# Patient Record
Sex: Male | Born: 1940 | Race: White | Hispanic: No | Smoking: Former smoker
Health system: Southern US, Community
[De-identification: ages and names within clinical notes are randomized; demographics above are authoritative.]

## PROBLEM LIST (undated history)

## (undated) DIAGNOSIS — D509 Iron deficiency anemia, unspecified: Secondary | ICD-10-CM

## (undated) DIAGNOSIS — Z923 Personal history of irradiation: Secondary | ICD-10-CM

## (undated) DIAGNOSIS — E785 Hyperlipidemia, unspecified: Secondary | ICD-10-CM

## (undated) DIAGNOSIS — I2510 Atherosclerotic heart disease of native coronary artery without angina pectoris: Secondary | ICD-10-CM

## (undated) DIAGNOSIS — G4700 Insomnia, unspecified: Secondary | ICD-10-CM

## (undated) DIAGNOSIS — N189 Chronic kidney disease, unspecified: Secondary | ICD-10-CM

## (undated) DIAGNOSIS — M109 Gout, unspecified: Secondary | ICD-10-CM

## (undated) DIAGNOSIS — E079 Disorder of thyroid, unspecified: Secondary | ICD-10-CM

## (undated) DIAGNOSIS — M549 Dorsalgia, unspecified: Secondary | ICD-10-CM

## (undated) DIAGNOSIS — Z972 Presence of dental prosthetic device (complete) (partial): Secondary | ICD-10-CM

## (undated) DIAGNOSIS — IMO0001 Reserved for inherently not codable concepts without codable children: Secondary | ICD-10-CM

## (undated) DIAGNOSIS — K219 Gastro-esophageal reflux disease without esophagitis: Secondary | ICD-10-CM

## (undated) DIAGNOSIS — I10 Essential (primary) hypertension: Secondary | ICD-10-CM

## (undated) DIAGNOSIS — E039 Hypothyroidism, unspecified: Secondary | ICD-10-CM

## (undated) DIAGNOSIS — M069 Rheumatoid arthritis, unspecified: Secondary | ICD-10-CM

## (undated) DIAGNOSIS — H30893 Other chorioretinal inflammations, bilateral: Secondary | ICD-10-CM

## (undated) DIAGNOSIS — H3093 Unspecified chorioretinal inflammation, bilateral: Secondary | ICD-10-CM

## (undated) DIAGNOSIS — G8929 Other chronic pain: Secondary | ICD-10-CM

## (undated) DIAGNOSIS — C801 Malignant (primary) neoplasm, unspecified: Secondary | ICD-10-CM

## (undated) DIAGNOSIS — F419 Anxiety disorder, unspecified: Secondary | ICD-10-CM

## (undated) DIAGNOSIS — H9190 Unspecified hearing loss, unspecified ear: Secondary | ICD-10-CM

## (undated) DIAGNOSIS — K759 Inflammatory liver disease, unspecified: Secondary | ICD-10-CM

## (undated) DIAGNOSIS — D649 Anemia, unspecified: Secondary | ICD-10-CM

## (undated) DIAGNOSIS — I209 Angina pectoris, unspecified: Secondary | ICD-10-CM

## (undated) DIAGNOSIS — G4730 Sleep apnea, unspecified: Secondary | ICD-10-CM

## (undated) DIAGNOSIS — K08109 Complete loss of teeth, unspecified cause, unspecified class: Secondary | ICD-10-CM

## (undated) HISTORY — PX: SKIN GRAFT: SHX250

## (undated) HISTORY — DX: Insomnia, unspecified: G47.00

## (undated) HISTORY — PX: MULTIPLE TOOTH EXTRACTIONS: SHX2053

## (undated) HISTORY — PX: TONSILLECTOMY: SUR1361

## (undated) HISTORY — DX: Hypothyroidism, unspecified: E03.9

## (undated) HISTORY — PX: FRACTURE SURGERY: SHX138

## (undated) HISTORY — PX: SKIN CANCER EXCISION: SHX779

## (undated) HISTORY — DX: Hyperlipidemia, unspecified: E78.5

---
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
## 2000-02-23 ENCOUNTER — Encounter: Payer: Self-pay | Admitting: Internal Medicine

## 2000-02-23 ENCOUNTER — Ambulatory Visit (HOSPITAL_COMMUNITY): Admission: RE | Admit: 2000-02-23 | Discharge: 2000-02-23 | Payer: Self-pay | Admitting: Internal Medicine

## 2000-03-16 ENCOUNTER — Ambulatory Visit (HOSPITAL_COMMUNITY): Admission: RE | Admit: 2000-03-16 | Discharge: 2000-03-16 | Payer: Self-pay | Admitting: *Deleted

## 2000-03-16 ENCOUNTER — Encounter (INDEPENDENT_AMBULATORY_CARE_PROVIDER_SITE_OTHER): Payer: Self-pay | Admitting: Specialist

## 2000-03-17 ENCOUNTER — Encounter: Payer: Self-pay | Admitting: *Deleted

## 2000-03-17 ENCOUNTER — Ambulatory Visit (HOSPITAL_COMMUNITY): Admission: RE | Admit: 2000-03-17 | Discharge: 2000-03-17 | Payer: Self-pay | Admitting: *Deleted

## 2000-05-02 ENCOUNTER — Encounter (INDEPENDENT_AMBULATORY_CARE_PROVIDER_SITE_OTHER): Payer: Self-pay

## 2000-05-02 ENCOUNTER — Ambulatory Visit (HOSPITAL_COMMUNITY): Admission: RE | Admit: 2000-05-02 | Discharge: 2000-05-02 | Payer: Self-pay | Admitting: *Deleted

## 2000-06-09 ENCOUNTER — Ambulatory Visit (HOSPITAL_COMMUNITY): Admission: RE | Admit: 2000-06-09 | Discharge: 2000-06-09 | Payer: Self-pay | Admitting: *Deleted

## 2000-06-09 ENCOUNTER — Encounter: Payer: Self-pay | Admitting: *Deleted

## 2000-08-15 ENCOUNTER — Encounter: Payer: Self-pay | Admitting: Specialist

## 2000-08-15 ENCOUNTER — Ambulatory Visit (HOSPITAL_COMMUNITY): Admission: RE | Admit: 2000-08-15 | Discharge: 2000-08-15 | Payer: Self-pay | Admitting: Specialist

## 2000-11-07 HISTORY — PX: CORONARY ANGIOPLASTY WITH STENT PLACEMENT: SHX49

## 2001-06-20 ENCOUNTER — Ambulatory Visit (HOSPITAL_COMMUNITY): Admission: RE | Admit: 2001-06-20 | Discharge: 2001-06-21 | Payer: Self-pay | Admitting: Cardiology

## 2001-06-30 ENCOUNTER — Emergency Department (HOSPITAL_COMMUNITY): Admission: EM | Admit: 2001-06-30 | Discharge: 2001-06-30 | Payer: Self-pay | Admitting: Emergency Medicine

## 2003-01-02 ENCOUNTER — Ambulatory Visit (HOSPITAL_COMMUNITY): Admission: RE | Admit: 2003-01-02 | Discharge: 2003-01-02 | Payer: Self-pay | Admitting: *Deleted

## 2003-01-02 ENCOUNTER — Encounter (INDEPENDENT_AMBULATORY_CARE_PROVIDER_SITE_OTHER): Payer: Self-pay | Admitting: Specialist

## 2003-08-14 ENCOUNTER — Encounter: Payer: Self-pay | Admitting: *Deleted

## 2003-08-14 ENCOUNTER — Ambulatory Visit (HOSPITAL_COMMUNITY): Admission: RE | Admit: 2003-08-14 | Discharge: 2003-08-14 | Payer: Self-pay | Admitting: *Deleted

## 2003-11-19 ENCOUNTER — Ambulatory Visit (HOSPITAL_COMMUNITY): Admission: RE | Admit: 2003-11-19 | Discharge: 2003-11-20 | Payer: Self-pay | Admitting: Otolaryngology

## 2005-03-29 ENCOUNTER — Encounter: Admission: RE | Admit: 2005-03-29 | Discharge: 2005-03-29 | Payer: Self-pay | Admitting: Internal Medicine

## 2005-04-11 ENCOUNTER — Encounter: Admission: RE | Admit: 2005-04-11 | Discharge: 2005-04-27 | Payer: Self-pay | Admitting: Internal Medicine

## 2005-10-19 ENCOUNTER — Emergency Department (HOSPITAL_COMMUNITY): Admission: EM | Admit: 2005-10-19 | Discharge: 2005-10-19 | Payer: Self-pay | Admitting: Emergency Medicine

## 2005-11-07 HISTORY — PX: CATARACT EXTRACTION W/ INTRAOCULAR LENS  IMPLANT, BILATERAL: SHX1307

## 2006-11-22 ENCOUNTER — Ambulatory Visit (HOSPITAL_COMMUNITY): Admission: RE | Admit: 2006-11-22 | Discharge: 2006-11-22 | Payer: Self-pay | Admitting: *Deleted

## 2010-11-27 ENCOUNTER — Inpatient Hospital Stay (HOSPITAL_COMMUNITY)
Admission: EM | Admit: 2010-11-27 | Discharge: 2010-12-01 | Payer: Self-pay | Source: Home / Self Care | Attending: Internal Medicine | Admitting: Internal Medicine

## 2010-11-29 NOTE — H&P (Addendum)
NAME:  Gary [name], Gary [name] NO.:  [id number]  MEDICAL RECORD NO.:  [id number]          [name] TYPE:  EMS  LOCATION:  ED                           FACILITY:  [facility]  PHYSICIAN:  Ruthy Dick, MD    DATE OF BIRTH:  [birth year]  DATE OF ADMISSION:  11/27/2010 DATE OF DISCHARGE:                             HISTORY & PHYSICAL   PRIMARY CARE PHYSICIAN:  Soyla Murphy. Pharr, MD  REASON FOR ADMISSION:  Fever and bilateral shoulder and neck pain for Gary past 1 week.  HISTORY OF PRESENTING ILLNESS:  Gary [name] is a 70 year old gentleman with a past medical history significant for coronary artery disease with history of stenting of Gary LAD, pseudogout, anxiety disorder, GERD, dyslipidemia, hypertension, hypothyroidism, and bronchitis who presented to Gary hospital with bilateral pain to both shoulders, right greater than left, and also some neck pain.  He also complains of some back pain during this.  He says this pain is similar to Gary pain that he had when he had a flare-up of his pseudogout about a couple of years ago.  He says Gary pain was relatively okay in Gary last week, but as of Gary last day or so Gary intensity has gone to about 10/10 and he is unable to lift Gary right arm at Gary shoulder joint, though he can bend his neck to his chest, this is with a lot of pain.  He also says that he has developed fever within this time.  He admits to having had some cough, but no sputum production during this time.  He denies shortness of breath. Denies chest pain.  Denies dysuria, frequency, and urgency.  Denies palpitations.  Also denies diplopia, photophobia.  Gary [name] was seen in Gary emergency room because of fever, which has gone up to as high as 101, [facility] hospitalist was invited to help with Gary management.  On seeing Gary [name], he says that he is okay except for his bilateral shoulder pain and some neck pain.  He is also oriented to time, place, and person, and able to  answer most of Gary questions that are asked of him.  He denies having traveled anywhere recently.  PAST MEDICAL HISTORY: 1. History of pseudogout. 2. Anxiety disorder. 3. Gastroesophageal reflux disease. 4. Dyslipidemia. 5. Hypertension. 6. Hypothyroidism. 7. Coronary artery disease with history of stenting.  MEDICATIONS:  Gary [name] is on, 1. Zocor 40 mg daily. 2. Zoloft 100 mg b.i.d. 3. Zovirax 400 mg b.i.d. 4. Synthroid 200 mcg daily. 5. Bisoprolol 5 mg once daily. 6. Lovaza 1 g once daily. 7. Prilosec 20 mg daily. 8. Oxycodone 15 mg as needed. 9. Valium 5 mg as needed. 10.Ambien 10 mg once a day. 11.Folic acid 1 mg twice a day. 12.Meclizine 25 mg t.i.d. 13.Kenalog. 14.Aspirin 81 mg daily. 15.Glucosamine 1500 mg twice daily. 16.Calcium 500 mg b.i.d. 17.MetroGel 1% twice daily.  ALLERGIES: 1. METHOTREXATE. 2. NEOSPORIN. 3. ULTRAM.  SOCIAL HISTORY:  Lives at home.  Denies alcohol and illicit drug use. Said he quit smoking about 20 years ago.  FAMILY HISTORY:  Significant for a brother who had coronary  artery disease at Gary age of 76 and his father also had coronary artery disease with stenting in his 84s.  Otherwise, there is cancer in Gary family; according to him, he was unable to specify what type.  REVIEW OF SYSTEMS:  All systems reviewed, but negative except noted in Gary history of presenting illness.  PHYSICAL EXAMINATION:  GENERAL:  Gary [name] seen and examined in Gary emergency room.  He is alert and oriented x3, in no acute cardiopulmonary distress, no painful distress either. VITAL SIGNS:  Temperature is 100.7 at first and then 101, pulse is 76, respirations 16, blood pressure 166/88, and saturating 94% on room air. HEENT:  Normocephalic, atraumatic.  Pupils equal, round, reactive to light.  Extraocular muscles intact.  Nares patent. NECK:  Supple.  No JVD.  No lymphadenopathy.  No thyromegaly. CHEST:  Bilateral congestion, probably more rhonchi than  crackles. ABDOMEN:  Soft, nontender.  No hepatosplenomegaly. EXTREMITIES:  No clubbing.  No cyanosis.  No edema. CARDIOVASCULAR SYSTEM:  First and second heart sounds only. CENTRAL NERVOUS SYSTEM:  Nonfocal.  Cranial nerves intact II through XII.  Gary [name] has normal reflexes and power is 5/5 globally.  Both shoulder joints are tender to palpation, but there is no evidence of fluctuance or effusion.  Gary right shoulder is more tender than Gary left shoulder.  Range of motion in Gary right shoulder is limited because of pain.  Same for Gary left side as well, but again this is more in Gary right and left.  There is also some tenderness in Gary neck joints, but again Gary [name] is able to move Gary neck up and down.  Gary neck is not stiff.  There is also reduced motion due to pain.  LABORATORY DATA AND INVESTIGATIONS:  Chest x-ray is read as having no acute abnormalities.  X-ray of Gary shoulder on Gary left side shows degenerative changes, no acute changes on Gary left shoulder.  X-ray of Gary right shoulder not done.  WBC is 14.5, hemoglobin 12, hematocrit 34, platelet count 310 with 89% neutrophils.  ESR is 116.  Prothrombin time is 18, INR is 1.54.  Sodium 137, potassium 4.1, glucose is 128, BUN 23, creatinine 1.49.  Albumin is 2.6.  Tylenol level is less than 10. Urinalysis shows no clear evidence of urinary tract infection.  ASSESSMENT: 1. Fever of unknown origin at this point. 2. Multiple joint pains (arthralgias), questionable for pseudogout     exacerbation. 3. History of pseudogout. 4. Coronary artery disease with history of stenting. 5. Anxiety disorder. 6. Gastroesophageal reflux disease. 7. Leukocytosis. 8. Dyslipidemia. 9. Hypertension. 10.Hypothyroidism. 11.Acute bronchitis.  PLAN OF CARE:  Gary [name] will be admitted and treated as a possible pseudogout at this point.  We will start Gary [name] with nonsteroidal anti-inflammatory medications.  We will also give him a  dose of steroids and this will be repeated by Gary rounding attending if Gary [name] has improvement on this and NSAIDS not helping, we would then also have this [name] on DVT prophylaxis.  I would empirically also start this [name] on Avelox for presumed bronchitis.  For some reason, I do not have a serum uric acid level on this [name] and we will order this and we would restart his outpatient medications.  Of note is Gary fact that this [name] does not seem to have any joint effusion, so there is no room for ordering any ortho consult for arthrocentesis.  Plan of care discussed with this [name] at length.  He is in agreement and plans to comply.  Total time used for admitting Gary [name] is 1 hour.     Ruthy Dick, MD     GU/MEDQ  D:  11/27/2010  T:  11/27/2010  Job:  [id number]  cc:   Soyla Murphy. Renne Crigler, M.D. Fax: [phone]  Electronically Signed by Ruthy Dick  on 11/29/2010 12:36:39 AM

## 2010-11-29 NOTE — Discharge Summary (Addendum)
NAMEMURL, GOLLADAY NO.:  [id number]  MEDICAL RECORD NO.:  [id number]          PATIENT TYPE:  INP  LOCATION:  1344                         FACILITY:  [facility]  PHYSICIAN:  Talmage Nap, MD  DATE OF BIRTH:  [birth year]  DATE OF ADMISSION:  11/27/2010 DATE OF DISCHARGE:                        DISCHARGE SUMMARY - REFERRING   PRIMARY CARE PHYSICIAN:  Dr. Merri Brunette.  DISCHARGE DIAGNOSES: 1. Bilateral shoulder pain, questionable reactive arthritis. 2. Leukocytosis atiology is unknown. 3. History of pseudogout. 4. Anxiety disorder. 5. Coronary artery disease, status post stent. 6. Gastroesophageal reflux disease. 7. Dyslipidemia. 8. Hypertension. 9. Hypothyroidism. 10.Prerenal azotemia. 11.Acute bronchitis. 12.Urinary tract infection.  The patient is a 70 year old Caucasian male with history of coronary artery disease, status post stent and gout, was admitted to the hospital on November 27, 2010 by Dr. Ruthy Dick with complaints of bilateral shoulder pain with fever.  He however, denied any history of shortness of breath.  He denied any chest pain.  He denied any PND or orthopnea and subsequently, presented to the emergency room to be evaluated.  PREADMISSION MEDICATIONS: 1. Zocor 40 mg p.o. daily. 2. Zoloft 100 mg p.o. b.i.d. 3. Zovirax 400 mg p.o. b.i.d. 4. Synthroid 200 mcg p.o. daily. 5. Bisoprolol 5 mg p.o. once a day. 6. Lovaza 1 g p.o. daily. 7. Prilosec 20 mg p.o. daily. 8. Oxycodone 15 mg p.o. p.r.n. 9. DuoNeb 5 mg p.o. p.r.n. 10.Ambien 10 mg p.o. daily. 11.Folic acid 1 mg p.o. b.i.d. 12.Meclizine 25 mg p.o. t.i.d. 13.Kenalog. 14.Aspirin 81 mg p.o. daily. 15.Glucosamine 1500 mg twice a day. 16.Calcium 500 mg p.o. b.i.d. 17.MetroGel 1% twice a day.  ALLERGIES: 1. METHOTREXATE. 2. NEOSPORIN. 3. ULTRAM.  SOCIAL HISTORY:  The patient lives at home with his spouse.  No history of alcohol or tobacco use.  FAMILY HISTORY:   Positive for coronary artery disease.  REVIEW OF SYSTEMS:  Essentially as documented in initial history and physical.  PHYSICAL EXAMINATION:  GENERAL:  At that time, the patient was seen by the admitting physician and he was said to be awake, alert, and oriented, not in any acute distress. VITAL SIGNS:  Temperature is 100.7 and thereafter, repeat was 101, pulse 76, respiratory rate is 16, blood pressure is 162/88, and saturating 94% on room air. HEENT:  Pupils are reactive to light.  Extraocular muscles are intact. NECK:  No jugular venous distention.  No carotid bruit.  No lymphadenopathy. CHEST:  Chest was said to have showed bilateral congestion with rhonchi and rales. HEART:  Heart sounds are 1 and 2. ABDOMEN:  Soft and nontender.  Liver, spleen, and kidney not palpable. Bowel sounds are positive. EXTREMITIES:  No pedal edema. NEUROLOGIC:  Did not show any lateralizing signs. NEUROPSYCHIATRIC:  Unremarkable. MUSCULOSKELETAL SYSTEM:  Showed tenderness in the right shoulder with decreased range of movement.  LABORATORY DATA:  Initial complete blood count differential showed WBC of 15.5.  Initial complete blood count differential showed WBC of 14.5, hemoglobin of 12.3, hematocrit of 34.5, MCV of 91.8 with a platelet count of 310.  Coagulation profile showed PT 18.7, INR 1.54, and APTT of 47.  Comprehensive metabolic panel showed sodium of 137, potassium of 4.1, chloride of 100 with a bicarbonate of 28.  Glucose is 128, BUN is 23, and creatinine is 1.49.  ESR 116.  Acetaminophen level less than 10. Urinalysis unremarkable.  Urine microscopy showed wbc's 2 to 6.  Uric acid level is 4.6.  TSH is 1.455.  A repeat complete blood count with no differential done on November 28, 2010, showed WBC of 10.5, hemoglobin of 11.2, hematocrit 32.3, and MCV 91.2, and the platelet count of 273. Basic metabolic panel showed sodium of 140, potassium of 4.3, chloride of 104, bicarbonate of 28, and  glucose is 153.  BUN 27, creatinine is 1.60, and uric acid level 5.0.  Lipid panel unremarkable.  RPR nonreactive.  C-reactive protein 25.4.  Rheumatoid factor 17.  A repeat complete blood count with differential done on November 29, 2010, showed WBC of 17.6, hemoglobin of 10.5, hematocrit of 30.4, MCV of 90.1, and platelet count of 328.  Magnesium level is 2.2.  Comprehensive metabolic panel showed sodium of 138, potassium of 3.9, chloride of 105 with a bicarbonate of 24, glucose is 123, BUN is 55, and creatinine is 2.31. Blood culture negative x2.  ANA negative.  Imaging studies done include x-ray of the left shoulder, which showed degenerative changes.  No acute posterior sliding seen.  Chest x-ray, normal and x-ray of the right shoulder showed faint calcification in the acromioclavicular joint, this clavicular involvement by gout.  HOSPITAL COURSE:  The patient was admitted to regular floor.  He was started on half-normal saline to go at rate of 60 cc an hour, Motrin 800 mg p.o. q.8 h. and pain control was done with morphine 2 mg IV q.4 h. p.r.n.  Also added to the patient's regimen was Solu-Medrol 125 mg IV x1 dose and Avelox 400 mg IV q.24 h.  Other medication, given to the patient include Zovirax 400 mg p.o. b.i.d., aspirin 81 mg p.o. b.i.d., bisoprolol 7.5 mg p.o. q.h.s., calcium carbonate 1 p.o.  He was given folic acid 2 mg p.o. daily, Synthroid 200 mcg p.o. daily and metronidazole gel apply topical b.i.d.  DVT prophylaxis will be heparin 40 mg subcutaneously q.12 h. and Protonix 40 mg p.o. b.i.d. for GI prophylaxis.  The patient was seen by me for the very first time in this admission on November 28, 2010 and in this account, the patient claimed he felt better and there was improved range of movement in the right shoulders and during the examination, the patient was essentially unremarkable.  So at this point, the patient was continued on the initial medication that was  initiated on admission.  He was reevaluated by me today, which is November 29, 2010.  He also feels better. Reevaluation of his chemistry showed markedly increased BUN and creatinine.  So the plan at this point is to discontinue ibuprofen and we will subsequently monitor the patient's BUN and creatinine.  The patient will be followed and evaluated on daily basis.     Talmage Nap, MD     CN/MEDQ  D:  11/29/2010  T:  11/29/2010  Job:  [id number]  Electronically Signed by Talmage Nap  on 11/29/2010 07:39:21 PM

## 2010-11-30 LAB — COMPREHENSIVE METABOLIC PANEL
ALT: 14 U/L (ref 0–53)
ALT: 76 U/L — ABNORMAL HIGH (ref 0–53)
AST: 139 U/L — ABNORMAL HIGH (ref 0–37)
Alkaline Phosphatase: 70 U/L (ref 39–117)
BUN: 23 mg/dL (ref 6–23)
CO2: 28 mEq/L (ref 19–32)
CO2: 24 mEq/L (ref 19–32)
Calcium: 9.2 mg/dL (ref 8.4–10.5)
Chloride: 100 mEq/L (ref 96–112)
GFR calc Af Amer: 34 mL/min — ABNORMAL LOW (ref >60)
GFR calc non Af Amer: 28 mL/min — ABNORMAL LOW (ref >60)
Glucose, Bld: 128 mg/dL — ABNORMAL HIGH (ref 70–99)
Potassium: 4.1 mEq/L (ref 3.5–5.1)
Potassium: 3.9 mEq/L (ref 3.5–5.1)
Sodium: 137 mEq/L (ref 135–145)
Sodium: 138 mEq/L (ref 135–145)
Total Bilirubin: 0.8 mg/dL (ref 0.3–1.2)
Total Protein: 7.8 g/dL (ref 6.0–8.3)

## 2010-11-30 LAB — DIFFERENTIAL
Basophils Absolute: 0 10*3/uL (ref 0.0–0.1)
Basophils Absolute: 0 10*3/uL (ref 0.0–0.1)
Basophils Relative: 0 % (ref 0–1)
Eosinophils Relative: 0 % (ref 0–5)
Lymphocytes Relative: 5 % — ABNORMAL LOW (ref 12–46)
Lymphs Abs: 0.7 10*3/uL (ref 0.7–4.0)
Monocytes Absolute: 0.9 10*3/uL (ref 0.1–1.0)
Monocytes Absolute: 0.9 10*3/uL (ref 0.1–1.0)
Monocytes Relative: 7 % (ref 3–12)
Neutro Abs: 12.9 10*3/uL — ABNORMAL HIGH (ref 1.7–7.7)
Neutro Abs: 15.3 10*3/uL — ABNORMAL HIGH (ref 1.7–7.7)
Neutrophils Relative %: 87 % — ABNORMAL HIGH (ref 43–77)

## 2010-11-30 LAB — URIC ACID
Uric Acid, Serum: 4.6 mg/dL (ref 4.0–7.8)
Uric Acid, Serum: 5 mg/dL (ref 4.0–7.8)

## 2010-11-30 LAB — BASIC METABOLIC PANEL
BUN: 27 mg/dL — ABNORMAL HIGH (ref 6–23)
Calcium: 9.7 mg/dL (ref 8.4–10.5)
Creatinine, Ser: 1.6 mg/dL — ABNORMAL HIGH (ref 0.4–1.5)
GFR calc Af Amer: 52 mL/min — ABNORMAL LOW (ref >60)
GFR calc non Af Amer: 43 mL/min — ABNORMAL LOW (ref >60)

## 2010-11-30 LAB — CBC
HCT: 34.5 % — ABNORMAL LOW (ref 39.0–52.0)
Hemoglobin: 12.3 g/dL — ABNORMAL LOW (ref 13.0–17.0)
Hemoglobin: 10.5 g/dL — ABNORMAL LOW (ref 13.0–17.0)
MCH: 32.7 pg (ref 26.0–34.0)
MCH: 31.6 pg (ref 26.0–34.0)
MCHC: 35.7 g/dL (ref 30.0–36.0)
MCHC: 34.5 g/dL (ref 30.0–36.0)
MCV: 91.8 fL (ref 78.0–100.0)
MCV: 91.2 fL (ref 78.0–100.0)
Platelets: 273 10*3/uL (ref 150–400)
RBC: 3.37 MIL/uL — ABNORMAL LOW (ref 4.22–5.81)
RDW: 12.3 % (ref 11.5–15.5)
RDW: 12.3 % (ref 11.5–15.5)
WBC: 10.5 10*3/uL (ref 4.0–10.5)
WBC: 17.6 10*3/uL — ABNORMAL HIGH (ref 4.0–10.5)

## 2010-11-30 LAB — SEDIMENTATION RATE: Sed Rate: 116 mm/hr — ABNORMAL HIGH (ref 0–16)

## 2010-11-30 LAB — URINALYSIS, ROUTINE W REFLEX MICROSCOPIC
Ketones, ur: NEGATIVE mg/dL
Nitrite: NEGATIVE
Specific Gravity, Urine: 1.028 (ref 1.005–1.030)
Urobilinogen, UA: 1 mg/dL (ref 0.0–1.0)

## 2010-11-30 LAB — ACETAMINOPHEN LEVEL: Acetaminophen (Tylenol), Serum: <10 ug/mL — ABNORMAL LOW (ref 10–30)

## 2010-11-30 LAB — URINE MICROSCOPIC-ADD ON

## 2010-11-30 LAB — LIPID PANEL: Cholesterol: 130 mg/dL (ref 0–200)

## 2010-11-30 LAB — RPR: RPR Ser Ql: NONREACTIVE

## 2010-11-30 LAB — RHEUMATOID FACTOR: Rhuematoid fact SerPl-aCnc: 17 IU/mL — ABNORMAL HIGH (ref <=14)

## 2010-11-30 LAB — C-REACTIVE PROTEIN: CRP: 25.4 mg/dL — ABNORMAL HIGH (ref <0.6)

## 2010-12-01 LAB — COMPREHENSIVE METABOLIC PANEL
ALT: 50 U/L (ref 0–53)
AST: 50 U/L — ABNORMAL HIGH (ref 0–37)
Albumin: 2.3 g/dL — ABNORMAL LOW (ref 3.5–5.2)
BUN: 47 mg/dL — ABNORMAL HIGH (ref 6–23)
CO2: 25 mEq/L (ref 19–32)
Calcium: 9 mg/dL (ref 8.4–10.5)
Chloride: 110 mEq/L (ref 96–112)
Creatinine, Ser: 2.43 mg/dL — ABNORMAL HIGH (ref 0.4–1.5)
GFR calc Af Amer: 42 mL/min — ABNORMAL LOW (ref >60)
GFR calc non Af Amer: 27 mL/min — ABNORMAL LOW (ref >60)
Sodium: 143 mEq/L (ref 135–145)
Total Bilirubin: 0.5 mg/dL (ref 0.3–1.2)
Total Protein: 5.8 g/dL — ABNORMAL LOW (ref 6.0–8.3)

## 2010-12-01 LAB — CBC
HCT: 30.3 % — ABNORMAL LOW (ref 39.0–52.0)
MCH: 31.9 pg (ref 26.0–34.0)
MCHC: 35.1 g/dL (ref 30.0–36.0)
MCHC: 34.3 g/dL (ref 30.0–36.0)
MCV: 90.9 fL (ref 78.0–100.0)
MCV: 90.7 fL (ref 78.0–100.0)
Platelets: 454 10*3/uL — ABNORMAL HIGH (ref 150–400)
RBC: 3.83 MIL/uL — ABNORMAL LOW (ref 4.22–5.81)
RDW: 12.9 % (ref 11.5–15.5)

## 2010-12-01 LAB — DIFFERENTIAL
Eosinophils Relative: 1 % (ref 0–5)
Eosinophils Relative: 3 % (ref 0–5)
Lymphocytes Relative: 34 % (ref 12–46)
Lymphocytes Relative: 33 % (ref 12–46)
Lymphs Abs: 3.8 10*3/uL (ref 0.7–4.0)
Lymphs Abs: 1.9 10*3/uL (ref 0.7–4.0)
Monocytes Absolute: 0.7 10*3/uL (ref 0.1–1.0)
Monocytes Absolute: 0.5 10*3/uL (ref 0.1–1.0)
Monocytes Relative: 9 % (ref 3–12)

## 2010-12-01 LAB — T3: T3, Total: 14.7 ng/dl — ABNORMAL LOW (ref 80.0–204.0)

## 2010-12-02 NOTE — Discharge Summary (Addendum)
  [name], Gary [name] NO.:  [id number]  MEDICAL RECORD NO.:  [id number]          PATIENT TYPE:  INP  LOCATION:  1344                         [facility]ACILITY:  [facility]  PHYSICIAN:  Rock Nephew, MD       DATE OF BIRTH:  09-Jun-1941  DATE OF ADMISSION:  11/27/2010 DATE OF DISCHARGE:  12/01/2010                        DISCHARGE SUMMARY - REFERRING   ADDENDUM: This is an addendum to the discharge summary dictated by Dr. Beverly Gust on November 29, 2010.  The patient's discharge medications are as follows: Avelox 400 mg p.o. daily; bisoprolol 5 mg p.o. daily; prednisone taper 30 mg for 2 days, 20 mg for 2 days, 10 mg for 2 days and then stop; Ambien 10 mg p.o. q.h.s.; aspirin 81 mg p.o. daily; calcium carbonate/vitamin D 1 tablet by mouth twice daily; folic acid 2 mg by mouth daily, glucosamine 1500 mg by mouth twice a daily; Lovaza 1 capsule by mouth twice daily; meclizine 25 mg 1 tablet by mouth twice daily as needed; metronidazole topical given one application topically twice daily; oxycodone 15 mg 1 tablet by mouth three times a day as needed for pain, Prilosec 20 mg p.o. twice daily; Synthroid 200 mcg 1 tablet by mouth every morning; triamcinolone 1 application topically as needed; Valium 5 mg 2 tablets by mouth daily at bedtime as needed for sleep; Zocor 40 mg p.o. daily at bedtime; Zoloft 100 mg 2 tablets by mouth daily at bedtime.  DISPOSITION:  The patient discharged home with home health PT, OT and RN, rolling walker.  The patient should follow up with Dr. Merri Brunette within 1 week.  The patient should also have appointment to see a rheumatologist.  The patient's vitals at the time of discharge were temperature 97.8,  pulse 65, respiratory rate 18, blood pressure 150/74, 95% on room air.  Please note that this is not an official document until electronically signed.     Rock Nephew, MD     NH/MEDQ  D:  12/01/2010  T:  12/01/2010  Job:  [id number]  cc:    Soyla Murphy. Renne Crigler, M.D. Fax: [phone]  Electronically Signed by Rock Nephew MD on 12/02/2010 10:24:43 AM

## 2010-12-04 LAB — CULTURE, BLOOD (ROUTINE X 2)
Culture  Setup Time: 201201220008
Culture: NO GROWTH

## 2011-02-03 ENCOUNTER — Other Ambulatory Visit: Payer: Self-pay | Admitting: Dermatology

## 2011-03-25 NOTE — Op Note (Signed)
NAMEMarland Kitchen  CHIMA, ASTORINO NO.:  [id number]   MEDICAL RECORD NO.:  [id number]          PATIENT TYPE:  AMB   LOCATION:  ENDO                         FACILITY:  [facility]   PHYSICIAN:  Georgiana Spinner, M.D.    DATE OF BIRTH:  [birth year]   DATE OF PROCEDURE:  11/22/2006  DATE OF DISCHARGE:                               OPERATIVE REPORT   PROCEDURE:  Upper endoscopy.   INDICATIONS:  GERD.   ANESTHESIA:  Fentanyl 75 mcg, Versed 7 mg.   PROCEDURE:  With the patient mildly sedated in the left lateral  decubitus position, the Pentax videoscopic endoscope was inserted in the  mouth and passed under direct vision through the pharynx.  I could not  advance further and elected therefore to terminate the procedure.  The  patient's vital signs and pulse oximeter remained stable.  The patient  tolerated the procedure well without apparent complications.   FINDINGS:  As above.   PLAN:  Proceed to colonoscopy and will defer further studies at this  time.           ______________________________  Georgiana Spinner, M.D.     GMO/MEDQ  D:  11/22/2006  T:  11/22/2006  Job:  [id number]

## 2011-03-25 NOTE — Procedures (Signed)
[facility]  Patient:    Gary [name], Gary [name]                         MRN: [id number] Proc. Date: 03/16/00 Adm. Date:  [id number] Disc. Date: [id number] Attending:  Sabino Gasser                           Procedure Report  PROCEDURE:  Upper endoscopy with biopsy.  ENDOSCOPIST:  Sabino Gasser, M.D.  INDICATIONS:  Abdominal pain.  ANESTHESIA:  Demerol 80 mg, Versed 8 mg given intravenously in divided dose.  DESCRIPTION OF PROCEDURE:  With the patient mildly sedated in the left lateral decubitus position, the Olympus videoscopic endoscope was inserted into the mouth and passed under direct vision through the esophagus which appeared normal, into the stomach.  The fundus, body, and antrum were visualized and appeared normal,  other than antral erythema.  It was fairly extensive, mild to moderate. Photographed and biopsied.  We entered into the duodenal bulb, second portion of the duodenum, both of which appeared normal and were photographed.  From this point the endoscope was slowly withdrawn, taking circumferential views of the entire duodenal mucosa.  The endoscope was then pulled back into the stomach and placed in retroflexion to view the stomach from below, which appeared normal.  There was slight relaxation of the GE junction around the endoscope, which might lend itself to reflux.  The endoscope was then straightened and pulled back from distal to proximal stomach, taking circumferential views of the entire gastric, and subsequently the esophageal mucosa, which otherwise appeared normal.  The patients vital signs, pulse oximeter remained stable.  The patient tolerated the procedure well without apparent complications.  FINDINGS:  Changes of gastritis.  Await biopsy report.  The patient will call me for the results, and follow up with me as an outpatient. A CAT scan is scheduled for tomorrow. DD:  03/16/00 TD:  03/18/00 Job: [id number]

## 2011-03-25 NOTE — Cardiovascular Report (Signed)
[place]. [facility]  Patient:    Gary [name], Gary [name]                         MRN: [id number] Proc. Date: 06/20/01 Adm. Date:  [id number] Attending:  Silvestre Mesi CC:         Soyla Murphy. Renne Crigler, M.D.  [facility]   Cardiac Catheterization  REFERRING PHYSICIAN:  Dr. Soyla Murphy. Pharr.  PROCEDURES: 1. Left heart catheterization. 2. Coronary cineangiography. 3. Left internal mammary artery cineangiography. 4. Left ventricular cineangiography. 5. Angioplasty with primary stent placement in the proximal left anterior    descending. 6. Perclose of the right femoral artery.  INDICATION FOR PROCEDURES:  This 70 year old male presented with recent onset of exertional angina which began four months prior to this admission but recently accelerated, leading to a treadmill exercise tolerance test which was early double-positive for myocardial ischemia; he was then scheduled for a cardiac catheterization and possible angioplasty.  DESCRIPTION OF PROCEDURE:  After signing an informed consent, the patient was premedicated with Benadryl 50 mg intravenously and brought to the cardiac catheterization lab.  His right groin was prepped and draped in a sterile fashion and anesthetized locally with 1% lidocaine.  A 6-French introducer sheath was inserted percutaneously into the right femoral artery.  The 6-French #4 Judkins coronary catheters were used to make injections into the native coronary arteries.  The right coronary catheter was used to make an injection into the left subclavian to visualize the left internal mammary artery.  A 6-French pigtail catheter was used to measure pressures in the left ventricle and aorta and to make a midstream injection into the left ventricle. After noting a critical, greater than 95% focal stenosis in the proximal LAD, we discussed this finding with the patient and elected to proceed with an angioplasty procedure; we also noted a  moderate stenosis in his distal right coronary artery, which was also shown to the patient, and our recommendation was to continue to treat it medically without intervention at this time.  We selected a 6-French JL4 guide catheter, which was advanced to the root of the aorta.  After engaging the tip of this catheter in the ostium of the left coronary artery, a short Hi-Torque Floppy guidewire was advanced through the guide catheter and into the LAD.  After crossing the proximal lesion and sizing the vessel and length of the lesion, the guidewire was then advanced into the distal segment of the LAD.  We then chose a 3.0 x 15.0-mm Penta Multilink stent deployment system which was advanced over the guidewire and positioned within the proximal LAD lesion.  The stent was then employed with two inflations, the first at 16 atmospheres for 23 second and the second at 18 atmospheres for 24 seconds.  After the second inflation of the balloon, the deployment balloon was removed and injections again into the left coronary artery showed an excellent angiographic result, with 0% residual lesion and no evidence for dissection or clot.  There was normal antegrade flow.  The patient tolerated the procedure well and no complications were noted.  At the end of the procedure, the catheter and sheath were removed from the right femoral artery and hemostasis was easily obtained with a Perclose closure system.  He was then admitted to the [facility] for further monitoring and continuation of Integrilin drip with plans for discharge in the a.m.  MEDICATIONS GIVEN:  Heparin 4300 units IV, Integrilin drip  per pharmacy protocol.  HEMODYNAMIC DATA:  Left ventricular pressure 122/0-9; aortic pressure 120/69 with a mean of 89.  Left ventricular ejection fraction 70%.  CINE FINDINGS: Coronary cineangiography: 1. Left coronary artery:  The ostium and left main appear normal. 2. Left anterior descending:  The LAD has a focal  concentric 95% lesion in its    proximal segment; this is preceded with a short segment of approximately    10 mm in length of a 50% stenosis.  Very proximal 5-mm segment of the LAD    appeared normal.  The segment just distal to the 95% stenosis appears    normal.  There was no large diagonal or septal branch arising from the area    of the stenosis.  There was slow antegrade flow.  The middle and distal    segment of the LAD appears normal without significant plaque and with    runoff distally antegrade.  The first large diagonal branch arises distal    to the critical lesion and appears normal. 3. Circumflex coronary artery appears normal. 4. Right coronary artery:  The right coronary artery is a medium-size vessel    but the ostium appears normal.  The proximal and middle segments appear    normal.  There is a focal eccentric 50% stenosis in the distal segment just    prior to the turn of the vessel into the posterior descending distribution.    There is normal antegrade flow.  The right coronary artery terminates with    this posterior descending distribution. 5. Left internal mammary artery appears normal.  Left ventricular cineangiogram:  The left ventricular chamber size and contractility appear normal.  The left ventricular wall thickness appears normal.  The overall left ventricular contractility is normal without segmental abnormality.  The ejection fraction was estimated at 70%.  The mitral and aortic valves appear normal.  Angioplasty cines:  Cines taken during the angioplasty procedure show proper positioning of the guidewire and stent deployment system.  Followup cines showed an excellent angiographic result with proper positioning of the stent and 0% residual lesion.  There was no evidence for dissection or clot.  There was normal antegrade flow.  FINAL DIAGNOSES: 1. Single-vessel coronary artery disease with critical, greater than 95% focal    stenosis in the proximal  left anterior descending.  2. Moderate stenosis, distal right coronary artery. 3. Normal left ventricular function, ejection fraction 70%. 4. Normal mitral and aortic valves. 5. Normal left internal mammary artery. 6. Successful primary stent placement in the proximal left anterior descending    lesion. 7. Successful Perclose of the right femoral artery.  DISPOSITION:  The patient was admitted to the [facility] for continuation of his Integrilin drip and will anticipate discharge in the a.m. DD:  06/20/01 TD:  06/20/01 Job: [id number]

## 2011-03-25 NOTE — Procedures (Signed)
[facility]  Patient:    Gary [name], Gary [name]                         MRN: [id number] Adm. Date:  [id number] Attending:  Sabino Gasser                           Procedure Report  PROCEDURE:  Colonoscopy with polypectomy and biopsy.  ENDOSCOPIST:  Sabino Gasser, M.D.  INDICATIONS:  Colon polyp and abdominal pain.  ANESTHESIA:  Demerol 125 mg and Versed 12 mg were given intravenously in divided dose.  DESCRIPTION OF PROCEDURE:  With patient mildly sedated in the left lateral decubitus position, the Olympus videoscopic colonoscope was inserted in the rectum, after a normal rectal exam, and passed under direct vision to the cecum, cecum identified by ileocecal valve and appendiceal orifice, both of which were photographed.  We entered into the terminal ileum via the ileocecal valve; this too appeared normal and was photographed.  Upon exploring the cecum then further, after withdrawing the colonoscope from the terminal ileum, the area of the appendiceal orifice was explored and appeared to be villous, almost adenomatous, and this was biopsied to rule out adenomatous polyp in a sheet in this area.  Once accomplished, the endoscope was then withdrawn, taking circumferential views of the entire colonic mucosa, stopping first in the ascending colon where the polyp was seen, photographed and using snare-cautery technique, setting of 20/20 blended current, it was removed. The colonoscope was then withdrawn all the way to the rectum, after stopping in the sigmoid area where a small polyp was seen, photographed and removed using hot-biopsy-forceps technique, again at a setting of 20/20 blended current.  In the rectum, the endoscope was placed in retroflexion to view the anal canal from above; a small internal hemorrhoid was seen and photographed. The endoscope was straightened and withdrawn through the anal canal, which otherwise appeared normal.  Patients vital signs and  pulse oximetry remained stable.  Patient tolerated the procedure well without apparent complications.  FINDINGS:  Small polyp in the sigmoid colon -- that is polyp #2, a sheet of colon polypoid tissue in bottle #1, both biopsied; #3 was a polyp of the ascending colon; #4, a polyp of the sigmoid colon area.  Other finding was an internal hemorrhoid; otherwise, unremarkable colonoscopic examination to the terminal ileum.  PLAN:  We will have patient follow up with me as an outpatient. DD:  05/02/00 TD:  05/03/00 Job: [id number]

## 2011-03-25 NOTE — Op Note (Signed)
NAMEMarland [name]  MAGDIEL, BARTLES NO.:  [id number]   MEDICAL RECORD NO.:  [id number]          PATIENT TYPE:  AMB   LOCATION:  ENDO                         FACILITY:  [facility]   PHYSICIAN:  Georgiana Spinner, M.D.    DATE OF BIRTH:  [birth year]   DATE OF PROCEDURE:  DATE OF DISCHARGE:                               OPERATIVE REPORT   PROCEDURE:  Colonoscopy.   INDICATIONS:  Colon polyps.   ANESTHESIA:  None further given.   PROCEDURE:  With the patient mildly sedated in the left lateral  decubitus position, a rectal examination was performed, which was  unremarkable to my exam.  Subsequently, the Pentax videoscopic  colonoscope was inserted through the rectum and passed under direct  vision to the cecum, identified by ileocecal valve and appendiceal  orifice, both of which were photographed.  From this point, the  colonoscope was slowly withdrawn, taking circumferential views of  colonic mucosa, stopping in the rectum, which appeared normal and showed  hemorrhoids on retroflexed view.  The endoscope was straightened and  withdrawn.  The patient's vital signs and pulse oximeter remained  stable.  The patient tolerated the procedure well with no apparent  complications.   FINDINGS:  Internal hemorrhoids, otherwise an unremarkable examination.   PLAN:  Repeat examination in 5 years due to patient's previous history  of polyp disease.           ______________________________  Georgiana Spinner, M.D.     GMO/MEDQ  D:  11/22/2006  T:  11/22/2006  Job:  [id number]

## 2011-03-25 NOTE — Op Note (Signed)
NAMELEIB, ELAHI NO.:  [id number]   MEDICAL RECORD NO.:  [id number]                   PATIENT TYPE:  OIB   LOCATION:  2550                                 FACILITY:  [facility]   PHYSICIAN:  Suzanna Obey, M.D.                    DATE OF BIRTH:  May 14, 1941   DATE OF PROCEDURE:  11/19/2003  DATE OF DISCHARGE:                                 OPERATIVE REPORT   PREOPERATIVE DIAGNOSIS:  Zenker's diverticulum.   POSTOPERATIVE DIAGNOSIS:  Zenker's diverticulum.   OPERATION PERFORMED:  Endoscopic diverticulectomy.   SURGEON:  Suzanna Obey, M.D.   ANESTHESIA:  General endotracheal.   ESTIMATED BLOOD LOSS:  Less than 5 mL.   INDICATIONS FOR PROCEDURE:  The patient is a 70 year old who has had  significant problems with dysphagia and food collecting with reaspiration.  He has barium swallow which showed a large Zenker's diverticulum.  He has a  very tight cricopharyngeus muscle.  He was informed of the risks and  benefits of the procedure including bleeding, infection, perforation of the  esophagus, mediastinitis, continued dysphagia and difficulty, recurrence,  and risks of the anesthetic.  All questions area answered and consent was  obtained.   DESCRIPTION OF PROCEDURE:  The patient was taken to the operating room and  placed in supine position.  Under adequate general endotracheal tube  anesthesia, the Zenker's diverticulum scope was placed and the larynx was  elevated.  Immediately the esophageal inlet could be identified as well as  the large pocket of the Zenker's.  There was some debris that was suctioned  out.  This scope was then suspended and with the fiberoptic telescope, the  picture was taken of the esophagus with a red rubber catheter in place and  the Zenker's.  The stapler which was a TSB35 was placed, straight stapler,  with half into the esophageal opening and half into the Zenker's.  The  stapler was placed all the way down to the base of  the Zenker's pouch.  The  staples were then released and the cricopharyngeus muscle divided.  This had  excellent closure and looked very good.  The red rubber catheter was  replaced back in the esophagus and there was good passage of the red rubber  catheter into the esophageal opening.  The patient had good hemostasis.  The  scope was then removed.  Patient was awakened and brought to recovery in  stable condition, counts correct.                                               Suzanna Obey, M.D.    Cordelia Pen  D:  11/19/2003  T:  11/19/2003  Job:  [id number]   cc:   Soyla Murphy. Renne Crigler, M.D.  [address] [place]  [place] [address]  Fax: [phone]

## 2011-11-14 ENCOUNTER — Other Ambulatory Visit: Payer: Self-pay | Admitting: Dermatology

## 2012-10-01 ENCOUNTER — Other Ambulatory Visit: Payer: Self-pay | Admitting: Otolaryngology

## 2012-10-01 DIAGNOSIS — C801 Malignant (primary) neoplasm, unspecified: Secondary | ICD-10-CM

## 2012-10-01 HISTORY — DX: Malignant (primary) neoplasm, unspecified: C80.1

## 2012-10-12 ENCOUNTER — Other Ambulatory Visit: Payer: Self-pay | Admitting: Otolaryngology

## 2012-10-12 ENCOUNTER — Ambulatory Visit
Admission: RE | Admit: 2012-10-12 | Discharge: 2012-10-12 | Disposition: A | Discharge status: Home / Self Care | Payer: Medicare Other | Source: Ambulatory Visit | Attending: Otolaryngology | Admitting: Otolaryngology

## 2012-10-12 DIAGNOSIS — C801 Malignant (primary) neoplasm, unspecified: Secondary | ICD-10-CM

## 2012-10-12 DIAGNOSIS — K1370 Unspecified lesions of oral mucosa: Secondary | ICD-10-CM

## 2012-10-12 MED ORDER — IOHEXOL 300 MG/ML  SOLN
60.0000 mL | Freq: Once | INTRAMUSCULAR | Status: AC | PRN
  Administered 2012-10-12: 60 mL via INTRAVENOUS

## 2012-11-09 ENCOUNTER — Other Ambulatory Visit: Payer: Self-pay | Admitting: Otolaryngology

## 2012-11-22 ENCOUNTER — Encounter (HOSPITAL_COMMUNITY): Payer: Self-pay | Admitting: Pharmacy Technician

## 2012-11-23 ENCOUNTER — Encounter (HOSPITAL_COMMUNITY)
Admission: RE | Admit: 2012-11-23 | Discharge: 2012-11-23 | Disposition: A | Discharge status: Home / Self Care | Payer: Medicare Other | Source: Ambulatory Visit | Attending: Otolaryngology | Admitting: Otolaryngology

## 2012-11-23 ENCOUNTER — Encounter (HOSPITAL_COMMUNITY): Payer: Self-pay

## 2012-11-23 HISTORY — DX: Other chronic pain: G89.29

## 2012-11-23 HISTORY — DX: Sleep apnea, unspecified: G47.30

## 2012-11-23 HISTORY — DX: Anemia, unspecified: D64.9

## 2012-11-23 HISTORY — DX: Inflammatory liver disease, unspecified: K75.9

## 2012-11-23 HISTORY — DX: Gout, unspecified: M10.9

## 2012-11-23 HISTORY — DX: Dorsalgia, unspecified: M54.9

## 2012-11-23 HISTORY — DX: Chronic kidney disease, unspecified: N18.9

## 2012-11-23 HISTORY — DX: Essential (primary) hypertension: I10

## 2012-11-23 HISTORY — DX: Malignant (primary) neoplasm, unspecified: C80.1

## 2012-11-23 HISTORY — DX: Atherosclerotic heart disease of native coronary artery without angina pectoris: I25.10

## 2012-11-23 HISTORY — DX: Anxiety disorder, unspecified: F41.9

## 2012-11-23 HISTORY — DX: Gastro-esophageal reflux disease without esophagitis: K21.9

## 2012-11-23 HISTORY — DX: Disorder of thyroid, unspecified: E07.9

## 2012-11-23 LAB — CBC
Hemoglobin: 12.2 g/dL — ABNORMAL LOW (ref 13.0–17.0)
MCHC: 33.2 g/dL (ref 30.0–36.0)
RBC: 3.85 MIL/uL — ABNORMAL LOW (ref 4.22–5.81)
WBC: 8 10*3/uL (ref 4.0–10.5)

## 2012-11-23 LAB — SURGICAL PCR SCREEN
MRSA, PCR: NEGATIVE
Staphylococcus aureus: NEGATIVE

## 2012-11-23 LAB — BASIC METABOLIC PANEL
BUN: 19 mg/dL (ref 6–23)
Chloride: 107 mEq/L (ref 96–112)
GFR calc Af Amer: 50 mL/min — ABNORMAL LOW (ref >90)
GFR calc non Af Amer: 43 mL/min — ABNORMAL LOW (ref >90)
Potassium: 4.1 mEq/L (ref 3.5–5.1)
Sodium: 144 mEq/L (ref 135–145)

## 2012-11-23 NOTE — Progress Notes (Addendum)
Requested EKG and CXR by fax from Dr. Hulan Fess (phone number  [phone]) office.

## 2012-11-23 NOTE — Pre-Procedure Instructions (Addendum)
Gary [name]  11/23/2012   Your procedure is scheduled on:  Thursday November 30, 2011  Report to [facility] at 7:00AM.  Call this number if you have problems the morning of surgery: [phone]     Remember:   Do not eat food or drink liquids after midnight.   Take these medicines the morning of surgery with A SIP OF WATER: acyclovir, xanax, bisoprolol, synthroid, oxycodone, protonix, prednisone   Do not wear jewelry, make-up or nail polish.  Do not wear lotions, powders, or perfumes.   Do not shave 48 hours prior to surgery. Men may shave face and neck.  Do not bring valuables to the hospital.  Contacts, dentures or bridgework may not be worn into surgery.  Leave suitcase in the car. After surgery it may be brought to your room.  For patients admitted to the hospital, checkout time is 11:00 AM the day of  discharge.   Patients discharged the day of surgery will not be allowed to drive home.  Name and phone number of your driver: family / friend  Special Instructions: Shower using CHG 2 nights before surgery and the night before surgery.  If you shower the day of surgery use CHG.  Use special wash - you have one bottle of CHG for all showers.  You should use approximately 1/3 of the bottle for each shower.   Please read over the following fact sheets that you were given: Pain Booklet, Coughing and Deep Breathing, MRSA Information and Surgical Site Infection Prevention

## 2012-11-27 NOTE — Progress Notes (Signed)
Ekg,cxr,notes from Dr. Carolee Rota office on chart.

## 2012-11-29 ENCOUNTER — Encounter (HOSPITAL_COMMUNITY): Payer: Self-pay | Admitting: *Deleted

## 2012-11-29 ENCOUNTER — Ambulatory Visit (HOSPITAL_COMMUNITY)
Admission: RE | Admit: 2012-11-29 | Discharge: 2012-11-29 | Disposition: A | Discharge status: Home / Self Care | Payer: Medicare Other | Source: Ambulatory Visit | Attending: Otolaryngology | Admitting: Otolaryngology

## 2012-11-29 ENCOUNTER — Encounter (HOSPITAL_COMMUNITY)
Admission: RE | Disposition: A | Discharge status: Home / Self Care | Payer: Self-pay | Source: Ambulatory Visit | Attending: Otolaryngology

## 2012-11-29 ENCOUNTER — Encounter (HOSPITAL_COMMUNITY): Payer: Self-pay | Admitting: Anesthesiology

## 2012-11-29 DIAGNOSIS — C0689 Malignant neoplasm of overlapping sites of other parts of mouth: Secondary | ICD-10-CM | POA: Insufficient documentation

## 2012-11-29 DIAGNOSIS — Z538 Procedure and treatment not carried out for other reasons: Secondary | ICD-10-CM | POA: Insufficient documentation

## 2012-11-29 DIAGNOSIS — N189 Chronic kidney disease, unspecified: Secondary | ICD-10-CM | POA: Insufficient documentation

## 2012-11-29 DIAGNOSIS — G4730 Sleep apnea, unspecified: Secondary | ICD-10-CM | POA: Insufficient documentation

## 2012-11-29 DIAGNOSIS — Z79899 Other long term (current) drug therapy: Secondary | ICD-10-CM | POA: Insufficient documentation

## 2012-11-29 DIAGNOSIS — K219 Gastro-esophageal reflux disease without esophagitis: Secondary | ICD-10-CM | POA: Insufficient documentation

## 2012-11-29 DIAGNOSIS — I129 Hypertensive chronic kidney disease with stage 1 through stage 4 chronic kidney disease, or unspecified chronic kidney disease: Secondary | ICD-10-CM | POA: Insufficient documentation

## 2012-11-29 DIAGNOSIS — M069 Rheumatoid arthritis, unspecified: Secondary | ICD-10-CM | POA: Insufficient documentation

## 2012-11-29 DIAGNOSIS — I2510 Atherosclerotic heart disease of native coronary artery without angina pectoris: Secondary | ICD-10-CM | POA: Insufficient documentation

## 2012-11-29 DIAGNOSIS — Z01812 Encounter for preprocedural laboratory examination: Secondary | ICD-10-CM | POA: Insufficient documentation

## 2012-11-29 DIAGNOSIS — M109 Gout, unspecified: Secondary | ICD-10-CM | POA: Insufficient documentation

## 2012-11-29 DIAGNOSIS — F411 Generalized anxiety disorder: Secondary | ICD-10-CM | POA: Insufficient documentation

## 2012-11-29 SURGERY — CANCELLED PROCEDURE
Laterality: Right

## 2012-11-29 MED ORDER — HYDROMORPHONE HCL PF 1 MG/ML IJ SOLN: 0.2500 mg | INTRAMUSCULAR | Status: DC | PRN

## 2012-11-29 MED ORDER — FENTANYL CITRATE 0.05 MG/ML IJ SOLN: 50.0000 ug | Freq: Once | INTRAMUSCULAR | Status: DC

## 2012-11-29 MED ORDER — MIDAZOLAM HCL 2 MG/2ML IJ SOLN: 1.0000 mg | INTRAMUSCULAR | Status: DC | PRN

## 2012-11-29 MED ORDER — PROMETHAZINE HCL 25 MG/ML IJ SOLN: 6.2500 mg | INTRAMUSCULAR | Status: DC | PRN

## 2012-11-29 MED ORDER — LACTATED RINGERS IV SOLN
INTRAVENOUS | Status: DC
  Administered 2012-11-29: 09:00:00 via INTRAVENOUS

## 2012-11-29 MED ORDER — LIDOCAINE-EPINEPHRINE 1 %-1:100000 IJ SOLN
INTRAMUSCULAR | Status: AC
  Filled 2012-11-29: qty 1

## 2012-11-29 SURGICAL SUPPLY — 48 items
AIRSTRIP 4 3/4X3 1/4 7185 (GAUZE/BANDAGES/DRESSINGS) IMPLANT
ATTRACTOMAT 16X20 MAGNETIC DRP (DRAPES) IMPLANT
BANDAGE CONFORM 2  STR LF (GAUZE/BANDAGES/DRESSINGS) IMPLANT
BANDAGE GAUZE ELAST BULKY 4 IN (GAUZE/BANDAGES/DRESSINGS) IMPLANT
CANISTER SUCTION 2500CC (MISCELLANEOUS) IMPLANT
CATH ROBINSON RED A/P 16FR (CATHETERS) IMPLANT
CLEANER TIP ELECTROSURG 2X2 (MISCELLANEOUS) ×2 IMPLANT
CLOTH BEACON ORANGE TIMEOUT ST (SAFETY) ×2 IMPLANT
CONT SPEC 4OZ CLIKSEAL STRL BL (MISCELLANEOUS) ×2 IMPLANT
COVER SURGICAL LIGHT HANDLE (MISCELLANEOUS) ×2 IMPLANT
CRADLE DONUT ADULT HEAD (MISCELLANEOUS) IMPLANT
DERMABOND ADVANCED (GAUZE/BANDAGES/DRESSINGS) ×1
DERMABOND ADVANCED .7 DNX12 (GAUZE/BANDAGES/DRESSINGS) ×1 IMPLANT
DRAIN PENROSE 1/4X12 LTX STRL (WOUND CARE) IMPLANT
DRAIN SNY 10 ROU (WOUND CARE) IMPLANT
DRAIN SNY 7 FPER (WOUND CARE) IMPLANT
DRSG EMULSION OIL 3X3 NADH (GAUZE/BANDAGES/DRESSINGS) IMPLANT
ELECT COATED BLADE 2.86 ST (ELECTRODE) ×2 IMPLANT
ELECT NEEDLE TIP 2.8 STRL (NEEDLE) IMPLANT
ELECT REM PT RETURN 9FT ADLT (ELECTROSURGICAL) ×2
ELECTRODE REM PT RTRN 9FT ADLT (ELECTROSURGICAL) ×1 IMPLANT
GAUZE SPONGE 4X4 16PLY XRAY LF (GAUZE/BANDAGES/DRESSINGS) IMPLANT
GLOVE ECLIPSE 7.5 STRL STRAW (GLOVE) ×2 IMPLANT
GOWN STRL NON-REIN LRG LVL3 (GOWN DISPOSABLE) ×4 IMPLANT
GOWN STRL REIN XL XLG (GOWN DISPOSABLE) ×2 IMPLANT
KIT BASIN OR (CUSTOM PROCEDURE TRAY) ×2 IMPLANT
KIT ROOM TURNOVER OR (KITS) ×2 IMPLANT
NEEDLE 25GX 5/8IN NON SAFETY (NEEDLE) IMPLANT
NS IRRIG 1000ML POUR BTL (IV SOLUTION) ×2 IMPLANT
PAD ARMBOARD 7.5X6 YLW CONV (MISCELLANEOUS) ×4 IMPLANT
PENCIL FOOT CONTROL (ELECTRODE) ×2 IMPLANT
POUCH STERILIZING 3 X22 (STERILIZATION PRODUCTS) IMPLANT
SPONGE GAUZE 4X4 12PLY (GAUZE/BANDAGES/DRESSINGS) IMPLANT
SUT CHROMIC 4 0 P 3 18 (SUTURE) ×2 IMPLANT
SUT ETHILON 4 0 PS 2 18 (SUTURE) ×2 IMPLANT
SUT ETHILON 5 0 P 3 18 (SUTURE) ×1
SUT NYLON ETHILON 5-0 P-3 1X18 (SUTURE) ×1 IMPLANT
SUT SILK 4 0 (SUTURE) ×1
SUT SILK 4-0 18XBRD TIE 12 (SUTURE) ×1 IMPLANT
SWAB COLLECTION DEVICE MRSA (MISCELLANEOUS) IMPLANT
SYR BULB IRRIGATION 50ML (SYRINGE) IMPLANT
SYR TB 1ML LUER SLIP (SYRINGE) IMPLANT
TOWEL OR 17X24 6PK STRL BLUE (TOWEL DISPOSABLE) ×2 IMPLANT
TOWEL OR 17X26 10 PK STRL BLUE (TOWEL DISPOSABLE) ×2 IMPLANT
TRAY ENT MC OR (CUSTOM PROCEDURE TRAY) ×2 IMPLANT
TUBE ANAEROBIC SPECIMEN COL (MISCELLANEOUS) IMPLANT
WATER STERILE IRR 1000ML POUR (IV SOLUTION) ×2 IMPLANT
YANKAUER SUCT BULB TIP NO VENT (SUCTIONS) IMPLANT

## 2012-11-29 NOTE — Anesthesia Preprocedure Evaluation (Deleted)
Anesthesia Evaluation  Patient identified by MRN, date of birth, ID band Patient awake    Airway Mallampati: I TM Distance: >3 FB Neck ROM: Full    Dental   Pulmonary sleep apnea ,  breath sounds clear to auscultation        Cardiovascular hypertension, + CAD Rhythm:Regular Rate:Normal     Neuro/Psych Anxiety    GI/Hepatic GERD-  ,(+) Hepatitis -  Endo/Other    Renal/GU Renal InsufficiencyRenal disease     Musculoskeletal   Abdominal   Peds  Hematology   Anesthesia Other Findings   Reproductive/Obstetrics                           Anesthesia Physical Anesthesia Plan  ASA: III  Anesthesia Plan: General   Post-op Pain Management:    Induction: Intravenous  Airway Management Planned: Oral ETT  Additional Equipment:   Intra-op Plan:   Post-operative Plan: Extubation in OR  Informed Consent: I have reviewed the patients History and Physical, chart, labs and discussed the procedure including the risks, benefits and alternatives for the proposed anesthesia with the patient or authorized representative who has indicated his/her understanding and acceptance.     Plan Discussed with: CRNA and Surgeon  Anesthesia Plan Comments: (Surgery canceled per surgeon as mass had greatly increased in size)       Anesthesia Quick Evaluation

## 2012-11-29 NOTE — H&P (Signed)
Gary [name] is an 72 y.o. male.   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 It has been there for an indeterminate time frame but was diagnosed about one month ago. Patient had elected due to family circumstances to wait to have this resected. Options of treatment were discussed including radiation therapy. He was adamant about no radiation therapy. He elected for surgery and did not want any consideration for flap reconstruction Past Medical History  Diagnosis Date  . Coronary artery disease     stent placement 2000, does not see cardiologist at this time  . Hypertension     sees Dr. Hart Carwin  . Thyroid disease   . Anxiety   . Sleep apnea     uses cpap machine  . Chronic kidney disease     renal insufficiency, [place] [facility]  . GERD (gastroesophageal reflux disease)   . Arthritis     rhematoid arthritis  . Gout     pseudo gout  . Cancer     pre-cancerous lesions removed 11/22/12 and hx of  . Anemia     "chronic anemic"  . Hepatitis     hepatitis "C"  . Chronic back pain     hx of    Past Surgical History  Procedure Date  . Tonsillectomy   . Coronary angioplasty     stent placement 2000  . Dental extractions     upper and lower    History reviewed. No pertinent family history. Social History:  reports that he has quit smoking. He does not have any smokeless tobacco history on file. He reports that he drinks alcohol. He reports that he does not use illicit drugs.  Allergies:  Allergies  Allergen Reactions  . Cellcept (Mycophenolate Mofetil) Other (See Comments)    Reaction unknown  . Colcrys (Colchicine) Other (See Comments)    myalgias  . Erythromycin Other (See Comments)    Reaction unknown  . Methotrexate Derivatives Hives, Itching and Rash    Only in tablet form  . Neosporin (Neomycin-Bacitracin Zn-Polymyx) Rash  . Ultram (Tramadol) Rash     Medications Prior to Admission  Medication Sig Dispense Refill  . acyclovir (ZOVIRAX) 400 MG tablet Take 400 mg by mouth 2 (two) times daily.      [name] ALPRAZolam (XANAX) 0.5 MG tablet Take 0.5 mg by mouth at bedtime as needed. For anxiety      . aspirin EC 81 MG tablet Take 81 mg by mouth daily.      . bisoprolol (ZEBETA) 5 MG tablet Take 5 mg by mouth daily.      . Calcium Carb-Cholecalciferol (CALCIUM-VITAMIN D) 600-400 MG-UNIT TABS Take 1 tablet by mouth daily.      [name] desonide (DESOWEN) 0.05 % cream Apply 1 application topically 2 (two) times daily.      [name] Dexamethasone (OZURDEX) 0.7 MG IMPL Inject 1 each into the eye every 6 (six) months.      . diazepam (VALIUM) 5 MG tablet Take 5 mg by mouth every 12 (twelve) hours as needed. For anxiety      . Glucosamine-Chondroit-Vit C-Mn (GLUCOSAMINE 1500 COMPLEX PO) Take 1 capsule by mouth daily.      [name] levothyroxine (SYNTHROID, LEVOTHROID) 200 MCG tablet Take 200 mcg by mouth daily.      . metroNIDAZOLE (METROGEL) 1 % gel Apply 1 application topically daily.      [name]  Multiple Vitamin (MULTIVITAMIN WITH MINERALS) TABS Take 1 tablet by mouth daily.      . mupirocin ointment (BACTROBAN) 2 % Apply 1 application topically 2 (two) times daily as needed. For irritation      . omega-3 acid ethyl esters (LOVAZA) 1 G capsule Take 2 g by mouth 2 (two) times daily.      [name] oxyCODONE (ROXICODONE) 15 MG immediate release tablet Take 15 mg by mouth every 4 (four) hours as needed. For pain      . pantoprazole (PROTONIX) 40 MG tablet Take 40 mg by mouth daily.      . polyethylene glycol (MIRALAX / GLYCOLAX) packet Take 17 g by mouth daily.      . Potassium 99 MG TABS Take 1 tablet by mouth daily.      . predniSONE (STERAPRED UNI-PAK) 5 MG TABS Take 5 mg by mouth daily.      . simvastatin (ZOCOR) 40 MG tablet Take 40 mg by mouth every evening.      . Zinc 30 MG CAPS Take 1 capsule by mouth daily.      [name] zolpidem (AMBIEN) 10 MG tablet Take 10 mg by mouth at bedtime  as needed. For sleep        No results found for this or any previous visit (from the past 48 hour(s)). No results found.  Review of Systems  Constitutional: Negative.   HENT: Negative.   Eyes: Negative.   Respiratory: Negative.   Cardiovascular: Negative.   Skin: Negative.     Blood pressure 145/88, pulse 48, temperature 98.1 F (36.7 C), temperature source Oral, resp. rate 18, SpO2 98.00%. Physical Exam  Constitutional: He appears well-developed and well-nourished.  HENT:  Right Ear: External ear normal.  Left Ear: External ear normal.  Nose: Nose normal.       He has a mass in the right medial aspect of the retromolar trigone on the anterior faucial arch. It is very papillary-appearing and plaque like. It is mobile.  Eyes: Pupils are equal, round, and reactive to light.  Neck: Normal range of motion. Neck supple.  Cardiovascular: Normal rate.   Respiratory: Effort normal.     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 He is ready to proceed.  Suzanna Obey 11/29/2012, 8:37 AM

## 2012-12-04 ENCOUNTER — Encounter: Payer: Self-pay | Admitting: Radiation Oncology

## 2012-12-04 DIAGNOSIS — K219 Gastro-esophageal reflux disease without esophagitis: Secondary | ICD-10-CM | POA: Insufficient documentation

## 2012-12-04 DIAGNOSIS — M109 Gout, unspecified: Secondary | ICD-10-CM | POA: Insufficient documentation

## 2012-12-04 DIAGNOSIS — G4730 Sleep apnea, unspecified: Secondary | ICD-10-CM | POA: Insufficient documentation

## 2012-12-04 DIAGNOSIS — N189 Chronic kidney disease, unspecified: Secondary | ICD-10-CM | POA: Insufficient documentation

## 2012-12-04 DIAGNOSIS — K759 Inflammatory liver disease, unspecified: Secondary | ICD-10-CM | POA: Insufficient documentation

## 2012-12-04 DIAGNOSIS — E079 Disorder of thyroid, unspecified: Secondary | ICD-10-CM | POA: Insufficient documentation

## 2012-12-04 DIAGNOSIS — E785 Hyperlipidemia, unspecified: Secondary | ICD-10-CM | POA: Insufficient documentation

## 2012-12-04 DIAGNOSIS — M069 Rheumatoid arthritis, unspecified: Secondary | ICD-10-CM | POA: Insufficient documentation

## 2012-12-04 DIAGNOSIS — E039 Hypothyroidism, unspecified: Secondary | ICD-10-CM | POA: Insufficient documentation

## 2012-12-04 DIAGNOSIS — C4490 Unspecified malignant neoplasm of skin, unspecified: Secondary | ICD-10-CM | POA: Insufficient documentation

## 2012-12-04 DIAGNOSIS — M549 Dorsalgia, unspecified: Secondary | ICD-10-CM | POA: Insufficient documentation

## 2012-12-04 DIAGNOSIS — G8929 Other chronic pain: Secondary | ICD-10-CM

## 2012-12-04 DIAGNOSIS — I10 Essential (primary) hypertension: Secondary | ICD-10-CM | POA: Insufficient documentation

## 2012-12-04 DIAGNOSIS — D649 Anemia, unspecified: Secondary | ICD-10-CM | POA: Insufficient documentation

## 2012-12-04 DIAGNOSIS — F419 Anxiety disorder, unspecified: Secondary | ICD-10-CM | POA: Insufficient documentation

## 2012-12-04 DIAGNOSIS — I2510 Atherosclerotic heart disease of native coronary artery without angina pectoris: Secondary | ICD-10-CM | POA: Insufficient documentation

## 2012-12-05 ENCOUNTER — Ambulatory Visit
Admission: RE | Admit: 2012-12-05 | Discharge: 2012-12-05 | Disposition: A | Discharge status: Home / Self Care | Payer: Medicare Other | Source: Ambulatory Visit | Attending: Radiation Oncology | Admitting: Radiation Oncology

## 2012-12-05 ENCOUNTER — Encounter: Payer: Self-pay | Admitting: Radiation Oncology

## 2012-12-05 VITALS — BP 132/77 | HR 46 | Temp 97.9°F | Resp 20 | Wt 185.0 lb

## 2012-12-05 DIAGNOSIS — C062 Malignant neoplasm of retromolar area: Secondary | ICD-10-CM

## 2012-12-05 DIAGNOSIS — M1990 Unspecified osteoarthritis, unspecified site: Secondary | ICD-10-CM

## 2012-12-05 DIAGNOSIS — Z79899 Other long term (current) drug therapy: Secondary | ICD-10-CM | POA: Insufficient documentation

## 2012-12-05 DIAGNOSIS — K759 Inflammatory liver disease, unspecified: Secondary | ICD-10-CM

## 2012-12-05 DIAGNOSIS — C801 Malignant (primary) neoplasm, unspecified: Secondary | ICD-10-CM

## 2012-12-05 HISTORY — DX: Complete loss of teeth, unspecified cause, unspecified class: K08.109

## 2012-12-05 HISTORY — DX: Reserved for inherently not codable concepts without codable children: IMO0001

## 2012-12-05 HISTORY — DX: Presence of dental prosthetic device (complete) (partial): Z97.2

## 2012-12-05 HISTORY — DX: Unspecified hearing loss, unspecified ear: H91.90

## 2012-12-05 NOTE — Progress Notes (Signed)
Please see the Nurse Progress Note in the MD Initial Consult Encounter for this patient. 

## 2012-12-05 NOTE — Progress Notes (Signed)
Radiation Oncology         [phone]) [phone] ________________________________  Initial outpatient Consultation  Name: Gary [name] MRN: [id number]  Date: 12/05/2012  DOB: 09-14-1941  REFERRING PHYSICIAN: Suzanna Obey, MD  DIAGNOSIS: The primary encounter diagnosis was Cancer. A diagnosis of T2N0 SCCA right retromolar trigone. was also pertinent to this visit.  HISTORY OF PRESENT ILLNESS::Gary [name] is a 72 y.o. male  who noticed a growth in the back of his right mouth June 2012.  His wife was diagnosed with multiple myeloma about that same time so he focused on her and kind of ignored it.  Suzanna Obey did a biopsy in November 2013 that was positive for squamous cell.  His wife was hospitalized at [facility] for over a month over the holidays and almost died.  When she was discharged, he scheduled his surgery and it was too big at that point to excise.  He refused referral at that time to a head and neck surgeon. Dr. Jearld Fenton sent him to see me because he was refusing surgery to discuss chemotherapy and radiation. His last imaging was a CT of the neck on 10/12/2012 which showed focal enhancement along the right posterior lateral oral cavity measuring 15 x 8 x 13 mm just medial to the medial pterygoid. No significant adenopathy was noted.   He has not lost any weight. He states is actually gaining weight. He has barely noticed some very mild stretching when he opens his jaw. He does not have any pain on swallowing. He's not had any difficulties with speech or protruding his tongue. His voice quality is normal. He has recently received new hearing aids and has noted a definite improvement in his hearing. He has chronic dry mouth to do his CPAP use. He has not noticed any palpable lumps or bumps in his neck. He has had full dentures since the age of 65.  PREVIOUS RADIATION THERAPY: No  PAST MEDICAL HISTORY:  has a past medical history of Coronary artery disease; Hypertension; Thyroid disease; Anxiety; Sleep  apnea; Chronic kidney disease; GERD (gastroesophageal reflux disease); Arthritis; Gout; Cancer; Anemia; Hepatitis; Chronic back pain; Cancer (10/01/12); Anxiety; Hypertension; Dyslipidemia; Hypothyroid; CAD (coronary artery disease); Insomnia; Full dentures; and Hearing impairment.    PAST SURGICAL HISTORY: Past Surgical History  Procedure Date  . Tonsillectomy   . Coronary angioplasty 2000    stent placement 2000  . Dental extractions     upper and lower    FAMILY HISTORY: family history includes Cancer in his mother; Coronary artery disease in his father; Coronary artery disease (age of onset:40) in his brother; and Heart attack in his brother.  SOCIAL HISTORY:  reports that he quit smoking about 22 years ago. He does not have any smokeless tobacco history on file. He reports that he drinks alcohol. He reports that he does not use illicit drugs. he worked as a Psychologist, occupational.  ALLERGIES: Cellcept; Colcrys; Erythromycin; Methotrexate derivatives; Neosporin; and Ultram  MEDICATIONS:  Current Outpatient Prescriptions  Medication Sig Dispense Refill  . acyclovir (ZOVIRAX) 400 MG tablet Take 400 mg by mouth 2 (two) times daily.      [name] ALPRAZolam (XANAX) 0.5 MG tablet Take 0.5 mg by mouth at bedtime as needed. For anxiety      . aspirin EC 81 MG tablet Take 81 mg by mouth daily.      . bisoprolol (ZEBETA) 5 MG tablet Take 5 mg by mouth daily.      . Calcium Carb-Cholecalciferol (CALCIUM-VITAMIN D)  600-400 MG-UNIT TABS Take 1 tablet by mouth daily.      . cetirizine (ZYRTEC) 10 MG tablet Take 10 mg by mouth daily.      . Cholecalciferol (VITAMIN D) 1000 UNITS capsule Take 1,000 Units by mouth daily.      [name] desonide (DESOWEN) 0.05 % cream Apply 1 application topically 2 (two) times daily.      [name] Dexamethasone (OZURDEX) 0.7 MG IMPL Inject 1 each into the eye every 6 (six) months.      . diazepam (VALIUM) 5 MG tablet Take 5 mg by mouth every 12 (twelve) hours as needed. For anxiety      .  Glucosamine-Chondroit-Vit C-Mn (GLUCOSAMINE 1500 COMPLEX PO) Take 1 capsule by mouth daily.      [name] levothyroxine (SYNTHROID, LEVOTHROID) 200 MCG tablet Take 200 mcg by mouth daily.      . metroNIDAZOLE (METROGEL) 1 % gel Apply 1 application topically daily.      . Multiple Vitamin (MULTIVITAMIN WITH MINERALS) TABS Take 1 tablet by mouth daily.      . mupirocin ointment (BACTROBAN) 2 % Apply 1 application topically 2 (two) times daily as needed. For irritation      . omega-3 acid ethyl esters (LOVAZA) 1 G capsule Take 2 g by mouth 2 (two) times daily.      [name] oxyCODONE (ROXICODONE) 15 MG immediate release tablet Take 15 mg by mouth every 4 (four) hours as needed. For pain      . pantoprazole (PROTONIX) 40 MG tablet Take 40 mg by mouth daily.      . polyethylene glycol (MIRALAX / GLYCOLAX) packet Take 17 g by mouth daily.      . Potassium 99 MG TABS Take 1 tablet by mouth daily.      . predniSONE (STERAPRED UNI-PAK) 5 MG TABS Take 5 mg by mouth daily.      . simvastatin (ZOCOR) 40 MG tablet Take 40 mg by mouth every evening.      . triamcinolone cream (KENALOG) 0.1 % Apply topically 2 (two) times daily.      . Zinc 30 MG CAPS Take 1 capsule by mouth daily.      [name] zolpidem (AMBIEN) 10 MG tablet Take 10 mg by mouth at bedtime as needed. For sleep        REVIEW OF SYSTEMS:  A 15 point review of systems is documented in the electronic medical record. This was obtained by the nursing staff. However, I reviewed this with the patient to discuss relevant findings and make appropriate changes.  Pertinent items are noted in HPI.   PHYSICAL EXAM:  weight is 185 lb (83.915 kg). His oral temperature is 97.9 F (36.6 C). His blood pressure is 132/77 and his pulse is 46. His respiration is 20.   He is a pleasant male who appears his stated age sitting comfortably in examining table. He has no palpable submandibular cervical or supraclavicular adenopathy. His left oral cavity and oropharynx is normal. His tongue  protrudes at midline. His uvula rises symmetrically. There is a 2-3 cm exophytic mass in the posterior right oral cavity. This fills the retromolar trigone. By indirect mirror examination it does not appear to involve the base of tongue or tonsillar fossa. He is alert and oriented x3. His cranial nerves III through XII are tested and intact.  LABORATORY DATA:  Lab Results  Component Value Date   WBC 8.0 11/23/2012   HGB 12.2* 11/23/2012   HCT 36.7* 11/23/2012  MCV 95.3 11/23/2012   PLT 180 11/23/2012   Lab Results  Component Value Date   NA 144 11/23/2012   K 4.1 11/23/2012   CL 107 11/23/2012   CO2 29 11/23/2012   Lab Results  Component Value Date   ALT 50 12/01/2010   AST 50* 12/01/2010   ALKPHOS 47 12/01/2010   BILITOT 0.5 12/01/2010     RADIOGRAPHY: No results found.    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 We discussed that this could be a radical surgery involving a forearm free flap and may involve the removal of lymph nodes. We discussed that the local control rates with radiation or chemoradiation are significantly less than those seen with primary surgical therapy. I also fear that his ability to tolerate chemotherapy will be limited by his already compromised hearing. We discussed the results of retrospective data from wash you showing a local recurrence rate of 40-50% and a 5 year disease-free survival in the neighborhood of 20-30%. We discussed the poor results for salvage surgery after primary chemoradiotherapy in this setting. If he did elect to pursue chemoradiotherapy I discussed with him 7 weeks of daily treatment. We discussed the process of simulation the making a mask. We discussed the toxicities of treatment including but not limited to odynophagia dysphasia decreased hearing decreased taste and permanent or  worsening dry mouth. We discussed the possibility of a feeding tube and the possibility of feeding tube dependence.  After this discussion he agreed to be referred for surgical consultation. I scheduled him at [facility] on Wednesday. I will get his records sent over. We will try to get a PET scan performed before his appointment there. His chest has not been staged so this PET scan will act as staging there. I have not yet scheduled a repeat CT scan of the neck as his most recent scan was in December. If Dr. Hezzie Bump believes this will be helpful we can certainly schedule one here. His wife did request afternoon appointment so she is still recovering from her time at [facility]. We will try to make that possible. I discussed this plan with Dr. Jearld Fenton who is in agreement.  I spent 60 minutes  face to face with the patient and more than 50% of that time was spent in counseling and/or coordination of care.   ------------------------------------------------  Lurline Hare, MD

## 2012-12-05 NOTE — Progress Notes (Signed)
Pt denies pain but states one week ago he began to get sore at back of right side of mouth when eating. He has not lost weight. Pt states he does not want to have surgery, so he is here for radiation consultation. His wife has multiple myeloma, is pt of Dr Arline Asp.

## 2012-12-06 ENCOUNTER — Telehealth: Payer: Self-pay

## 2012-12-06 NOTE — Progress Notes (Signed)
error 

## 2012-12-06 NOTE — Addendum Note (Signed)
Encounter addended by: Glennie Hawk, RN on: 12/06/2012 10:45 AM<BR>     Documentation filed: Notes Section, Charges VN

## 2012-12-06 NOTE — Telephone Encounter (Signed)
Per SW, faxed NPE 12/05/12, labs 11/23/12, CT neck 10/12/12, path 10/01/12 to Dr. Hezzie Bump, fax [phone].  Received confirmation.

## 2012-12-07 ENCOUNTER — Telehealth: Payer: Self-pay | Admitting: *Deleted

## 2012-12-07 NOTE — Telephone Encounter (Signed)
CALLED PATIENT TO INFORM OF TEST, SPOKE WITH PATIETNT'S WIFE AND SHE IS AWARE OF THIS TEST

## 2012-12-07 NOTE — Telephone Encounter (Signed)
XXXX 

## 2012-12-12 ENCOUNTER — Inpatient Hospital Stay (HOSPITAL_COMMUNITY): Admission: RE | Admit: 2012-12-12 | Payer: Medicare Other | Source: Ambulatory Visit

## 2012-12-14 ENCOUNTER — Encounter (HOSPITAL_COMMUNITY)
Admission: RE | Admit: 2012-12-14 | Discharge: 2012-12-14 | Disposition: A | Discharge status: Home / Self Care | Payer: Medicare Other | Source: Ambulatory Visit | Attending: Radiation Oncology | Admitting: Radiation Oncology

## 2012-12-14 ENCOUNTER — Encounter (HOSPITAL_COMMUNITY): Payer: Self-pay

## 2012-12-14 ENCOUNTER — Telehealth: Payer: Self-pay | Admitting: Radiation Oncology

## 2012-12-14 DIAGNOSIS — C0689 Malignant neoplasm of overlapping sites of other parts of mouth: Secondary | ICD-10-CM | POA: Insufficient documentation

## 2012-12-14 DIAGNOSIS — R911 Solitary pulmonary nodule: Secondary | ICD-10-CM | POA: Insufficient documentation

## 2012-12-14 DIAGNOSIS — I2510 Atherosclerotic heart disease of native coronary artery without angina pectoris: Secondary | ICD-10-CM | POA: Insufficient documentation

## 2012-12-14 DIAGNOSIS — C801 Malignant (primary) neoplasm, unspecified: Secondary | ICD-10-CM

## 2012-12-14 DIAGNOSIS — J984 Other disorders of lung: Secondary | ICD-10-CM | POA: Insufficient documentation

## 2012-12-14 MED ORDER — FLUDEOXYGLUCOSE F - 18 (FDG) INJECTION
18.1000 | Freq: Once | INTRAVENOUS | Status: AC | PRN
  Administered 2012-12-14: 18.1 via INTRAVENOUS

## 2012-12-14 NOTE — Telephone Encounter (Signed)
Per SW, faxed 12/14/12 PET to Dr. Hezzie Bump at [facility], fax [phone].  Received confirmation.

## 2012-12-21 ENCOUNTER — Inpatient Hospital Stay (HOSPITAL_COMMUNITY): Payer: Medicare Other

## 2012-12-21 ENCOUNTER — Emergency Department (HOSPITAL_COMMUNITY): Payer: Medicare Other

## 2012-12-21 ENCOUNTER — Encounter (HOSPITAL_COMMUNITY): Payer: Self-pay | Admitting: *Deleted

## 2012-12-21 ENCOUNTER — Inpatient Hospital Stay (HOSPITAL_COMMUNITY)
Admission: EM | Admit: 2012-12-21 | Discharge: 2012-12-26 | DRG: 480 | Disposition: A | Discharge status: Skilled Nursing Facility | Payer: Medicare Other | Attending: Internal Medicine | Admitting: Internal Medicine

## 2012-12-21 DIAGNOSIS — C4490 Unspecified malignant neoplasm of skin, unspecified: Secondary | ICD-10-CM

## 2012-12-21 DIAGNOSIS — R4182 Altered mental status, unspecified: Secondary | ICD-10-CM

## 2012-12-21 DIAGNOSIS — I519 Heart disease, unspecified: Secondary | ICD-10-CM | POA: Diagnosis present

## 2012-12-21 DIAGNOSIS — Z9861 Coronary angioplasty status: Secondary | ICD-10-CM

## 2012-12-21 DIAGNOSIS — G8929 Other chronic pain: Secondary | ICD-10-CM

## 2012-12-21 DIAGNOSIS — M069 Rheumatoid arthritis, unspecified: Secondary | ICD-10-CM | POA: Diagnosis present

## 2012-12-21 DIAGNOSIS — G4700 Insomnia, unspecified: Secondary | ICD-10-CM | POA: Diagnosis present

## 2012-12-21 DIAGNOSIS — R509 Fever, unspecified: Secondary | ICD-10-CM | POA: Insufficient documentation

## 2012-12-21 DIAGNOSIS — M109 Gout, unspecified: Secondary | ICD-10-CM

## 2012-12-21 DIAGNOSIS — E876 Hypokalemia: Secondary | ICD-10-CM | POA: Diagnosis not present

## 2012-12-21 DIAGNOSIS — IMO0002 Reserved for concepts with insufficient information to code with codable children: Secondary | ICD-10-CM

## 2012-12-21 DIAGNOSIS — Z87891 Personal history of nicotine dependence: Secondary | ICD-10-CM

## 2012-12-21 DIAGNOSIS — G4730 Sleep apnea, unspecified: Secondary | ICD-10-CM

## 2012-12-21 DIAGNOSIS — M1990 Unspecified osteoarthritis, unspecified site: Secondary | ICD-10-CM

## 2012-12-21 DIAGNOSIS — S72002A Fracture of unspecified part of neck of left femur, initial encounter for closed fracture: Secondary | ICD-10-CM

## 2012-12-21 DIAGNOSIS — I129 Hypertensive chronic kidney disease with stage 1 through stage 4 chronic kidney disease, or unspecified chronic kidney disease: Secondary | ICD-10-CM | POA: Diagnosis present

## 2012-12-21 DIAGNOSIS — G929 Unspecified toxic encephalopathy: Secondary | ICD-10-CM | POA: Diagnosis present

## 2012-12-21 DIAGNOSIS — I4891 Unspecified atrial fibrillation: Secondary | ICD-10-CM | POA: Diagnosis not present

## 2012-12-21 DIAGNOSIS — K219 Gastro-esophageal reflux disease without esophagitis: Secondary | ICD-10-CM

## 2012-12-21 DIAGNOSIS — E785 Hyperlipidemia, unspecified: Secondary | ICD-10-CM

## 2012-12-21 DIAGNOSIS — E079 Disorder of thyroid, unspecified: Secondary | ICD-10-CM

## 2012-12-21 DIAGNOSIS — M549 Dorsalgia, unspecified: Secondary | ICD-10-CM

## 2012-12-21 DIAGNOSIS — N182 Chronic kidney disease, stage 2 (mild): Secondary | ICD-10-CM | POA: Diagnosis present

## 2012-12-21 DIAGNOSIS — B1920 Unspecified viral hepatitis C without hepatic coma: Secondary | ICD-10-CM | POA: Diagnosis present

## 2012-12-21 DIAGNOSIS — Z8249 Family history of ischemic heart disease and other diseases of the circulatory system: Secondary | ICD-10-CM

## 2012-12-21 DIAGNOSIS — N189 Chronic kidney disease, unspecified: Secondary | ICD-10-CM

## 2012-12-21 DIAGNOSIS — F29 Unspecified psychosis not due to a substance or known physiological condition: Secondary | ICD-10-CM

## 2012-12-21 DIAGNOSIS — C049 Malignant neoplasm of floor of mouth, unspecified: Secondary | ICD-10-CM

## 2012-12-21 DIAGNOSIS — F411 Generalized anxiety disorder: Secondary | ICD-10-CM | POA: Diagnosis present

## 2012-12-21 DIAGNOSIS — H409 Unspecified glaucoma: Secondary | ICD-10-CM | POA: Diagnosis present

## 2012-12-21 DIAGNOSIS — R0902 Hypoxemia: Secondary | ICD-10-CM

## 2012-12-21 DIAGNOSIS — I480 Paroxysmal atrial fibrillation: Secondary | ICD-10-CM

## 2012-12-21 DIAGNOSIS — I5189 Other ill-defined heart diseases: Secondary | ICD-10-CM | POA: Diagnosis present

## 2012-12-21 DIAGNOSIS — J189 Pneumonia, unspecified organism: Secondary | ICD-10-CM

## 2012-12-21 DIAGNOSIS — G4733 Obstructive sleep apnea (adult) (pediatric): Secondary | ICD-10-CM | POA: Diagnosis present

## 2012-12-21 DIAGNOSIS — K759 Inflammatory liver disease, unspecified: Secondary | ICD-10-CM

## 2012-12-21 DIAGNOSIS — G92 Toxic encephalopathy: Secondary | ICD-10-CM | POA: Diagnosis present

## 2012-12-21 DIAGNOSIS — C069 Malignant neoplasm of mouth, unspecified: Secondary | ICD-10-CM | POA: Diagnosis present

## 2012-12-21 DIAGNOSIS — I10 Essential (primary) hypertension: Secondary | ICD-10-CM

## 2012-12-21 DIAGNOSIS — C062 Malignant neoplasm of retromolar area: Secondary | ICD-10-CM | POA: Diagnosis present

## 2012-12-21 DIAGNOSIS — F419 Anxiety disorder, unspecified: Secondary | ICD-10-CM

## 2012-12-21 DIAGNOSIS — E039 Hypothyroidism, unspecified: Secondary | ICD-10-CM

## 2012-12-21 DIAGNOSIS — I2510 Atherosclerotic heart disease of native coronary artery without angina pectoris: Secondary | ICD-10-CM

## 2012-12-21 DIAGNOSIS — Z809 Family history of malignant neoplasm, unspecified: Secondary | ICD-10-CM

## 2012-12-21 DIAGNOSIS — D62 Acute posthemorrhagic anemia: Secondary | ICD-10-CM | POA: Diagnosis not present

## 2012-12-21 DIAGNOSIS — Y92009 Unspecified place in unspecified non-institutional (private) residence as the place of occurrence of the external cause: Secondary | ICD-10-CM

## 2012-12-21 DIAGNOSIS — F4489 Other dissociative and conversion disorders: Secondary | ICD-10-CM

## 2012-12-21 DIAGNOSIS — E8809 Other disorders of plasma-protein metabolism, not elsewhere classified: Secondary | ICD-10-CM | POA: Diagnosis present

## 2012-12-21 DIAGNOSIS — W010XXA Fall on same level from slipping, tripping and stumbling without subsequent striking against object, initial encounter: Secondary | ICD-10-CM | POA: Diagnosis present

## 2012-12-21 DIAGNOSIS — D649 Anemia, unspecified: Secondary | ICD-10-CM

## 2012-12-21 DIAGNOSIS — Z7901 Long term (current) use of anticoagulants: Secondary | ICD-10-CM

## 2012-12-21 DIAGNOSIS — S72009A Fracture of unspecified part of neck of unspecified femur, initial encounter for closed fracture: Principal | ICD-10-CM

## 2012-12-21 LAB — URINALYSIS, ROUTINE W REFLEX MICROSCOPIC
Glucose, UA: NEGATIVE mg/dL
Leukocytes, UA: NEGATIVE
pH: 5.5 (ref 5.0–8.0)

## 2012-12-21 LAB — COMPREHENSIVE METABOLIC PANEL
Albumin: 2.6 g/dL — ABNORMAL LOW (ref 3.5–5.2)
BUN: 29 mg/dL — ABNORMAL HIGH (ref 6–23)
Creatinine, Ser: 1.74 mg/dL — ABNORMAL HIGH (ref 0.50–1.35)
GFR calc Af Amer: 44 mL/min — ABNORMAL LOW (ref >90)
Glucose, Bld: 110 mg/dL — ABNORMAL HIGH (ref 70–99)
Total Bilirubin: 0.9 mg/dL (ref 0.3–1.2)
Total Protein: 6.2 g/dL (ref 6.0–8.3)

## 2012-12-21 LAB — TROPONIN I: Troponin I: <0.3 ng/mL (ref <0.30)

## 2012-12-21 LAB — CBC
HCT: 35 % — ABNORMAL LOW (ref 39.0–52.0)
Hemoglobin: 11.8 g/dL — ABNORMAL LOW (ref 13.0–17.0)
MCHC: 33.7 g/dL (ref 30.0–36.0)
MCV: 94.6 fL (ref 78.0–100.0)
RDW: 13.8 % (ref 11.5–15.5)

## 2012-12-21 MED ORDER — LEVOTHYROXINE SODIUM 200 MCG PO TABS
200.0000 ug | ORAL_TABLET | Freq: Every day | ORAL | Status: DC
  Administered 2012-12-22 – 2012-12-26 (×5): 200 ug via ORAL
  Filled 2012-12-21 (×7): qty 1

## 2012-12-21 MED ORDER — TECHNETIUM TO 99M ALBUMIN AGGREGATED
3.0000 | Freq: Once | INTRAVENOUS | Status: AC | PRN
  Administered 2012-12-21: 3 via INTRAVENOUS

## 2012-12-21 MED ORDER — PANTOPRAZOLE SODIUM 40 MG PO TBEC
40.0000 mg | DELAYED_RELEASE_TABLET | Freq: Every day | ORAL | Status: DC
  Administered 2012-12-21 – 2012-12-25 (×5): 40 mg via ORAL
  Filled 2012-12-21 (×6): qty 1

## 2012-12-21 MED ORDER — POTASSIUM CHLORIDE IN NACL 20-0.9 MEQ/L-% IV SOLN
INTRAVENOUS | Status: AC
  Administered 2012-12-21: 15:00:00 via INTRAVENOUS
  Filled 2012-12-21 (×2): qty 1000

## 2012-12-21 MED ORDER — MORPHINE SULFATE 2 MG/ML IJ SOLN: 0.5000 mg | INTRAMUSCULAR | Status: DC | PRN

## 2012-12-21 MED ORDER — LEVOFLOXACIN IN D5W 750 MG/150ML IV SOLN
750.0000 mg | INTRAVENOUS | Status: DC
  Administered 2012-12-21: 750 mg via INTRAVENOUS
  Filled 2012-12-21: qty 150

## 2012-12-21 MED ORDER — HYDROCODONE-ACETAMINOPHEN 5-325 MG PO TABS
1.0000 | ORAL_TABLET | Freq: Four times a day (QID) | ORAL | Status: DC | PRN
  Administered 2012-12-21 – 2012-12-22 (×3): 2 via ORAL
  Administered 2012-12-23 – 2012-12-24 (×3): 1 via ORAL
  Filled 2012-12-21 (×2): qty 1
  Filled 2012-12-21: qty 2
  Filled 2012-12-21 (×3): qty 1
  Filled 2012-12-21: qty 2
  Filled 2012-12-21 (×2): qty 1
  Filled 2012-12-21: qty 2
  Filled 2012-12-21: qty 1
  Filled 2012-12-21: qty 2
  Filled 2012-12-21: qty 1

## 2012-12-21 MED ORDER — ENOXAPARIN SODIUM 40 MG/0.4ML ~~LOC~~ SOLN
40.0000 mg | SUBCUTANEOUS | Status: DC
  Administered 2012-12-21 – 2012-12-23 (×3): 40 mg via SUBCUTANEOUS
  Filled 2012-12-21 (×4): qty 0.4

## 2012-12-21 MED ORDER — XENON XE 133 GAS
20.0000 | GAS_FOR_INHALATION | Freq: Once | RESPIRATORY_TRACT | Status: AC | PRN
  Administered 2012-12-21: 20 via RESPIRATORY_TRACT

## 2012-12-21 MED ORDER — ACYCLOVIR 400 MG PO TABS
400.0000 mg | ORAL_TABLET | Freq: Two times a day (BID) | ORAL | Status: DC
  Administered 2012-12-21 – 2012-12-26 (×11): 400 mg via ORAL
  Filled 2012-12-21 (×12): qty 1

## 2012-12-21 MED ORDER — BISOPROLOL FUMARATE 5 MG PO TABS
5.0000 mg | ORAL_TABLET | Freq: Every day | ORAL | Status: DC
  Administered 2012-12-21 – 2012-12-24 (×4): 5 mg via ORAL
  Filled 2012-12-21 (×4): qty 1

## 2012-12-21 NOTE — Progress Notes (Signed)
Patient has been SR on the monitor, but converted to Afib at 18:23.   Dr Gwenlyn Perking made aware, orders received.  Patient is asymptomatic.  Will continue to monitor.

## 2012-12-21 NOTE — Progress Notes (Signed)
72yo male c/o fall on Wednesday, now "not acting himself", O2 sats in low 80s, CXR cannot exclude PNA, to begin IV ABX.  Will begin Levaquin 750mg  IV Q48H for CrCl ~40 ml/min and monitor.  Vernard Gambles, PharmD, BCPS 12/21/2012 3:36 AM

## 2012-12-21 NOTE — Progress Notes (Signed)
Patient admitted after midnight secondary to PNA, fever and status post fall with acute non-displaced left hip fracture. Will continue tx for PNA with levaquin; hip fracture per ortho, surgery tomorrow. Please referred to admission note for further details on plan of care and admission hx.  Gary [name] [phone]

## 2012-12-21 NOTE — ED Notes (Signed)
Per ems, patient is coming from home. Pt fell Wednesday and has c/o posterior left hip pain. Pt wife stated he got up this morning to go to the bathroom and has been in the bed ever since and not acting himself since this morning. Saturations low 80's, pt becoming more alert since transport to ED, pressure WNL. 18 gauge established enroute. CBG 91.

## 2012-12-21 NOTE — Progress Notes (Signed)
Utilization Review Completed.   Artesha Wemhoff, RN, BSN Nurse Case Manager  [phone]  

## 2012-12-21 NOTE — Consult Note (Signed)
Reason for Consult:  Left hip fracture Referring Physician:   ER MD  Gary [name] is an 72 y.o. male.  HPI:   72 yo male who was brought to the ER with left hip pain following a mechanical fall that occurred Wednesday.  He was brought in also with a fever and some confusion.  Tonight he is aware of what is going on and knows he has a broken hip.  He has been admitted to Hospitalist's service.  He reports only left hip pain.  Past Medical History  Diagnosis Date  . Coronary artery disease     stent placement 2000, does not see cardiologist at this time  . Hypertension     sees Dr. Hart Carwin  . Thyroid disease   . Anxiety   . Sleep apnea     uses cpap machine  . Chronic kidney disease     renal insufficiency, [place] [facility]  . GERD (gastroesophageal reflux disease)   . Arthritis     rhematoid arthritis  . Gout     pseudo gout  . Cancer     pre-cancerous lesions removed 11/22/12 and hx of  . Anemia     "chronic anemic"  . Hepatitis     hepatitis "C"  . Chronic back pain     hx of  . 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 Anxiety   . Hypertension   . Dyslipidemia   . Hypothyroid   . CAD (coronary artery disease)     s/p stenting  . Insomnia     cpap  . Full dentures   . Hearing impairment     job related, hbilat hearing aids    Past Surgical History  Procedure Laterality Date  . Tonsillectomy    . Coronary angioplasty  2000    stent placement 2000  . Dental extractions      upper and lower    Family History  Problem Relation Age of Onset  . Coronary artery disease Brother 37  . Heart attack Brother   . Coronary artery disease Father     stents in 70's  . Cancer Mother     bone    Social History:  reports that he quit smoking about 22 years ago. He does not have any smokeless tobacco history on file. He reports that  drinks alcohol. He reports that he does not use illicit drugs.  Allergies:  Allergies  Allergen Reactions  .  Cellcept (Mycophenolate Mofetil) Other (See Comments)    Reaction unknown  . Colcrys (Colchicine) Other (See Comments)    myalgias  . Erythromycin Other (See Comments)    Reaction unknown  . Methotrexate Derivatives Hives, Itching and Rash    Only in tablet form  . Neosporin (Neomycin-Bacitracin Zn-Polymyx) Rash  . Ultram (Tramadol) Rash    Medications: I have reviewed the patient's current medications.  Results for orders placed during the hospital encounter of 12/21/12 (from the past 48 hour(s))  CBC     Status: Abnormal   Collection Time    12/21/12 12:44 AM      Result Value Range   WBC 10.6 (*) 4.0 - 10.5 K/uL   RBC 3.70 (*) 4.22 - 5.81 MIL/uL   Hemoglobin 11.8 (*) 13.0 - 17.0 g/dL   HCT 16.1 (*) 09.6 - 04.5 %   MCV 94.6  78.0 - 100.0 fL   MCH 31.9  26.0 - 34.0 pg   MCHC 33.7  30.0 - 36.0 g/dL   RDW 40.9  81.1 - 91.4 %   Platelets 134 (*) 150 - 400 K/uL  COMPREHENSIVE METABOLIC PANEL     Status: Abnormal   Collection Time    12/21/12 12:44 AM      Result Value Range   Sodium 140  135 - 145 mEq/L   Potassium 3.4 (*) 3.5 - 5.1 mEq/L   Chloride 104  96 - 112 mEq/L   CO2 26  19 - 32 mEq/L   Glucose, Bld 110 (*) 70 - 99 mg/dL   BUN 29 (*) 6 - 23 mg/dL   Creatinine, Ser 7.82 (*) 0.50 - 1.35 mg/dL   Calcium 8.8  8.4 - 95.6 mg/dL   Total Protein 6.2  6.0 - 8.3 g/dL   Albumin 2.6 (*) 3.5 - 5.2 g/dL   AST 22  0 - 37 U/L   ALT 15  0 - 53 U/L   Alkaline Phosphatase 75  39 - 117 U/L   Total Bilirubin 0.9  0.3 - 1.2 mg/dL   GFR calc non Af Amer 38 (*) >90 mL/min   GFR calc Af Amer 44 (*) >90 mL/min   Comment:            The eGFR has been calculated     using the CKD EPI equation.     This calculation has not been     validated in all clinical     situations.     eGFR's persistently     <90 mL/min signify     possible Chronic Kidney Disease.  URINALYSIS, ROUTINE W REFLEX MICROSCOPIC     Status: Abnormal   Collection Time    12/21/12 12:52 AM      Result Value Range    Color, Urine AMBER (*) YELLOW   Comment: BIOCHEMICALS MAY BE AFFECTED BY COLOR   APPearance CLEAR  CLEAR   Specific Gravity, Urine 1.024  1.005 - 1.030   pH 5.5  5.0 - 8.0   Glucose, UA NEGATIVE  NEGATIVE mg/dL   Hgb urine dipstick NEGATIVE  NEGATIVE   Bilirubin Urine SMALL (*) NEGATIVE   Ketones, ur NEGATIVE  NEGATIVE mg/dL   Protein, ur NEGATIVE  NEGATIVE mg/dL   Urobilinogen, UA 0.2  0.0 - 1.0 mg/dL   Nitrite NEGATIVE  NEGATIVE   Leukocytes, UA NEGATIVE  NEGATIVE   Comment: MICROSCOPIC NOT DONE ON URINES WITH NEGATIVE PROTEIN, BLOOD, LEUKOCYTES, NITRITE, OR GLUCOSE <1000 mg/dL.  INFLUENZA PANEL BY PCR     Status: None   Collection Time    12/21/12  2:05 AM      Result Value Range   Influenza A By PCR NEGATIVE  NEGATIVE   Influenza B By PCR NEGATIVE  NEGATIVE   H1N1 flu by pcr NOT DETECTED  NOT DETECTED   Comment:            The Xpert Flu assay (FDA approved for     nasal aspirates or washes and     nasopharyngeal swab specimens), is     intended as an aid in the diagnosis of     influenza and should not be used as     a sole basis for treatment.    Dg Chest 2 View  12/21/2012  *RADIOLOGY REPORT*  Clinical Data: Fall.  Hip pain.  Altered mental status.  Squamous cell carcinoma of the mouth.  CHEST - 2 VIEW  Comparison: Multiple exams, including 12/14/2012  Findings: Vague density in the  right upper lobe noted, possibly partially reflecting some of the density shown on recent PET CT. Underlying pneumonia is not excluded, although the density is very indistinct.  There is mild blunting of the posterior costophrenic angles.  Hazy dependent opacity noted in the lungs on the lateral projection.  Heart size is within normal limits for technique.  IMPRESSION:  1.  Vague posterior opacities in the lungs, potentially from mild atelectasis. 2.  Indistinct density in the right upper lobe, potentially reflecting the opacity shown on recent PET CT.  This should be followed to clearance.    Original Report Authenticated By: Gaylyn Rong, M.D.    Dg Hip Complete Left  12/21/2012  *RADIOLOGY REPORT*  Clinical Data: Fall.  Left hip pain.  LEFT HIP - COMPLETE 2+ VIEW  Comparison: 12/14/2012  Findings: Degenerative arthropathy of both hips noted, with associated loss of articular space and spurring along the acetabula.  Possible chondrocalcinosis of the acetabular labrum.  There is irregular calcification in the subcapital region of the left femoral head.  This could well represent spur, but nondisplaced subcapital fracture could have a similar appearance.  IMPRESSION:  1. Femoral head spur versus subtle subcapital left femoral neck fracture.  Consider MRI or CT for further characterization. 2.  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   Original Report Authenticated By: Gaylyn Rong, M.D.    Ct Head Wo Contrast  12/21/2012  *RADIOLOGY REPORT*  Clinical Data: Altered mental status.  CT HEAD WITHOUT CONTRAST  Technique:  Contiguous axial images were obtained from the base of the skull through the vertex without contrast.  Comparison: Images from the PET / CT performed 12/14/2012  Findings: There is no evidence of acute infarction, mass lesion, or intra- or extra-axial hemorrhage on CT.  Prominence of the ventricles and sulci reflects mild cortical volume loss.  Scattered periventricular and subcortical white matter change may reflect small vessel ischemic microangiopathy.  The brainstem and fourth ventricle are within normal limits.  The basal ganglia are unremarkable in appearance.  The cerebral hemispheres demonstrate grossly normal gray-white differentiation. No mass effect or midline shift is seen.  There is no evidence of fracture; visualized osseous structures are unremarkable in appearance.  The visualized portions of the orbits are within normal limits.  There is opacification of the right maxillary sinus, and partial opacification  of the right side of the sphenoid sinus and right ethmoid air cells.  The remaining paranasal sinuses and mastoid air cells are well-aerated.  No significant soft tissue abnormalities are seen.  IMPRESSION:  1.  No acute intracranial pathology seen on CT. 2.  Mild cortical volume loss and scattered small vessel ischemic microangiopathy. 3.  Opacification of the right maxillary sinus, and partial opacification of the right side of the sphenoid sinus and right ethmoid air cells.   Original Report Authenticated By: Tonia Ghent, M.D.    Ct Hip Left Wo Contrast  12/21/2012  *RADIOLOGY REPORT*  Clinical Data: Pain.  CT OF THE LEFT HIP WITHOUT CONTRAST  Technique:  Multidetector CT imaging was performed according to the standard protocol. Multiplanar CT image reconstructions were also generated.  Comparison: Plain films earlier this same date.  Findings: The patient has an acute subcapital left hip fracture. The fracture is mildly impacted with minimal anterior displacement. No other fracture is identified.  Left hip joint effusion in association with fracture is noted.  IMPRESSION: Acute subcapital fracture left hip.   Original Report Authenticated By: Maisie Fus  Maricela Curet, M.D.    Nm Pulmonary Perf And Vent  12/21/2012  *RADIOLOGY REPORT*  Clinical Data:  Shortness of breath.  NUCLEAR MEDICINE VENTILATION - PERFUSION LUNG SCAN  Technique:  Wash-in, equilibrium, and wash-out phase ventilation images were obtained using Xe-133 gas.  Perfusion images were obtained in multiple projections after intravenous injection of Tc- 34m MAA.  Radiopharmaceuticals:  20 mCi Xe-133 gas and 3 mCi Tc-38m MAA.  Comparison:  Chest plain imaging of 12/21/2012.  Findings:  Ventilation study:  On the breath-holding and equilibrium images there is expected distribution of xenon gas with no segmental or lobar ventilation defects.  There is diffuse delayed washout of xenon gas consistent with element of air trapping and obstructive pulmonary  disease.  Perfusion study:  There is expected distribution of the MAA particles.  No segmental or lobar perfusion defects are seen.  No ventilation of these mismatch is evident.  IMPRESSION: No lobar or segmental ventilation or perfusion defects are seen.  Examination is low probability for pulmonary embolism.  Delayed washout of xenon gas is seen consistent with element of obstructive pulmonary disease.   Original Report Authenticated By: Onalee Hua Call     ROS Blood pressure 105/68, pulse 83, temperature 98 F (36.7 C), temperature source Axillary, resp. rate 16, height 6' 0.84" (1.85 m), weight 83.915 kg (185 lb), SpO2 95.00%. Physical Exam  Musculoskeletal:       Left hip: He exhibits decreased range of motion, decreased strength and bony tenderness.    Assessment/Plan: Non-displaced left hip femoral neck fracture 1) To the OR tomorrow AM for cannulated screw fixation thru a small incision.  The risks and benefits have been explained to him in detail and he wishes to proceed.  Kathryne Hitch 12/21/2012, 7:40 PM

## 2012-12-21 NOTE — Progress Notes (Signed)
Patient ID: Gary [name], male   DOB: [birth year], 72 y.o.   MRN: [id number] I just reviewed his CT scan of the left hip.  It does show an acute non-displaced femoral neck fracture.  This will require surgery.  I will come and see him again later today to further discuss with him and his family.  This will require surgery in the am tomorrow for likely percutaneous pinning with cannulated screws vs a hemiarthroplasty.  He can eat from my standpoint today.

## 2012-12-21 NOTE — Progress Notes (Signed)
Patient ID: Gary [name], male   DOB: 08/27/1941, 72 y.o.   MRN: [id number] I just saw Gary [name] in the ER.  He does follow commands, but is slightly confused.  He does report left hip pain and he does have pain in that hip when I put it thru motion.  His x-rays however, are not clear cut in terms of a hip fracture.  The is a slight cortical irregularity, but not conclusive.  I will order a CT scan of his left hip today.  He can eat/drink from an ortho standpoint because if surgery is warranted, I will wait until tomorrow am pending a medical workup.  I'll follow-up with his CT scan later today and see him later again as well once more definitive plans are made.

## 2012-12-21 NOTE — H&P (Signed)
PCP:   [name], MD   Chief Complaint:  confusion  HPI: 72yo male who was walking to his shed on wed (almost 48 hours ago during the snow storm) and fell on a "cender block" and hurt his left hip.  He since that time has been having a significant amt of left hip pain and has been ambulating minimally.  Pt is mildly confused and can answer some questions but is slow to respond and unsure about some details.  His wife found him tonight more confused and running fever, she called EMS who bought him to the ER.  Wife is not present.  Pt states he has been coughing.  Denies any sob, cp, abd pain just left hip pain.  No n/v/d.  Unsure of the reliability of the history.  Has left hip fracture and fever in ED and initial oxgyen sats of around 86% on RA.  Review of Systems:  Positive and negative as per HPI otherwise all other systems are negative  Past Medical History: Past Medical History  Diagnosis Date  . Coronary artery disease     stent placement 2000, does not see cardiologist at this time  . Hypertension     sees Dr. Hart Carwin  . Thyroid disease   . Anxiety   . Sleep apnea     uses cpap machine  . Chronic kidney disease     renal insufficiency, [place] [facility]  . GERD (gastroesophageal reflux disease)   . Arthritis     rhematoid arthritis  . Gout     pseudo gout  . Cancer     pre-cancerous lesions removed 11/22/12 and hx of  . Anemia     "chronic anemic"  . Hepatitis     hepatitis "C"  . Chronic back pain     hx of  . 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 Anxiety   . Hypertension   . Dyslipidemia   . Hypothyroid   . CAD (coronary artery disease)     s/p stenting  . Insomnia     cpap  . Full dentures   . Hearing impairment     job related, hbilat hearing aids   Past Surgical History  Procedure Laterality Date  . Tonsillectomy    . Coronary angioplasty  2000    stent placement 2000  . Dental extractions      upper and lower     Medications: Prior to Admission medications   Medication Sig Start Date End Date Taking? Authorizing Provider  acyclovir (ZOVIRAX) 400 MG tablet Take 400 mg by mouth 2 (two) times daily.    Historical Provider, MD  ALPRAZolam Prudy Feeler) 0.5 MG tablet Take 0.5 mg by mouth at bedtime as needed. For anxiety    Historical Provider, MD  aspirin EC 81 MG tablet Take 81 mg by mouth daily.    Historical Provider, MD  bisoprolol (ZEBETA) 5 MG tablet Take 5 mg by mouth daily.    Historical Provider, MD  Calcium Carb-Cholecalciferol (CALCIUM-VITAMIN D) 600-400 MG-UNIT TABS Take 1 tablet by mouth daily.    Historical Provider, MD  cetirizine (ZYRTEC) 10 MG tablet Take 10 mg by mouth daily.    Historical Provider, MD  Cholecalciferol (VITAMIN D) 1000 UNITS capsule Take 1,000 Units by mouth daily.    Historical Provider, MD  desonide (DESOWEN) 0.05 % cream Apply 1 application topically 2 (two) times daily.    Historical Provider, MD  Dexamethasone (OZURDEX) 0.7 MG IMPL  Inject 1 each into the eye every 6 (six) months.    Historical Provider, MD  diazepam (VALIUM) 5 MG tablet Take 5 mg by mouth every 12 (twelve) hours as needed. For anxiety    Historical Provider, MD  Glucosamine-Chondroit-Vit C-Mn (GLUCOSAMINE 1500 COMPLEX PO) Take 1 capsule by mouth daily.    Historical Provider, MD  levothyroxine (SYNTHROID, LEVOTHROID) 200 MCG tablet Take 200 mcg by mouth daily.    Historical Provider, MD  metroNIDAZOLE (METROGEL) 1 % gel Apply 1 application topically daily.    Historical Provider, MD  Multiple Vitamin (MULTIVITAMIN WITH MINERALS) TABS Take 1 tablet by mouth daily.    Historical Provider, MD  mupirocin ointment (BACTROBAN) 2 % Apply 1 application topically 2 (two) times daily as needed. For irritation    Historical Provider, MD  omega-3 acid ethyl esters (LOVAZA) 1 G capsule Take 2 g by mouth 2 (two) times daily.    Historical Provider, MD  oxyCODONE (ROXICODONE) 15 MG immediate release tablet Take 15 mg  by mouth every 4 (four) hours as needed. For pain    Historical Provider, MD  pantoprazole (PROTONIX) 40 MG tablet Take 40 mg by mouth daily.    Historical Provider, MD  polyethylene glycol (MIRALAX / GLYCOLAX) packet Take 17 g by mouth daily.    Historical Provider, MD  Potassium 99 MG TABS Take 1 tablet by mouth daily.    Historical Provider, MD  predniSONE (STERAPRED UNI-PAK) 5 MG TABS Take 5 mg by mouth daily.    Historical Provider, MD  simvastatin (ZOCOR) 40 MG tablet Take 40 mg by mouth every evening.    Historical Provider, MD  triamcinolone cream (KENALOG) 0.1 % Apply topically 2 (two) times daily.    Historical Provider, MD  Zinc 30 MG CAPS Take 1 capsule by mouth daily.    Historical Provider, MD  zolpidem (AMBIEN) 10 MG tablet Take 10 mg by mouth at bedtime as needed. For sleep    Historical Provider, MD    Allergies:   Allergies  Allergen Reactions  . Cellcept (Mycophenolate Mofetil) Other (See Comments)    Reaction unknown  . Colcrys (Colchicine) Other (See Comments)    myalgias  . Erythromycin Other (See Comments)    Reaction unknown  . Methotrexate Derivatives Hives, Itching and Rash    Only in tablet form  . Neosporin (Neomycin-Bacitracin Zn-Polymyx) Rash  . Ultram (Tramadol) Rash    Social History:  reports that he quit smoking about 22 years ago. He does not have any smokeless tobacco history on file. He reports that  drinks alcohol. He reports that he does not use illicit drugs.  Family History: Family History  Problem Relation Age of Onset  . Coronary artery disease Brother 89  . Heart attack Brother   . Coronary artery disease Father     stents in 70's  . Cancer Mother     bone    Physical Exam: Filed Vitals:   12/21/12 0159 12/21/12 0300 12/21/12 0330 12/21/12 0438  BP: 111/55 104/54 109/61 117/57  Pulse: 65 62 62 60  Temp: 101.6 F (38.7 C)   100.3 F (37.9 C)  TempSrc: Oral   Oral  Resp: 16   18  Height:  6' 0.83" (1.85 m)    Weight:   83.915 kg (185 lb)    SpO2: 92% 90% 92% 93%   General appearance: alert, cooperative and no distress mmm dry and clinically dehydrated Neck: no JVD and supple, symmetrical, trachea midline Lungs: clear  to auscultation bilaterally Heart: regular rate and rhythm, S1, S2 normal, no murmur, click, rub or gallop Abdomen: soft, non-tender; bowel sounds normal; no masses,  no organomegaly Extremities: extremities normal, atraumatic, no cyanosis or edema  Pain with movement of left hip Pulses: 2+ and symmetric Skin: Skin color, texture, turgor normal. No rashes or lesions Neurologic: Grossly normal    Labs on Admission:   Recent Labs  12/21/12 0044  NA 140  K 3.4*  CL 104  CO2 26  GLUCOSE 110*  BUN 29*  CREATININE 1.74*  CALCIUM 8.8    Recent Labs  12/21/12 0044  AST 22  ALT 15  ALKPHOS 75  BILITOT 0.9  PROT 6.2  ALBUMIN 2.6*    Recent Labs  12/21/12 0044  WBC 10.6*  HGB 11.8*  HCT 35.0*  MCV 94.6  PLT 134*    Radiological Exams on Admission: Dg Chest 2 View  12/21/2012  *RADIOLOGY REPORT*  Clinical Data: Fall.  Hip pain.  Altered mental status.  Squamous cell carcinoma of the mouth.  CHEST - 2 VIEW  Comparison: Multiple exams, including 12/14/2012  Findings: Vague density in the right upper lobe noted, possibly partially reflecting some of the density shown on recent PET CT. Underlying pneumonia is not excluded, although the density is very indistinct.  There is mild blunting of the posterior costophrenic angles.  Hazy dependent opacity noted in the lungs on the lateral projection.  Heart size is within normal limits for technique.  IMPRESSION:  1.  Vague posterior opacities in the lungs, potentially from mild atelectasis. 2.  Indistinct density in the right upper lobe, potentially reflecting the opacity shown on recent PET CT.  This should be followed to clearance.   Original Report Authenticated By: Gaylyn Rong, M.D.    Dg Hip Complete Left  12/21/2012   *RADIOLOGY REPORT*  Clinical Data: Fall.  Left hip pain.  LEFT HIP - COMPLETE 2+ VIEW  Comparison: 12/14/2012  Findings: Degenerative arthropathy of both hips noted, with associated loss of articular space and spurring along the acetabula.  Possible chondrocalcinosis of the acetabular labrum.  There is irregular calcification in the subcapital region of the left femoral head.  This could well represent spur, but nondisplaced subcapital fracture could have a similar appearance.  IMPRESSION:  1. Femoral head spur versus subtle subcapital left femoral neck fracture.  Consider MRI or CT for further characterization. 2.  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   Original Report Authenticated By: Gaylyn Rong, M.D.    Ct Head Wo Contrast  12/21/2012  *RADIOLOGY REPORT*  Clinical Data: Altered mental status.  CT HEAD WITHOUT CONTRAST  Technique:  Contiguous axial images were obtained from the base of the skull through the vertex without contrast.  Comparison: Images from the PET / CT performed 12/14/2012  Findings: There is no evidence of acute infarction, mass lesion, or intra- or extra-axial hemorrhage on CT.  Prominence of the ventricles and sulci reflects mild cortical volume loss.  Scattered periventricular and subcortical white matter change may reflect small vessel ischemic microangiopathy.  The brainstem and fourth ventricle are within normal limits.  The basal ganglia are unremarkable in appearance.  The cerebral hemispheres demonstrate grossly normal gray-white differentiation. No mass effect or midline shift is seen.  There is no evidence of fracture; visualized osseous structures are unremarkable in appearance.  The visualized portions of the orbits are within normal limits.  There is opacification of the right maxillary  sinus, and partial opacification of the right side of the sphenoid sinus and right ethmoid air cells.  The remaining paranasal  sinuses and mastoid air cells are well-aerated.  No significant soft tissue abnormalities are seen.  IMPRESSION:  1.  No acute intracranial pathology seen on CT. 2.  Mild cortical volume loss and scattered small vessel ischemic microangiopathy. 3.  Opacification of the right maxillary sinus, and partial opacification of the right side of the sphenoid sinus and right ethmoid air cells.   Original Report Authenticated By: Tonia Ghent, M.D.    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  NUCLEAR MEDICINE PET SKULL BASE TO THIGH  Fasting Blood Glucose:  99  Technique:  18.1 mCi F-18 FDG was injected intravenously. CT data was obtained and used for attenuation correction and anatomic localization only.  (This was not acquired as a diagnostic CT examination.) Additional exam technical data entered on technologist worksheet.  Comparison:  Neck CT 10/12/2012.  Findings:  Neck: There is intensely hypermetabolic activity within the right lateral oropharynx corresponding with the asymmetric soft tissue fullness and enhancement on prior CT.  This has an SUV max of 20.7. No other abnormal activity is seen associated with the pharyngeal mucosal space.  There are small level II lymph nodes on the right which are slightly hypermetabolic (SUV max 4.4).  These are not pathologically enlarged.  There are no other hypermetabolic cervical lymph nodes.  Chest:  There is a hypermetabolic right apical nodule which measures 2.3 x 1.4 cm on image 67.  This has an SUV max of 8.0.  No nodule was present in this area on the CT performed 2 months ago. There is additional hypermetabolic patchy airspace disease dependently in the right lower lobe.  This is less discrete and has an SUV max of 7.5.  There is no abnormal metabolic activity within the left lung.  There are no hypermetabolic mediastinal or hilar  lymph nodes.  Calcified right hilar lymph nodes and coronary artery calcifications are noted.  Abdomen/Pelvis:  No abnormal hypermetabolic activity within the liver, pancreas, adrenal glands, or spleen.  No hypermetabolic lymph nodes in the abdomen or pelvis.  Skeleton:  No focal hypermetabolic activity to suggest skeletal metastasis. There is mild asymmetric degenerative activity associated with the right acromioclavicular joint.  IMPRESSION:  1.  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  There are small asymmetric level II lymph nodes on the right which are mildly hypermetabolic and could reflect metastases. 2.  No other lesions of the pharyngeal mucosal space identified. 3.  Right lung apical nodule and lower lobe air space disease with associated hypermetabolic activity.  Both lung apices were included on the neck CT performed 2 months ago and appeared normal at that time.  Therefore, these findings are likely all inflammatory (pneumonia). Short-term radiographic followup is recommended to exclude a developing apical tumor.   Original Report Authenticated By: Carey Bullocks, M.D.     Assessment/Plan  72 yo male mech fall in snow almost 48 hours ago with hip pain now with fever, hypoxia and found to have left hip fracture with prob pna ???PE Principal Problem:   Fever Active Problems:   Hypertension   Chronic kidney disease   CAD (coronary artery disease)   Hypoxia   Hip fracture, left   Confusion state oral cancer  Admit to tele floor.  ua and  cxr not impressive.  Pt at risk for both asp pna and PE.  Cover with levaquin and ck vq scan to r/o PE (ckd is at baseline cr 1.7).  ED has called ortho for his hip fracture.  Ck ekg preop.  Gentle ivf.  cth neg for acute abnormality.  Morphine prn.  Presumptive full code.  Rashaunda Rahl A 12/21/2012, 5:38 AM

## 2012-12-22 ENCOUNTER — Encounter (HOSPITAL_COMMUNITY): Payer: Self-pay | Admitting: Anesthesiology

## 2012-12-22 ENCOUNTER — Inpatient Hospital Stay (HOSPITAL_COMMUNITY): Payer: Medicare Other | Admitting: Anesthesiology

## 2012-12-22 ENCOUNTER — Encounter (HOSPITAL_COMMUNITY)
Admission: EM | Disposition: A | Discharge status: Skilled Nursing Facility | Payer: Self-pay | Source: Home / Self Care | Attending: Internal Medicine

## 2012-12-22 ENCOUNTER — Inpatient Hospital Stay (HOSPITAL_COMMUNITY): Payer: Medicare Other

## 2012-12-22 DIAGNOSIS — I10 Essential (primary) hypertension: Secondary | ICD-10-CM

## 2012-12-22 DIAGNOSIS — E039 Hypothyroidism, unspecified: Secondary | ICD-10-CM

## 2012-12-22 DIAGNOSIS — K219 Gastro-esophageal reflux disease without esophagitis: Secondary | ICD-10-CM

## 2012-12-22 HISTORY — PX: HIP PINNING,CANNULATED: SHX1758

## 2012-12-22 LAB — CBC WITH DIFFERENTIAL/PLATELET
Eosinophils Absolute: 0.4 10*3/uL (ref 0.0–0.7)
Hemoglobin: 10.5 g/dL — ABNORMAL LOW (ref 13.0–17.0)
Lymphocytes Relative: 16 % (ref 12–46)
Lymphs Abs: 1.2 10*3/uL (ref 0.7–4.0)
MCH: 31.1 pg (ref 26.0–34.0)
Monocytes Relative: 5 % (ref 3–12)
Neutro Abs: 5.7 10*3/uL (ref 1.7–7.7)
Neutrophils Relative %: 75 % (ref 43–77)
RBC: 3.38 MIL/uL — ABNORMAL LOW (ref 4.22–5.81)
WBC: 7.6 10*3/uL (ref 4.0–10.5)

## 2012-12-22 LAB — URINE CULTURE
Colony Count: NO GROWTH
Culture: NO GROWTH

## 2012-12-22 LAB — SURGICAL PCR SCREEN: Staphylococcus aureus: POSITIVE — AB

## 2012-12-22 LAB — BASIC METABOLIC PANEL
GFR calc non Af Amer: 49 mL/min — ABNORMAL LOW (ref >90)
Glucose, Bld: 96 mg/dL (ref 70–99)
Potassium: 3.6 mEq/L (ref 3.5–5.1)
Sodium: 139 mEq/L (ref 135–145)

## 2012-12-22 SURGERY — FIXATION, FEMUR, NECK, PERCUTANEOUS, USING SCREW
Anesthesia: General | Site: Hip | Laterality: Left | Wound class: Clean

## 2012-12-22 MED ORDER — POLYETHYLENE GLYCOL 3350 17 G PO PACK
17.0000 g | PACK | Freq: Every day | ORAL | Status: DC
  Administered 2012-12-22 – 2012-12-23 (×2): 17 g via ORAL
  Filled 2012-12-22 (×5): qty 1

## 2012-12-22 MED ORDER — ONDANSETRON HCL 4 MG/2ML IJ SOLN: 4.0000 mg | Freq: Four times a day (QID) | INTRAMUSCULAR | Status: DC | PRN

## 2012-12-22 MED ORDER — METHOCARBAMOL 100 MG/ML IJ SOLN
500.0000 mg | Freq: Four times a day (QID) | INTRAVENOUS | Status: DC | PRN
  Filled 2012-12-22: qty 5

## 2012-12-22 MED ORDER — LACTATED RINGERS IV SOLN
INTRAVENOUS | Status: DC | PRN
  Administered 2012-12-22: 07:00:00 via INTRAVENOUS

## 2012-12-22 MED ORDER — DEXTROSE 5 % IV SOLN
INTRAVENOUS | Status: DC | PRN
  Administered 2012-12-22: 08:00:00 via INTRAVENOUS

## 2012-12-22 MED ORDER — PHENOL 1.4 % MT LIQD
1.0000 | OROMUCOSAL | Status: DC | PRN
  Filled 2012-12-22: qty 177

## 2012-12-22 MED ORDER — LIDOCAINE HCL (CARDIAC) 20 MG/ML IV SOLN
INTRAVENOUS | Status: DC | PRN
  Administered 2012-12-22: 50 mg via INTRAVENOUS

## 2012-12-22 MED ORDER — 0.9 % SODIUM CHLORIDE (POUR BTL) OPTIME
TOPICAL | Status: DC | PRN
  Administered 2012-12-22: 100 mL

## 2012-12-22 MED ORDER — ZOLPIDEM TARTRATE 5 MG PO TABS: 5.0000 mg | ORAL_TABLET | Freq: Every evening | ORAL | Status: DC | PRN

## 2012-12-22 MED ORDER — FERROUS SULFATE 325 (65 FE) MG PO TABS
325.0000 mg | ORAL_TABLET | Freq: Three times a day (TID) | ORAL | Status: DC
  Administered 2012-12-23 – 2012-12-26 (×8): 325 mg via ORAL
  Filled 2012-12-22 (×13): qty 1

## 2012-12-22 MED ORDER — ACETAMINOPHEN 325 MG PO TABS
650.0000 mg | ORAL_TABLET | Freq: Four times a day (QID) | ORAL | Status: DC | PRN
  Administered 2012-12-25: 650 mg via ORAL
  Filled 2012-12-22 (×2): qty 2

## 2012-12-22 MED ORDER — OXYCODONE HCL 5 MG PO TABS
5.0000 mg | ORAL_TABLET | Freq: Once | ORAL | Status: AC | PRN
  Administered 2012-12-22: 5 mg via ORAL
  Filled 2012-12-22: qty 1

## 2012-12-22 MED ORDER — DORZOLAMIDE HCL-TIMOLOL MAL 2-0.5 % OP SOLN
1.0000 [drp] | Freq: Two times a day (BID) | OPHTHALMIC | Status: DC
  Administered 2012-12-22 – 2012-12-25 (×7): 1 [drp] via OPHTHALMIC
  Filled 2012-12-22: qty 10

## 2012-12-22 MED ORDER — PREDNISONE 5 MG PO TABS
5.0000 mg | ORAL_TABLET | Freq: Every day | ORAL | Status: DC
  Administered 2012-12-22 – 2012-12-26 (×5): 5 mg via ORAL
  Filled 2012-12-22 (×5): qty 1

## 2012-12-22 MED ORDER — OXYCODONE HCL 5 MG/5ML PO SOLN: 5.0000 mg | Freq: Once | ORAL | Status: AC | PRN

## 2012-12-22 MED ORDER — ASPIRIN EC 325 MG PO TBEC
325.0000 mg | DELAYED_RELEASE_TABLET | Freq: Every day | ORAL | Status: DC
  Administered 2012-12-23 – 2012-12-25 (×3): 325 mg via ORAL
  Filled 2012-12-22 (×3): qty 1

## 2012-12-22 MED ORDER — CEFAZOLIN SODIUM 1-5 GM-% IV SOLN
INTRAVENOUS | Status: DC | PRN
  Administered 2012-12-22: 1 g via INTRAVENOUS

## 2012-12-22 MED ORDER — OXYCODONE HCL 5 MG PO TABS: 5.0000 mg | ORAL_TABLET | Freq: Once | ORAL | Status: DC | PRN

## 2012-12-22 MED ORDER — GLYCOPYRROLATE 0.2 MG/ML IJ SOLN
INTRAMUSCULAR | Status: DC | PRN
  Administered 2012-12-22: 0.4 mg via INTRAVENOUS

## 2012-12-22 MED ORDER — ONDANSETRON HCL 4 MG PO TABS: 4.0000 mg | ORAL_TABLET | Freq: Four times a day (QID) | ORAL | Status: DC | PRN

## 2012-12-22 MED ORDER — METOCLOPRAMIDE HCL 5 MG PO TABS
5.0000 mg | ORAL_TABLET | Freq: Three times a day (TID) | ORAL | Status: DC | PRN
  Filled 2012-12-22: qty 2

## 2012-12-22 MED ORDER — MENTHOL 3 MG MT LOZG
1.0000 | LOZENGE | OROMUCOSAL | Status: DC | PRN
  Filled 2012-12-22: qty 9

## 2012-12-22 MED ORDER — ACETAMINOPHEN 650 MG RE SUPP: 650.0000 mg | Freq: Four times a day (QID) | RECTAL | Status: DC | PRN

## 2012-12-22 MED ORDER — METOCLOPRAMIDE HCL 5 MG/ML IJ SOLN
5.0000 mg | Freq: Three times a day (TID) | INTRAMUSCULAR | Status: DC | PRN
  Filled 2012-12-22: qty 2

## 2012-12-22 MED ORDER — NEOSTIGMINE METHYLSULFATE 1 MG/ML IJ SOLN
INTRAMUSCULAR | Status: DC | PRN
  Administered 2012-12-22: 3 mg via INTRAVENOUS

## 2012-12-22 MED ORDER — ALUM & MAG HYDROXIDE-SIMETH 200-200-20 MG/5ML PO SUSP: 30.0000 mL | ORAL | Status: DC | PRN

## 2012-12-22 MED ORDER — ROCURONIUM BROMIDE 100 MG/10ML IV SOLN
INTRAVENOUS | Status: DC | PRN
  Administered 2012-12-22: 40 mg via INTRAVENOUS

## 2012-12-22 MED ORDER — MORPHINE SULFATE 2 MG/ML IJ SOLN: 0.5000 mg | INTRAMUSCULAR | Status: DC | PRN

## 2012-12-22 MED ORDER — DOCUSATE SODIUM 100 MG PO CAPS
100.0000 mg | ORAL_CAPSULE | Freq: Two times a day (BID) | ORAL | Status: DC
  Administered 2012-12-22 – 2012-12-26 (×7): 100 mg via ORAL
  Filled 2012-12-22 (×9): qty 1

## 2012-12-22 MED ORDER — HYDROCODONE-ACETAMINOPHEN 5-325 MG PO TABS
1.0000 | ORAL_TABLET | Freq: Four times a day (QID) | ORAL | Status: DC | PRN
  Administered 2012-12-24 – 2012-12-26 (×6): 1 via ORAL

## 2012-12-22 MED ORDER — METHOCARBAMOL 500 MG PO TABS
500.0000 mg | ORAL_TABLET | Freq: Four times a day (QID) | ORAL | Status: DC | PRN
  Administered 2012-12-24 – 2012-12-25 (×2): 500 mg via ORAL
  Filled 2012-12-22 (×4): qty 1

## 2012-12-22 MED ORDER — LEVOFLOXACIN IN D5W 750 MG/150ML IV SOLN
750.0000 mg | INTRAVENOUS | Status: DC
  Administered 2012-12-22 – 2012-12-25 (×4): 750 mg via INTRAVENOUS
  Filled 2012-12-22 (×5): qty 150

## 2012-12-22 MED ORDER — HYDROMORPHONE HCL PF 1 MG/ML IJ SOLN: 0.2500 mg | INTRAMUSCULAR | Status: DC | PRN

## 2012-12-22 MED ORDER — CEFAZOLIN SODIUM-DEXTROSE 2-3 GM-% IV SOLR
2.0000 g | Freq: Four times a day (QID) | INTRAVENOUS | Status: AC
  Administered 2012-12-22 – 2012-12-23 (×2): 2 g via INTRAVENOUS
  Filled 2012-12-22 (×2): qty 50

## 2012-12-22 MED ORDER — OXYCODONE HCL 5 MG/5ML PO SOLN: 5.0000 mg | Freq: Once | ORAL | Status: DC | PRN

## 2012-12-22 MED ORDER — FENTANYL CITRATE 0.05 MG/ML IJ SOLN
INTRAMUSCULAR | Status: DC | PRN
  Administered 2012-12-22 (×3): 50 ug via INTRAVENOUS

## 2012-12-22 MED ORDER — PROPOFOL 10 MG/ML IV BOLUS
INTRAVENOUS | Status: DC | PRN
  Administered 2012-12-22: 120 mg via INTRAVENOUS

## 2012-12-22 MED ORDER — SODIUM CHLORIDE 0.9 % IV SOLN
INTRAVENOUS | Status: DC
  Administered 2012-12-22: 20:00:00 via INTRAVENOUS

## 2012-12-22 SURGICAL SUPPLY — 34 items
BIT DRILL 5 ACE CANN QC (BIT) ×3 IMPLANT
CLOTH BEACON ORANGE TIMEOUT ST (SAFETY) ×3 IMPLANT
COVER SURGICAL LIGHT HANDLE (MISCELLANEOUS) ×3 IMPLANT
DRAPE STERI IOBAN 125X83 (DRAPES) ×3 IMPLANT
DRSG MEPILEX BORDER 4X4 (GAUZE/BANDAGES/DRESSINGS) IMPLANT
DRSG MEPILEX BORDER 4X8 (GAUZE/BANDAGES/DRESSINGS) ×3 IMPLANT
DURAPREP 26ML APPLICATOR (WOUND CARE) ×3 IMPLANT
ELECT REM PT RETURN 9FT ADLT (ELECTROSURGICAL) ×3
ELECTRODE REM PT RTRN 9FT ADLT (ELECTROSURGICAL) ×2 IMPLANT
GLOVE BIO SURGEON STRL SZ7.5 (GLOVE) ×3 IMPLANT
GLOVE BIOGEL PI IND STRL 8 (GLOVE) ×2 IMPLANT
GLOVE BIOGEL PI INDICATOR 8 (GLOVE) ×1
GLOVE NEODERM STER SZ 7 (GLOVE) ×3 IMPLANT
GLOVE ORTHO TXT STRL SZ7.5 (GLOVE) ×3 IMPLANT
GOWN PREVENTION PLUS LG XLONG (DISPOSABLE) IMPLANT
GOWN PREVENTION PLUS XLARGE (GOWN DISPOSABLE) ×3 IMPLANT
GOWN STRL NON-REIN LRG LVL3 (GOWN DISPOSABLE) ×3 IMPLANT
KIT ROOM TURNOVER OR (KITS) ×3 IMPLANT
NS IRRIG 1000ML POUR BTL (IV SOLUTION) ×3 IMPLANT
PACK GENERAL/GYN (CUSTOM PROCEDURE TRAY) ×3 IMPLANT
PAD ARMBOARD 7.5X6 YLW CONV (MISCELLANEOUS) ×6 IMPLANT
PIN THREADED GUIDE ACE (PIN) ×9 IMPLANT
SCREW CANN 105X22X6.5 (Screw) ×2 IMPLANT
SCREW CANN 6.5 105MM (Screw) ×1 IMPLANT
SCREW CANN 6.5 95MM (Screw) ×2 IMPLANT
SCREW CANN LG 6.5 FLT 95X22 (Screw) ×4 IMPLANT
STAPLER VISISTAT 35W (STAPLE) ×3 IMPLANT
SUT VIC AB 0 CT1 27 (SUTURE) ×1
SUT VIC AB 0 CT1 27XBRD ANBCTR (SUTURE) ×2 IMPLANT
SUT VIC AB 2-0 CT1 27 (SUTURE) ×1
SUT VIC AB 2-0 CT1 TAPERPNT 27 (SUTURE) ×2 IMPLANT
TOWEL OR 17X24 6PK STRL BLUE (TOWEL DISPOSABLE) ×3 IMPLANT
TOWEL OR 17X26 10 PK STRL BLUE (TOWEL DISPOSABLE) ×3 IMPLANT
WATER STERILE IRR 1000ML POUR (IV SOLUTION) ×3 IMPLANT

## 2012-12-22 NOTE — Anesthesia Postprocedure Evaluation (Signed)
  Anesthesia Post-op Note  Patient: Gary [name]  Procedure(s) Performed: Procedure(s): CANNULATED HIP PINNING (Left)  Patient Location: PACU  Anesthesia Type:General  Level of Consciousness: awake and alert   Airway and Oxygen Therapy: Patient Spontanous Breathing  Post-op Pain: mild  Post-op Assessment: Post-op Vital signs reviewed, Patient's Cardiovascular Status Stable, Respiratory Function Stable, Patent Airway and No signs of Nausea or vomiting  Post-op Vital Signs: Reviewed and stable  Complications: No apparent anesthesia complications

## 2012-12-22 NOTE — Anesthesia Preprocedure Evaluation (Addendum)
Anesthesia Evaluation  Patient identified by MRN, date of birth, ID band Patient awake    Reviewed: Allergy & Precautions, H&P , NPO status , Patient's Chart, lab work & pertinent test results  Airway Mallampati: II TM Distance: >3 FB Neck ROM: Full    Dental no notable dental hx. (+) Edentulous Upper, Edentulous Lower and Dental Advisory Given   Pulmonary sleep apnea , former smoker,  breath sounds clear to auscultation  Pulmonary exam normal       Cardiovascular Exercise Tolerance: Poor hypertension, On Medications and On Home Beta Blockers + CAD and + Cardiac Stents Rhythm:Regular Rate:Normal     Neuro/Psych PSYCHIATRIC DISORDERS negative neurological ROS     GI/Hepatic GERD-  Medicated and Controlled,(+) Hepatitis -, C  Endo/Other  Hypothyroidism   Renal/GU Renal InsufficiencyRenal disease  negative genitourinary   Musculoskeletal  (+) Arthritis -, Osteoarthritis,    Abdominal   Peds  Hematology negative hematology ROS (+)   Anesthesia Other Findings Oral Cancer apparent in oropharynx. No RX yet.  Reproductive/Obstetrics negative OB ROS                         Anesthesia Physical Anesthesia Plan  ASA: III  Anesthesia Plan: General   Post-op Pain Management:    Induction: Intravenous  Airway Management Planned: Oral ETT  Additional Equipment:   Intra-op Plan:   Post-operative Plan: Extubation in OR  Informed Consent: I have reviewed the patients History and Physical, chart, labs and discussed the procedure including the risks, benefits and alternatives for the proposed anesthesia with the patient or authorized representative who has indicated his/her understanding and acceptance.   Dental advisory given  Plan Discussed with: CRNA  Anesthesia Plan Comments:         Anesthesia Quick Evaluation

## 2012-12-22 NOTE — Transfer of Care (Signed)
Immediate Anesthesia Transfer of Care Note  Patient: Gary [name]  Procedure(s) Performed: Procedure(s): CANNULATED HIP PINNING (Left)  Patient Location: PACU  Anesthesia Type:General  Level of Consciousness: awake, alert , oriented and patient cooperative  Airway & Oxygen Therapy: Patient Spontanous Breathing and Patient connected to nasal cannula oxygen  Post-op Assessment: Report given to PACU RN and Post -op Vital signs reviewed and stable  Post vital signs: Reviewed and stable  Complications: No apparent anesthesia complications

## 2012-12-22 NOTE — Progress Notes (Signed)
ANTIBIOTIC CONSULT NOTE - FOLLOW UP  Pharmacy Consult for Levofloxacin Indication: pneumonia  Allergies  Allergen Reactions  . Cellcept (Mycophenolate Mofetil) Other (See Comments)    Reaction unknown  . Colcrys (Colchicine) Other (See Comments)    myalgias  . Erythromycin Other (See Comments)    Reaction unknown  . Methotrexate Derivatives Hives, Itching and Rash    Only in tablet form  . Neosporin (Neomycin-Bacitracin Zn-Polymyx) Rash  . Ultram (Tramadol) Rash    Patient Measurements: Height: 6' 0.83" (185 cm) Weight: 181 lb (82.1 kg) IBW/kg (Calculated) : 79.52  Vital Signs: Temp: 97.8 F (36.6 C) (02/15 0851) Temp src: Oral (02/15 0533) BP: 139/65 mmHg (02/15 0930) Pulse Rate: 64 (02/15 0533) Intake/Output from previous day: 02/14 0701 - 02/15 0700 In: 213.8 [I.V.:213.8] Out: -  Intake/Output from this shift: Total I/O In: 550 [I.V.:550] Out: 25 [Blood:25]  Labs:  Recent Labs  12/21/12 0044 12/22/12 0600  WBC 10.6* 7.6  HGB 11.8* 10.5*  PLT 134* 123*  CREATININE 1.74* 1.39*   Estimated Creatinine Clearance: 54.8 ml/min (by C-G formula based on Cr of 1.39). No results found for this basename: VANCOTROUGH, Leodis Binet, VANCORANDOM, GENTTROUGH, GENTPEAK, GENTRANDOM, TOBRATROUGH, TOBRAPEAK, TOBRARND, AMIKACINPEAK, AMIKACINTROU, AMIKACIN,  in the last 72 hours   Microbiology: Recent Results (from the past 720 hour(s))  URINE CULTURE     Status: None   Collection Time    12/21/12 12:52 AM      Result Value Range Status   Specimen Description URINE, CATHETERIZED   Final   Special Requests NONE   Final   Culture  Setup Time 12/21/2012 02:06   Final   Colony Count NO GROWTH   Final   Culture NO GROWTH   Final   Report Status 12/22/2012 FINAL   Final  SURGICAL PCR SCREEN     Status: Abnormal   Collection Time    12/22/12  3:24 AM      Result Value Range Status   MRSA, PCR NEGATIVE  NEGATIVE Final   Staphylococcus aureus POSITIVE (*) NEGATIVE Final   Comment:            The Xpert SA Assay (FDA     approved for NASAL specimens     in patients over 31 years of age),     is one component of     a comprehensive surveillance     program.  Test performance has     been validated by [employer] for patients greater     than or equal to 49 year old.     It is not intended     to diagnose infection nor to     guide or monitor treatment.    Anti-infectives   Start     Dose/Rate Route Frequency Ordered Stop   12/21/12 1000  acyclovir (ZOVIRAX) tablet 400 mg     400 mg Oral 2 times daily 12/21/12 0901     12/21/12 0400  levofloxacin (LEVAQUIN) IVPB 750 mg     750 mg 100 mL/hr over 90 Minutes Intravenous Every 48 hours 12/21/12 0334       Assessment:  72 yo M admitted  after a fall at home and now s/p hip pinning 2/15.  Pharmacy consulted to dose antibiotics  Infectious Disease: PNA, TM 100, afebrile, WBC now wnl  Antibiotics: 2/14 LQ>>  2/14 Acyclovir 400 bid Cultures: MRSA swab positive, 2/14 urine neg. 2/14 blood x 2 pending, 2/14 sputum  Renal: SCr  improved with hydration, CrCl 54 ml/min  Goal of Therapy:  Renal adjustment of antibiotics.  Plan:  1. Levofloxacin 750 mg IV q24h. 2. Follow up SCr, UOP, cultures, clinical course and adjust as clinically indicated.   Rishabh Rinkenberger Christine Virginia Crews 12/22/2012,9:49 AM

## 2012-12-22 NOTE — Progress Notes (Signed)
Patient ID: Gary [name], male   DOB: 1941-06-04, 72 y.o.   MRN: [id number] PATIENT ID: Gary [name]        MRN:  [id number]          DOB/AGE: 06-May-1941 / 72 y.o.    Gary Campbell, MD   [name], PA-C [address] [place], [place]  [address]                             [phone]   PROGRESS NOTE  Subjective:  Pt confused on rounds-did have pain with movement of left hip as expected post op   Objective: Vital signs in last 24 hours:   Patient Vitals for the past 24 hrs:  BP Temp Temp src Pulse Resp SpO2 Weight  12/22/12 0945 149/71 mmHg 97.4 F (36.3 C) - 53 13 96 % -  12/22/12 0930 139/65 mmHg - - - - - -  12/22/12 0915 142/67 mmHg - - - - - -  12/22/12 0900 154/67 mmHg - - - - - -  12/22/12 0851 145/67 mmHg 97.8 F (36.6 C) - - 11 92 % -  12/22/12 0533 118/69 mmHg 97.8 F (36.6 C) Oral 64 18 96 % 82.1 kg (181 lb)  12/22/12 0213 110/62 mmHg 97.9 F (36.6 C) Oral 59 18 94 % -  12/21/12 2050 112/65 mmHg 98.1 F (36.7 C) Oral 76 18 100 % -  12/21/12 1843 105/68 mmHg 98 F (36.7 C) Axillary 83 16 95 % -  12/21/12 1359 160/80 mmHg 99.3 F (37.4 C) Oral 113 16 93 % -  12/21/12 1259 115/81 mmHg - - 57 - - -      Intake/Output from previous day:   02/14 0701 - 02/15 0700 In: 213.8 [I.V.:213.8] Out: -    Intake/Output this shift:   02/15 0701 - 02/15 1900 In: 550 [I.V.:550] Out: 25    Intake/Output     02/14 0701 - 02/15 0700 02/15 0701 - 02/16 0700   I.V. (mL/kg) 213.8 (2.6) 550 (6.7)   Total Intake(mL/kg) 213.8 (2.6) 550 (6.7)   Blood  25   Total Output   25   Net +213.8 +525           LABORATORY DATA:  Recent Labs  12/21/12 0044 12/22/12 0600  WBC 10.6* 7.6  HGB 11.8* 10.5*  HCT 35.0* 32.1*  PLT 134* 123*    Recent Labs  12/21/12 0044 12/22/12 0600  NA 140 139  K 3.4* 3.6  CL 104 105  CO2 26 26  BUN 29* 25*  CREATININE 1.74* 1.39*  GLUCOSE 110* 96  CALCIUM 8.8 8.8   Lab Results  Component Value Date   INR 1.54* 11/27/2010     Recent Radiographic Studies :  Dg Chest 2 View  12/21/2012  *RADIOLOGY REPORT*  Clinical Data: Fall.  Hip pain.  Altered mental status.  Squamous cell carcinoma of the mouth.  CHEST - 2 VIEW  Comparison: Multiple exams, including 12/14/2012  Findings: Vague density in the right upper lobe noted, possibly partially reflecting some of the density shown on recent PET CT. Underlying pneumonia is not excluded, although the density is very indistinct.  There is mild blunting of the posterior costophrenic angles.  Hazy dependent opacity noted in the lungs on the lateral projection.  Heart size is within normal limits for technique.  IMPRESSION:  1.  Vague posterior opacities in the lungs, potentially from mild  atelectasis. 2.  Indistinct density in the right upper lobe, potentially reflecting the opacity shown on recent PET CT.  This should be followed to clearance.   Original Report Authenticated By: Gaylyn Rong, M.D.    Dg Hip Complete Left  12/21/2012  *RADIOLOGY REPORT*  Clinical Data: Fall.  Left hip pain.  LEFT HIP - COMPLETE 2+ VIEW  Comparison: 12/14/2012  Findings: Degenerative arthropathy of both hips noted, with associated loss of articular space and spurring along the acetabula.  Possible chondrocalcinosis of the acetabular labrum.  There is irregular calcification in the subcapital region of the left femoral head.  This could well represent spur, but nondisplaced subcapital fracture could have a similar appearance.  IMPRESSION:  1. Femoral head spur versus subtle subcapital left femoral neck fracture.  Consider MRI or CT for further characterization. 2.  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   Original Report Authenticated By: Gaylyn Rong, M.D.    Dg Hip Operative Left  12/22/2012  *RADIOLOGY REPORT*  Clinical Data: Femur fracture  OPERATIVE LEFT HIP  Comparison: Yesterday  Findings: Three screws are seen transfixing the femoral  neck fracture.  Anatomic alignment.  No breakage or loosening of the hardware.  IMPRESSION: ORIF left femoral neck fracture.   Original Report Authenticated By: Jolaine Click, M.D.    Ct Head Wo Contrast  12/21/2012  *RADIOLOGY REPORT*  Clinical Data: Altered mental status.  CT HEAD WITHOUT CONTRAST  Technique:  Contiguous axial images were obtained from the base of the skull through the vertex without contrast.  Comparison: Images from the PET / CT performed 12/14/2012  Findings: There is no evidence of acute infarction, mass lesion, or intra- or extra-axial hemorrhage on CT.  Prominence of the ventricles and sulci reflects mild cortical volume loss.  Scattered periventricular and subcortical white matter change may reflect small vessel ischemic microangiopathy.  The brainstem and fourth ventricle are within normal limits.  The basal ganglia are unremarkable in appearance.  The cerebral hemispheres demonstrate grossly normal gray-white differentiation. No mass effect or midline shift is seen.  There is no evidence of fracture; visualized osseous structures are unremarkable in appearance.  The visualized portions of the orbits are within normal limits.  There is opacification of the right maxillary sinus, and partial opacification of the right side of the sphenoid sinus and right ethmoid air cells.  The remaining paranasal sinuses and mastoid air cells are well-aerated.  No significant soft tissue abnormalities are seen.  IMPRESSION:  1.  No acute intracranial pathology seen on CT. 2.  Mild cortical volume loss and scattered small vessel ischemic microangiopathy. 3.  Opacification of the right maxillary sinus, and partial opacification of the right side of the sphenoid sinus and right ethmoid air cells.   Original Report Authenticated By: Tonia Ghent, M.D.    Dg Pelvis Portable  12/22/2012  *RADIOLOGY REPORT*  Clinical Data: Postop left hip pinning  PORTABLE PELVIS  Comparison: Yesterday  Findings: Three  cancellous screws transfix a femoral neck fracture. No breakage or loosening of the hardware.  Anatomic alignment.  IMPRESSION: ORIF left femoral neck fracture.   Original Report Authenticated By: Jolaine Click, M.D.    Ct Hip Left Wo Contrast  12/21/2012  *RADIOLOGY REPORT*  Clinical Data: Pain.  CT OF THE LEFT HIP WITHOUT CONTRAST  Technique:  Multidetector CT imaging was performed according to the standard protocol. Multiplanar CT image reconstructions were also generated.  Comparison: Plain films earlier this same date.  Findings: The patient has an acute subcapital left hip fracture. The fracture is mildly impacted with minimal anterior displacement. No other fracture is identified.  Left hip joint effusion in association with fracture is noted.  IMPRESSION: Acute subcapital fracture left hip.   Original Report Authenticated By: Holley Dexter, M.D.    Nm Pulmonary Perf And Vent  12/21/2012  *RADIOLOGY REPORT*  Clinical Data:  Shortness of breath.  NUCLEAR MEDICINE VENTILATION - PERFUSION LUNG SCAN  Technique:  Wash-in, equilibrium, and wash-out phase ventilation images were obtained using Xe-133 gas.  Perfusion images were obtained in multiple projections after intravenous injection of Tc- 52m MAA.  Radiopharmaceuticals:  20 mCi Xe-133 gas and 3 mCi Tc-42m MAA.  Comparison:  Chest plain imaging of 12/21/2012.  Findings:  Ventilation study:  On the breath-holding and equilibrium images there is expected distribution of xenon gas with no segmental or lobar ventilation defects.  There is diffuse delayed washout of xenon gas consistent with element of air trapping and obstructive pulmonary disease.  Perfusion study:  There is expected distribution of the MAA particles.  No segmental or lobar perfusion defects are seen.  No ventilation of these mismatch is evident.  IMPRESSION: No lobar or segmental ventilation or perfusion defects are seen.  Examination is low probability for pulmonary embolism.  Delayed  washout of xenon gas is seen consistent with element of obstructive pulmonary disease.   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  NUCLEAR MEDICINE PET SKULL BASE TO THIGH  Fasting Blood Glucose:  99  Technique:  18.1 mCi F-18 FDG was injected intravenously. CT data was obtained and used for attenuation correction and anatomic localization only.  (This was not acquired as a diagnostic CT examination.) Additional exam technical data entered on technologist worksheet.  Comparison:  Neck CT 10/12/2012.  Findings:  Neck: There is intensely hypermetabolic activity within the right lateral oropharynx corresponding with the asymmetric soft tissue fullness and enhancement on prior CT.  This has an SUV max of 20.7. No other abnormal activity is seen associated with the pharyngeal mucosal space.  There are small level II lymph nodes on the right which are slightly hypermetabolic (SUV max 4.4).  These are not pathologically enlarged.  There are no other hypermetabolic cervical lymph nodes.  Chest:  There is a hypermetabolic right apical nodule which measures 2.3 x 1.4 cm on image 67.  This has an SUV max of 8.0.  No nodule was present in this area on the CT performed 2 months ago. There is additional hypermetabolic patchy airspace disease dependently in the right lower lobe.  This is less discrete and has an SUV max of 7.5.  There is no abnormal metabolic activity within the left lung.  There are no hypermetabolic mediastinal or hilar lymph nodes.  Calcified right hilar lymph nodes and coronary artery calcifications are noted.  Abdomen/Pelvis:  No abnormal hypermetabolic activity within the liver, pancreas, adrenal glands, or spleen.  No hypermetabolic lymph nodes in the abdomen or pelvis.  Skeleton:  No focal hypermetabolic activity to  suggest skeletal metastasis. There is mild asymmetric degenerative activity associated with the right acromioclavicular joint.  IMPRESSION:  1.  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  There are small asymmetric level II lymph nodes on the right which are mildly hypermetabolic and could reflect metastases. 2.  No other lesions of the pharyngeal mucosal space identified. 3.  Right lung apical nodule and lower lobe air space disease with associated hypermetabolic activity.  Both lung apices were included on the neck CT performed 2 months ago and appeared normal at that time.  Therefore, these findings are likely all inflammatory (pneumonia). Short-term radiographic followup is recommended to exclude a developing apical tumor.   Original Report Authenticated By: Carey Bullocks, M.D.      Examination:    Wound Exam: clean, dry, intact   Drainage:  None: wound tissue dry  Unable to assess neuro status-good pulses in feet      Assessment:    Day of Surgery  Procedure(s) (LRB): CANNULATED HIP PINNING (Left)  ADDITIONAL DIAGNOSIS:  Principal Problem:   Fever Active Problems:   Hypertension   Chronic kidney disease   CAD (coronary artery disease)   Hypoxia   Hip fracture, left   Confusion state     Plan: Physical Therapy as ordered Weight Bearing as Tolerated (WBAT)  DVT Prophylaxis:  Lovenox  OOB with PT when medically stable           Vivi Piccirilli W 12/22/2012 11:18 AM

## 2012-12-22 NOTE — Brief Op Note (Signed)
12/21/2012 - 12/22/2012  8:41 AM  PATIENT:  [name]  72 y.o. male  PRE-OPERATIVE DIAGNOSIS:  fracture  POST-OPERATIVE DIAGNOSIS:  fracture left hip  PROCEDURE:  Procedure(s): CANNULATED HIP PINNING (Left)  SURGEON:  Surgeon(s) and Role:    * Kathryne Hitch, MD - Primary  PHYSICIAN ASSISTANT:   ASSISTANTS: none   ANESTHESIA:   general  EBL:  Total I/O In: 50 [I.V.:50] Out: -   BLOOD ADMINISTERED:none  DRAINS: none   LOCAL MEDICATIONS USED:  NONE  SPECIMEN:  No Specimen  DISPOSITION OF SPECIMEN:  N/A  COUNTS:  YES  TOURNIQUET:  * No tourniquets in log *  DICTATION: .Other Dictation: Dictation Number [id number]  PLAN OF CARE: Admit to inpatient   PATIENT DISPOSITION:  PACU - hemodynamically stable.   Delay start of Pharmacological VTE agent (>24hrs) due to surgical blood loss or risk of bleeding: no

## 2012-12-22 NOTE — Progress Notes (Signed)
Patient refused CPAP and does not want a CPAP set up for him.  Patient stated that he had cancer in his mouth and could not deal with both the cancer and CPAP at the same time.  Patient stated that once he found out he had cancer he has stopped using CPAP at home.

## 2012-12-22 NOTE — Anesthesia Procedure Notes (Signed)
Procedure Name: Intubation Date/Time: 12/22/2012 7:42 AM Performed by: Tyrone Nine Pre-anesthesia Checklist: Patient identified, Timeout performed, Emergency Drugs available, Suction available and Patient being monitored Patient Re-evaluated:Patient Re-evaluated prior to inductionOxygen Delivery Method: Circle system utilized Preoxygenation: Pre-oxygenation with 100% oxygen Intubation Type: IV induction Ventilation: Mask ventilation without difficulty Laryngoscope Size: Mac and 3 Grade View: Grade I Tube type: Oral Tube size: 7.5 mm Number of attempts: 1 Airway Equipment and Method: Stylet Placement Confirmation: ETT inserted through vocal cords under direct vision,  breath sounds checked- equal and bilateral and positive ETCO2 Secured at: 22 cm Tube secured with: Tape Dental Injury: Teeth and Oropharynx as per pre-operative assessment

## 2012-12-22 NOTE — Op Note (Signed)
Gary [name], Gary [name] NO.:  [id number]  MEDICAL RECORD NO.:  [id number]  LOCATION:  3W02C                        FACILITY:  [facility]  PHYSICIAN:  Vanita Panda. Magnus Ivan, M.D.DATE OF BIRTH:  02/02/1941  DATE OF PROCEDURE:  12/22/2012 DATE OF DISCHARGE:                              OPERATIVE REPORT   PREOPERATIVE DIAGNOSIS:  Nondisplaced left hip femoral neck fracture.  POSTOPERATIVE DIAGNOSIS:  Nondisplaced left hip femoral neck fracture.  PROCEDURE:  Open reduction and cannulated screw fixation of left hip femoral neck fracture.  IMPLANTS:  Biomet 6.5 mm cannulated screws x3.  SURGEON:  Doneen Poisson, MD  ANESTHESIA:  General.  ANTIBIOTICS:  1 g IV Ancef.  BLOOD LOSS:  Less than 50 mL.  COMPLICATIONS:  None.  INDICATIONS:  [name] is a 72 year old gentleman who on Wednesday was walking in the snow when he slipped, fell injuring his left hip.  After a day of agonising over this and inability to ambulate, he showed up to the emergency room early Friday morning and was found to have a nondisplaced femoral neck fracture.  This was confirmed on plain films and a CT scan.  He was admitted to the hospitalist service and we will set him up for surgery for this morning.  I have explained to him in detail the risks and benefits of surgery and why we are proceeding with this and he does wish to proceed.  PROCEDURE DESCRIPTION:  After informed consent was obtained, appropriate left hip was marked.  He was brought to the operating room and placed supine on the fracture table.  General anesthesia was then obtained. His left operative leg was placed in an inline skeletal traction, but no traction applied due to the nondisplaced nature of the fracture. Perineal post was placed and his right hip was flexed and abducted in a stirrup out of the field with appropriate padding in the popliteal area. We then assessed his left under direct fluoroscopy and found he  had a definite nondisplaced fracture.  I then prepped the left hip with DuraPrep and sterile drapes.  Time-out was called to identify correct patient, correct left hip.  I then made a small incision near the level of lesser trochanter and dissected down to the cortex under direct fluoroscopy.  I placed 3 guide pins in an inverted triangle format. Over these guide pins I took measurements and then placed 3 cannulated screws, one the most inferior being 105 mm and then the two anterior- superior and posterior-superior were 95 mm.  Once I placed all these screws again under direct fluoroscopy, we removed all pins under direct fluoro.  I put the hip through range of motion and none of the screws penetrated the femoral head cortex.  I then irrigated the soft tissue with normal saline solution and closed the deep tissue with 0 Vicryl followed by 2-0 Vicryl in subcutaneous tissue and staples on the skin. Well-padded sterile dressing was applied.  He was awakened, extubated and taken to recovery room in stable condition.  All final counts were correct and there were no complications noted.  Postoperatively, we are just going to have him touchdown, weight bear with physical therapy and  hopefully get him out of the hospital in the next day or two.     Vanita Panda. Magnus Ivan, M.D.     CYB/MEDQ  D:  12/22/2012  T:  12/22/2012  Job:  [id number]

## 2012-12-22 NOTE — Progress Notes (Signed)
[facility] PROGRESS NOTE  Gary [name] AVW:[id number] DOB: [birth year] DOA: 12/21/2012 PCP: [name], MD  Assessment/Plan: 1-acute toxic encephalopathy: secondary to PNA and fever.  -continue levaquin -PRN oxygen -PRN albuterol  2-hip fracture:per ortho -s/p hip surgery -will follow rec's  3-hypothyroidism: continue synthroid  4-GERD:continue PPI  5-RA: continue chronic prednisone  6-OSA: continue CPAP qhs  7-glaucoma: continue eye drops.  DVT: lovenox  Code Status: Full Family Communication: no family at bedside. Disposition Plan:per PT rec's; either home with HHPT vs SNF.   Consultants:  Ortho  PT  Procedures:  Hip surgery  Antibiotics:  levaquin  HPI/Subjective: Afebrile, no CP or SOB; S/P open reduction and cannulated screw fixation of left hip femoral neck fracture.  Objective: Filed Vitals:   12/22/12 0900 12/22/12 0915 12/22/12 0930 12/22/12 0945  BP: 154/67 142/67 139/65 149/71  Pulse:    53  Temp:    97.4 F (36.3 C)  TempSrc:      Resp:    13  Height:      Weight:      SpO2:    96%    Intake/Output Summary (Last 24 hours) at 12/22/12 1648 Last data filed at 12/22/12 0854  Gross per 24 hour  Intake 763.75 ml  Output     25 ml  Net 738.75 ml   Filed Weights   12/21/12 0300 12/22/12 0533  Weight: 83.915 kg (185 lb) 82.1 kg (181 lb)    Exam:   General:  Slight confusion due to pain meds; no fever; denies CP  Cardiovascular: S1 and S2; no rubs and gallops  Respiratory: good air movement, no wheezing  Abdomen: soft, NT, ND  Neuro: non focal deficit.  Data Reviewed: Basic Metabolic Panel:  Recent Labs Lab 12/21/12 0044 12/22/12 0600  NA 140 139  K 3.4* 3.6  CL 104 105  CO2 26 26  GLUCOSE 110* 96  BUN 29* 25*  CREATININE 1.74* 1.39*  CALCIUM 8.8 8.8   Liver Function Tests:  Recent Labs Lab 12/21/12 0044  AST 22  ALT 15  ALKPHOS 75  BILITOT 0.9  PROT 6.2  ALBUMIN 2.6*    CBC:  Recent Labs Lab 12/21/12 0044 12/22/12 0600  WBC 10.6* 7.6  NEUTROABS  --  5.7  HGB 11.8* 10.5*  HCT 35.0* 32.1*  MCV 94.6 95.0  PLT 134* 123*   Cardiac Enzymes:  Recent Labs Lab 12/21/12 2055 12/22/12 0043 12/22/12 0600  TROPONINI <0.30 <0.30 <0.30   BNP (last 3 results) No results found for this basename: PROBNP,  in the last 8760 hours CBG: No results found for this basename: GLUCAP,  in the last 168 hours  Recent Results (from the past 240 hour(s))  URINE CULTURE     Status: None   Collection Time    12/21/12 12:52 AM      Result Value Range Status   Specimen Description URINE, CATHETERIZED   Final   Special Requests NONE   Final   Culture  Setup Time 12/21/2012 02:06   Final   Colony Count NO GROWTH   Final   Culture NO GROWTH   Final   Report Status 12/22/2012 FINAL   Final  CULTURE, BLOOD (ROUTINE X 2)     Status: None   Collection Time    12/21/12 12:55 AM      Result Value Range Status   Specimen Description BLOOD RIGHT ARM   Final   Special Requests BOTTLES DRAWN AEROBIC AND ANAEROBIC 10CC EACH  Final   Culture  Setup Time 12/21/2012 13:20   Final   Culture     Final   Value:        BLOOD CULTURE RECEIVED NO GROWTH TO DATE CULTURE WILL BE HELD FOR 5 DAYS BEFORE ISSUING A FINAL NEGATIVE REPORT   Report Status PENDING   Incomplete  CULTURE, BLOOD (ROUTINE X 2)     Status: None   Collection Time    12/21/12  1:05 AM      Result Value Range Status   Specimen Description BLOOD RIGHT HAND   Final   Special Requests BOTTLES DRAWN AEROBIC AND ANAEROBIC 5CC EACH   Final   Culture  Setup Time 12/21/2012 13:20   Final   Culture     Final   Value:        BLOOD CULTURE RECEIVED NO GROWTH TO DATE CULTURE WILL BE HELD FOR 5 DAYS BEFORE ISSUING A FINAL NEGATIVE REPORT   Report Status PENDING   Incomplete  SURGICAL PCR SCREEN     Status: Abnormal   Collection Time    12/22/12  3:24 AM      Result Value Range Status   MRSA, PCR NEGATIVE  NEGATIVE Final    Staphylococcus aureus POSITIVE (*) NEGATIVE Final   Comment:            The Xpert SA Assay (FDA     approved for NASAL specimens     in patients over 67 years of age),     is one component of     a comprehensive surveillance     program.  Test performance has     been validated by [employer] for patients greater     than or equal to 89 year old.     It is not intended     to diagnose infection nor to     guide or monitor treatment.     Studies: Dg Chest 2 View  12/21/2012  *RADIOLOGY REPORT*  Clinical Data: Fall.  Hip pain.  Altered mental status.  Squamous cell carcinoma of the mouth.  CHEST - 2 VIEW  Comparison: Multiple exams, including 12/14/2012  Findings: Vague density in the right upper lobe noted, possibly partially reflecting some of the density shown on recent PET CT. Underlying pneumonia is not excluded, although the density is very indistinct.  There is mild blunting of the posterior costophrenic angles.  Hazy dependent opacity noted in the lungs on the lateral projection.  Heart size is within normal limits for technique.  IMPRESSION:  1.  Vague posterior opacities in the lungs, potentially from mild atelectasis. 2.  Indistinct density in the right upper lobe, potentially reflecting the opacity shown on recent PET CT.  This should be followed to clearance.   Original Report Authenticated By: Gaylyn Rong, M.D.    Dg Hip Complete Left  12/21/2012  *RADIOLOGY REPORT*  Clinical Data: Fall.  Left hip pain.  LEFT HIP - COMPLETE 2+ VIEW  Comparison: 12/14/2012  Findings: Degenerative arthropathy of both hips noted, with associated loss of articular space and spurring along the acetabula.  Possible chondrocalcinosis of the acetabular labrum.  There is irregular calcification in the subcapital region of the left femoral head.  This could well represent spur, but nondisplaced subcapital fracture could have a similar appearance.  IMPRESSION:  1. Femoral head spur versus subtle  subcapital left femoral neck fracture.  Consider MRI or CT for further characterization. 2.  Degenerative arthropathy  in both hips, with loss of articular space and acetabular spurring.   Original Report Authenticated By: Gaylyn Rong, M.D.    Dg Hip Operative Left  12/22/2012  *RADIOLOGY REPORT*  Clinical Data: Femur fracture  OPERATIVE LEFT HIP  Comparison: Yesterday  Findings: Three screws are seen transfixing the femoral neck fracture.  Anatomic alignment.  No breakage or loosening of the hardware.  IMPRESSION: ORIF left femoral neck fracture.   Original Report Authenticated By: Jolaine Click, M.D.    Ct Head Wo Contrast  12/21/2012  *RADIOLOGY REPORT*  Clinical Data: Altered mental status.  CT HEAD WITHOUT CONTRAST  Technique:  Contiguous axial images were obtained from the base of the skull through the vertex without contrast.  Comparison: Images from the PET / CT performed 12/14/2012  Findings: There is no evidence of acute infarction, mass lesion, or intra- or extra-axial hemorrhage on CT.  Prominence of the ventricles and sulci reflects mild cortical volume loss.  Scattered periventricular and subcortical white matter change may reflect small vessel ischemic microangiopathy.  The brainstem and fourth ventricle are within normal limits.  The basal ganglia are unremarkable in appearance.  The cerebral hemispheres demonstrate grossly normal gray-white differentiation. No mass effect or midline shift is seen.  There is no evidence of fracture; visualized osseous structures are unremarkable in appearance.  The visualized portions of the orbits are within normal limits.  There is opacification of the right maxillary sinus, and partial opacification of the right side of the sphenoid sinus and right ethmoid air cells.  The remaining paranasal sinuses and mastoid air cells are well-aerated.  No significant soft tissue abnormalities are seen.  IMPRESSION:  1.  No acute intracranial pathology seen on CT. 2.   Mild cortical volume loss and scattered small vessel ischemic microangiopathy. 3.  Opacification of the right maxillary sinus, and partial opacification of the right side of the sphenoid sinus and right ethmoid air cells.   Original Report Authenticated By: Tonia Ghent, M.D.    Dg Pelvis Portable  12/22/2012  *RADIOLOGY REPORT*  Clinical Data: Postop left hip pinning  PORTABLE PELVIS  Comparison: Yesterday  Findings: Three cancellous screws transfix a femoral neck fracture. No breakage or loosening of the hardware.  Anatomic alignment.  IMPRESSION: ORIF left femoral neck fracture.   Original Report Authenticated By: Jolaine Click, M.D.    Ct Hip Left Wo Contrast  12/21/2012  *RADIOLOGY REPORT*  Clinical Data: Pain.  CT OF THE LEFT HIP WITHOUT CONTRAST  Technique:  Multidetector CT imaging was performed according to the standard protocol. Multiplanar CT image reconstructions were also generated.  Comparison: Plain films earlier this same date.  Findings: The patient has an acute subcapital left hip fracture. The fracture is mildly impacted with minimal anterior displacement. No other fracture is identified.  Left hip joint effusion in association with fracture is noted.  IMPRESSION: Acute subcapital fracture left hip.   Original Report Authenticated By: Holley Dexter, M.D.    Nm Pulmonary Perf And Vent  12/21/2012  *RADIOLOGY REPORT*  Clinical Data:  Shortness of breath.  NUCLEAR MEDICINE VENTILATION - PERFUSION LUNG SCAN  Technique:  Wash-in, equilibrium, and wash-out phase ventilation images were obtained using Xe-133 gas.  Perfusion images were obtained in multiple projections after intravenous injection of Tc- 62m MAA.  Radiopharmaceuticals:  20 mCi Xe-133 gas and 3 mCi Tc-34m MAA.  Comparison:  Chest plain imaging of 12/21/2012.  Findings:  Ventilation study:  On the breath-holding and equilibrium images there is expected distribution of  xenon gas with no segmental or lobar ventilation defects.   There is diffuse delayed washout of xenon gas consistent with element of air trapping and obstructive pulmonary disease.  Perfusion study:  There is expected distribution of the MAA particles.  No segmental or lobar perfusion defects are seen.  No ventilation of these mismatch is evident.  IMPRESSION: No lobar or segmental ventilation or perfusion defects are seen.  Examination is low probability for pulmonary embolism.  Delayed washout of xenon gas is seen consistent with element of obstructive pulmonary disease.   Original Report Authenticated By: Onalee Hua Call     Scheduled Meds: . acyclovir  400 mg Oral BID  . bisoprolol  5 mg Oral Daily  . dorzolamide-timolol  1 drop Both Eyes BID  . enoxaparin (LOVENOX) injection  40 mg Subcutaneous Q24H  . levofloxacin (LEVAQUIN) IV  750 mg Intravenous Q24H  . levothyroxine  200 mcg Oral Q breakfast  . pantoprazole  40 mg Oral Daily  . polyethylene glycol  17 g Oral Daily  . predniSONE  5 mg Oral Daily   Continuous Infusions:   Principal Problem:   Fever Active Problems:   Hypertension   Chronic kidney disease   CAD (coronary artery disease)   Hypoxia   Hip fracture, left   Confusion state    Time spent: >30 minutes    Mikias Lanz  [facility] Pager [phone]. If 8PM-8AM, please contact night-coverage at [email or web address], password [facility] 12/22/2012, 4:48 PM  LOS: 1 day

## 2012-12-22 NOTE — Preoperative (Signed)
Beta Blockers   Reason not to administer Beta Blockers:Not Applicable 

## 2012-12-23 DIAGNOSIS — G4730 Sleep apnea, unspecified: Secondary | ICD-10-CM

## 2012-12-23 DIAGNOSIS — E079 Disorder of thyroid, unspecified: Secondary | ICD-10-CM

## 2012-12-23 DIAGNOSIS — M129 Arthropathy, unspecified: Secondary | ICD-10-CM

## 2012-12-23 LAB — CBC
HCT: 31.3 % — ABNORMAL LOW (ref 39.0–52.0)
MCHC: 33.5 g/dL (ref 30.0–36.0)
Platelets: 139 10*3/uL — ABNORMAL LOW (ref 150–400)
RDW: 13.3 % (ref 11.5–15.5)
WBC: 8.1 10*3/uL (ref 4.0–10.5)

## 2012-12-23 LAB — BASIC METABOLIC PANEL
Chloride: 102 mEq/L (ref 96–112)
GFR calc Af Amer: 63 mL/min — ABNORMAL LOW (ref >90)
GFR calc non Af Amer: 55 mL/min — ABNORMAL LOW (ref >90)
Potassium: 3.8 mEq/L (ref 3.5–5.1)
Sodium: 137 mEq/L (ref 135–145)

## 2012-12-23 MED ORDER — ENSURE COMPLETE PO LIQD
237.0000 mL | Freq: Three times a day (TID) | ORAL | Status: DC
  Administered 2012-12-24: 237 mL via ORAL

## 2012-12-23 MED ORDER — ASPIRIN EC 325 MG PO TBEC: 325.0000 mg | DELAYED_RELEASE_TABLET | Freq: Every day | ORAL | Status: DC

## 2012-12-23 MED ORDER — OXYCODONE-ACETAMINOPHEN 5-325 MG PO TABS: 1.0000 | ORAL_TABLET | ORAL | Status: DC | PRN

## 2012-12-23 NOTE — Progress Notes (Signed)
[facility] PROGRESS NOTE  Gary [name] UJW:[id number] DOB: 1941/02/25 DOA: 12/21/2012 PCP: [name], MD  Assessment/Plan: 1-acute toxic encephalopathy: secondary to PNA and fever.  -afebrile -O2 sat on RA at goal -Continue levaquin -PRN oxygen -PRN albuterol  2-hip fracture: per ortho -s/p hip surgery -PT/OT as indicated by ortho -continue lovenox -still with foley in place -will follow rec's  3-hypothyroidism: continue synthroid  4-GERD:continue PPI  5-RA: continue chronic prednisone  6-OSA: continue CPAP qhs (patient reprots he will use it)  7-glaucoma: continue eye drops.  8-Hypokalemia: repleted  9-Hypoalbuminemia: will start ensure TID  DVT: on lovenox  Code Status: Full Family Communication: no family at bedside. Disposition Plan:per PT rec's; either home with HHPT vs SNF.   Consultants:  Ortho  PT  Procedures:  Hip surgery  Antibiotics:  levaquin  HPI/Subjective: Afebrile, no CP or SOB; Day 1 S/P open reduction and cannulated screw fixation of left hip femoral neck fracture. AAOX2.  Objective: Filed Vitals:   12/22/12 2000 12/22/12 2100 12/23/12 0400 12/23/12 0500  BP:  154/79  154/88  Pulse:  59  68  Temp:  97.9 F (36.6 C)  98.6 F (37 C)  TempSrc:      Resp: 16 21 16 20   Height:      Weight:      SpO2: 96% 98% 97% 98%    Intake/Output Summary (Last 24 hours) at 12/23/12 0836 Last data filed at 12/23/12 0500  Gross per 24 hour  Intake   1460 ml  Output    775 ml  Net    685 ml   Filed Weights   12/21/12 0300 12/22/12 0533  Weight: 83.915 kg (185 lb) 82.1 kg (181 lb)    Exam:   General:  Slight confusion due to pain meds; no fever; denies CP  Cardiovascular: S1 and S2; no rubs and gallops  Respiratory: good air movement, no wheezing  Abdomen: soft, NT, ND; positive BS  Neuro: non focal deficit.  Data Reviewed: Basic Metabolic Panel:  Recent Labs Lab 12/21/12 0044 12/22/12 0600  NA 140  139  K 3.4* 3.6  CL 104 105  CO2 26 26  GLUCOSE 110* 96  BUN 29* 25*  CREATININE 1.74* 1.39*  CALCIUM 8.8 8.8   Liver Function Tests:  Recent Labs Lab 12/21/12 0044  AST 22  ALT 15  ALKPHOS 75  BILITOT 0.9  PROT 6.2  ALBUMIN 2.6*   CBC:  Recent Labs Lab 12/21/12 0044 12/22/12 0600 12/23/12 0635  WBC 10.6* 7.6 8.1  NEUTROABS  --  5.7  --   HGB 11.8* 10.5* 10.5*  HCT 35.0* 32.1* 31.3*  MCV 94.6 95.0 93.4  PLT 134* 123* 139*   Cardiac Enzymes:  Recent Labs Lab 12/21/12 2055 12/22/12 0043 12/22/12 0600  TROPONINI <0.30 <0.30 <0.30   BNP (last 3 results) No results found for this basename: PROBNP,  in the last 8760 hours CBG: No results found for this basename: GLUCAP,  in the last 168 hours  Recent Results (from the past 240 hour(s))  URINE CULTURE     Status: None   Collection Time    12/21/12 12:52 AM      Result Value Range Status   Specimen Description URINE, CATHETERIZED   Final   Special Requests NONE   Final   Culture  Setup Time 12/21/2012 02:06   Final   Colony Count NO GROWTH   Final   Culture NO GROWTH   Final   Report Status  12/22/2012 FINAL   Final  CULTURE, BLOOD (ROUTINE X 2)     Status: None   Collection Time    12/21/12 12:55 AM      Result Value Range Status   Specimen Description BLOOD RIGHT ARM   Final   Special Requests BOTTLES DRAWN AEROBIC AND ANAEROBIC 10CC EACH   Final   Culture  Setup Time 12/21/2012 13:20   Final   Culture     Final   Value:        BLOOD CULTURE RECEIVED NO GROWTH TO DATE CULTURE WILL BE HELD FOR 5 DAYS BEFORE ISSUING A FINAL NEGATIVE REPORT   Report Status PENDING   Incomplete  CULTURE, BLOOD (ROUTINE X 2)     Status: None   Collection Time    12/21/12  1:05 AM      Result Value Range Status   Specimen Description BLOOD RIGHT HAND   Final   Special Requests BOTTLES DRAWN AEROBIC AND ANAEROBIC 5CC EACH   Final   Culture  Setup Time 12/21/2012 13:20   Final   Culture     Final   Value:        BLOOD  CULTURE RECEIVED NO GROWTH TO DATE CULTURE WILL BE HELD FOR 5 DAYS BEFORE ISSUING A FINAL NEGATIVE REPORT   Report Status PENDING   Incomplete  SURGICAL PCR SCREEN     Status: Abnormal   Collection Time    12/22/12  3:24 AM      Result Value Range Status   MRSA, PCR NEGATIVE  NEGATIVE Final   Staphylococcus aureus POSITIVE (*) NEGATIVE Final   Comment:            The Xpert SA Assay (FDA     approved for NASAL specimens     in patients over 66 years of age),     is one component of     a comprehensive surveillance     program.  Test performance has     been validated by [employer] for patients greater     than or equal to 33 year old.     It is not intended     to diagnose infection nor to     guide or monitor treatment.     Studies: Dg Hip Operative Left  12/22/2012  *RADIOLOGY REPORT*  Clinical Data: Femur fracture  OPERATIVE LEFT HIP  Comparison: Yesterday  Findings: Three screws are seen transfixing the femoral neck fracture.  Anatomic alignment.  No breakage or loosening of the hardware.  IMPRESSION: ORIF left femoral neck fracture.   Original Report Authenticated By: Jolaine Click, M.D.    Dg Pelvis Portable  12/22/2012  *RADIOLOGY REPORT*  Clinical Data: Postop left hip pinning  PORTABLE PELVIS  Comparison: Yesterday  Findings: Three cancellous screws transfix a femoral neck fracture. No breakage or loosening of the hardware.  Anatomic alignment.  IMPRESSION: ORIF left femoral neck fracture.   Original Report Authenticated By: Jolaine Click, M.D.    Nm Pulmonary Perf And Vent  12/21/2012  *RADIOLOGY REPORT*  Clinical Data:  Shortness of breath.  NUCLEAR MEDICINE VENTILATION - PERFUSION LUNG SCAN  Technique:  Wash-in, equilibrium, and wash-out phase ventilation images were obtained using Xe-133 gas.  Perfusion images were obtained in multiple projections after intravenous injection of Tc- 39m MAA.  Radiopharmaceuticals:  20 mCi Xe-133 gas and 3 mCi Tc-52m MAA.  Comparison:  Chest  plain imaging of 12/21/2012.  Findings:  Ventilation study:  On the breath-holding and equilibrium images there is expected distribution of xenon gas with no segmental or lobar ventilation defects.  There is diffuse delayed washout of xenon gas consistent with element of air trapping and obstructive pulmonary disease.  Perfusion study:  There is expected distribution of the MAA particles.  No segmental or lobar perfusion defects are seen.  No ventilation of these mismatch is evident.  IMPRESSION: No lobar or segmental ventilation or perfusion defects are seen.  Examination is low probability for pulmonary embolism.  Delayed washout of xenon gas is seen consistent with element of obstructive pulmonary disease.   Original Report Authenticated By: Onalee Hua Call     Scheduled Meds: . acyclovir  400 mg Oral BID  . aspirin EC  325 mg Oral Q breakfast  . bisoprolol  5 mg Oral Daily  . docusate sodium  100 mg Oral BID  . dorzolamide-timolol  1 drop Both Eyes BID  . enoxaparin (LOVENOX) injection  40 mg Subcutaneous Q24H  . ferrous sulfate  325 mg Oral TID PC  . levofloxacin (LEVAQUIN) IV  750 mg Intravenous Q24H  . levothyroxine  200 mcg Oral Q breakfast  . pantoprazole  40 mg Oral Daily  . polyethylene glycol  17 g Oral Daily  . predniSONE  5 mg Oral Daily   Continuous Infusions: . sodium chloride 50 mL/hr at 12/22/12 2019    Principal Problem:   Fever Active Problems:   Hypertension   Chronic kidney disease   CAD (coronary artery disease)   Hypoxia   Hip fracture, left   Confusion state    Time spent: >30 minutes    Katrenia Alkins  [facility] Pager [phone]. If 8PM-8AM, please contact night-coverage at [email or web address], password [facility] 12/23/2012, 8:36 AM  LOS: 2 days

## 2012-12-23 NOTE — Progress Notes (Signed)
PT refused her cpap 

## 2012-12-23 NOTE — Evaluation (Signed)
Physical Therapy Evaluation Patient Details Name: Gary [name] MRN: [id number] DOB: 05/03/1941 Today's Date: 12/23/2012 Time: 1128-1202 PT Time Calculation (min): 34 min  PT Assessment / Plan / Recommendation Clinical Impression  Patient is a 72 yo male s/p ORIF of Lt. hip fracture.  Patient required +2 assist for mobility today.  Wife unable to physically assist patient.  Recommend SNF for continued therapy at discharge.  Will benefit from acute PT to maximize independence prior to discharge.    PT Assessment  Patient needs continued PT services    Follow Up Recommendations  SNF    Does the patient have the potential to tolerate intense rehabilitation      Barriers to Discharge Decreased caregiver support      Equipment Recommendations  Rolling walker with 5" wheels    Recommendations for Other Services     Frequency Min 6X/week    Precautions / Restrictions Precautions Precautions: Fall Restrictions Weight Bearing Restrictions: Yes LLE Weight Bearing: Partial weight bearing LLE Partial Weight Bearing Percentage or Pounds: Up to 25%   Pertinent Vitals/Pain Pain limiting mobility      Mobility  Bed Mobility Bed Mobility: Supine to Sit;Sitting - Scoot to Edge of Bed Supine to Sit: 1: +2 Total assist;With rails Supine to Sit: Patient Percentage: 50% Sitting - Scoot to Edge of Bed: 3: Mod assist Details for Bed Mobility Assistance: Verbal cues for technique.  Assist to move LLE off bed and raise trunk. Transfers Transfers: Sit to Stand;Stand to Sit;Stand Pivot Transfers Sit to Stand: 1: +2 Total assist;From elevated surface;With upper extremity assist;From bed Sit to Stand: Patient Percentage: 50% Stand to Sit: 1: +2 Total assist;With upper extremity assist;With armrests;To chair/3-in-1 Stand to Sit: Patient Percentage: 50% Stand Pivot Transfers: 1: +2 Total assist Stand Pivot Transfers: Patient Percentage: 50% Details for Transfer Assistance: Verbal cues for hand  placement.  Raised bed to elevate surface to stand.  Attempted standing x2 - unable to come to complete standing position.  Successful on 3rd attempt.  Patient able to maintain PWB of 25% or less on LLE.  Patient able to take several steps to pivot to chair.  Assist to control descent into chair. Ambulation/Gait Ambulation/Gait Assistance: Not tested (comment)    Exercises     PT Diagnosis: Difficulty walking;Generalized weakness;Acute pain  PT Problem List: Decreased strength;Decreased range of motion;Decreased activity tolerance;Decreased mobility;Decreased knowledge of use of DME;Decreased knowledge of precautions;Pain PT Treatment Interventions: DME instruction;Gait training;Functional mobility training;Therapeutic exercise;Patient/family education   PT Goals Acute Rehab PT Goals PT Goal Formulation: With patient Time For Goal Achievement: 01/06/13 Potential to Achieve Goals: Good Pt will go Supine/Side to Sit: with modified independence;with rail PT Goal: Supine/Side to Sit - Progress: Goal set today Pt will go Sit to Supine/Side: with modified independence;with rail PT Goal: Sit to Supine/Side - Progress: Goal set today Pt will go Sit to Stand: with supervision;with upper extremity assist PT Goal: Sit to Stand - Progress: Goal set today Pt will go Stand to Sit: with supervision;with upper extremity assist PT Goal: Stand to Sit - Progress: Goal set today Pt will Ambulate: 51 - 150 feet;with supervision;with rolling walker PT Goal: Ambulate - Progress: Goal set today  Visit Information  Last PT Received On: 12/23/12 Assistance Needed: +2    Subjective Data  Subjective: "My wife is just getting over cancer.  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 Communication Communication: No difficulties    Copywriter, advertising Overall Cognitive Status: Appears within functional limits for tasks assessed/performed Arousal/Alertness: Awake/alert Orientation Level: Appears intact for tasks assessed Behavior During Session: [facility] for tasks performed    Extremity/Trunk Assessment Right Upper Extremity Assessment RUE ROM/Strength/Tone: [facility] for tasks assessed Left Upper Extremity Assessment LUE ROM/Strength/Tone: [facility] for tasks assessed Right Lower Extremity Assessment RLE ROM/Strength/Tone: WFL for tasks assessed RLE Sensation: WFL - Light Touch Left Lower Extremity Assessment LLE ROM/Strength/Tone: Deficits;Unable to fully assess;Due to pain LLE ROM/Strength/Tone Deficits: Able to assist moving LLE off of bed.  Unable to move against gravity   Balance    End of Session PT - End of Session Equipment Utilized During Treatment: Gait belt Activity Tolerance: Patient limited by pain;Patient limited by fatigue Patient left: in chair;with call bell/phone within reach;with nursing in room Nurse Communication: Mobility status;Weight bearing status  GP     Vena Austria 12/23/2012, 2:08 PM Durenda Hurt. Renaldo Fiddler, [facility] Acute Rehab Services Pager [phone]

## 2012-12-23 NOTE — Progress Notes (Signed)
Subjective: 1 Day Post-Op Procedure(s) (LRB): CANNULATED HIP PINNING (Left) Patient reports pain as moderate.  Tolerates motion of hip better.  Less confused this am.  Acute on chronic blood loss anemia (asymptomatic).  Objective: Vital signs in last 24 hours: Temp:  [97.4 F (36.3 C)-98.6 F (37 C)] 98.6 F (37 C) (02/16 0500) Pulse Rate:  [53-68] 68 (02/16 0500) Resp:  [13-21] 20 (02/16 0500) BP: (149-154)/(71-88) 154/88 mmHg (02/16 0500) SpO2:  [96 %-98 %] 98 % (02/16 0500)  Intake/Output from previous day: 02/15 0701 - 02/16 0700 In: 1510 [P.O.:960; I.V.:550] Out: 775 [Urine:750; Blood:25] Intake/Output this shift:     Recent Labs  12/21/12 0044 12/22/12 0600 12/23/12 0635  HGB 11.8* 10.5* 10.5*    Recent Labs  12/22/12 0600 12/23/12 0635  WBC 7.6 8.1  RBC 3.38* 3.35*  HCT 32.1* 31.3*  PLT 123* 139*    Recent Labs  12/22/12 0600 12/23/12 0635  NA 139 137  K 3.6 3.8  CL 105 102  CO2 26 26  BUN 25* 18  CREATININE 1.39* 1.28  GLUCOSE 96 97  CALCIUM 8.8 8.8   No results found for this basename: LABPT, INR,  in the last 72 hours  Intact pulses distally Dorsiflexion/Plantar flexion intact Incision: no drainage  Assessment/Plan: 1 Day Post-Op Procedure(s) (LRB): CANNULATED HIP PINNING (Left) Up with therapy; only touch don to 25% weight for now on left hip Lovenox while in hospital, but only aspirin for DVT upon discharge.  Kathryne Hitch 12/23/2012, 9:43 AM

## 2012-12-23 NOTE — Plan of Care (Signed)
Problem: Phase II Progression Outcomes Goal: Discharge plan established Outcome: Completed/Met Date Met:  12/23/12 Pt for SNF placement at discharge per PT recc.

## 2012-12-23 NOTE — Progress Notes (Signed)
Orthopedic Tech Progress Note Patient Details:  Gary [name] 08-23-1941 [id number] OHF with trapeze bar applied to bed.  Patient ID: [name], male   DOB: [birth year], 72 y.o.   MRN: [id number]   Orie Rout 12/23/2012, 1:27 PM

## 2012-12-23 NOTE — Clinical Social Work Psychosocial (Signed)
CSW received consult for SNF Placement for patient pending PT Eval. CSW met with patient at bedside. Provided SNF List. Psychosocial assessment on shadow chart. Weekday CSW will follow up pending PT Eval recommendations.  Ricke Hey, [place] [phone] (weekend)

## 2012-12-24 DIAGNOSIS — C049 Malignant neoplasm of floor of mouth, unspecified: Secondary | ICD-10-CM

## 2012-12-24 DIAGNOSIS — I4891 Unspecified atrial fibrillation: Secondary | ICD-10-CM

## 2012-12-24 DIAGNOSIS — C062 Malignant neoplasm of retromolar area: Secondary | ICD-10-CM | POA: Diagnosis present

## 2012-12-24 DIAGNOSIS — I480 Paroxysmal atrial fibrillation: Secondary | ICD-10-CM | POA: Diagnosis not present

## 2012-12-24 LAB — BASIC METABOLIC PANEL
BUN: 16 mg/dL (ref 6–23)
Chloride: 105 mEq/L (ref 96–112)
Glucose, Bld: 92 mg/dL (ref 70–99)
Potassium: 3.8 mEq/L (ref 3.5–5.1)

## 2012-12-24 LAB — CBC
HCT: 32 % — ABNORMAL LOW (ref 39.0–52.0)
Hemoglobin: 10.8 g/dL — ABNORMAL LOW (ref 13.0–17.0)
MCHC: 33.8 g/dL (ref 30.0–36.0)
WBC: 6.8 10*3/uL (ref 4.0–10.5)

## 2012-12-24 MED ORDER — METOPROLOL TARTRATE 1 MG/ML IV SOLN
2.5000 mg | Freq: Once | INTRAVENOUS | Status: AC
Start: 2012-12-24 — End: 2012-12-24
  Administered 2012-12-24: 2.5 mg via INTRAVENOUS

## 2012-12-24 MED ORDER — METOPROLOL TARTRATE 1 MG/ML IV SOLN
INTRAVENOUS | Status: AC
  Administered 2012-12-24: 2.5 mg via INTRAVENOUS
  Filled 2012-12-24: qty 5

## 2012-12-24 MED ORDER — BISOPROLOL FUMARATE 10 MG PO TABS
10.0000 mg | ORAL_TABLET | Freq: Every day | ORAL | Status: DC
  Administered 2012-12-25 – 2012-12-26 (×2): 10 mg via ORAL
  Filled 2012-12-24 (×2): qty 1

## 2012-12-24 MED ORDER — RIVAROXABAN 20 MG PO TABS
20.0000 mg | ORAL_TABLET | Freq: Every day | ORAL | Status: DC
  Administered 2012-12-24 – 2012-12-25 (×2): 20 mg via ORAL
  Filled 2012-12-24 (×3): qty 1

## 2012-12-24 NOTE — Consult Note (Signed)
Pt. Seen and examined. Agree with the NP/PA-C note as written.  72 yo male with history of CAD and prior stent by Dr. [name], admitted Saturday with a fall and hip fracture. He is s/p orthopedic repair (pinning) and has been having paroxysmal atrial fibrillation with rapid ventricular response as well as periods of bradycardia. He also has OSA on CPAP, HTN, thyroid disease and a squamous cell carcinoma (which is being planned to be surgically removed).  Echo is being performed now.  I agree that with his CHADS2VASC score he is a candidate for anticoagulation and xarelto is a good choice. Further ischemia testing could be done as an outpatient and a 30 day monitor is appropriate to evaluate for more reliable evidence of sick-sinus syndrome.  Chrystie Nose, MD, [facility] Attending Cardiologist [facility]

## 2012-12-24 NOTE — Progress Notes (Signed)
PT Cancellation Note  Patient Details Name: Gary [name] MRN: [id number] DOB: 02/08/1941   Cancelled Treatment:  Pt not seen for PT due to being on hold per RN, MD.  Pt in a-fib and not to participate in therapy at this time.      Kacelyn Rowzee 12/24/2012, 11:37 AM

## 2012-12-24 NOTE — Progress Notes (Signed)
Patient refused cpap tonight. RT will continue to monitor. 

## 2012-12-24 NOTE — Progress Notes (Signed)
Dr Gwenlyn Perking notified that patient HR sustaining in 130s afib. Levonne Spiller, RN

## 2012-12-24 NOTE — Consult Note (Signed)
Reason for Consult: Afib RVR Referring Physician:   BOBY EYER is an 72 y.o. male.  HPI:   The patient is a 72 yo male, who was a former patient of Dr. [name], with a history of CAD with normal EF.  In 2002 he had a coronary angiogram which resulted in PCI to the proximal LAD.  RCA had 50% focal eccentric stenosis.  EF at that time was 70%.  His history also includes HTN, HLD, OSA-uses CPAP, CKD, GERD, Squamous cell car., hypothyroidism.  He was supposed to have the SCC removed within the next week or so.  He presented with a left femoral neck fracture after tripping on one of the cement patio blocks that make up his walkway.  He is POD #2.  This morning he has been having recurrent PAF with rapid ventricular response as well as bradycardia.  Home cardiac meds include: ASA, bisoprolol, Omega 3, statin, potassium, synthroid.  He denies N, V, fever, dizziness, CP, SOB, orthopnea, PND, LEE, Abd pain, dysuria, hematuria.   He is currently in NSR at 73 BPM.     Past Medical History  Diagnosis Date  . Coronary artery disease     stent placement 2000, does not see cardiologist at this time  . Hypertension     sees Dr. Hart Carwin  . Thyroid disease   . Anxiety   . Sleep apnea     uses cpap machine  . Chronic kidney disease     renal insufficiency, [place] kidney  . GERD (gastroesophageal reflux disease)   . Arthritis     rhematoid arthritis  . Gout     pseudo gout  . Cancer     pre-cancerous lesions removed 11/22/12 and hx of  . Anemia     "chronic anemic"  . Hepatitis     hepatitis "C"  . Chronic back pain     hx of  . 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 Anxiety   . Hypertension   . Dyslipidemia   . Hypothyroid   . CAD (coronary artery disease)     s/p stenting  . Insomnia     cpap  . Full dentures   . Hearing impairment     job related, hbilat hearing aids    Past Surgical History  Procedure Laterality Date  . Tonsillectomy    . Coronary  angioplasty  2000    stent placement 2000  . Dental extractions      upper and lower    Family History  Problem Relation Age of Onset  . Coronary artery disease Brother 17  . Heart attack Brother   . Coronary artery disease Father     stents in 70's  . Cancer Mother     bone    Social History:  reports that he quit smoking about 22 years ago. He does not have any smokeless tobacco history on file. He reports that  drinks alcohol. He reports that he does not use illicit drugs.  Allergies:  Allergies  Allergen Reactions  . Cellcept (Mycophenolate Mofetil) Other (See Comments)    Reaction unknown  . Colcrys (Colchicine) Other (See Comments)    myalgias  . Erythromycin Other (See Comments)    Reaction unknown  . Methotrexate Derivatives Hives, Itching and Rash    Only in tablet form  . Neosporin (Neomycin-Bacitracin Zn-Polymyx) Rash  . Ultram (Tramadol) Rash    Medications: . acyclovir  400 mg Oral BID  .  aspirin EC  325 mg Oral Q breakfast  . [START ON 12/25/2012] bisoprolol  10 mg Oral Daily  . docusate sodium  100 mg Oral BID  . dorzolamide-timolol  1 drop Both Eyes BID  . enoxaparin (LOVENOX) injection  40 mg Subcutaneous Q24H  . feeding supplement  237 mL Oral TID BM  . ferrous sulfate  325 mg Oral TID PC  . levofloxacin (LEVAQUIN) IV  750 mg Intravenous Q24H  . levothyroxine  200 mcg Oral Q breakfast  . pantoprazole  40 mg Oral Daily  . polyethylene glycol  17 g Oral Daily  . predniSONE  5 mg Oral Daily     Results for orders placed during the hospital encounter of 12/21/12 (from the past 48 hour(s))  LEGIONELLA ANTIGEN, URINE     Status: None   Collection Time    12/23/12  2:21 AM      Result Value Range   Specimen Description URINE, RANDOM     Special Requests NONE     Legionella Antigen, Urine Negative for Legionella pneumophilia serogroup 1     Report Status 12/23/2012 FINAL    STREP PNEUMONIAE URINARY ANTIGEN     Status: None   Collection Time     12/23/12  2:21 AM      Result Value Range   Strep Pneumo Urinary Antigen NEGATIVE  NEGATIVE   Comment:            Infection due to S. pneumoniae     cannot be absolutely ruled out     since the antigen present     may be below the detection limit     of the test.  CBC     Status: Abnormal   Collection Time    12/23/12  6:35 AM      Result Value Range   WBC 8.1  4.0 - 10.5 K/uL   RBC 3.35 (*) 4.22 - 5.81 MIL/uL   Hemoglobin 10.5 (*) 13.0 - 17.0 g/dL   HCT 16.1 (*) 09.6 - 04.5 %   MCV 93.4  78.0 - 100.0 fL   MCH 31.3  26.0 - 34.0 pg   MCHC 33.5  30.0 - 36.0 g/dL   RDW 40.9  81.1 - 91.4 %   Platelets 139 (*) 150 - 400 K/uL  BASIC METABOLIC PANEL     Status: Abnormal   Collection Time    12/23/12  6:35 AM      Result Value Range   Sodium 137  135 - 145 mEq/L   Potassium 3.8  3.5 - 5.1 mEq/L   Chloride 102  96 - 112 mEq/L   CO2 26  19 - 32 mEq/L   Glucose, Bld 97  70 - 99 mg/dL   BUN 18  6 - 23 mg/dL   Creatinine, Ser 7.82  0.50 - 1.35 mg/dL   Calcium 8.8  8.4 - 95.6 mg/dL   GFR calc non Af Amer 55 (*) >90 mL/min   GFR calc Af Amer 63 (*) >90 mL/min   Comment:            The eGFR has been calculated     using the CKD EPI equation.     This calculation has not been     validated in all clinical     situations.     eGFR's persistently     <90 mL/min signify     possible Chronic Kidney Disease.  CBC     Status: Abnormal  Collection Time    12/24/12  5:25 AM      Result Value Range   WBC 6.8  4.0 - 10.5 K/uL   RBC 3.43 (*) 4.22 - 5.81 MIL/uL   Hemoglobin 10.8 (*) 13.0 - 17.0 g/dL   HCT 81.1 (*) 91.4 - 78.2 %   MCV 93.3  78.0 - 100.0 fL   MCH 31.5  26.0 - 34.0 pg   MCHC 33.8  30.0 - 36.0 g/dL   RDW 95.6  21.3 - 08.6 %   Platelets 160  150 - 400 K/uL  BASIC METABOLIC PANEL     Status: Abnormal   Collection Time    12/24/12  5:25 AM      Result Value Range   Sodium 140  135 - 145 mEq/L   Potassium 3.8  3.5 - 5.1 mEq/L   Chloride 105  96 - 112 mEq/L   CO2 29  19 -  32 mEq/L   Glucose, Bld 92  70 - 99 mg/dL   BUN 16  6 - 23 mg/dL   Creatinine, Ser 5.78  0.50 - 1.35 mg/dL   Calcium 9.1  8.4 - 46.9 mg/dL   GFR calc non Af Amer 53 (*) >90 mL/min   GFR calc Af Amer 61 (*) >90 mL/min   Comment:            The eGFR has been calculated     using the CKD EPI equation.     This calculation has not been     validated in all clinical     situations.     eGFR's persistently     <90 mL/min signify     possible Chronic Kidney Disease.    No results found.  Review of Systems  Constitutional: Negative for fever and diaphoresis.  Respiratory: Negative for shortness of breath.   Cardiovascular: Positive for palpitations. Negative for chest pain, orthopnea, leg swelling and PND.  Gastrointestinal: Negative for nausea, vomiting, abdominal pain, constipation, blood in stool and melena.  Genitourinary: Negative for dysuria and hematuria.  Neurological: Negative for dizziness.   Blood pressure 148/70, pulse 76, temperature 98.8 F (37.1 C), temperature source Oral, resp. rate 20, height 6' 0.84" (1.85 m), weight 82.1 kg (181 lb), SpO2 94.00%. Physical Exam  Constitutional: He is oriented to person, place, and time. He appears well-developed and well-nourished. No distress.  HENT:  Head: Normocephalic and atraumatic.  Mouth/Throat: No oropharyngeal exudate.  Eyes: EOM are normal. Pupils are equal, round, and reactive to light. No scleral icterus.  Neck: Normal range of motion. Neck supple. No JVD present.  Cardiovascular: Normal rate, regular rhythm, S1 normal and S2 normal.   No murmur heard. Pulses:      Radial pulses are 2+ on the right side, and 2+ on the left side.       Dorsalis pedis pulses are 1+ on the right side, and 1+ on the left side.  No carotid bruit  Respiratory: Effort normal and breath sounds normal. He has no wheezes. He has no rales.  GI: Soft. Bowel sounds are normal. He exhibits no distension. There is no tenderness.  Musculoskeletal:  He exhibits no edema.  Lymphadenopathy:    He has no cervical adenopathy.  Neurological: He is alert and oriented to person, place, and time. He exhibits normal muscle tone.  Skin: Skin is warm and dry.  Psychiatric: He has a normal mood and affect.    Assessment/Plan: Afib with RVR Bradycardia   Patient  Active Problem List  Diagnosis  . Coronary artery disease  . Hypertension  . Thyroid disease  . Anxiety  . Sleep apnea  . Chronic kidney disease  . GERD (gastroesophageal reflux disease)  . Arthritis  . Gout  . Skin cancer (removed on back, neck, hands, forearms last 11/22/12  . Anemia  . Hepatitis  . Chronic back pain  . Dyslipidemia  . Hypothyroid  . CAD (coronary artery disease)  . Hypoxia  . Hip fracture, left  . Fever  . Confusion state   Plan:  PAF which apparently is new for him.  He seems to be asymptomatic from it.   He was given one dose of IV lopressor 2.5mg .  Labs are stable.  CHADsVASC of 3. Would consider Xarelto in light of needing further surgery for SCC.  Progression of CAD is a concern given previous known RCA disease.   Lexiscan would be appropriate and could be done as OP.  He is having periods of bradycardia into the 40's which will make rate control problematic.  A 30 day HR monitor would be a good Idea to evaluate the extent of Afib.  I would monitor here for one more day.  From an ortho standpoint, he can go to SNF short term.    Jameia Makris 12/24/2012, 11:33 AM

## 2012-12-24 NOTE — Progress Notes (Signed)
Pt converted from SB in 40s into afib 80s-100s. Pt asymptomatic. VSS. EKG obtained to confirm. Bisoprolol 5mg  given to patient with morning meds. Dr. Gwenlyn Perking notified and made aware. Will continue to monitor closely. Levonne Spiller, RN

## 2012-12-24 NOTE — Progress Notes (Signed)
OT Cancellation Note  Patient Details Name: Gary [name] MRN: [id number] DOB: [birth year]   Cancelled Treatment:    Reason Eval/Treat Not Completed: Patient not medically ready (RN Chasidy requesting to hold due rapid v-tach and AFIB)  Lucile Shutters Pager: [phone]  12/24/2012, 3:10 PM

## 2012-12-24 NOTE — Progress Notes (Signed)
Subjective: 2 Days Post-Op Procedure(s) (LRB): CANNULATED HIP PINNING (Left) Patient reports pain as moderate.  Up with therapy yesterday and recommendation for short-term SNF placement.  Objective: Vital signs in last 24 hours: Temp:  [98.5 F (36.9 C)-98.8 F (37.1 C)] 98.8 F (37.1 C) (02/17 0500) Pulse Rate:  [60-62] 60 (02/17 0500) Resp:  [16-18] 18 (02/17 0500) BP: (130-150)/(72-81) 150/78 mmHg (02/17 0500) SpO2:  [91 %-96 %] 93 % (02/17 0500)  Intake/Output from previous day: 02/16 0701 - 02/17 0700 In: 1250 [I.V.:1250] Out: 1501 [Urine:1500; Stool:1] Intake/Output this shift:     Recent Labs  12/22/12 0600 12/23/12 0635 12/24/12 0525  HGB 10.5* 10.5* 10.8*    Recent Labs  12/23/12 0635 12/24/12 0525  WBC 8.1 6.8  RBC 3.35* 3.43*  HCT 31.3* 32.0*  PLT 139* 160    Recent Labs  12/23/12 0635 12/24/12 0525  NA 137 140  K 3.8 3.8  CL 102 105  CO2 26 29  BUN 18 16  CREATININE 1.28 1.32  GLUCOSE 97 92  CALCIUM 8.8 9.1   No results found for this basename: LABPT, INR,  in the last 72 hours  Sensation intact distally Intact pulses distally Dorsiflexion/Plantar flexion intact Incision: dressing C/D/I  Assessment/Plan: 2 Days Post-Op Procedure(s) (LRB): CANNULATED HIP PINNING (Left) Discharge to SNF when medically clear (ok for D/C from ortho standpoint) Aspirin as DVT med once daily at D/C; pain med script on chart Needs FL-2  Kathryne Hitch 12/24/2012, 7:35 AM

## 2012-12-24 NOTE — Progress Notes (Signed)
  Echocardiogram 2D Echocardiogram has been performed.  Gary [name] A 12/24/2012, 3:03 PM

## 2012-12-24 NOTE — Progress Notes (Signed)
[facility] PROGRESS NOTE  Gary [name] HYQ:[id number] DOB: 11-Mar-1941 DOA: 12/21/2012 PCP: [name], MD  Assessment/Plan: 1-acute toxic encephalopathy: secondary to PNA and fever.  -afebrile and with normal WBC's -O2 sat on RA at goal -Continue levaquin IV one more day -PRN oxygen -PRN albuterol -transition to PO on 2/18; complete a total of 8 days.  2-hip fracture: per ortho -s/p hip surgery -PT/OT has recommended SNF -continue lovenox -chronic anticoagulation for PAF; maybe xarelto vs eliquis vs coumadin; will follow cardiology rec's. -foley discontinue and per ortho reaady for discharge  3-hypothyroidism: continue synthroid  4-GERD:continue PPI  5-RA: continue chronic prednisone  6-OSA: continue CPAP qhs (patient reprots he will use it)  7-glaucoma: continue eye drops.  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  11-PAF: paroxysmal atrial fibrillation; will give one dose of IV lopressor; check 2-D echo and consult cardiology for further recommendations. Patient with CHADS score of 3; will need long term anticoagulation.  DVT: on lovenox  Code Status: Full Family Communication: no family at bedside. Disposition Plan:per PT rec's; either home with HHPT vs SNF.   Consultants:  Ortho  cardiology  PT  Procedures:  Hip surgery  Antibiotics:  levaquin  HPI/Subjective: Afebrile, no CP or SOB; Day 2 S/P open reduction and cannulated screw fixation of left hip femoral neck fracture.NAD; experiencing in and out Atrial fibrillation, asymptomatic.  Objective: Filed Vitals:   12/24/12 1122 12/24/12 1146 12/24/12 1534 12/24/12 2022  BP:    132/73  Pulse: 76   63  Temp:    99.1 F (37.3 C)  TempSrc:    Oral  Resp:  18 20 18   Height:      Weight:      SpO2:  94% 93% 95%    Intake/Output Summary (Last 24 hours) at 12/24/12 2224 Last  data filed at 12/24/12 0600  Gross per 24 hour  Intake    400 ml  Output    500 ml  Net   -100 ml   Filed Weights   12/21/12 0300 12/22/12 0533  Weight: 83.915 kg (185 lb) 82.1 kg (181 lb)    Exam:   General:  AAOX3, no fever; reports pain is fairly well control   Cardiovascular: irregular, tachycardic; no rubs, murmurs or gallops  Respiratory: good air movement, no wheezing  Abdomen: soft, NT, ND; positive BS  Neuro: non focal deficit.  Data Reviewed: Basic Metabolic Panel:  Recent Labs Lab 12/21/12 0044 12/22/12 0600 12/23/12 0635 12/24/12 0525  NA 140 139 137 140  K 3.4* 3.6 3.8 3.8  CL 104 105 102 105  CO2 26 26 26 29   GLUCOSE 110* 96 97 92  BUN 29* 25* 18 16  CREATININE 1.74* 1.39* 1.28 1.32  CALCIUM 8.8 8.8 8.8 9.1   Liver Function Tests:  Recent Labs Lab 12/21/12 0044  AST 22  ALT 15  ALKPHOS 75  BILITOT 0.9  PROT 6.2  ALBUMIN 2.6*   CBC:  Recent Labs Lab 12/21/12 0044 12/22/12 0600 12/23/12 0635 12/24/12 0525  WBC 10.6* 7.6 8.1 6.8  NEUTROABS  --  5.7  --   --   HGB 11.8* 10.5* 10.5* 10.8*  HCT 35.0* 32.1* 31.3* 32.0*  MCV 94.6 95.0 93.4 93.3  PLT 134* 123* 139* 160   Cardiac Enzymes:  Recent Labs Lab 12/21/12 2055 12/22/12 0043 12/22/12 0600  TROPONINI <0.30 <0.30 <0.30   BNP (last 3  results) No results found for this basename: PROBNP,  in the last 8760 hours CBG: No results found for this basename: GLUCAP,  in the last 168 hours  Recent Results (from the past 240 hour(s))  URINE CULTURE     Status: None   Collection Time    12/21/12 12:52 AM      Result Value Range Status   Specimen Description URINE, CATHETERIZED   Final   Special Requests NONE   Final   Culture  Setup Time 12/21/2012 02:06   Final   Colony Count NO GROWTH   Final   Culture NO GROWTH   Final   Report Status 12/22/2012 FINAL   Final  CULTURE, BLOOD (ROUTINE X 2)     Status: None   Collection Time    12/21/12 12:55 AM      Result Value Range  Status   Specimen Description BLOOD RIGHT ARM   Final   Special Requests BOTTLES DRAWN AEROBIC AND ANAEROBIC 10CC EACH   Final   Culture  Setup Time 12/21/2012 13:20   Final   Culture     Final   Value:        BLOOD CULTURE RECEIVED NO GROWTH TO DATE CULTURE WILL BE HELD FOR 5 DAYS BEFORE ISSUING A FINAL NEGATIVE REPORT   Report Status PENDING   Incomplete  CULTURE, BLOOD (ROUTINE X 2)     Status: None   Collection Time    12/21/12  1:05 AM      Result Value Range Status   Specimen Description BLOOD RIGHT HAND   Final   Special Requests BOTTLES DRAWN AEROBIC AND ANAEROBIC 5CC EACH   Final   Culture  Setup Time 12/21/2012 13:20   Final   Culture     Final   Value:        BLOOD CULTURE RECEIVED NO GROWTH TO DATE CULTURE WILL BE HELD FOR 5 DAYS BEFORE ISSUING A FINAL NEGATIVE REPORT   Report Status PENDING   Incomplete  SURGICAL PCR SCREEN     Status: Abnormal   Collection Time    12/22/12  3:24 AM      Result Value Range Status   MRSA, PCR NEGATIVE  NEGATIVE Final   Staphylococcus aureus POSITIVE (*) NEGATIVE Final   Comment:            The Xpert SA Assay (FDA     approved for NASAL specimens     in patients over 62 years of age),     is one component of     a comprehensive surveillance     program.  Test performance has     been validated by [employer] for patients greater     than or equal to 72 year old.     It is not intended     to diagnose infection nor to     guide or monitor treatment.     Studies: No results found.  Scheduled Meds: . acyclovir  400 mg Oral BID  . aspirin EC  325 mg Oral Q breakfast  . [START ON 12/25/2012] bisoprolol  10 mg Oral Daily  . docusate sodium  100 mg Oral BID  . dorzolamide-timolol  1 drop Both Eyes BID  . feeding supplement  237 mL Oral TID BM  . ferrous sulfate  325 mg Oral TID PC  . levofloxacin (LEVAQUIN) IV  750 mg Intravenous Q24H  . levothyroxine  200 mcg Oral Q breakfast  .  pantoprazole  40 mg Oral Daily  .  polyethylene glycol  17 g Oral Daily  . predniSONE  5 mg Oral Daily  . rivaroxaban  20 mg Oral QAC supper   Continuous Infusions: . sodium chloride 50 mL/hr at 12/22/12 2019    Principal Problem:   Fever Active Problems:   Hypertension   Chronic kidney disease   CAD (coronary artery disease)   Hypoxia   Hip fracture, left   Confusion state    Time spent: >30 minutes    Temia Debroux  [facility] Pager [phone]. If 8PM-8AM, please contact night-coverage at [email or web address], password [facility] 12/24/2012, 10:24 PM  LOS: 3 days

## 2012-12-24 NOTE — Progress Notes (Signed)
   CARE MANAGEMENT NOTE 12/24/2012  Patient:  Gary [name], Gary [name]   Account Number:  [id number]  Date Initiated:  12/24/2012  Documentation initiated by:  Donn Pierini  Subjective/Objective Assessment:   Pt admitted with hip fx s/p repair     Action/Plan:   PTA pt lived at home with spouse- PT/OT eval   Anticipated DC Date:  12/26/2012   Anticipated DC Plan:  SKILLED NURSING FACILITY  In-house referral  Clinical Social Worker      DC Planning Services  CM consult      Choice offered to / List presented to:             Status of service:  In process, will continue to follow Medicare Important Message given?   (If response is "NO", the following Medicare IM given date fields will be blank) Date Medicare IM given:   Date Additional Medicare IM given:    Discharge Disposition:    Per UR Regulation:  Reviewed for med. necessity/level of care/duration of stay  If discussed at Long Length of Stay Meetings, dates discussed:    Comments:  12/24/12- 1530- Donn Pierini RN, BSN [phone] Spoke with pt at bedside regarding d/c plans HH vs ST-SNF - pt reports that he lives at home with spouse who still drives- gives permission for this CM to contact wife via phone as she is not present. Pt states that he would like to go home but is agreeable to placement for ST rehab prior to returning home. Explained that we would do search for available options - pt agreeable to do this at this time- explained that this CM would  Have CSW come back to see him- pt agreeable. Contacted CSW regarding placement

## 2012-12-24 NOTE — Progress Notes (Signed)
CSW spoke with patient this morning to discuss possible need for SNF- he voices interest/preference in going home at d/c and having HH/DME arranged. Discussed concern that he was needing a high level of assistance with PT (+2 total assist) and that he may need a ST-SNF stay prior to d/c home-  CSW will f/u with patient to further discuss SNF at d/c. Patient did give this CSW permission to call his wife and I am awaiting a callback. Reece Levy, MSW, Theresia Majors [phone]

## 2012-12-25 DIAGNOSIS — D649 Anemia, unspecified: Secondary | ICD-10-CM

## 2012-12-25 DIAGNOSIS — Z7901 Long term (current) use of anticoagulants: Secondary | ICD-10-CM

## 2012-12-25 DIAGNOSIS — F411 Generalized anxiety disorder: Secondary | ICD-10-CM

## 2012-12-25 DIAGNOSIS — I5189 Other ill-defined heart diseases: Secondary | ICD-10-CM | POA: Diagnosis present

## 2012-12-25 LAB — CBC
HCT: 31.1 % — ABNORMAL LOW (ref 39.0–52.0)
Hemoglobin: 10.4 g/dL — ABNORMAL LOW (ref 13.0–17.0)
MCH: 31.3 pg (ref 26.0–34.0)
MCHC: 33.4 g/dL (ref 30.0–36.0)
MCV: 93.7 fL (ref 78.0–100.0)

## 2012-12-25 LAB — BASIC METABOLIC PANEL
BUN: 17 mg/dL (ref 6–23)
Calcium: 9.1 mg/dL (ref 8.4–10.5)
GFR calc non Af Amer: 56 mL/min — ABNORMAL LOW (ref >90)
Glucose, Bld: 98 mg/dL (ref 70–99)

## 2012-12-25 LAB — TSH: TSH: 2.562 u[IU]/mL (ref 0.350–4.500)

## 2012-12-25 MED ORDER — ALPRAZOLAM 0.5 MG PO TABS: 0.5000 mg | ORAL_TABLET | Freq: Every evening | ORAL | Status: DC | PRN

## 2012-12-25 MED ORDER — DIAZEPAM 5 MG PO TABS: 5.0000 mg | ORAL_TABLET | Freq: Two times a day (BID) | ORAL | Status: AC | PRN

## 2012-12-25 MED ORDER — FERROUS SULFATE 325 (65 FE) MG PO TABS: 325.0000 mg | ORAL_TABLET | Freq: Three times a day (TID) | ORAL | Status: DC

## 2012-12-25 MED ORDER — ENSURE COMPLETE PO LIQD: 237.0000 mL | Freq: Three times a day (TID) | ORAL | Status: DC

## 2012-12-25 MED ORDER — BISOPROLOL FUMARATE 5 MG PO TABS: 10.0000 mg | ORAL_TABLET | Freq: Every day | ORAL | Status: DC

## 2012-12-25 MED ORDER — LEVOFLOXACIN 750 MG PO TABS: 750.0000 mg | ORAL_TABLET | Freq: Every day | ORAL | Status: AC

## 2012-12-25 MED ORDER — ASPIRIN EC 81 MG PO TBEC
81.0000 mg | DELAYED_RELEASE_TABLET | Freq: Every day | ORAL | Status: DC
  Administered 2012-12-26: 81 mg via ORAL
  Filled 2012-12-25 (×2): qty 1

## 2012-12-25 MED ORDER — DSS 100 MG PO CAPS: 100.0000 mg | ORAL_CAPSULE | Freq: Two times a day (BID) | ORAL | Status: DC

## 2012-12-25 MED ORDER — RIVAROXABAN 20 MG PO TABS: 20.0000 mg | ORAL_TABLET | Freq: Every day | ORAL | Status: DC

## 2012-12-25 NOTE — Progress Notes (Signed)
Clinical Social Work Department CLINICAL SOCIAL WORK PLACEMENT NOTE 12/25/2012  Patient:  RAMELLO, CORDIAL  Account Number:  [id number] Admit date:  12/21/2012  Clinical Social Worker:  Robin Searing  Date/time:  12/25/2012 11:50 AM  Clinical Social Work is seeking post-discharge placement for this patient at the following level of care:   SKILLED NURSING   (*CSW will update this form in Epic as items are completed)   12/25/2012  Patient/family provided with [facility] Department of Clinical Social Work's list of facilities offering this level of care within the geographic area requested by the patient (or if unable, by the patient's family).  12/25/2012  Patient/family informed of their freedom to choose among providers that offer the needed level of care, that participate in Medicare, Medicaid or managed care program needed by the patient, have an available bed and are willing to accept the patient.  12/25/2012  Patient/family informed of [facility]' ownership interest in [facility], as well as of the fact that they are under no obligation to receive care at this facility.  PASARR submitted to EDS on 12/25/2012 PASARR number received from [employer] on 12/25/2012  FL2 transmitted to all facilities in geographic area requested by pt/family on  12/25/2012 FL2 transmitted to all facilities within larger geographic area on   Patient informed that his/her managed care company has contracts with or will negotiate with  certain facilities, including the following:     Patient/family informed of bed offers received:   Patient chooses bed at  Physician recommends and patient chooses bed at    Patient to be transferred to  on   Patient to be transferred to facility by   The following physician request were entered in Epic:   Additional Comments:  Reece Levy, MSW, Theresia Majors [phone]

## 2012-12-25 NOTE — Progress Notes (Signed)
Patient has selected SNF bed at [facility] and [facility]- now awaiting insurance auth for transfer- hopeful for today- will advise.  Reece Levy, MSW, Theresia Majors [phone]

## 2012-12-25 NOTE — Evaluation (Signed)
Occupational Therapy Evaluation Patient Details Name: Gary [name] MRN: [id number] DOB: 1941-07-30 Today's Date: 12/25/2012 Time: 1050-1110 OT Time Calculation (min): 20 min  OT Assessment / Plan / Recommendation Clinical Impression  72 yo male s/p fall in snow with ORIF Lt hip that coudl benefit from skilled OT acutley. recommend snf for d/c planning    OT Assessment  Patient needs continued OT Services    Follow Up Recommendations  SNF    Barriers to Discharge      Equipment Recommendations  3 in 1 bedside comode    Recommendations for Other Services    Frequency  Min 2X/week    Precautions / Restrictions Precautions Precautions: Fall Restrictions LLE Weight Bearing: Partial weight bearing LLE Partial Weight Bearing Percentage or Pounds: 25   Pertinent Vitals/Pain Zero out of 10 supine    ADL  Eating/Feeding: Set up Where Assessed - Eating/Feeding: Chair Grooming: Wash/dry face;Wash/dry hands;Supervision/safety Where Assessed - Grooming: Supported sitting Toilet Transfer: Minimal Dentist Method: Sit to Barista: Raised toilet seat with arms (or 3-in-1 over toilet) Equipment Used: Gait belt;Rolling walker Transfers/Ambulation Related to ADLs: Pt able to transfer and maintain PWB with (A) for RW and lines/leads ADL Comments: Pt progressing well and reports decr pain this session compared to previous PT evaluation. Pt progressing quickly    OT Diagnosis: Generalized weakness;Acute pain  OT Problem List: Decreased strength;Decreased activity tolerance;Impaired balance (sitting and/or standing);Decreased safety awareness;Decreased knowledge of use of DME or AE;Decreased knowledge of precautions;Pain OT Treatment Interventions: Self-care/ADL training;DME and/or AE instruction;Therapeutic activities;Patient/family education;Balance training;Therapeutic exercise   OT Goals Acute Rehab OT Goals OT Goal Formulation: With  patient Time For Goal Achievement: 01/08/13 Potential to Achieve Goals: Good ADL Goals Pt Will Perform Lower Body Bathing: with modified independence;Sit to stand from chair ADL Goal: Lower Body Bathing - Progress: Goal set today Pt Will Perform Lower Body Dressing: with modified independence;Sit to stand from chair ADL Goal: Lower Body Dressing - Progress: Goal set today Pt Will Transfer to Toilet: with modified independence;Ambulation;3-in-1 ADL Goal: Toilet Transfer - Progress: Goal set today  Visit Information  Last OT Received On: 12/25/12 Assistance Needed: +1 PT/OT Co-Evaluation/Treatment: Yes    Subjective Data  Subjective: to go home Patient Stated Goal:  to return home sono   Prior Functioning     Home Living Available Help at Discharge: Skilled Nursing Facility Prior Function Level of Independence: Independent Able to Take Stairs?: Yes Driving: Yes Vocation: Retired 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 Communication Communication: No difficulties Dominant Hand: Right         Vision/Perception Vision - History Baseline Vision: Wears glasses all the time Patient Visual Report: No change from baseline   Cognition  Cognition Overall Cognitive Status: Appears within functional limits for tasks assessed/performed Arousal/Alertness: Awake/alert Cognition - Other Comments: not specifically tested    Extremity/Trunk Assessment Right Upper Extremity Assessment RUE ROM/Strength/Tone: [facility] for tasks assessed Left Upper Extremity Assessment LUE ROM/Strength/Tone: [facility] for tasks assessed     Mobility Bed Mobility Bed Mobility: Supine to Sit;Sitting - Scoot to Edge of Bed Supine to Sit: 4: Min assist;With rails;HOB elevated (20 degrees) Sitting - Scoot to Edge of Bed: 4: Min assist Details for Bed Mobility Assistance: min assist to support left leg and verbal cues for half bridge technique.   Transfers Sit to Stand: 4: Min assist;From elevated surface;With  upper extremity assist;From bed Stand to Sit: With upper extremity assist;With armrests;To chair/3-in-1 Details for Transfer Assistance:  min assist to come to standing from elevated bed with verbal and tactile cues for safe hand placement.  Cues for foot placement during transition so that pt would maintain PWB 25%.       Exercise Total Joint Exercises Ankle Circles/Pumps: AROM;Both;20 reps;Supine Quad Sets: AROM;Both;10 reps;Supine Gluteal Sets: AROM;Both;10 reps;Supine Short Arc Quad: AROM;Left;10 reps;Supine Heel Slides: AAROM;10 reps;Supine Hip ABduction/ADduction: AAROM;Left;10 reps;Supine Long Arc Quad:  (not within full ROM)   Balance     End of Session OT - End of Session Activity Tolerance: Patient tolerated treatment well Patient left: in chair;with call bell/phone within reach Nurse Communication: Mobility status;Precautions  GO     Lucile Shutters 12/25/2012, 1:42 PM Pager: [phone]

## 2012-12-25 NOTE — Progress Notes (Signed)
Subjective: 3 Days Post-Op Procedure(s) (LRB): CANNULATED HIP PINNING (Left) Patient reports pain as mild.  Otherwise no acute changes.  Wants to go home, but understands he needs short-term SNF.  Objective: Vital signs in last 24 hours: Temp:  [99.1 F (37.3 C)] 99.1 F (37.3 C) (02/18 0555) Pulse Rate:  [63-138] 63 (02/18 0555) Resp:  [14-20] 18 (02/18 0555) BP: (132-148)/(70-73) 138/71 mmHg (02/18 0555) SpO2:  [93 %-96 %] 96 % (02/18 0555)  Intake/Output from previous day: 02/17 0701 - 02/18 0700 In: 1200 [I.V.:1200] Out: -  Intake/Output this shift:     Recent Labs  12/23/12 0635 12/24/12 0525 12/25/12 0503  HGB 10.5* 10.8* 10.4*    Recent Labs  12/24/12 0525 12/25/12 0503  WBC 6.8 8.1  RBC 3.43* 3.32*  HCT 32.0* 31.1*  PLT 160 173    Recent Labs  12/24/12 0525 12/25/12 0503  NA 140 144  K 3.8 4.3  CL 105 109  CO2 29 27  BUN 16 17  CREATININE 1.32 1.25  GLUCOSE 92 98  CALCIUM 9.1 9.1   No results found for this basename: LABPT, INR,  in the last 72 hours  Sensation intact distally Intact pulses distally Dorsiflexion/Plantar flexion intact Incision: dressing C/D/I No cellulitis present  Assessment/Plan: 3 Days Post-Op Procedure(s) (LRB): CANNULATED HIP PINNING (Left) Up with therapy D/C to SNF at any time from ortho standpoint.  Mushka Laconte Y 12/25/2012, 7:10 AM

## 2012-12-25 NOTE — Progress Notes (Signed)
Notified by CMT pts HR dropping to 40's non-sustained.  Pt sleeping soundly--has been refusing CPAP.  Will continue to monitor. Dierdre Highman, RN

## 2012-12-25 NOTE — Progress Notes (Signed)
Subjective:  No complaints. He converted yesterday after one dose of IV Lopressor. Zebeta increased.  Objective:  Vital Signs in the last 24 hours: Temp:  [99.1 F (37.3 C)] 99.1 F (37.3 C) (02/18 0555) Pulse Rate:  [63] 63 (02/18 0555) Resp:  [14-22] 22 (02/18 1106) BP: (132-138)/(71-73) 138/71 mmHg (02/18 0555) SpO2:  [93 %-96 %] 95 % (02/18 1106)  Intake/Output from previous day:  Intake/Output Summary (Last 24 hours) at 12/25/12 1209 Last data filed at 12/25/12 0800  Gross per 24 hour  Intake   1440 ml  Output   1750 ml  Net   -310 ml   . acyclovir  400 mg Oral BID  . [START ON 12/26/2012] aspirin EC  81 mg Oral Q breakfast  . bisoprolol  10 mg Oral Daily  . docusate sodium  100 mg Oral BID  . dorzolamide-timolol  1 drop Both Eyes BID  . feeding supplement  237 mL Oral TID BM  . ferrous sulfate  325 mg Oral TID PC  . levofloxacin (LEVAQUIN) IV  750 mg Intravenous Q24H  . levothyroxine  200 mcg Oral Q breakfast  . pantoprazole  40 mg Oral Daily  . polyethylene glycol  17 g Oral Daily  . predniSONE  5 mg Oral Daily  . rivaroxaban  20 mg Oral QAC supper   Physical Exam: General appearance: alert, cooperative and no distress Lungs: clear to auscultation bilaterally Heart: regular rate and rhythm ABD: BS+ Foley in place. No edema   Rate: 68  Rhythm: normal sinus rhythm  Lab Results:  Recent Labs  12/24/12 0525 12/25/12 0503  WBC 6.8 8.1  HGB 10.8* 10.4*  PLT 160 173    Recent Labs  12/24/12 0525 12/25/12 0503  NA 140 144  K 3.8 4.3  CL 105 109  CO2 29 27  GLUCOSE 92 98  BUN 16 17  CREATININE 1.32 1.25   No results found for this basename: TROPONINI, CK, MB,  in the last 72 hours Hepatic Function Panel No results found for this basename: PROT, ALBUMIN, AST, ALT, ALKPHOS, BILITOT, BILIDIR, IBILI,  in the last 72 hours No results found for this basename: CHOL,  in the last 72 hours No results found for this basename: INR,  in the last 72  hours  Imaging: Imaging results have been reviewed  Cardiac Studies:  Assessment/Plan:   Principal Problem:   PAF, new Feb 2014 with SSS component Active Problems:   CAD, LAD stent 2002   Hip fracture, left, S/P surgical repair 12/22/12   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 stage 2   Diastolic dysfunction, with good LVF 2D Feb 2014   Plan- Decrease ASA to 81mg  now that he is on Xarelto. Document TSH. We will arrange follow up in a couple of weeks as an OP.   Corine Shelter PA-C 12/25/2012, 12:09 PM  Echo Study Conclusions  - Left ventricle: The cavity size was normal. There was mild concentric hypertrophy. Systolic function was normal. The estimated ejection fraction was in the range of 55% to 60%. Wall motion was normal; there were no regional wall motion abnormalities. Doppler parameters are consistent with abnormal left ventricular relaxation (grade 1 diastolic dysfunction). - Left atrium: The atrium was normal in size. - Tricuspid valve: Mild regurgitation. - Pulmonary arteries: PA peak pressure: 34 mmHg + right atrial pressure (S).   Patient seen and examined. Agree with  assessment and plan. Maintaining NSR. LA size normal. Mild pulmonary htn c/w OSA history.   Lennette Bihari, MD, [facility] 12/25/2012 2:14 PM
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
## 2012-12-25 NOTE — Progress Notes (Signed)
Patient refused cpap tonight. RT will continue to monitor. 

## 2012-12-25 NOTE — Progress Notes (Signed)
SNF bed offers provided to patient and wife- they are deciding on preference for d/c today- will advise.  Reece Levy, MSW, Theresia Majors [phone]

## 2012-12-25 NOTE — Progress Notes (Signed)
Physical Therapy Treatment Patient Details Name: Gary [name] MRN: [id number] DOB: 03-Mar-1941 Today's Date: 12/25/2012 Time: 4098-1191 PT Time Calculation (min): 22 min  PT Assessment / Plan / Recommendation Comments on Treatment Session  72 y.o. male admitted to [facility] s/p fall on ice with left hip fx.  He underwent ORIF to left hip and is now 3 days post-op and progressing well with his mobility today increasing gait distance and decreasing assist needed for bed mobility and transfers.  Per SW he should discharge to SNF to start his therapy today.      Follow Up Recommendations  SNF     Does the patient have the potential to tolerate intense rehabilitation    NA  Barriers to Discharge  none      Equipment Recommendations  Rolling walker with 5" wheels    Recommendations for Other Services  none  Frequency Min 6X/week   Plan Discharge plan remains appropriate;Frequency remains appropriate    Precautions / Restrictions Precautions Precautions: Fall Restrictions LLE Weight Bearing: Partial weight bearing LLE Partial Weight Bearing Percentage or Pounds: 25   Pertinent Vitals/Pain Reports no pain at rest.      Mobility  Bed Mobility Bed Mobility: Supine to Sit;Sitting - Scoot to Edge of Bed Supine to Sit: 4: Min assist;With rails;HOB elevated (20 degrees) Sitting - Scoot to Edge of Bed: 4: Min assist Details for Bed Mobility Assistance: min assist to support left leg and verbal cues for half bridge technique.   Transfers Sit to Stand: 4: Min assist;From elevated surface;With upper extremity assist;From bed Stand to Sit: With upper extremity assist;With armrests;To chair/3-in-1 Details for Transfer Assistance: min assist to come to standing from elevated bed with verbal and tactile cues for safe hand placement.  Cues for foot placement during transition so that pt would maintain PWB 25%.   Ambulation/Gait Ambulation/Gait Assistance: 4: Min assist;Other (comment) (with chair to  follow) Ambulation Distance (Feet): 15 Feet Assistive device: Rolling walker Ambulation/Gait Assistance Details: min assist to support/steady pt at the trunk, verbal cues for leg sequencing and continuous reminders of PWB 25% status of left leg.  As pt fatigued he had a harder time maintaining this WB status.   Gait Pattern: Step-to pattern;Antalgic;Trunk flexed Gait velocity: less than 1.8 ft/sec which puts him at risk for recurrent falls.     Exercises Total Joint Exercises Ankle Circles/Pumps: AROM;Both;20 reps;Supine Quad Sets: AROM;Both;10 reps;Supine Gluteal Sets: AROM;Both;10 reps;Supine Short Arc Quad: AROM;Left;10 reps;Supine Heel Slides: AAROM;10 reps;Supine Hip ABduction/ADduction: AAROM;Left;10 reps;Supine Long Arc Quad: AROM;Left;10 reps;Seated;Other (comment) (not within full ROM)    PT Goals Acute Rehab PT Goals PT Goal: Supine/Side to Sit - Progress: Progressing toward goal PT Goal: Sit to Supine/Side - Progress: Progressing toward goal PT Goal: Sit to Stand - Progress: Progressing toward goal PT Goal: Stand to Sit - Progress: Progressing toward goal PT Goal: Ambulate - Progress: Progressing toward goal  Visit Information  Last PT Received On: 12/25/12 Assistance Needed: +1 PT/OT Co-Evaluation/Treatment: Yes    Subjective Data  Subjective: Pt reports he wants to know what is going on re: his possible discharge today.   Patient Stated Goal: to get moving so he can get back home.     Cognition  Cognition Overall Cognitive Status: Appears within functional limits for tasks assessed/performed Arousal/Alertness: Awake/alert Cognition - Other Comments: not specifically tested    Balance     End of Session PT - End of Session Equipment Utilized During Treatment: Gait belt Activity Tolerance: Patient  limited by pain;Patient limited by fatigue Patient left: in chair;with call bell/phone within reach        [facility] B. Lelia Jons, PT, DPT  [phone]   12/25/2012, 1:28 PM

## 2012-12-25 NOTE — Progress Notes (Signed)
Clinical Social Work Department BRIEF PSYCHOSOCIAL ASSESSMENT 12/25/2012  Patient:  GRAVES, NIPP     Account Number:  [id number]     Admit date:  12/21/2012  Clinical Social Worker:  Robin Searing  Date/Time:  12/25/2012 11:44 AM  Referred by:  Physician  Date Referred:  12/25/2012 Referred for  SNF Placement   Other Referral:   Interview type:  Patient Other interview type:   Spoke with wife by phone    PSYCHOSOCIAL DATA Living Status:  FAMILY Admitted from facility:   Level of care:   Primary support name:  wife Primary support relationship to patient:  FAMILY Degree of support available:   good    CURRENT CONCERNS Current Concerns  Post-Acute Placement   Other Concerns:    SOCIAL WORK ASSESSMENT / PLAN Patient and wife are both agreeable to [facility]- discussed coverage/process with them and have advised that he is ready for d/c today per MD.   Assessment/plan status:  Other - See comment Other assessment/ plan:   FL2 and [place] have been completed and sent out for SNF offers   Information/referral to community resources:   SNF    PATIENT'S/FAMILY'S RESPONSE TO PLAN OF CARE: Patient agreeable to plans and I will f.u with him and his wife shortly to give bed offers for selection and d/c today.        Reece Levy, MSW, [facility] [phone]

## 2012-12-26 ENCOUNTER — Encounter (HOSPITAL_COMMUNITY): Payer: Self-pay | Admitting: Orthopaedic Surgery

## 2012-12-26 DIAGNOSIS — I214 Non-ST elevation (NSTEMI) myocardial infarction: Secondary | ICD-10-CM

## 2012-12-26 DIAGNOSIS — Z7901 Long term (current) use of anticoagulants: Secondary | ICD-10-CM

## 2012-12-26 MED ORDER — CYCLOBENZAPRINE HCL 5 MG PO TABS
5.0000 mg | ORAL_TABLET | Freq: Once | ORAL | Status: AC
  Administered 2012-12-26: 5 mg via ORAL
  Filled 2012-12-26: qty 1

## 2012-12-26 NOTE — Progress Notes (Signed)
Pt provided with copy of AVS. An updated copy placed in [facility] to be transported with patient to SNF. Pt has no questions or concerns at this time. Report attempted to be called to [facility]. Pt scheduled for pick up at 1130am.

## 2012-12-26 NOTE — Progress Notes (Signed)
Maintaining sinus. On xarelto and bisoprolol for rate-control. Will arrange follow-up in our office and sign-off. Call with questions.  Chrystie Nose, MD, [facility] Attending Cardiologist [facility]

## 2012-12-26 NOTE — Progress Notes (Signed)
SNF bed confirmed at [facility] for today- patient and wife agreeable- will plan transfer later this morning via EMS.  Reece Levy, MSW, [facility] [phone]

## 2012-12-27 LAB — CULTURE, BLOOD (ROUTINE X 2): Culture: NO GROWTH

## 2013-01-02 ENCOUNTER — Ambulatory Visit (INDEPENDENT_AMBULATORY_CARE_PROVIDER_SITE_OTHER): Payer: Medicare Other | Admitting: Internal Medicine

## 2013-01-02 ENCOUNTER — Encounter: Payer: Self-pay | Admitting: Internal Medicine

## 2013-01-02 VITALS — BP 118/74 | HR 71 | Temp 98.2°F | Ht 71.0 in | Wt 175.0 lb

## 2013-01-02 DIAGNOSIS — R911 Solitary pulmonary nodule: Secondary | ICD-10-CM

## 2013-01-02 NOTE — Patient Instructions (Addendum)
Please schedule a follow up office visit in 4 weeks, sooner if needed with cxr and pft's on return
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
## 2013-01-06 DIAGNOSIS — R911 Solitary pulmonary nodule: Secondary | ICD-10-CM | POA: Insufficient documentation

## 2013-01-06 NOTE — Assessment & Plan Note (Addendum)
12/14/12 PET reviewed 1. 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 There  are small asymmetric level II lymph nodes on the right which are  mildly hypermetabolic and could reflect metastases.  2. No other lesions of the pharyngeal mucosal space identified.  3. Right lung apical nodule and lower lobe air space disease with  associated hypermetabolic activity. Both lung apices were included  on the neck CT performed 2 months ago and appeared normal at that  time. Therefore, these findings are likely all inflammatory   Doubt he could have a "met" or primary develop in such a short time and ? Whether intermittently aspirating ?  In any case he's in no shape for invasive w/u so best bet is simply f/u with serial cxr and will do pft's also on return to see to what extent he could tolerate any invasive procedures should that become necessary.   Discussed in detail all the  indications, usual  risks and alternatives  relative to the benefits with patient who agrees to proceed with conservative f/u

## 2013-01-15 ENCOUNTER — Other Ambulatory Visit (HOSPITAL_COMMUNITY): Payer: Self-pay | Admitting: Cardiology

## 2013-01-15 DIAGNOSIS — Z01818 Encounter for other preprocedural examination: Secondary | ICD-10-CM

## 2013-01-16 ENCOUNTER — Encounter (HOSPITAL_COMMUNITY): Payer: Medicare Other

## 2013-01-18 ENCOUNTER — Ambulatory Visit (HOSPITAL_COMMUNITY)
Admission: RE | Admit: 2013-01-18 | Discharge: 2013-01-18 | Disposition: A | Discharge status: Home / Self Care | Payer: Medicare Other | Source: Ambulatory Visit | Attending: Cardiology | Admitting: Cardiology

## 2013-01-18 DIAGNOSIS — Z01810 Encounter for preprocedural cardiovascular examination: Secondary | ICD-10-CM | POA: Insufficient documentation

## 2013-01-18 DIAGNOSIS — Z01818 Encounter for other preprocedural examination: Secondary | ICD-10-CM

## 2013-01-18 MED ORDER — TECHNETIUM TC 99M SESTAMIBI GENERIC - CARDIOLITE
30.0000 | Freq: Once | INTRAVENOUS | Status: AC | PRN
  Administered 2013-01-18: 30 via INTRAVENOUS

## 2013-01-18 MED ORDER — TECHNETIUM TC 99M SESTAMIBI GENERIC - CARDIOLITE
10.0000 | Freq: Once | INTRAVENOUS | Status: AC | PRN
  Administered 2013-01-18: 10 via INTRAVENOUS

## 2013-01-18 MED ORDER — REGADENOSON 0.4 MG/5ML IV SOLN
0.4000 mg | Freq: Once | INTRAVENOUS | Status: AC
  Administered 2013-01-18: 0.4 mg via INTRAVENOUS

## 2013-01-18 NOTE — Procedures (Addendum)
[name] [address] [place] [place] [address] [phone]  Cardiology Nuclear Med Study  Gary [name] is a 72 y.o. male     MRN : [id number]     DOB: [birth year]  Procedure Date: 01/18/2013  Nuclear Med Background Indication for Stress Test:  Surgical Clearance, Stent Patency, PTCA Patency, Post Hospital and Abnormal EKG History:  CAD;STENT/PTCA-2002 Cardiac Risk Factors: Family History - CAD, History of Smoking, Hypertension, Lipids and Overweight  Symptoms:  DENIES SYMPTOMS   Nuclear Pre-Procedure Caffeine/Decaff Intake:  7:00pm NPO After: 5:00am   IV Site: R Forearm  IV 0.9% NS with Angio Cath:  22g  Chest Size (in):  44"  IV Started by: Emmit Pomfret, RN  Height: 5\' 11"  (1.803 m)  Cup Size: n/a  BMI:  Body mass index is 24.56 kg/(m^2). Weight:  176 lb (79.833 kg)   Tech Comments:  N/A    Nuclear Med Study 1 or 2 day study: 1 day  Stress Test Type:  Lexiscan  Order Authorizing Provider:  Iantha Fallen HILTY,MD    Resting Radionuclide: Technetium 71m Sestamibi  Resting Radionuclide Dose: 10.0 mCi   Stress Radionuclide:  Technetium 85m Sestamibi  Stress Radionuclide Dose: 30.9 mCi           Stress Protocol Rest HR: 43 Stress HR: 71  Rest BP: 141/95 Stress BP: 151/99  Exercise Time (min): n/a METS: n/a   Predicted Max HR: 149 bpm % Max HR: 47.65 bpm Rate Pressure Product: 21308  Dose of Adenosine (mg):  n/a Dose of Lexiscan: 0.4 mg  Dose of Atropine (mg): n/a Dose of Dobutamine: n/a mcg/kg/min (at max HR)  Stress Test Technologist: Esperanza Sheets, CCT Nuclear Technologist: Koren Shiver, CNMT   Rest Procedure:  Myocardial perfusion imaging was performed at rest 45 minutes following the intravenous administration of Technetium 33m Sestamibi. Stress Procedure:  The patient received IV Lexiscan 0.4 mg over 15-seconds.  Technetium 73m Sestamibi injected at 30-seconds.  There were no significant changes with  Lexiscan.  Quantitative spect images were obtained after a 45 minute delay.  Transient Ischemic Dilatation (Normal <1.22):  0.97 Lung/Heart Ratio (Normal <0.45):  0.30 QGS EDV:  71 ml QGS ESV:  24 ml LV Ejection Fraction: 66%  Rest ECG: NSR - Normal EKG  Stress ECG: There are scattered PACs.  QPS Raw Data Images:  Patient motion noted. Noted vertical motion worse in the stress than rest raw projections. Stress Images:  Defect in the basal to mid inferior myocardium Rest Images:  Basal to mid inferior defect, slightly less than stress images Subtraction (SDS):  2  Impression Exercise Capacity:  Lexiscan with no exercise. BP Response:  Hypotensive blood pressure response. Clinical Symptoms:  Mild headahce and dyspnea ECG Impression:  There are scattered PACs. Comparison with Prior Nuclear Study: No previous nuclear study performed  Overall Impression:  Low risk stress nuclear study. There is a mostly fixed inferior defect, which is slightly worse at stress, however, there is translational motion in the raw images which is likely responsible for the inferior defect. No other significant ischemia is noted.  LV Wall Motion:  NL LV Function; NL Wall Motion; EF 66%.  Chrystie Nose, MD, [facility] Board Certified in Nuclear Cardiology Attending Cardiologist [facility]  Chrystie Nose, MD  01/18/2013 9:42 AM

## 2013-01-22 HISTORY — PX: RADICAL NECK DISSECTION: SHX2284

## 2013-01-23 ENCOUNTER — Encounter (HOSPITAL_COMMUNITY): Payer: Medicare Other

## 2013-01-24 NOTE — ED Provider Notes (Signed)
History     CSN: [id number]  Arrival date & time 12/21/12  0028   First MD Initiated Contact with Patient 12/21/12 0029      Chief Complaint  Patient presents with  . Hip Pain  . Altered Mental Status    (Consider location/radiation/quality/duration/timing/severity/associated sxs/prior treatment) Patient is a 72 y.o. male presenting with hip pain and altered mental status. The history is provided by the patient and a relative.  Hip Pain This is a new problem. The current episode started more than 2 days ago. The problem occurs constantly. The problem has been gradually worsening. Pertinent negatives include no chest pain. The symptoms are aggravated by twisting. Nothing relieves the symptoms.  Altered Mental Status This is a new problem. The current episode started 2 days ago. The problem occurs constantly. The problem has been gradually worsening. Pertinent negatives include no chest pain.    Past Medical History  Diagnosis Date  . Coronary artery disease     stent placement 2000, does not see cardiologist at this time  . Hypertension     sees Dr. Hart Carwin  . Thyroid disease   . Anxiety   . Sleep apnea     uses cpap machine  . Chronic kidney disease     renal insufficiency, [place] kidney  . GERD (gastroesophageal reflux disease)   . Arthritis     rhematoid arthritis  . Gout     pseudo gout  . Cancer     pre-cancerous lesions removed 11/22/12 and hx of  . Anemia     "chronic anemic"  . Hepatitis     hepatitis "C"  . Chronic back pain     hx of  . 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 Anxiety   . Hypertension   . Dyslipidemia   . Hypothyroid   . CAD (coronary artery disease)     s/p stenting  . Insomnia     cpap  . Full dentures   . Hearing impairment     job related, hbilat hearing aids    Past Surgical History  Procedure Laterality Date  . Tonsillectomy    . Coronary angioplasty  2000    stent placement 2000  . Dental  extractions      upper and lower  . Hip pinning,cannulated Left 12/22/2012    Procedure: CANNULATED HIP PINNING;  Surgeon: Kathryne Hitch, MD;  Location: [facility] OR;  Service: Orthopedics;  Laterality: Left;    Family History  Problem Relation Age of Onset  . Coronary artery disease Brother 30  . Heart attack Brother   . Coronary artery disease Father     stents in 70's  . Cancer Mother     bone    History  Substance Use Topics  . Smoking status: Former Smoker -- 1.00 packs/day for 27 years    Types: Cigarettes    Quit date: 11/08/1983  . Smokeless tobacco: Never Used  . Alcohol Use: Yes     Comment: "social"      Review of Systems  Unable to perform ROS: Mental status change  Cardiovascular: Negative for chest pain.  Psychiatric/Behavioral: Positive for altered mental status.    Allergies  Cellcept; Colcrys; Erythromycin; Methotrexate derivatives; Neosporin; and Ultram  Home Medications   Current Outpatient Rx  Name  Route  Sig  Dispense  Refill  . acyclovir (ZOVIRAX) 400 MG tablet   Oral   Take 400 mg by mouth 2 (two) times  daily.         . aspirin EC 81 MG tablet   Oral   Take 81 mg by mouth daily.         . cetirizine (ZYRTEC) 10 MG tablet   Oral   Take 10 mg by mouth daily.         . Cholecalciferol (VITAMIN D) 1000 UNITS capsule   Oral   Take 1,000 Units by mouth daily.         [name] desonide (DESOWEN) 0.05 % cream   Topical   Apply 1 application topically 2 (two) times daily.         [name] Dexamethasone 0.7 MG IMPL   Both Eyes   Place 1 each into both eyes every 6 (six) months.         . dorzolamide-timolol (COSOPT) 22.3-6.8 MG/ML ophthalmic solution   Both Eyes   Place 1 drop into both eyes 2 (two) times daily.         . Glucosamine-Chondroit-Vit C-Mn (GLUCOSAMINE 1500 COMPLEX PO)   Oral   Take 1 capsule by mouth daily.         [name] levothyroxine (SYNTHROID, LEVOTHROID) 200 MCG tablet   Oral   Take 200 mcg by mouth daily.          . metroNIDAZOLE (METROGEL) 1 % gel   Topical   Apply 1 application topically daily.         . Multiple Vitamin (MULTIVITAMIN WITH MINERALS) TABS   Oral   Take 1 tablet by mouth daily.         [name] omega-3 acid ethyl esters (LOVAZA) 1 G capsule   Oral   Take 2 g by mouth 2 (two) times daily.         . pantoprazole (PROTONIX) 40 MG tablet   Oral   Take 40 mg by mouth daily.         . polyethylene glycol (MIRALAX / GLYCOLAX) packet   Oral   Take 17 g by mouth daily.         . predniSONE (DELTASONE) 5 MG tablet   Oral   Take 5 mg by mouth daily.         . sertraline (ZOLOFT) 100 MG tablet   Oral   Take 100 mg by mouth 2 (two) times daily.         . simvastatin (ZOCOR) 40 MG tablet   Oral   Take 40 mg by mouth every evening.         . triamcinolone cream (KENALOG) 0.1 %   Topical   Apply topically 2 (two) times daily.         . Zinc 30 MG CAPS   Oral   Take 1 capsule by mouth daily.         [name] zolpidem (AMBIEN) 10 MG tablet   Oral   Take 10 mg by mouth at bedtime as needed. For sleep         . bisoprolol (ZEBETA) 5 MG tablet   Oral   Take 2 tablets (10 mg total) by mouth daily.         . diazepam (VALIUM) 5 MG tablet   Oral   Take 1 tablet (5 mg total) by mouth every 12 (twelve) hours as needed for anxiety. For anxiety   30 tablet   0   . docusate sodium 100 MG CAPS   Oral   Take 100 mg by mouth 2 (two) times  daily.   10 capsule      . feeding supplement (ENSURE COMPLETE) LIQD   Oral   Take 237 mLs by mouth 3 (three) times daily between meals.         . ferrous sulfate 325 (65 FE) MG tablet   Oral   Take 1 tablet (325 mg total) by mouth 3 (three) times daily after meals.         [name] glucosamine-chondroitin 500-400 MG tablet   Oral   Take 1 tablet by mouth daily.         . meclizine (ANTIVERT) 25 MG tablet   Oral   Take 25 mg by mouth every 12 (twelve) hours as needed.         [name] oxyCODONE (ROXICODONE) 15 MG  immediate release tablet   Oral   Take 15 mg by mouth every 4 (four) hours as needed for pain.         . Rivaroxaban (XARELTO) 20 MG TABS   Oral   Take 1 tablet (20 mg total) by mouth daily before supper.   30 tablet   0     BP 150/87  Pulse 55  Temp(Src) 98.1 F (36.7 C) (Oral)  Resp 20  Ht 6' 0.84" (1.85 m)  Wt 181 lb (82.1 kg)  BMI 23.99 kg/m2  SpO2 95%  Physical Exam  Constitutional: He appears well-developed and well-nourished.  HENT:  Head: Normocephalic and atraumatic.  Eyes: Conjunctivae are normal. Pupils are equal, round, and reactive to light.  Neck: Normal range of motion. Neck supple.  Cardiovascular: Normal rate, regular rhythm, normal heart sounds and intact distal pulses.   Pulmonary/Chest: Effort normal and breath sounds normal.  Abdominal: Soft. Bowel sounds are normal.  Musculoskeletal: He exhibits tenderness.  Left hip  Neurological: He is alert.  Skin: Skin is warm and dry.  Psychiatric: He has a normal mood and affect. His behavior is normal. Judgment and thought content normal.    ED Course  Procedures (including critical care time)  Labs Reviewed  SURGICAL PCR SCREEN - Abnormal; Notable for the following:    Staphylococcus aureus POSITIVE (*)    All other components within normal limits  CBC - Abnormal; Notable for the following:    WBC 10.6 (*)    RBC 3.70 (*)    Hemoglobin 11.8 (*)    HCT 35.0 (*)    Platelets 134 (*)    All other components within normal limits  COMPREHENSIVE METABOLIC PANEL - Abnormal; Notable for the following:    Potassium 3.4 (*)    Glucose, Bld 110 (*)    BUN 29 (*)    Creatinine, Ser 1.74 (*)    Albumin 2.6 (*)    GFR calc non Af Amer 38 (*)    GFR calc Af Amer 44 (*)    All other components within normal limits  URINALYSIS, ROUTINE W REFLEX MICROSCOPIC - Abnormal; Notable for the following:    Color, Urine AMBER (*)    Bilirubin Urine SMALL (*)    All other components within normal limits  BASIC  METABOLIC PANEL - Abnormal; Notable for the following:    BUN 25 (*)    Creatinine, Ser 1.39 (*)    GFR calc non Af Amer 49 (*)    GFR calc Af Amer 57 (*)    All other components within normal limits  CBC WITH DIFFERENTIAL - Abnormal; Notable for the following:    RBC 3.38 (*)    Hemoglobin  10.5 (*)    HCT 32.1 (*)    Platelets 123 (*)    All other components within normal limits  CBC - Abnormal; Notable for the following:    RBC 3.35 (*)    Hemoglobin 10.5 (*)    HCT 31.3 (*)    Platelets 139 (*)    All other components within normal limits  BASIC METABOLIC PANEL - Abnormal; Notable for the following:    GFR calc non Af Amer 55 (*)    GFR calc Af Amer 63 (*)    All other components within normal limits  CBC - Abnormal; Notable for the following:    RBC 3.43 (*)    Hemoglobin 10.8 (*)    HCT 32.0 (*)    All other components within normal limits  BASIC METABOLIC PANEL - Abnormal; Notable for the following:    GFR calc non Af Amer 53 (*)    GFR calc Af Amer 61 (*)    All other components within normal limits  CBC - Abnormal; Notable for the following:    RBC 3.32 (*)    Hemoglobin 10.4 (*)    HCT 31.1 (*)    All other components within normal limits  BASIC METABOLIC PANEL - Abnormal; Notable for the following:    GFR calc non Af Amer 56 (*)    GFR calc Af Amer 65 (*)    All other components within normal limits  URINE CULTURE  CULTURE, BLOOD (ROUTINE X 2)  CULTURE, BLOOD (ROUTINE X 2)  CULTURE, EXPECTORATED SPUTUM-ASSESSMENT  GRAM STAIN  INFLUENZA PANEL BY PCR  LEGIONELLA ANTIGEN, URINE  STREP PNEUMONIAE URINARY ANTIGEN  TROPONIN I  TROPONIN I  TROPONIN I  TSH  TYPE AND SCREEN  ABO/RH   No results found.   1. Acute nontransmural MI < 8 weeks prior, subsequent admit after initial   2. Hip fracture   3. Fever   4. Altered mental status   5. CAD (coronary artery disease)   6. Chronic kidney disease   7. Confusion state   8. Hip fracture, left   9. Hypoxia    10. PNA (pneumonia)   11. GERD (gastroesophageal reflux disease)   12. Hypertension   13. Hypothyroid   14. Arthritis   15. Sleep apnea   16. Thyroid disease   17. PAF (paroxysmal atrial fibrillation)   18. 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 Coronary artery disease   20. Anxiety   21. Gout   22. Skin cancer (removed on back, neck, hands, forearms last 11/22/12   23. Anemia   24. Hepatitis   25. Chronic back pain   26. Dyslipidemia   27. Chronic anticoagulation       MDM           Rosanne Ashing, MD 01/24/13 [phone]

## 2013-02-05 ENCOUNTER — Encounter: Payer: Self-pay | Admitting: *Deleted

## 2013-02-06 ENCOUNTER — Encounter: Payer: Self-pay | Admitting: Internal Medicine

## 2013-02-08 ENCOUNTER — Ambulatory Visit: Payer: Medicare Other | Admitting: Internal Medicine

## 2013-02-27 ENCOUNTER — Encounter: Payer: Self-pay | Admitting: *Deleted

## 2013-02-27 ENCOUNTER — Other Ambulatory Visit: Payer: Self-pay | Admitting: Radiation Oncology

## 2013-02-27 ENCOUNTER — Telehealth: Payer: Self-pay | Admitting: *Deleted

## 2013-02-27 DIAGNOSIS — C049 Malignant neoplasm of floor of mouth, unspecified: Secondary | ICD-10-CM

## 2013-02-27 NOTE — Progress Notes (Signed)
Head and Neck Cancer Location of Tumor / Histology: right retromolar trigone  Patient presented June 2012 months ago with symptoms of: growth in back of right mouth  Biopsies of right retromolar trigone (if applicable) revealed: squamous cell carcinoma, 10/01/12; radical right neck dissection- inv squamous cell ca, 1/21 nodes pos. 01/22/13  Nutrition Status:  Weight changes: Jan 2014 185 lb, Mar 2014 168 lbs  Swallowing status: soft foods  Plans, if any, for PEG tube: no, advance diet as tol  Tobacco/Marijuana/Snuff/ETOH use: quit smoking 1999, 40 pk yrs  Past/Anticipated interventions by otolaryngology, if any: total radical neck dissection 01/22/13, Dr Hezzie Bump, FU visit 3-4 wks  Past/Anticipated interventions by medical oncology, if any: no appt scheduled  Referrals yet, to any of the following?  Social Work? no  Dentistry? no  Swallowing therapy? no  Nutrition? no  Med/Onc? no  PEG placement? no  SAFETY ISSUES:  Prior radiation? no  Pacemaker/ICD? no  Possible current pregnancy? no  Is the patient on methotrexate? no  Current Complaints / other details:

## 2013-02-27 NOTE — Telephone Encounter (Signed)
CALLED PATIENT TO ASK IF HE COULD COME EARLY FOR LABS PER DR. Michell Heinrich, LVM FOR A RETURN CALL

## 2013-02-28 ENCOUNTER — Encounter: Payer: Self-pay | Admitting: Radiation Oncology

## 2013-02-28 ENCOUNTER — Ambulatory Visit
Admission: RE | Admit: 2013-02-28 | Discharge: 2013-02-28 | Disposition: A | Discharge status: Home / Self Care | Payer: Medicare Other | Source: Ambulatory Visit | Attending: Radiation Oncology | Admitting: Radiation Oncology

## 2013-02-28 VITALS — BP 110/67 | HR 57 | Temp 97.9°F | Resp 20 | Wt 167.3 lb

## 2013-02-28 DIAGNOSIS — C049 Malignant neoplasm of floor of mouth, unspecified: Secondary | ICD-10-CM

## 2013-02-28 DIAGNOSIS — C062 Malignant neoplasm of retromolar area: Secondary | ICD-10-CM | POA: Insufficient documentation

## 2013-02-28 DIAGNOSIS — K121 Other forms of stomatitis: Secondary | ICD-10-CM | POA: Insufficient documentation

## 2013-02-28 DIAGNOSIS — Z510 Encounter for antineoplastic radiation therapy: Secondary | ICD-10-CM | POA: Insufficient documentation

## 2013-02-28 DIAGNOSIS — Z79899 Other long term (current) drug therapy: Secondary | ICD-10-CM | POA: Insufficient documentation

## 2013-02-28 LAB — BUN AND CREATININE (CC13): BUN: 11.5 mg/dL (ref 7.0–26.0)

## 2013-02-28 MED ORDER — SODIUM CHLORIDE 0.9 % IJ SOLN
10.0000 mL | Freq: Once | INTRAMUSCULAR | Status: AC
  Administered 2013-02-28: 10 mL via INTRAVENOUS

## 2013-02-28 NOTE — Progress Notes (Signed)
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   Interval History: Gary [name] presents today for routine followup.  He at [facility] on March 18. He was found to have an invasive squamous cell carcinoma. His neck dissection revealed one of 21 lymph nodes positive in the level XXIII and 4 dissection. No extracapsular extension was noted. His submandibular lymph nodes were negative as well as the submental lymph nodes. The tumor measured 2.5 cm and was close to a deep margin at 0.1 cm. He unfortunately broke his hip prior to surgery. He is edentulous. His dentures are not fitting correctly. He still continues to care for his wife who is of course recovering from her chemotherapy as well. This case was presented at [facility] tumor board and recommendation was made for adjuvant radiation.  Allergies:  Allergies  Allergen Reactions  . Cellcept (Mycophenolate Mofetil) Other (See Comments)    Reaction unknown  . Colcrys (Colchicine) Other (See Comments)    myalgias  . Erythromycin Other (See Comments)    Reaction unknown  . Methotrexate Derivatives Hives, Itching and Rash    Only in tablet form  . Neosporin (Neomycin-Bacitracin Zn-Polymyx) Rash  . Ultram (Tramadol) Rash    Medications:  Current Outpatient Prescriptions  Medication Sig Dispense Refill  . aspirin EC 81 MG tablet Take 81 mg by mouth daily.      . bisoprolol (ZEBETA) 5 MG tablet Take 2 tablets (10 mg total) by mouth daily.      . cetirizine (ZYRTEC) 10 MG tablet Take 10 mg by mouth daily.      . Cholecalciferol (VITAMIN D) 1000 UNITS capsule Take 1,000 Units by mouth daily.      [name] desonide (DESOWEN) 0.05 % cream Apply 1 application topically 2 (two) times daily.      [name] Dexamethasone 0.7 MG IMPL Place 1 each into  both eyes every 6 (six) months.      . diazepam (VALIUM) 5 MG tablet Take 1 tablet (5 mg total) by mouth every 12 (twelve) hours as needed for anxiety. For anxiety  30 tablet  0  . docusate sodium 100 MG CAPS Take 100 mg by mouth 2 (two) times daily.  10 capsule    . dorzolamide-timolol (COSOPT) 22.3-6.8 MG/ML ophthalmic solution Place 1 drop into both eyes 2 (two) times daily.      . feeding supplement (ENSURE COMPLETE) LIQD Take 237 mLs by mouth 3 (three) times daily between meals.      . ferrous sulfate 325 (65 FE) MG tablet Take 1 tablet (325 mg total) by mouth 3 (three) times daily after meals.      . Glucosamine-Chondroit-Vit C-Mn (GLUCOSAMINE 1500 COMPLEX PO) Take 1 capsule by mouth daily.      [name] glucosamine-chondroitin 500-400 MG tablet Take 1 tablet by mouth daily.      [name] levothyroxine (SYNTHROID, LEVOTHROID) 200 MCG tablet Take 200 mcg by mouth daily.      . meclizine (ANTIVERT) 25 MG tablet Take 25 mg by mouth every 12 (twelve) hours as needed.      . metroNIDAZOLE (METROGEL) 1 % gel Apply 1 application topically daily.      . Multiple Vitamin (MULTIVITAMIN WITH MINERALS) TABS Take 1 tablet by mouth daily.      [name]  omega-3 acid ethyl esters (LOVAZA) 1 G capsule Take 2 g by mouth 2 (two) times daily.      [name] oxyCODONE (ROXICODONE) 15 MG immediate release tablet Take 15 mg by mouth every 4 (four) hours as needed for pain.      . pantoprazole (PROTONIX) 40 MG tablet Take 40 mg by mouth daily.      . polyethylene glycol (MIRALAX / GLYCOLAX) packet Take 17 g by mouth daily.      . predniSONE (DELTASONE) 5 MG tablet Take 5 mg by mouth daily.      . Rivaroxaban (XARELTO) 20 MG TABS Take 1 tablet (20 mg total) by mouth daily before supper.  30 tablet  0  . sertraline (ZOLOFT) 100 MG tablet Take 100 mg by mouth 2 (two) times daily.      . simvastatin (ZOCOR) 40 MG tablet Take 40 mg by mouth every evening.      . triamcinolone cream (KENALOG) 0.1 % Apply topically 2 (two) times daily.      . Zinc  30 MG CAPS Take 1 capsule by mouth daily.      [name] zolpidem (AMBIEN) 10 MG tablet Take 10 mg by mouth at bedtime as needed. For sleep       No current facility-administered medications for this encounter.    Physical Exam:  Filed Vitals:   02/28/13 0848  BP: 110/67  Pulse: 57  Temp: 97.9 F (36.6 C)  Resp: 20   is a pleasant male in no distress sitting comfortably on examining table. His primary incision and his mouth is well-healed. He has some minimal swelling along his right neck dissection scar. He has some trismus.  IMPRESSION: Gary [name] is a 72 y.o. male status post transanal resection of a retromolar trigone lesion which was node positive with negative margins and no extracapsular extension  PLAN:  Gary [name] would benefit from adjuvant radiation. We discussed the process of simulation the making a mask. We discussed 30 treatments as an outpatient. I will dose paint to increased as per fraction and this area of close margin. I plan on treating his bilateral neck. We've gone over the side effects of treatment including sore throat weight loss and difficulty swallowing. I will refer him to speech pathology dietary and social worker. He was amenable to simulation today which we have scheduled. He will not require chemotherapy and therefore cannot place a feeding tube. I encouraged him to continue pushing fluids and Ensure.    Lurline Hare, MD

## 2013-02-28 NOTE — Progress Notes (Signed)
Patient gave name dob as identification, IV started RUA x 1 attempt, #22g 1 inch, excellent blood return, flushed with 10cc ns, BUN=11.5,Cr-1.5, Dr. Michell Heinrich aware ok for IV contrast, called and spoke with Katie,therapist,patient is ready in room 9 9:42 AM

## 2013-02-28 NOTE — Progress Notes (Signed)
Removed the IV from Gary [name]'s Right forearm.  The IV was removed intact.  Pressure was held and band aid applied.

## 2013-02-28 NOTE — Progress Notes (Signed)
[facility] Radiation Oncology Complex Simulation/Treatment Planning/IMRT note   PHILO KURTZ  [id number] 02/28/2013   1941/08/02  Squamous Cell carcinoma of the right retromolar trigone.   CONSENT VERIFIED:yes   SET UP: Patient is set-up supine   IMMOBILIZATION: The following immobilization is used:Aquaplast Mask   NARRATIVE:The patient was brought to the CT Simulation planning suite.  Identity was confirmed.  All relevant records and images related to the planned course of therapy were reviewed.  Then, the patient was positioned in a stable reproducible clinical set-up for radiation therapy using an aquaplast mask.  IV contrast was administered and CT images were obtained.  Skin markings were placed.  The CT images were loaded into the planning software where the target and avoidance structures were contoured.  The patient's previous PET scan was fused with the planning CT to aid in target delineation. The radiation prescription was entered and confirmed.   TREATMENT PLANNING NOTE:  Treatment planning then occurred. I have requested : Intensity Modulated Radiotherapy (IMRT) is medically necessary for this case for the following reason:  Dose homogeneity and treatment of a head and neck site.

## 2013-02-28 NOTE — Addendum Note (Signed)
Encounter addended by: Lowella Petties, RN on: 02/28/2013  9:46 AM<BR>     Documentation filed: Orders, Inpatient MAR

## 2013-02-28 NOTE — Progress Notes (Signed)
He reports fatigue, loss of appetite but "makes himself eat", c/o right-sided mouth soreness "like a toothache". He takes Oxycodone prn for this pain as well as for chronic back pain; he states it relieves his mouth pain 100% at times, takes up to 4 tabs a day. Pt eating soft foods, drinks Ensure 3 cans daily. Right side of pt's neck/face slightly swollen today. Pt was on antibiotics in hospital but not after d/c.

## 2013-02-28 NOTE — Progress Notes (Signed)
Met with patient today to discuss RO billing.  Patient had concerns about transportation since he is recovering from hip surgery and his wife has just finished chemo and is very weak.  We discussed MCD/EPP, but patient does not feel he would qualify based on his income.  Gave patient SCAT application as well as ACS application.  Patient will think over and fill out if he decides he is not able to drive himself.  Advised patient to call me should he need help in filling out the forms.  Will also call social workers just to see if they know of any other possible resources.

## 2013-02-28 NOTE — Addendum Note (Signed)
Encounter addended by: Lowella Petties, RN on: 02/28/2013  9:42 AM<BR>     Documentation filed: Notes Section

## 2013-02-28 NOTE — Addendum Note (Signed)
Encounter addended by: Eduardo Osier, RN on: 02/28/2013 10:34 AM<BR>     Documentation filed: Notes Section

## 2013-03-05 ENCOUNTER — Encounter: Payer: Self-pay | Admitting: Radiation Oncology

## 2013-03-05 NOTE — Progress Notes (Signed)
Email communication with Dr. Hezzie Bump indicates that deep margin in path report labeled neck dissection is actually from tonsil so margins are clear but close. Close margin is where they had to drill off and peel off the bone.  Will target this area with 63 Gy and treat rest of neck/ op bed to 60 Gy. At risk nodes to 54 Gy. No indication for chemo.

## 2013-03-11 ENCOUNTER — Ambulatory Visit
Admission: RE | Admit: 2013-03-11 | Discharge: 2013-03-11 | Disposition: A | Discharge status: Home / Self Care | Payer: Medicare Other | Source: Ambulatory Visit | Attending: Radiation Oncology | Admitting: Radiation Oncology

## 2013-03-11 ENCOUNTER — Ambulatory Visit: Payer: Medicare Other

## 2013-03-11 ENCOUNTER — Ambulatory Visit: Payer: Medicare Other | Attending: Radiation Oncology

## 2013-03-11 DIAGNOSIS — R1310 Dysphagia, unspecified: Secondary | ICD-10-CM | POA: Insufficient documentation

## 2013-03-11 DIAGNOSIS — C062 Malignant neoplasm of retromolar area: Secondary | ICD-10-CM

## 2013-03-11 DIAGNOSIS — Z923 Personal history of irradiation: Secondary | ICD-10-CM

## 2013-03-11 DIAGNOSIS — IMO0001 Reserved for inherently not codable concepts without codable children: Secondary | ICD-10-CM | POA: Insufficient documentation

## 2013-03-11 HISTORY — DX: Personal history of irradiation: Z92.3

## 2013-03-11 MED ORDER — BIAFINE EX EMUL
CUTANEOUS | Status: DC | PRN
  Administered 2013-03-11: 13:00:00 via TOPICAL

## 2013-03-11 NOTE — Progress Notes (Signed)
patioent education done, radiation therapy and you book, biafine cream, calendar, business card given to patient,discussed side effects,hiar loss, fatigue, skin irritation, nausea,vomiting, throat , discussed pain, diet, keeping hydrated, can call for any questions,concerns, biafine to appy to neck bid, after rad tx and bedtime

## 2013-03-12 ENCOUNTER — Ambulatory Visit
Admission: RE | Admit: 2013-03-12 | Discharge: 2013-03-12 | Disposition: A | Discharge status: Home / Self Care | Payer: Medicare Other | Source: Ambulatory Visit | Attending: Radiation Oncology | Admitting: Radiation Oncology

## 2013-03-12 VITALS — BP 113/79 | HR 52 | Temp 98.4°F | Ht 71.0 in | Wt 162.0 lb

## 2013-03-12 DIAGNOSIS — C062 Malignant neoplasm of retromolar area: Secondary | ICD-10-CM

## 2013-03-12 NOTE — Progress Notes (Signed)
Weekly Management Note Current Dose:4 Gy  Projected Dose: 60 Gy   Narrative:  The patient presents for routine under treatment assessment.  CBCT/MVCT images/Port film x-rays were reviewed.  The chart was checked. Doing well. Would like me to look at an area where he can feel a stich at the back of his mouth. Headaches at night behind right eye (feels like a sinus headache). Responds to tylenol.  Physical Findings:  No skin changes. In posterior right gumline there is a 5-8 mm opening with a stitch protruding.  The tissue around this area is well healed. There is no evidence of disease recurrence.   Vitals:  Filed Vitals:   03/12/13 1229  BP: 113/79  Pulse: 52  Temp: 98.4 F (36.9 C)   Weight:  Wt Readings from Last 3 Encounters:  03/12/13 162 lb (73.483 kg)  01/18/13 176 lb (79.833 kg)  01/02/13 175 lb (79.379 kg)   Lab Results  Component Value Date   WBC 8.1 12/25/2012   HGB 10.4* 12/25/2012   HCT 31.1* 12/25/2012   MCV 93.7 12/25/2012   PLT 173 12/25/2012   Lab Results  Component Value Date   CREATININE 1.5* 02/28/2013   BUN 11.5 02/28/2013   NA 144 12/25/2012   K 4.3 12/25/2012   CL 109 12/25/2012   CO2 27 12/25/2012     Impression:  The patient is tolerating radiation.  Plan:  Continue treatment as planned. Continue biafene.  Will make surgeon aware of stitch but continue treatment.

## 2013-03-12 NOTE — Progress Notes (Addendum)
Gary [name] here for weekly under treat visit.  He has had 1/30 fractions to his right neck.   He denies pain and nausea.  He does have fatigue.  He is using biafine cream twice a day on the skin on his right neck.  The skin is slightly pink.  He also states that it feels like there is a stitch in his left check.  He states it is below his teeth on the right side.

## 2013-03-13 ENCOUNTER — Ambulatory Visit
Admission: RE | Admit: 2013-03-13 | Discharge: 2013-03-13 | Disposition: A | Discharge status: Home / Self Care | Payer: Medicare Other | Source: Ambulatory Visit | Attending: Radiation Oncology | Admitting: Radiation Oncology

## 2013-03-14 ENCOUNTER — Ambulatory Visit
Admission: RE | Admit: 2013-03-14 | Discharge: 2013-03-14 | Disposition: A | Discharge status: Home / Self Care | Payer: Medicare Other | Source: Ambulatory Visit | Attending: Radiation Oncology | Admitting: Radiation Oncology

## 2013-03-15 ENCOUNTER — Ambulatory Visit
Admission: RE | Admit: 2013-03-15 | Discharge: 2013-03-15 | Disposition: A | Discharge status: Home / Self Care | Payer: Medicare Other | Source: Ambulatory Visit | Attending: Radiation Oncology | Admitting: Radiation Oncology

## 2013-03-16 ENCOUNTER — Ambulatory Visit
Admission: RE | Admit: 2013-03-16 | Discharge: 2013-03-16 | Disposition: A | Discharge status: Home / Self Care | Payer: Medicare Other | Source: Ambulatory Visit | Attending: Radiation Oncology | Admitting: Radiation Oncology

## 2013-03-18 ENCOUNTER — Ambulatory Visit
Admission: RE | Admit: 2013-03-18 | Discharge: 2013-03-18 | Disposition: A | Discharge status: Home / Self Care | Payer: Medicare Other | Source: Ambulatory Visit | Attending: Radiation Oncology | Admitting: Radiation Oncology

## 2013-03-19 ENCOUNTER — Encounter: Payer: Medicare Other | Admitting: Nutrition

## 2013-03-19 ENCOUNTER — Ambulatory Visit
Admission: RE | Admit: 2013-03-19 | Discharge: 2013-03-19 | Disposition: A | Discharge status: Home / Self Care | Payer: Medicare Other | Source: Ambulatory Visit | Attending: Radiation Oncology | Admitting: Radiation Oncology

## 2013-03-19 DIAGNOSIS — C062 Malignant neoplasm of retromolar area: Secondary | ICD-10-CM

## 2013-03-19 NOTE — Progress Notes (Addendum)
Weekly rad txs, r neck, 7/30 tx completes, alert,otiented x3, poor appetite over the weekend, but better past 2 days, will rtart back jaw exercises this week, has been tongue depressors in mouth for exercises, , neck slsight pink, using biafine cream, had eggs and gerits today, milk and yogurt,generally eats 2 meals daily, sees b.neff tomorrow, no c/o pain, slight fatigue, takes pain med and tylenol before coming for tx, and states that  helps 2:46 PM

## 2013-03-19 NOTE — Progress Notes (Signed)
Weekly Management Note Current Dose: 14  Gy  Projected Dose: 60 Gy   Narrative:  The patient presents for routine under treatment assessment.  CBCT/MVCT images/Port film x-rays were reviewed.  The chart was checked. Minimal pain. Controlled with tylenol and pain pill before treatment. [place] removed piece of bone that I had thought was a stitch in posterior suture line. Worried about caring for wife and finances. Keeps water with him constantly.   Physical Findings:  Wt Readings from Last 3 Encounters:  03/12/13 162 lb (73.483 kg)  01/18/13 176 lb (79.833 kg)  01/02/13 175 lb (79.379 kg)   Impression:  The patient is tolerating radiation.  Plan:  Continue treatment as planned.Meeting with financial advocate and will refer to SW for resources and support while caring for wife and underoing RT. Pain controlled continue current meds.

## 2013-03-20 ENCOUNTER — Ambulatory Visit: Payer: Medicare Other | Admitting: Nutrition

## 2013-03-20 ENCOUNTER — Ambulatory Visit
Admission: RE | Admit: 2013-03-20 | Discharge: 2013-03-20 | Disposition: A | Discharge status: Home / Self Care | Payer: Medicare Other | Source: Ambulatory Visit | Attending: Radiation Oncology | Admitting: Radiation Oncology

## 2013-03-20 NOTE — Progress Notes (Signed)
This is a 72 year old male patient of Dr. Michell Heinrich diagnosed with right retromolar trigone.  Past medical history includes CAD, hypertension, thyroid disease, anxiety, chronic kidney disease, GERD, gout, anxiety, and dyslipidemia.  Medications include Xanax, vitamin D, Synthroid, multivitamin, lovaza, Protonix, and MiraLax.  Labs include creatinine 1.5 on April 24.  Height: 5 feet 11 inches. Weight: 162 pounds May 6. Usual body weight: 185 pounds January 2014. BMI: 22.6.  Patient reports he had difficulty eating last week secondary to a headache after radiation therapy. His pain is improved this week. He denies nausea. He states he has a good appetite. He does have difficulty chewing secondary to his dentures not fitting however patient is eating soft foods without difficulty. He drinks one boost oral nutrition supplement daily. He does have some constipation however it is controlled with MiraLax.  Nutrition diagnosis: Unintended weight loss related to new diagnosis of cancer and associated treatments as evidenced by 12% weight loss over the past 4 months.  Intervention: I educated patient on the importance of choosing higher calorie, high protein, soft, moist foods frequently throughout the day. I have reviewed strategies for eating to improve constipation. I have recommended patient increase boost  Plus to twice a day to 3 times daily. I've answered his questions. Teach back method used.  Monitoring, evaluation, goals: Patient will tolerate increased calories and protein with boost Plus 3 times a day to minimize further weight loss.  Next visit: Thursday, May 22.

## 2013-03-21 ENCOUNTER — Ambulatory Visit
Admission: RE | Admit: 2013-03-21 | Discharge: 2013-03-21 | Disposition: A | Discharge status: Home / Self Care | Payer: Medicare Other | Source: Ambulatory Visit | Attending: Radiation Oncology | Admitting: Radiation Oncology

## 2013-03-22 ENCOUNTER — Ambulatory Visit
Admission: RE | Admit: 2013-03-22 | Discharge: 2013-03-22 | Disposition: A | Discharge status: Home / Self Care | Payer: Medicare Other | Source: Ambulatory Visit | Attending: Radiation Oncology | Admitting: Radiation Oncology

## 2013-03-22 ENCOUNTER — Encounter: Payer: Self-pay | Admitting: Radiation Oncology

## 2013-03-22 VITALS — BP 122/66 | HR 68 | Resp 20 | Wt 163.4 lb

## 2013-03-22 DIAGNOSIS — C062 Malignant neoplasm of retromolar area: Secondary | ICD-10-CM

## 2013-03-22 MED ORDER — MAGIC MOUTHWASH W/LIDOCAINE: 10.0000 mL | Freq: Three times a day (TID) | ORAL | Status: DC

## 2013-03-22 MED ORDER — HYDROCODONE-ACETAMINOPHEN 7.5-325 MG/15ML PO SOLN
15.0000 mL | Freq: Four times a day (QID) | ORAL | Status: DC | PRN
Start: 2013-03-22 — End: 2013-03-28

## 2013-03-22 NOTE — Progress Notes (Addendum)
pateint here  Rad tx to neck, 10/30 so far, c/o sore throat, just started, hard to swallow capsule last night, eating softer foods, boost cans x 3daily ,using biafine on neck, slight pink, takes Oxycodone 15mg  2-3 x day prn pain,  Vitals wnl, 95% room air, no c/o nausea or sob, 2:39 PM Did see B.Neff 03/20/13

## 2013-03-22 NOTE — Progress Notes (Signed)
Weekly Management Note Current Dose: 20 Gy  Projected Dose:60 Gy   Narrative:  The patient presents for routine under treatment assessment.  CBCT/MVCT images/Port film x-rays were reviewed.  The chart was checked. Had problems swallowing his tylenol and was concerned he needed a feeding tube. Still eating and drinking well. Swallowed rest of pills fine. Using wife's magic mouth wash. Only taking 1 pain pill per day.   Physical Findings:  Right neck skin slightly pink.   Vitals:  Filed Vitals:   03/22/13 1440  BP: 122/66  Pulse: 68  Resp: 20   Weight:  Wt Readings from Last 3 Encounters:  03/22/13 163 lb 6.4 oz (74.118 kg)  03/12/13 162 lb (73.483 kg)  01/18/13 176 lb (79.833 kg)   Lab Results  Component Value Date   WBC 8.1 12/25/2012   HGB 10.4* 12/25/2012   HCT 31.1* 12/25/2012   MCV 93.7 12/25/2012   PLT 173 12/25/2012   Lab Results  Component Value Date   CREATININE 1.5* 02/28/2013   BUN 11.5 02/28/2013   NA 144 12/25/2012   K 4.3 12/25/2012   CL 109 12/25/2012   CO2 27 12/25/2012     Impression:  The patient is tolerating radiation.  Plan:  Continue treatment as planned. Gave script for MMW with lidocaine and hycet. Encouraged to drink plenty of water and use pain meds as needed. Weight looks good for now. No need for feeding tube and hopefully we will avoid this altogether.

## 2013-03-25 ENCOUNTER — Ambulatory Visit
Admission: RE | Admit: 2013-03-25 | Discharge: 2013-03-25 | Disposition: A | Discharge status: Home / Self Care | Payer: Medicare Other | Source: Ambulatory Visit | Attending: Radiation Oncology | Admitting: Radiation Oncology

## 2013-03-26 ENCOUNTER — Emergency Department (HOSPITAL_COMMUNITY): Payer: Medicare Other

## 2013-03-26 ENCOUNTER — Ambulatory Visit: Payer: Medicare Other

## 2013-03-26 ENCOUNTER — Inpatient Hospital Stay (HOSPITAL_COMMUNITY)
Admission: EM | Admit: 2013-03-26 | Discharge: 2013-03-28 | DRG: 190 | Disposition: A | Discharge status: Home / Self Care | Payer: Medicare Other | Attending: Family Medicine | Admitting: Family Medicine

## 2013-03-26 ENCOUNTER — Encounter (HOSPITAL_COMMUNITY): Payer: Self-pay | Admitting: Emergency Medicine

## 2013-03-26 DIAGNOSIS — F411 Generalized anxiety disorder: Secondary | ICD-10-CM

## 2013-03-26 DIAGNOSIS — Z9861 Coronary angioplasty status: Secondary | ICD-10-CM

## 2013-03-26 DIAGNOSIS — G8929 Other chronic pain: Secondary | ICD-10-CM | POA: Diagnosis present

## 2013-03-26 DIAGNOSIS — J189 Pneumonia, unspecified organism: Secondary | ICD-10-CM | POA: Diagnosis present

## 2013-03-26 DIAGNOSIS — I4891 Unspecified atrial fibrillation: Secondary | ICD-10-CM | POA: Diagnosis present

## 2013-03-26 DIAGNOSIS — I129 Hypertensive chronic kidney disease with stage 1 through stage 4 chronic kidney disease, or unspecified chronic kidney disease: Secondary | ICD-10-CM | POA: Diagnosis present

## 2013-03-26 DIAGNOSIS — F419 Anxiety disorder, unspecified: Secondary | ICD-10-CM

## 2013-03-26 DIAGNOSIS — C069 Malignant neoplasm of mouth, unspecified: Secondary | ICD-10-CM | POA: Diagnosis present

## 2013-03-26 DIAGNOSIS — Z87891 Personal history of nicotine dependence: Secondary | ICD-10-CM

## 2013-03-26 DIAGNOSIS — I480 Paroxysmal atrial fibrillation: Secondary | ICD-10-CM

## 2013-03-26 DIAGNOSIS — I5189 Other ill-defined heart diseases: Secondary | ICD-10-CM

## 2013-03-26 DIAGNOSIS — N189 Chronic kidney disease, unspecified: Secondary | ICD-10-CM

## 2013-03-26 DIAGNOSIS — I2510 Atherosclerotic heart disease of native coronary artery without angina pectoris: Secondary | ICD-10-CM

## 2013-03-26 DIAGNOSIS — N182 Chronic kidney disease, stage 2 (mild): Secondary | ICD-10-CM | POA: Diagnosis present

## 2013-03-26 DIAGNOSIS — J9601 Acute respiratory failure with hypoxia: Secondary | ICD-10-CM

## 2013-03-26 DIAGNOSIS — E039 Hypothyroidism, unspecified: Secondary | ICD-10-CM

## 2013-03-26 DIAGNOSIS — C062 Malignant neoplasm of retromolar area: Secondary | ICD-10-CM

## 2013-03-26 DIAGNOSIS — M549 Dorsalgia, unspecified: Secondary | ICD-10-CM | POA: Diagnosis present

## 2013-03-26 DIAGNOSIS — E079 Disorder of thyroid, unspecified: Secondary | ICD-10-CM | POA: Diagnosis present

## 2013-03-26 DIAGNOSIS — Z7901 Long term (current) use of anticoagulants: Secondary | ICD-10-CM

## 2013-03-26 DIAGNOSIS — D649 Anemia, unspecified: Secondary | ICD-10-CM

## 2013-03-26 DIAGNOSIS — E785 Hyperlipidemia, unspecified: Secondary | ICD-10-CM | POA: Diagnosis present

## 2013-03-26 DIAGNOSIS — R0902 Hypoxemia: Secondary | ICD-10-CM | POA: Diagnosis present

## 2013-03-26 DIAGNOSIS — K219 Gastro-esophageal reflux disease without esophagitis: Secondary | ICD-10-CM | POA: Diagnosis present

## 2013-03-26 DIAGNOSIS — K759 Inflammatory liver disease, unspecified: Secondary | ICD-10-CM

## 2013-03-26 DIAGNOSIS — J441 Chronic obstructive pulmonary disease with (acute) exacerbation: Principal | ICD-10-CM

## 2013-03-26 DIAGNOSIS — G4730 Sleep apnea, unspecified: Secondary | ICD-10-CM | POA: Diagnosis present

## 2013-03-26 DIAGNOSIS — M109 Gout, unspecified: Secondary | ICD-10-CM

## 2013-03-26 LAB — BLOOD GAS, ARTERIAL
Drawn by: 257701
O2 Content: 3 L/min
Patient temperature: 98.6
TCO2: 22.1 mmol/L (ref 0–100)
pCO2 arterial: 40.7 mmHg (ref 35.0–45.0)
pH, Arterial: 7.384 (ref 7.350–7.450)

## 2013-03-26 LAB — CBC WITH DIFFERENTIAL/PLATELET
Basophils Absolute: 0 10*3/uL (ref 0.0–0.1)
Basophils Absolute: 0 10*3/uL (ref 0.0–0.1)
Basophils Relative: 0 % (ref 0–1)
Basophils Relative: 0 % (ref 0–1)
Eosinophils Absolute: 0.2 10*3/uL (ref 0.0–0.7)
Eosinophils Relative: 2 % (ref 0–5)
Eosinophils Relative: 1 % (ref 0–5)
HCT: 33.2 % — ABNORMAL LOW (ref 39.0–52.0)
HCT: 31.6 % — ABNORMAL LOW (ref 39.0–52.0)
Hemoglobin: 11 g/dL — ABNORMAL LOW (ref 13.0–17.0)
Lymphocytes Relative: 7 % — ABNORMAL LOW (ref 12–46)
Lymphocytes Relative: 9 % — ABNORMAL LOW (ref 12–46)
Lymphs Abs: 0.5 10*3/uL — ABNORMAL LOW (ref 0.7–4.0)
MCH: 29.8 pg (ref 26.0–34.0)
MCHC: 33.1 g/dL (ref 30.0–36.0)
MCHC: 32.9 g/dL (ref 30.0–36.0)
MCV: 90 fL (ref 78.0–100.0)
MCV: 90.8 fL (ref 78.0–100.0)
Monocytes Absolute: 0.4 10*3/uL (ref 0.1–1.0)
Monocytes Absolute: 0.6 10*3/uL (ref 0.1–1.0)
Monocytes Relative: 6 % (ref 3–12)
Neutro Abs: 6.6 10*3/uL (ref 1.7–7.7)
Neutrophils Relative %: 85 % — ABNORMAL HIGH (ref 43–77)
Platelets: 153 10*3/uL (ref 150–400)
Platelets: 146 10*3/uL — ABNORMAL LOW (ref 150–400)
RBC: 3.69 MIL/uL — ABNORMAL LOW (ref 4.22–5.81)
RDW: 13.8 % (ref 11.5–15.5)
RDW: 14 % (ref 11.5–15.5)
WBC: 7.7 10*3/uL (ref 4.0–10.5)
WBC: 8.1 10*3/uL (ref 4.0–10.5)

## 2013-03-26 LAB — COMPREHENSIVE METABOLIC PANEL
ALT: 7 U/L (ref 0–53)
ALT: 7 U/L (ref 0–53)
AST: 16 U/L (ref 0–37)
AST: 14 U/L (ref 0–37)
Albumin: 2.7 g/dL — ABNORMAL LOW (ref 3.5–5.2)
Alkaline Phosphatase: 63 U/L (ref 39–117)
BUN: 22 mg/dL (ref 6–23)
CO2: 23 mEq/L (ref 19–32)
CO2: 28 mEq/L (ref 19–32)
Calcium: 9.1 mg/dL (ref 8.4–10.5)
Calcium: 9.1 mg/dL (ref 8.4–10.5)
Chloride: 105 mEq/L (ref 96–112)
Chloride: 107 mEq/L (ref 96–112)
Creatinine, Ser: 1.13 mg/dL (ref 0.50–1.35)
Creatinine, Ser: 1.17 mg/dL (ref 0.50–1.35)
GFR calc Af Amer: 73 mL/min — ABNORMAL LOW (ref >90)
GFR calc Af Amer: 70 mL/min — ABNORMAL LOW (ref >90)
GFR calc non Af Amer: 63 mL/min — ABNORMAL LOW (ref >90)
GFR calc non Af Amer: 60 mL/min — ABNORMAL LOW (ref >90)
Glucose, Bld: 113 mg/dL — ABNORMAL HIGH (ref 70–99)
Glucose, Bld: 109 mg/dL — ABNORMAL HIGH (ref 70–99)
Potassium: 3.4 mEq/L — ABNORMAL LOW (ref 3.5–5.1)
Sodium: 138 mEq/L (ref 135–145)
Total Bilirubin: 0.4 mg/dL (ref 0.3–1.2)
Total Bilirubin: 0.4 mg/dL (ref 0.3–1.2)
Total Protein: 6.3 g/dL (ref 6.0–8.3)

## 2013-03-26 LAB — CG4 I-STAT (LACTIC ACID): Lactic Acid, Venous: 0.83 mmol/L (ref 0.5–2.2)

## 2013-03-26 LAB — URINALYSIS, ROUTINE W REFLEX MICROSCOPIC
Bilirubin Urine: NEGATIVE
Glucose, UA: NEGATIVE mg/dL
Hgb urine dipstick: NEGATIVE
Ketones, ur: NEGATIVE mg/dL
Leukocytes, UA: NEGATIVE
Nitrite: NEGATIVE
Protein, ur: NEGATIVE mg/dL
Specific Gravity, Urine: 1.021 (ref 1.005–1.030)
Urobilinogen, UA: 0.2 mg/dL (ref 0.0–1.0)
pH: 7.5 (ref 5.0–8.0)

## 2013-03-26 LAB — LIPASE, BLOOD: Lipase: 30 U/L (ref 11–59)

## 2013-03-26 LAB — APTT: aPTT: 35 seconds (ref 24–37)

## 2013-03-26 LAB — PHOSPHORUS: Phosphorus: 2.9 mg/dL (ref 2.3–4.6)

## 2013-03-26 MED ORDER — IPRATROPIUM BROMIDE 0.02 % IN SOLN: 0.5000 mg | Freq: Three times a day (TID) | RESPIRATORY_TRACT | Status: DC

## 2013-03-26 MED ORDER — SODIUM CHLORIDE 0.9 % IV SOLN
INTRAVENOUS | Status: AC
  Administered 2013-03-26: 20:00:00 via INTRAVENOUS

## 2013-03-26 MED ORDER — SODIUM CHLORIDE 0.9 % IV SOLN: INTRAVENOUS | Status: AC

## 2013-03-26 MED ORDER — POLYETHYLENE GLYCOL 3350 17 G PO PACK
17.0000 g | PACK | Freq: Every day | ORAL | Status: DC
  Administered 2013-03-26 – 2013-03-28 (×3): 17 g via ORAL
  Filled 2013-03-26 (×3): qty 1

## 2013-03-26 MED ORDER — SIMVASTATIN 40 MG PO TABS
40.0000 mg | ORAL_TABLET | Freq: Every evening | ORAL | Status: DC
  Administered 2013-03-26 – 2013-03-27 (×2): 40 mg via ORAL
  Filled 2013-03-26 (×3): qty 1

## 2013-03-26 MED ORDER — SODIUM CHLORIDE 0.9 % IJ SOLN
3.0000 mL | Freq: Two times a day (BID) | INTRAMUSCULAR | Status: DC
  Administered 2013-03-26 – 2013-03-28 (×3): 3 mL via INTRAVENOUS

## 2013-03-26 MED ORDER — ONDANSETRON HCL 4 MG PO TABS: 4.0000 mg | ORAL_TABLET | Freq: Four times a day (QID) | ORAL | Status: DC | PRN

## 2013-03-26 MED ORDER — GUAIFENESIN-DM 100-10 MG/5ML PO SYRP: 5.0000 mL | ORAL_SOLUTION | ORAL | Status: DC | PRN

## 2013-03-26 MED ORDER — OXYCODONE HCL 5 MG PO TABS
15.0000 mg | ORAL_TABLET | ORAL | Status: DC | PRN
  Administered 2013-03-27 (×2): 15 mg via ORAL
  Filled 2013-03-26 (×2): qty 3

## 2013-03-26 MED ORDER — PANTOPRAZOLE SODIUM 40 MG PO TBEC
40.0000 mg | DELAYED_RELEASE_TABLET | Freq: Every day | ORAL | Status: DC
  Administered 2013-03-26 – 2013-03-27 (×2): 40 mg via ORAL
  Filled 2013-03-26 (×3): qty 1

## 2013-03-26 MED ORDER — ENSURE COMPLETE PO LIQD
237.0000 mL | Freq: Three times a day (TID) | ORAL | Status: DC
  Administered 2013-03-26 – 2013-03-27 (×2): 237 mL via ORAL

## 2013-03-26 MED ORDER — ALBUTEROL SULFATE (5 MG/ML) 0.5% IN NEBU
2.5000 mg | INHALATION_SOLUTION | Freq: Four times a day (QID) | RESPIRATORY_TRACT | Status: DC
  Administered 2013-03-27: 2.5 mg via RESPIRATORY_TRACT
  Filled 2013-03-26: qty 0.5

## 2013-03-26 MED ORDER — HYDROCODONE-ACETAMINOPHEN 7.5-325 MG/15ML PO SOLN: 15.0000 mL | Freq: Four times a day (QID) | ORAL | Status: DC | PRN

## 2013-03-26 MED ORDER — SERTRALINE HCL 100 MG PO TABS
100.0000 mg | ORAL_TABLET | Freq: Two times a day (BID) | ORAL | Status: DC
  Administered 2013-03-26: 100 mg via ORAL
  Filled 2013-03-26 (×3): qty 1

## 2013-03-26 MED ORDER — EMOLLIENT BASE EX CREA: 1.0000 "application " | TOPICAL_CREAM | Freq: Two times a day (BID) | CUTANEOUS | Status: DC

## 2013-03-26 MED ORDER — LIP MEDEX EX OINT
TOPICAL_OINTMENT | CUTANEOUS | Status: DC | PRN
  Filled 2013-03-26: qty 7

## 2013-03-26 MED ORDER — LEVOFLOXACIN IN D5W 750 MG/150ML IV SOLN
750.0000 mg | Freq: Once | INTRAVENOUS | Status: AC
  Administered 2013-03-26: 750 mg via INTRAVENOUS
  Filled 2013-03-26: qty 150

## 2013-03-26 MED ORDER — ALBUTEROL SULFATE (5 MG/ML) 0.5% IN NEBU: 2.5000 mg | INHALATION_SOLUTION | RESPIRATORY_TRACT | Status: DC | PRN

## 2013-03-26 MED ORDER — MECLIZINE HCL 25 MG PO TABS
25.0000 mg | ORAL_TABLET | Freq: Two times a day (BID) | ORAL | Status: DC | PRN
  Filled 2013-03-26: qty 1

## 2013-03-26 MED ORDER — LEVOFLOXACIN IN D5W 750 MG/150ML IV SOLN
750.0000 mg | INTRAVENOUS | Status: DC
  Administered 2013-03-27: 750 mg via INTRAVENOUS
  Filled 2013-03-26: qty 150

## 2013-03-26 MED ORDER — ZINC SULFATE 220 (50 ZN) MG PO CAPS
220.0000 mg | ORAL_CAPSULE | Freq: Every day | ORAL | Status: DC
  Administered 2013-03-26 – 2013-03-28 (×3): 220 mg via ORAL
  Filled 2013-03-26 (×3): qty 1

## 2013-03-26 MED ORDER — ALBUTEROL SULFATE (5 MG/ML) 0.5% IN NEBU
2.5000 mg | INHALATION_SOLUTION | Freq: Four times a day (QID) | RESPIRATORY_TRACT | Status: DC
  Administered 2013-03-26: 2.5 mg via RESPIRATORY_TRACT
  Filled 2013-03-26: qty 0.5

## 2013-03-26 MED ORDER — ALPRAZOLAM 0.5 MG PO TABS
0.5000 mg | ORAL_TABLET | Freq: Three times a day (TID) | ORAL | Status: DC | PRN
  Administered 2013-03-26 – 2013-03-27 (×2): 0.5 mg via ORAL
  Filled 2013-03-26 (×2): qty 1

## 2013-03-26 MED ORDER — METHYLPREDNISOLONE SODIUM SUCC 125 MG IJ SOLR
60.0000 mg | Freq: Four times a day (QID) | INTRAMUSCULAR | Status: DC
Start: 2013-03-27 — End: 2013-03-27
  Administered 2013-03-27 (×3): 60 mg via INTRAVENOUS
  Filled 2013-03-26 (×6): qty 0.96

## 2013-03-26 MED ORDER — IPRATROPIUM BROMIDE 0.02 % IN SOLN
0.5000 mg | Freq: Four times a day (QID) | RESPIRATORY_TRACT | Status: DC
  Administered 2013-03-27: 0.5 mg via RESPIRATORY_TRACT
  Filled 2013-03-26: qty 2.5

## 2013-03-26 MED ORDER — IPRATROPIUM BROMIDE 0.02 % IN SOLN
0.5000 mg | Freq: Four times a day (QID) | RESPIRATORY_TRACT | Status: DC
  Administered 2013-03-26: 0.5 mg via RESPIRATORY_TRACT
  Filled 2013-03-26: qty 2.5

## 2013-03-26 MED ORDER — IPRATROPIUM BROMIDE 0.02 % IN SOLN: 0.5000 mg | Freq: Four times a day (QID) | RESPIRATORY_TRACT | Status: DC

## 2013-03-26 MED ORDER — ONDANSETRON HCL 4 MG/2ML IJ SOLN: 4.0000 mg | Freq: Four times a day (QID) | INTRAMUSCULAR | Status: DC | PRN

## 2013-03-26 MED ORDER — ALBUTEROL SULFATE (5 MG/ML) 0.5% IN NEBU: 2.5000 mg | INHALATION_SOLUTION | RESPIRATORY_TRACT | Status: DC

## 2013-03-26 MED ORDER — ACYCLOVIR 400 MG PO TABS
400.0000 mg | ORAL_TABLET | Freq: Two times a day (BID) | ORAL | Status: DC
  Administered 2013-03-26 – 2013-03-28 (×4): 400 mg via ORAL
  Filled 2013-03-26 (×5): qty 1

## 2013-03-26 MED ORDER — DORZOLAMIDE HCL-TIMOLOL MAL 2-0.5 % OP SOLN
1.0000 [drp] | Freq: Two times a day (BID) | OPHTHALMIC | Status: DC
  Administered 2013-03-26 – 2013-03-28 (×4): 1 [drp] via OPHTHALMIC
  Filled 2013-03-26: qty 10

## 2013-03-26 MED ORDER — IPRATROPIUM BROMIDE 0.02 % IN SOLN: 0.5000 mg | Freq: Four times a day (QID) | RESPIRATORY_TRACT | Status: DC | PRN

## 2013-03-26 MED ORDER — ALBUTEROL SULFATE (5 MG/ML) 0.5% IN NEBU: 2.5000 mg | INHALATION_SOLUTION | Freq: Three times a day (TID) | RESPIRATORY_TRACT | Status: DC

## 2013-03-26 MED ORDER — MAGIC MOUTHWASH W/LIDOCAINE
10.0000 mL | Freq: Three times a day (TID) | ORAL | Status: DC
  Administered 2013-03-26 – 2013-03-28 (×5): 10 mL via ORAL
  Filled 2013-03-26 (×7): qty 10

## 2013-03-26 MED ORDER — IPRATROPIUM BROMIDE 0.02 % IN SOLN: 0.5000 mg | RESPIRATORY_TRACT | Status: DC | PRN

## 2013-03-26 MED ORDER — ASPIRIN EC 81 MG PO TBEC
81.0000 mg | DELAYED_RELEASE_TABLET | Freq: Every day | ORAL | Status: DC
  Administered 2013-03-26 – 2013-03-27 (×2): 81 mg via ORAL
  Filled 2013-03-26 (×3): qty 1

## 2013-03-26 MED ORDER — ZOLPIDEM TARTRATE 5 MG PO TABS
5.0000 mg | ORAL_TABLET | Freq: Every evening | ORAL | Status: DC | PRN
  Administered 2013-03-28: 5 mg via ORAL
  Filled 2013-03-26: qty 1

## 2013-03-26 MED ORDER — ADULT MULTIVITAMIN W/MINERALS CH
1.0000 | ORAL_TABLET | Freq: Every day | ORAL | Status: DC
  Administered 2013-03-26: 1 via ORAL
  Filled 2013-03-26 (×2): qty 1

## 2013-03-26 MED ORDER — ZINC 30 MG PO CAPS: 1.0000 | ORAL_CAPSULE | Freq: Every day | ORAL | Status: DC

## 2013-03-26 MED ORDER — SODIUM CHLORIDE 0.9 % IV BOLUS (SEPSIS)
1000.0000 mL | Freq: Once | INTRAVENOUS | Status: AC
  Administered 2013-03-26: 1000 mL via INTRAVENOUS

## 2013-03-26 MED ORDER — PRUTECT EX EMUL
Freq: Two times a day (BID) | CUTANEOUS | Status: DC
  Administered 2013-03-26 – 2013-03-28 (×4): via TOPICAL
  Filled 2013-03-26: qty 45

## 2013-03-26 MED ORDER — FERROUS SULFATE 325 (65 FE) MG PO TABS
325.0000 mg | ORAL_TABLET | Freq: Three times a day (TID) | ORAL | Status: DC
  Administered 2013-03-26 – 2013-03-28 (×5): 325 mg via ORAL
  Filled 2013-03-26 (×8): qty 1

## 2013-03-26 MED ORDER — LORATADINE 10 MG PO TABS
10.0000 mg | ORAL_TABLET | Freq: Every day | ORAL | Status: DC
  Administered 2013-03-26 – 2013-03-27 (×2): 10 mg via ORAL
  Filled 2013-03-26 (×3): qty 1

## 2013-03-26 MED ORDER — LEVOTHYROXINE SODIUM 200 MCG PO TABS
200.0000 ug | ORAL_TABLET | Freq: Every day | ORAL | Status: DC
  Administered 2013-03-26 – 2013-03-28 (×2): 200 ug via ORAL
  Filled 2013-03-26 (×3): qty 1

## 2013-03-26 MED ORDER — BISOPROLOL FUMARATE 10 MG PO TABS
10.0000 mg | ORAL_TABLET | Freq: Every day | ORAL | Status: DC
  Administered 2013-03-26 – 2013-03-27 (×2): 10 mg via ORAL
  Filled 2013-03-26 (×3): qty 1

## 2013-03-26 MED ORDER — METHYLPREDNISOLONE SODIUM SUCC 125 MG IJ SOLR
60.0000 mg | INTRAMUSCULAR | Status: DC
  Administered 2013-03-26: 60 mg via INTRAVENOUS
  Filled 2013-03-26: qty 0.96

## 2013-03-26 MED ORDER — DIAZEPAM 5 MG PO TABS
5.0000 mg | ORAL_TABLET | Freq: Two times a day (BID) | ORAL | Status: DC | PRN
  Administered 2013-03-26 – 2013-03-27 (×2): 5 mg via ORAL
  Filled 2013-03-26 (×2): qty 1

## 2013-03-26 MED ORDER — MORPHINE SULFATE 2 MG/ML IJ SOLN: 1.0000 mg | INTRAMUSCULAR | Status: DC | PRN

## 2013-03-26 NOTE — ED Notes (Addendum)
Patient is aware we need urine. Has been given urinal.

## 2013-03-26 NOTE — ED Notes (Signed)
ZOX:[id number]<AV> Expected date:<BR> Expected time:<BR> Means of arrival:<BR> Comments:<BR> Jaw pain

## 2013-03-26 NOTE — Progress Notes (Signed)
Patient noted to have history of nocturnal CPAP use at home. Spoke with him about the use of a machine during his admission. He denies the continued use at home due to discomfort secondary to his mouth cancer treatments. Order placed as prn per protocol.

## 2013-03-26 NOTE — ED Provider Notes (Signed)
History     CSN: [id number]  Arrival date & time 03/26/13  1340   First MD Initiated Contact with Patient 03/26/13 1431      Chief Complaint  Patient presents with  . Nausea  . Emesis    (Consider location/radiation/quality/duration/timing/severity/associated sxs/prior treatment) HPI Comments: 72 y/o male presents to the ED via EMS complaining of productive cough with clear phlegm x 2 days. Last night he developed cold sweats overnight followed by nausea and vomiting today. He is currently being treated for mouth cancer with radiation, last treatment yesterday. Denies chest pain, sob, fatigue, confusion, urinary changes. He lives at home with his wife. Denies sick contacts. Oncologist Dr. Michell Heinrich. PCP- [name], MD   Patient is a 72 y.o. male presenting with vomiting. The history is provided by the patient.  Emesis Associated symptoms: chills   Associated symptoms: no abdominal pain and no headaches     Past Medical History  Diagnosis Date  . Coronary artery disease     stent placement 2000, does not see cardiologist at this time  . Hypertension     sees Dr. Hart Carwin  . Thyroid disease   . Anxiety   . Sleep apnea     uses cpap machine  . Chronic kidney disease     renal insufficiency, [place] kidney  . GERD (gastroesophageal reflux disease)   . Arthritis     rhematoid arthritis  . Gout     pseudo gout  . Cancer     pre-cancerous lesions removed 11/22/12 and hx of  . Anemia     "chronic anemic"  . Hepatitis     hepatitis "C"  . Chronic back pain     hx of  . 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 Anxiety   . Hypertension   . Dyslipidemia   . Hypothyroid   . CAD (coronary artery disease)     s/p stenting  . Insomnia     cpap  . Full dentures   . Hearing impairment     job related, hbilat hearing aids    Past Surgical History  Procedure Laterality Date  . Tonsillectomy    . Coronary angioplasty  2000    stent  placement 2000  . Dental extractions      upper and lower  . Hip pinning,cannulated Left 12/22/2012    Procedure: CANNULATED HIP PINNING;  Surgeon: Kathryne Hitch, MD;  Location: [facility] OR;  Service: Orthopedics;  Laterality: Left;  . Radical neck dissection Right 01/22/13    [facility]    Family History  Problem Relation Age of Onset  . Coronary artery disease Brother 54  . Heart attack Brother   . Coronary artery disease Father     stents in 70's  . Cancer Mother     bone    History  Substance Use Topics  . Smoking status: Former Smoker -- 1.00 packs/day for 27 years    Types: Cigarettes    Quit date: 11/08/1983  . Smokeless tobacco: Never Used  . Alcohol Use: Yes     Comment: "social"      Review of Systems  Constitutional: Positive for fever, chills and diaphoresis.  Respiratory: Positive for cough. Negative for shortness of breath.   Cardiovascular: Negative for chest pain.  Gastrointestinal: Positive for nausea and vomiting. Negative for abdominal pain.  Musculoskeletal: Negative for back pain.  Neurological: Negative for dizziness, weakness, light-headedness and headaches.  Psychiatric/Behavioral: Negative for confusion.  All other systems reviewed and are negative.    Allergies  Cellcept; Colcrys; Erythromycin; Methotrexate derivatives; Neosporin; and Ultram  Home Medications   Current Outpatient Rx  Name  Route  Sig  Dispense  Refill  . acyclovir (ZOVIRAX) 400 MG tablet   Oral   Take 400 mg by mouth 2 (two) times daily.          [name] ALPRAZolam (XANAX) 0.5 MG tablet   Oral   Take 0.5 mg by mouth 3 (three) times daily as needed for sleep.          [name] Alum & Mag Hydroxide-Simeth (MAGIC MOUTHWASH W/LIDOCAINE) SOLN   Swish & Spit   Swish and spit 10 mLs 3 (three) times daily.         [name] aspirin EC 81 MG tablet   Oral   Take 81 mg by mouth at bedtime.          . bisoprolol (ZEBETA) 5 MG tablet   Oral   Take 2 tablets (10 mg total) by mouth  daily.         . cetirizine (ZYRTEC) 10 MG tablet   Oral   Take 10 mg by mouth daily.         . Cholecalciferol (VITAMIN D) 1000 UNITS capsule   Oral   Take 1,000 Units by mouth daily.         [name] desonide (DESOWEN) 0.05 % cream   Topical   Apply 1 application topically 2 (two) times daily. Applied to face and behind ears.         . Dexamethasone 0.7 MG IMPL   Both Eyes   Place 1 each into both eyes every 6 (six) months.         . diazepam (VALIUM) 5 MG tablet   Oral   Take 1 tablet (5 mg total) by mouth every 12 (twelve) hours as needed for anxiety. For anxiety   30 tablet   0   . dorzolamide-timolol (COSOPT) 22.3-6.8 MG/ML ophthalmic solution   Both Eyes   Place 1 drop into both eyes 2 (two) times daily.         [name] emollient (BIAFINE) cream   Topical   Apply 1 application topically 2 (two) times daily. Apply to right side neck after rad tx and bedtime,/prn, except not 4 hours prior to rad txs, apply daily         . feeding supplement (ENSURE COMPLETE) LIQD   Oral   Take 237 mLs by mouth 3 (three) times daily between meals.         . ferrous sulfate 325 (65 FE) MG tablet   Oral   Take 1 tablet (325 mg total) by mouth 3 (three) times daily after meals.         [name] HYDROcodone-acetaminophen (HYCET) 7.5-325 mg/15 ml solution   Oral   Take 15 mLs by mouth 4 (four) times daily as needed for pain.   473 mL   0   . levothyroxine (SYNTHROID, LEVOTHROID) 200 MCG tablet   Oral   Take 200 mcg by mouth daily.         . meclizine (ANTIVERT) 25 MG tablet   Oral   Take 25 mg by mouth every 12 (twelve) hours as needed for dizziness.          . Multiple Vitamin (MULTIVITAMIN WITH MINERALS) TABS   Oral   Take 1 tablet by mouth daily.         [name]  oxyCODONE (ROXICODONE) 15 MG immediate release tablet   Oral   Take 15 mg by mouth every 4 (four) hours as needed for pain.         . pantoprazole (PROTONIX) 40 MG tablet   Oral   Take 40 mg by mouth daily.          . polyethylene glycol (MIRALAX / GLYCOLAX) packet   Oral   Take 17 g by mouth daily.         . predniSONE (DELTASONE) 5 MG tablet   Oral   Take 5 mg by mouth daily.         . sertraline (ZOLOFT) 100 MG tablet   Oral   Take 100 mg by mouth 2 (two) times daily.         . simvastatin (ZOCOR) 40 MG tablet   Oral   Take 40 mg by mouth every evening.         . Zinc 30 MG CAPS   Oral   Take 1 capsule by mouth daily.         [name] zolpidem (AMBIEN) 10 MG tablet   Oral   Take 5-10 mg by mouth at bedtime as needed. For sleep           BP 106/44  Pulse 63  Temp(Src) 100.7 F (38.2 C)  SpO2 96%  Physical Exam  Nursing note and vitals reviewed. Constitutional: He is oriented to person, place, and time. He appears well-developed and well-nourished. No distress.  HENT:  Head: Normocephalic and atraumatic.  Mouth/Throat: Oropharynx is clear and moist.  Eyes: Conjunctivae and EOM are normal. Pupils are equal, round, and reactive to light. No scleral icterus.  Neck: Normal range of motion. Neck supple. No tracheal deviation present.  Cardiovascular: Normal rate, regular rhythm, normal heart sounds and intact distal pulses.   Pulmonary/Chest: No accessory muscle usage. Tachypnea noted. No respiratory distress. He has rhonchi (scattered, worse left lower lung fields).  Abdominal: Soft. Normal appearance and bowel sounds are normal. He exhibits no distension and no mass. There is no tenderness.  Musculoskeletal: Normal range of motion. He exhibits no edema.  Neurological: He is alert and oriented to person, place, and time.  Skin: Skin is warm.  Clammy.  Psychiatric: He has a normal mood and affect. His behavior is normal.    ED Course  Procedures (including critical care time)  Labs Reviewed  CBC WITH DIFFERENTIAL - Abnormal; Notable for the following:    RBC 3.69 (*)    Hemoglobin 11.0 (*)    HCT 33.2 (*)    Neutrophils Relative % 85 (*)    Lymphocytes  Relative 7 (*)    Lymphs Abs 0.5 (*)    All other components within normal limits  COMPREHENSIVE METABOLIC PANEL - Abnormal; Notable for the following:    Potassium 3.4 (*)    Glucose, Bld 113 (*)    Albumin 2.7 (*)    GFR calc non Af Amer 63 (*)    GFR calc Af Amer 73 (*)    All other components within normal limits  CULTURE, BLOOD (ROUTINE X 2)  CULTURE, BLOOD (ROUTINE X 2)  LIPASE, BLOOD  URINALYSIS, ROUTINE W REFLEX MICROSCOPIC  BLOOD GAS, ARTERIAL  CG4 I-STAT (LACTIC ACID)   Dg Chest Portable 1 View  03/26/2013   *RADIOLOGY REPORT*  Clinical Data: Nausea, emesis, coronary artery disease  PORTABLE CHEST - 1 VIEW  Comparison: 12/21/2012  Findings: Skin folds project over the apices bilaterally.  No new pulmonary opacity.  Hyperaeration and coarsened lung markings suggest COPD.  Heart size is normal.  Right AC joint degenerative change noted.  IMPRESSION: Change of COPD without focal acute finding.   Original Report Authenticated By: Christiana Pellant, M.D.     1. COPD exacerbation       MDM  72 y/o male with COPD exacerbation. No pneumonia. IV levaquin given. Patient under radiation treatment for mouth cancer. Febrile with rectal temp 100.1. Does not meet SIRS criteria. He will be admitted for his COPD exacerbation. Admission accepted by Dr. Izola Price, [facility] team 2. Case discussed with Dr. Juleen China who also evaluated patient and agrees with plan of care.        Trevor Mace, PA-C 03/26/13 1610

## 2013-03-26 NOTE — H&P (Signed)
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 Pt denies similar events in the past. He denies chest pain, no specific urinary concerns.  In ED, PT with hypoxia in 70's, responded well to nebulizer treatment and [place], oxygen saturations increased to mid 90's. [facility] asked to admit for further evaluation of COPD management.   Assessment and Plan:  Principal Problem:   COPD with acute exacerbation - will admit the pt to telemetry bed as pt has history of paroxysmal a-fib and will be on nebulizer treatments which will make him more prone to tachycardic events - will provide supportive care with nebulizers scheduled and as needed, oxygen via [place], empiric ABX - sputum culture, gram stain, urine legionella and strep pneumo ordered Active Problems:   CAP (community acquired pneumonia) - not evident of CXR but clinically appears to be associated with acute COPD - will treat with empiric ABX as noted above - sputum analysis ordered    Chronic kidney disease. stage 2 - based on GFR but creatinine is stable and within normal limits - BMP in AM   GERD (gastroesophageal reflux disease) - Stable, continue Protonix    Anemia - Hg and Hct stable and at pt's baseline, CBC in AM   Hypothyroid - continue synthroid     Chronic back pain - analgesia as needed   Paroxysmal a-fib - pt currently in sinus rhythm, he denies long term anticoagulation use - pt explains he has ben on xarelto in the past after hip fracture, post operatively - confirmed with his pharmacy that no xarelto was refilled since 12/2012  - monitor on telemetry   Code Status: Full Family Communication: Pt at bedside Disposition Plan: Admit to telemetry bed   Review of Systems:  Constitutional: Positive for fever, chills and malaise/fatigue. Negative for diaphoresis.  HENT: Negative for hearing loss, ear pain, nosebleeds, congestion, sore throat, neck pain, tinnitus and ear discharge.   Eyes: Negative for blurred vision, double vision, photophobia, pain, discharge and redness.  Respiratory: Positive for cough, sputum production, shortness of breath, wheezing   Cardiovascular: Negative for chest pain, palpitations, orthopnea, claudication and leg swelling.  Gastrointestinal: Positive for nausea, vomiting and abdominal pain. Negative for heartburn, constipation, blood in stool and melena.  Genitourinary: Negative for dysuria, urgency, frequency, hematuria and flank pain.  Musculoskeletal: Negative for myalgias, back pain, joint pain and falls.  Skin: Negative for itching and rash.  Neurological: Negative for dizziness and weakness. Negative for tingling, tremors, sensory change, speech change, focal weakness, loss of consciousness and headaches.  Endo/Heme/Allergies: Negative for environmental allergies and polydipsia. Does not bruise/bleed easily.  Psychiatric/Behavioral: Negative for suicidal ideas. The patient is not nervous/anxious.      Past Medical History  Diagnosis Date  . Coronary artery disease     stent placement 2000, does not see cardiologist at this time  . Hypertension  sees Dr. Hart Carwin  . Thyroid disease   . Anxiety   . Sleep apnea     uses cpap machine  . Chronic kidney disease     renal insufficiency,  [place] kidney  . GERD (gastroesophageal reflux disease)   . Arthritis     rhematoid arthritis  . Gout     pseudo gout  . Cancer     pre-cancerous lesions removed 11/22/12 and hx of  . Anemia     "chronic anemic"  . Hepatitis     hepatitis "C"  . Chronic back pain     hx of  . 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 Anxiety   . Hypertension   . Dyslipidemia   . Hypothyroid   . CAD (coronary artery disease)     s/p stenting  . Insomnia     cpap  . Full dentures   . Hearing impairment     job related, hbilat hearing aids    Past Surgical History  Procedure Laterality Date  . Tonsillectomy    . Coronary angioplasty  2000    stent placement 2000  . Dental extractions      upper and lower  . Hip pinning,cannulated Left 12/22/2012    Procedure: CANNULATED HIP PINNING;  Surgeon: Kathryne Hitch, MD;  [facility];  Service: Orthopedics;  Laterality: Left;  . Radical neck dissection Right 01/22/13    [facility]    Social History:  reports that he quit smoking about 29 years ago. His smoking use included Cigarettes. He has a 27 pack-year smoking history. He has never used smokeless tobacco. He reports that  drinks alcohol. He reports that he does not use illicit drugs.  Allergies  Allergen Reactions  . Cellcept (Mycophenolate Mofetil) Other (See Comments)    Reaction unknown  . Colcrys (Colchicine) Other (See Comments)    myalgias  . Erythromycin Other (See Comments)    Reaction unknown  . Methotrexate Derivatives Hives, Itching and Rash    Only in tablet form  . Neosporin (Neomycin-Bacitracin Zn-Polymyx) Rash  . Ultram (Tramadol) Rash    Family History  Problem Relation Age of Onset  . Coronary artery disease Brother 26  . Heart attack Brother   . Coronary artery disease Father     stents in 70's  . Cancer Mother     bone    Medication Sig  acyclovir (ZOVIRAX) 400 MG tablet Take 400 mg by mouth 2 (two) times daily.   ALPRAZolam  (XANAX) 0.5 MG tablet Take 0.5 mg 3 times daily as needed for sleep.   aspirin EC 81 MG tablet Take 81 mg by mouth at bedtime.   bisoprolol (ZEBETA) 5 MG tablet Take 2 tablets (10 mg total) by mouth daily.  cetirizine (ZYRTEC) 10 MG tablet Take 10 mg by mouth daily.  Dexamethasone 0.7 MG IMPL Place 1 each into both eyes every 6 (six) months.  diazepam (VALIUM) 5 MG tablet Take 1 tablet every 12 hours as needed for anxiety.   ferrous sulfate 325 (65 FE) MG tablet Take 1 tablet 3 (three) times daily after meals.  HYCET 7.5-325 mg/15 ml solution Take 15 mLs 4 times daily as needed for pain.  levothyroxine 200 MCG tablet Take 200 mcg by mouth daily.  meclizine (ANTIVERT) 25 MG tablet Take 25 mg every 12 hours as needed for dizziness.   oxyCODONE 15 MG IR tablet Take 15 mg every 4 (four) hours as needed  for pain.  pantoprazole  40 MG tablet Take 40 mg by mouth daily.  polyethylene glycol packet Take 17 g by mouth daily.  predniSONE  5 MG tablet Take 5 mg by mouth daily.  sertraline (ZOLOFT) 100 MG tablet Take 100 mg by mouth 2 (two) times daily.  simvastatin (ZOCOR) 40 MG tablet Take 40 mg by mouth every evening.  zolpidem (AMBIEN) 10 MG tablet Take 5-10 mg at bedtime as needed. For sleep   Physical Exam: Filed Vitals:   03/26/13 1350 03/26/13 1359 03/26/13 1606  BP: 106/44  98/57  Pulse: 63  56  Temp: 100.7 F (38.2 C)  100.1 F (37.8 C)  TempSrc:   Rectal  Resp:   20  SpO2: 79% 96% 100%    Physical Exam  Constitutional: Appears well-developed and well-nourished. No distress.  HENT: Normocephalic. External right and left ear normal. Oropharynx is clear and moist.  Eyes: Conjunctivae and EOM are normal. PERRLA, no scleral icterus.  Neck: Normal ROM. Neck supple. No JVD. No tracheal deviation. No thyromegaly.  CVS: RRR, S1/S2 +, no murmurs, no gallops, no carotid bruit.  Pulmonary: Effort and breath sounds normal, no stridor, bilateral scattered rhonchi with expiratory wheezing   Abdominal: Soft. BS +,  no distension, tenderness, rebound or guarding.  Musculoskeletal: Normal range of motion. No edema and no tenderness.  Lymphadenopathy: No lymphadenopathy noted, cervical, inguinal. Neuro: Alert. Normal reflexes, muscle tone coordination. No cranial nerve deficit. Skin: Skin is warm and dry. No rash noted. Not diaphoretic. No erythema. No pallor.  Psychiatric: Normal mood and affect. Behavior, judgment, thought content normal.   Labs on Admission:  Basic Metabolic Panel:  Recent Labs Lab 03/26/13 1420  NA 138  K 3.4*  CL 105  CO2 23  GLUCOSE 113*  BUN 22  CREATININE 1.13  CALCIUM 9.1   Liver Function Tests:  Recent Labs Lab 03/26/13 1420  AST 16  ALT 7  ALKPHOS 63  BILITOT 0.4  PROT 6.3  ALBUMIN 2.7*    Recent Labs Lab 03/26/13 1420  LIPASE 30   CBC:  Recent Labs Lab 03/26/13 1420  WBC 7.7  NEUTROABS 6.6  HGB 11.0*  HCT 33.2*  MCV 90.0  PLT 153   Radiological Exams on Admission: Dg Chest Portable 1 View 03/26/2013    Change of COPD without focal acute finding.    EKG: Normal sinus rhythm, no ST/T wave changes  Debbora Presto, MD  [facility] Pager [phone]  If 7PM-7AM, please contact night-coverage [email or web address] Password [facility] 03/26/2013, 6:11 PM

## 2013-03-26 NOTE — ED Notes (Signed)
Per EMS: cough x 2 days, chills and n/v started today. Radiation for mouth cancer started 2 weeks ago. 1,000 mg tylenol, 4 mg Zofran 20 g left forearm.

## 2013-03-26 NOTE — Progress Notes (Signed)
ANTIBIOTIC CONSULT NOTE - INITIAL  Pharmacy Consult for Levaquin Indication: pneumonia  Allergies  Allergen Reactions  . Cellcept (Mycophenolate Mofetil) Other (See Comments)    Reaction unknown  . Colcrys (Colchicine) Other (See Comments)    myalgias  . Erythromycin Other (See Comments)    Reaction unknown  . Methotrexate Derivatives Hives, Itching and Rash    Only in tablet form  . Neosporin (Neomycin-Bacitracin Zn-Polymyx) Rash  . Ultram (Tramadol) Rash    Patient Measurements: Height: 6' (182.9 cm) Weight: 158 lb 8 oz (71.895 kg) IBW/kg (Calculated) : 77.6 Adjusted Body Weight:   Vital Signs: Temp: 98.5 F (36.9 C) (05/20 1905) Temp src: Oral (05/20 1905) BP: 96/78 mmHg (05/20 1905) Pulse Rate: 51 (05/20 1905) Intake/Output from previous day:   Intake/Output from this shift:    Labs:  Recent Labs  03/26/13 1420  WBC 7.7  HGB 11.0*  PLT 153  CREATININE 1.13   Estimated Creatinine Clearance: 60.1 ml/min (by C-G formula based on Cr of 1.13). No results found for this basename: VANCOTROUGH, VANCOPEAK, VANCORANDOM, GENTTROUGH, GENTPEAK, GENTRANDOM, TOBRATROUGH, TOBRAPEAK, TOBRARND, AMIKACINPEAK, AMIKACINTROU, AMIKACIN,  in the last 72 hours   Microbiology: No results found for this or any previous visit (from the past 720 hour(s)).  Medical History: Past Medical History  Diagnosis Date  . Coronary artery disease     stent placement 2000, does not see cardiologist at this time  . Hypertension     sees Dr. Hart Carwin  . Thyroid disease   . Anxiety   . Sleep apnea     uses cpap machine  . Chronic kidney disease     renal insufficiency, [place] kidney  . GERD (gastroesophageal reflux disease)   . Arthritis     rhematoid arthritis  . Gout     pseudo gout  . Cancer     pre-cancerous lesions removed 11/22/12 and hx of  . Anemia     "chronic anemic"  . Hepatitis     hepatitis "C"  . Chronic back pain     hx of  . Cancer 10/01/12    R retromolar  trigone, squamous cell  . Anxiety   . Hypertension   . Dyslipidemia   . Hypothyroid   . CAD (coronary artery disease)     s/p stenting  . Insomnia     cpap  . Full dentures   . Hearing impairment     job related, hbilat hearing aids    Medications:  Scheduled:  . sodium chloride   Intravenous STAT  . acyclovir  400 mg Oral BID  . albuterol  2.5 mg Nebulization Q6H  . aspirin EC  81 mg Oral QHS  . bisoprolol  10 mg Oral Daily  . dorzolamide-timolol  1 drop Both Eyes BID  . feeding supplement  237 mL Oral TID BM  . ferrous sulfate  325 mg Oral TID PC  . ipratropium  0.5 mg Nebulization Q6H  . levothyroxine  200 mcg Oral QAC breakfast  . loratadine  10 mg Oral Daily  . magic mouthwash w/lidocaine  10 mL Oral TID  . methylPREDNISolone (SOLU-MEDROL) injection  60 mg Intravenous Q24H  . multivitamin with minerals  1 tablet Oral Daily  . pantoprazole  40 mg Oral Daily  . polyethylene glycol  17 g Oral Daily  . PRUTECT   Topical BID  . sertraline  100 mg Oral BID  . simvastatin  40 mg Oral QPM  . sodium chloride  3 mL  Intravenous Q12H  . zinc sulfate  220 mg Oral Daily   Infusions:  . sodium chloride     PRN: albuterol, ALPRAZolam, diazepam, guaiFENesin-dextromethorphan, HYDROcodone-acetaminophen, ipratropium, meclizine, morphine injection, ondansetron (ZOFRAN) IV, ondansetron, oxyCODONE, zolpidem Assessment: 72 yo M with possible pneumonia.   Symptoms include productive cough with clear phlegm. Had cold sweats over night. Nausea and vomiting today.   Goal of Therapy:  Eradication of infection. Treatment with appropriate antibiotics  Plan Levaquin 750mg  IV q 24 hours. Follow up culture results  Gary [name] 03/26/2013,7:25 PM

## 2013-03-27 ENCOUNTER — Ambulatory Visit
Admission: RE | Admit: 2013-03-27 | Discharge: 2013-03-27 | Disposition: A | Discharge status: Home / Self Care | Payer: Medicare Other | Source: Ambulatory Visit | Attending: Radiation Oncology | Admitting: Radiation Oncology

## 2013-03-27 ENCOUNTER — Telehealth: Payer: Self-pay | Admitting: *Deleted

## 2013-03-27 ENCOUNTER — Encounter: Payer: Self-pay | Admitting: Radiation Oncology

## 2013-03-27 VITALS — BP 122/63 | HR 102 | Temp 97.5°F | Resp 20

## 2013-03-27 DIAGNOSIS — G8929 Other chronic pain: Secondary | ICD-10-CM

## 2013-03-27 DIAGNOSIS — M549 Dorsalgia, unspecified: Secondary | ICD-10-CM

## 2013-03-27 DIAGNOSIS — J189 Pneumonia, unspecified organism: Secondary | ICD-10-CM

## 2013-03-27 DIAGNOSIS — C062 Malignant neoplasm of retromolar area: Secondary | ICD-10-CM

## 2013-03-27 LAB — LEGIONELLA ANTIGEN, URINE: Legionella Antigen, Urine: NEGATIVE

## 2013-03-27 LAB — COMPREHENSIVE METABOLIC PANEL
ALT: 8 U/L (ref 0–53)
AST: 14 U/L (ref 0–37)
Albumin: 2.5 g/dL — ABNORMAL LOW (ref 3.5–5.2)
CO2: 26 mEq/L (ref 19–32)
Chloride: 107 mEq/L (ref 96–112)
Creatinine, Ser: 1.12 mg/dL (ref 0.50–1.35)
Potassium: 4.6 mEq/L (ref 3.5–5.1)
Sodium: 139 mEq/L (ref 135–145)
Total Bilirubin: 0.4 mg/dL (ref 0.3–1.2)

## 2013-03-27 LAB — CBC
MCV: 90.6 fL (ref 78.0–100.0)
Platelets: 164 10*3/uL (ref 150–400)
RBC: 3.63 MIL/uL — ABNORMAL LOW (ref 4.22–5.81)
RDW: 13.8 % (ref 11.5–15.5)
WBC: 7 10*3/uL (ref 4.0–10.5)

## 2013-03-27 LAB — STREP PNEUMONIAE URINARY ANTIGEN: Strep Pneumo Urinary Antigen: NEGATIVE

## 2013-03-27 MED ORDER — PREDNISONE 20 MG PO TABS
40.0000 mg | ORAL_TABLET | Freq: Every day | ORAL | Status: DC
  Administered 2013-03-28: 40 mg via ORAL
  Filled 2013-03-27 (×2): qty 2

## 2013-03-27 MED ORDER — BOOST PLUS PO LIQD
237.0000 mL | Freq: Three times a day (TID) | ORAL | Status: DC
  Administered 2013-03-27 – 2013-03-28 (×2): 237 mL via ORAL
  Filled 2013-03-27 (×4): qty 237

## 2013-03-27 MED ORDER — ALBUTEROL SULFATE (5 MG/ML) 0.5% IN NEBU: 2.5000 mg | INHALATION_SOLUTION | RESPIRATORY_TRACT | Status: DC | PRN

## 2013-03-27 MED ORDER — IPRATROPIUM BROMIDE 0.02 % IN SOLN: 0.5000 mg | RESPIRATORY_TRACT | Status: DC | PRN

## 2013-03-27 MED ORDER — SERTRALINE HCL 100 MG PO TABS
200.0000 mg | ORAL_TABLET | Freq: Every day | ORAL | Status: DC
  Filled 2013-03-27: qty 2

## 2013-03-27 MED ORDER — ADULT MULTIVITAMIN LIQUID CH
5.0000 mL | Freq: Every day | ORAL | Status: DC
  Administered 2013-03-27: 5 mL via ORAL
  Filled 2013-03-27 (×2): qty 5

## 2013-03-27 NOTE — Progress Notes (Signed)
Patient here weekly rad txs, inpatient 1412, in w/c, IVF's NS@50ml /hr, on telemetry, oxygen sats room air 100%, no c/o nausea, sob, no thick secretions in mouth, boost and ensure is all he's having right now,"Can't swallow any food , down, barely gets meds down" Weak and fatigued  2:35 PM

## 2013-03-27 NOTE — Progress Notes (Signed)
INITIAL NUTRITION ASSESSMENT  DOCUMENTATION CODES Per approved criteria  -Not Applicable   INTERVENTION: Provide Boost Plus TID, each supplement provides 360 kcal and 14 grams of protein. Provide Magic Cup once daily Provide Multivitamin with minerals daily  NUTRITION DIAGNOSIS: Inadequate oral intake related to swallowing difficulty as evidenced by 15% wt loss in less than 4 months.   Goal: Pt to meet >/= 90% of their estimated nutrition needs  Monitor:  Diet order PO intake Weight  Labs  Reason for Assessment: Consult for Assessment of nutrition requirements/status  72 y.o. 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 RD attempted to visit pt twice but, he was asleep and unresponsive. Pt had Ensure supplement on bedside table and consumed very little. Per chart pt was on full liquid diet PTA and he can't swallow anything solid. Weight history shows 27 lb wt loss in the past 4 months. Per RD note in medical chart pt was eating soft foods without difficulty one week ago and was drinking one Boost supplement daily; pt had been advised to drink Boost Plus 2-3 times daily.   Height: Ht Readings from Last 1 Encounters:  03/26/13 6' (1.829 m)    Weight: Wt Readings from Last 1 Encounters:  03/26/13 158 lb 8 oz (71.895 kg)    Ideal Body Weight: 178 lbs  % Ideal Body Weight: 89%  Wt Readings from Last 10 Encounters:  03/26/13 158 lb 8 oz  (71.895 kg)  03/22/13 163 lb 6.4 oz (74.118 kg)  03/12/13 162 lb (73.483 kg)  01/18/13 176 lb (79.833 kg)  01/02/13 175 lb (79.379 kg)  12/22/12 181 lb (82.1 kg)  12/22/12 181 lb (82.1 kg)  12/05/12 185 lb (83.915 kg)  11/23/12 183 lb 14.4 oz (83.416 kg)    Usual Body Weight: 185 lbs (based on weight history)  % Usual Body Weight: 85%  BMI:  Body mass index is 21.49 kg/(m^2).  Estimated Nutritional Needs: Kcal: 2160-2520 Protein: 86-100 grams Fluid: 2.2 L  Skin: WDL  Diet Order:    EDUCATION NEEDS: -No education needs identified at this time   Intake/Output Summary (Last 24 hours) at 03/27/13 1141 Last data filed at 03/27/13 0930  Gross per 24 hour  Intake 765.83 ml  Output    995 ml  Net -229.17 ml    Last BM: 5/18  Labs:   Recent Labs Lab 03/26/13 1420 03/26/13 1959 03/27/13 0515  NA 138 140 139  K 3.4* 4.6 4.6  CL 105 107 107  CO2 23 28 26   BUN 22 20 20   CREATININE 1.13 1.17 1.12  CALCIUM 9.1 9.1 9.5  MG  --  1.8  --   PHOS  --  2.9  --   GLUCOSE 113* 109* 151*    CBG (last 3)   Recent Labs  03/27/13 0751  GLUCAP 123*    Scheduled Meds: . sodium chloride   Intravenous STAT  . acyclovir  400 mg Oral BID  . albuterol  2.5 mg Nebulization Q6H WA  .  aspirin EC  81 mg Oral QHS  . bisoprolol  10 mg Oral Daily  . dorzolamide-timolol  1 drop Both Eyes BID  . feeding supplement  237 mL Oral TID BM  . ferrous sulfate  325 mg Oral TID PC  . ipratropium  0.5 mg Nebulization Q6H WA  . levofloxacin (LEVAQUIN) IV  750 mg Intravenous Q24H  . levothyroxine  200 mcg Oral QAC breakfast  . loratadine  10 mg Oral Daily  . magic mouthwash w/lidocaine  10 mL Oral TID  . methylPREDNISolone (SOLU-MEDROL) injection  60 mg Intravenous Q6H  . multivitamin with minerals  1 tablet Oral Daily  . pantoprazole  40 mg Oral Daily  . polyethylene glycol  17 g Oral Daily  . PRUTECT   Topical BID  . sertraline  100 mg Oral BID  . simvastatin  40 mg Oral QPM  .  sodium chloride  3 mL Intravenous Q12H  . zinc sulfate  220 mg Oral Daily    Continuous Infusions:   Past Medical History  Diagnosis Date  . Coronary artery disease     stent placement 2000, does not see cardiologist at this time  . Hypertension     sees Dr. Hart Carwin  . Thyroid disease   . Anxiety   . Sleep apnea     uses cpap machine  . Chronic kidney disease     renal insufficiency,  kidney  . GERD (gastroesophageal reflux disease)   . Arthritis     rhematoid arthritis  . Gout     pseudo gout  . Cancer     pre-cancerous lesions removed 11/22/12 and hx of  . Anemia     "chronic anemic"  . Hepatitis     hepatitis "C"  . Chronic back pain     hx of  . 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 Anxiety   . Hypertension   . Dyslipidemia   . Hypothyroid   . CAD (coronary artery disease)     s/p stenting  . Insomnia     cpap  . Full dentures   . Hearing impairment     job related, hbilat hearing aids    Past Surgical History  Procedure Laterality Date  . Tonsillectomy    . Coronary angioplasty  2000    stent placement 2000  . Dental extractions      upper and lower  . Hip pinning,cannulated Left 12/22/2012    Procedure: CANNULATED HIP PINNING;  Surgeon: Kathryne Hitch, MD;  Location: [facility] OR;  Service: Orthopedics;  Laterality: Left;  . Radical neck dissection Right 01/22/13    WF Stoney Bang RD, LDN Inpatient Clinical Dietitian Pager: [phone] After Hours Pager: [phone]

## 2013-03-27 NOTE — Evaluation (Signed)
Physical Therapy One Time Evaluation Patient Details Name: Gary [name] MRN: [id number] DOB: Feb 05, 1941 Today's Date: 03/27/2013 Time: 1308-6578 PT Time Calculation (min): 10 min  PT Assessment / Plan / Recommendation Clinical Impression  Pt is a 72 year old male admitted for acute COPD exacerbation and CAP and currently undergoing radiation for R retromolar trigone, squamous cell cancer.  Pt reports feeling much better today and currently modified independent with mobility.  SaO2 93% on room air at rest and during ambulation.  Pt denies SOB, weakness, and dizziness with mobility as well.  No acute PT needs identified at this time    PT Assessment  Patent does not need any further PT services    Follow Up Recommendations  No PT follow up    Does the patient have the potential to tolerate intense rehabilitation      Barriers to Discharge        Equipment Recommendations  None recommended by PT    Recommendations for Other Services     Frequency      Precautions / Restrictions     Pertinent Vitals/Pain n/a     Mobility  Bed Mobility Bed Mobility: Supine to Sit;Sit to Supine Supine to Sit: 7: Independent Sit to Supine: 7: Independent Transfers Transfers: Sit to Stand;Stand to Sit Sit to Stand: 6: Modified independent (Device/Increase time);From bed;With upper extremity assist Stand to Sit: 6: Modified independent (Device/Increase time);To bed;Without upper extremity assist Ambulation/Gait Ambulation/Gait Assistance: 5: Supervision Ambulation Distance (Feet): 300 Feet Assistive device: None Ambulation/Gait Assistance Details: slight "limp" due to hip fx in Feb per pt, no unsteady gait or LOB observed, SaO2 remained 93% on room air during ambulation Gait Pattern: Antalgic;Step-through pattern Gait velocity: WNL    Exercises     PT Diagnosis:    PT Problem List:   PT Treatment Interventions:     PT Goals    Visit Information  Last PT Received On:  03/27/13 Assistance Needed: +1    Subjective Data  Subjective: I'm feeling much better.   Prior Functioning  Home Living Lives With: Spouse Type of Home: House Home Access: Level entry Home Layout: One level Home Adaptive Equipment: Walker - rolling Prior Function Level of Independence: Independent Communication Communication: No difficulties    Cognition  Cognition Arousal/Alertness: Awake/alert Behavior During Therapy: WFL for tasks assessed/performed Overall Cognitive Status: Within Functional Limits for tasks assessed    Extremity/Trunk Assessment Right Upper Extremity Assessment RUE ROM/Strength/Tone: [facility] for tasks assessed Left Upper Extremity Assessment LUE ROM/Strength/Tone: WFL for tasks assessed Right Lower Extremity Assessment RLE ROM/Strength/Tone: [facility] for tasks assessed Left Lower Extremity Assessment LLE ROM/Strength/Tone: WFL for tasks assessed   Balance    End of Session PT - End of Session Activity Tolerance: Patient tolerated treatment well Patient left: in bed;with call bell/phone within reach  GP     [facility] E 03/27/2013, 10:54 AM Zenovia Jarred, PT, DPT 03/27/2013 Pager: [phone]

## 2013-03-27 NOTE — Progress Notes (Addendum)
[facility] PROGRESS NOTE  Gary [name]:[id number] DOB: 1941-01-25 DOA: 03/26/2013 PCP: [name], MD  Assessment/Plan:  COPD with acute exacerbation  - improved, will change the solumedrol to po prednisone 40 mg po daily - will provide supportive care with nebulizers scheduled and as needed, oxygen via [place], empiric ABX  - urine legionella is negative and strep pneumo is negative  Active Problems:  CAP (community acquired pneumonia)  - not evident of CXR but clinically appears to be associated with acute COPD - will treat with empiric ABX as noted above  - sputum analysis ordered   Chronic kidney disease. stage 2  - based on GFR but creatinine is stable and within normal limits  - BMP in AM   GERD (gastroesophageal reflux disease)  - Stable, continue Protonix   Nutrition - patient has been able to take liquid diet -Will start him on full liquid diet   Anemia  - Hg and Hct stable and at pt's baseline  Hypothyroid  - continue synthroid   Chronic back pain  - analgesia as needed   Paroxysmal a-fib  - pt currently in sinus rhythm, he denies long term anticoagulation use  - pt explains he has been on xarelto in the past after hip fracture, post operatively  - confirmed with his pharmacy that no xarelto was refilled since 12/2012  - monitor on telemetry    Code Status: Full   Disposition Plan: Possible d/c home in am        Antibiotics:  Levaquin 5/20 >>  HPI/Subjective: Patient seen and examined, breathing better.  Objective: Filed Vitals:   03/27/13 0522 03/27/13 0818 03/27/13 1506 03/27/13 1546  BP: 109/55  118/59   Pulse: 53  50   Temp: 97.7 F (36.5 C)  97.8 F (36.6 C)   TempSrc: Oral  Oral   Resp: 19  20   Height:      Weight:      SpO2: 99% 92% 100% 100%    Intake/Output Summary (Last 24 hours) at 03/27/13 1619 Last data filed at 03/27/13 1526  Gross per 24 hour  Intake 765.83 ml  Output   1195 ml  Net -429.17  ml   Filed Weights   03/26/13 1905  Weight: 71.895 kg (158 lb 8 oz)    Exam:   General: appear in no acute distress   Cardiovascular: s1s2 RRR  Respiratory: Clear bilaterally  Abdomen: soft, nontender  Musculoskeletal: No edema of lower extremities  Data Reviewed: Basic Metabolic Panel:  Recent Labs Lab 03/26/13 1420 03/26/13 1959 03/27/13 0515  NA 138 140 139  K 3.4* 4.6 4.6  CL 105 107 107  CO2 23 28 26   GLUCOSE 113* 109* 151*  BUN 22 20 20   CREATININE 1.13 1.17 1.12  CALCIUM 9.1 9.1 9.5  MG  --  1.8  --   PHOS  --  2.9  --    Liver Function Tests:  Recent Labs Lab 03/26/13 1420 03/26/13 1959 03/27/13 0515  AST 16 14 14   ALT 7 7 8   ALKPHOS 63 59 57  BILITOT 0.4 0.4 0.4  PROT 6.3 6.1 6.5  ALBUMIN 2.7* 2.5* 2.5*    Recent Labs Lab 03/26/13 1420  LIPASE 30   No results found for this basename: AMMONIA,  in the last 168 hours CBC:  Recent Labs Lab 03/26/13 1420 03/26/13 1959 03/27/13 0515  WBC 7.7 8.1 7.0  NEUTROABS 6.6 6.7  --   HGB 11.0* 10.4* 10.3*  HCT 33.2* 31.6* 32.9*  MCV 90.0 90.8 90.6  PLT 153 146* 164   Cardiac Enzymes: No results found for this basename: CKTOTAL, CKMB, CKMBINDEX, TROPONINI,  in the last 168 hours BNP (last 3 results) No results found for this basename: PROBNP,  in the last 8760 hours CBG:  Recent Labs Lab 03/27/13 0751  GLUCAP 123*    No results found for this or any previous visit (from the past 240 hour(s)).   Studies: Dg Chest Portable 1 View  03/26/2013   *RADIOLOGY REPORT*  Clinical Data: Nausea, emesis, coronary artery disease  PORTABLE CHEST - 1 VIEW  Comparison: 12/21/2012  Findings: Skin folds project over the apices bilaterally.  No new pulmonary opacity.  Hyperaeration and coarsened lung markings suggest COPD.  Heart size is normal.  Right AC joint degenerative change noted.  IMPRESSION: Change of COPD without focal acute finding.   Original Report Authenticated By: Christiana Pellant, M.D.     Scheduled Meds: . sodium chloride   Intravenous STAT  . acyclovir  400 mg Oral BID  . aspirin EC  81 mg Oral QHS  . bisoprolol  10 mg Oral Daily  . dorzolamide-timolol  1 drop Both Eyes BID  . ferrous sulfate  325 mg Oral TID PC  . lactose free nutrition  237 mL Oral TID WC  . levofloxacin (LEVAQUIN) IV  750 mg Intravenous Q24H  . levothyroxine  200 mcg Oral QAC breakfast  . loratadine  10 mg Oral Daily  . magic mouthwash w/lidocaine  10 mL Oral TID  . methylPREDNISolone (SOLU-MEDROL) injection  60 mg Intravenous Q6H  . multivitamin  5 mL Oral QHS  . pantoprazole  40 mg Oral Daily  . polyethylene glycol  17 g Oral Daily  . PRUTECT   Topical BID  . [START ON 03/28/2013] sertraline  200 mg Oral QHS  . simvastatin  40 mg Oral QPM  . sodium chloride  3 mL Intravenous Q12H  . zinc sulfate  220 mg Oral Daily   Continuous Infusions:   Principal Problem:   COPD with acute exacerbation Active Problems:   Coronary artery disease   Anxiety   Sleep apnea   Chronic kidney disease. stage 2   GERD (gastroesophageal reflux disease)   Anemia   Chronic back pain   Hypothyroid   CAP (community acquired pneumonia)    Time spent: 30 min    [facility]  [facility] Pager [phone] 7PM-7AM, please contact night-coverage at [email or web address], password [facility] 03/27/2013, 4:19 PM  LOS: 1 day

## 2013-03-27 NOTE — Care Management Note (Addendum)
    Page 1 of 1   03/28/2013     1:35:21 PM   CARE MANAGEMENT NOTE 03/28/2013  Patient:  Gary [name], Gary [name]   Account Number:  [id number]  Date Initiated:  03/27/2013  Documentation initiated by:  [facility]  Subjective/Objective Assessment:   ADMITTED W/SOB.COPD.hx:ORAL CANCER.     Action/Plan:   FROM HOME W/SPOUSE-PATIENT IS HER CAREGIVER.HAS PCP,PHARMACY.HAS CPAP-NOT IN USE.   Anticipated DC Date:  03/28/2013   Anticipated DC Plan:  HOME/SELF CARE      DC Planning Services  CM consult      Choice offered to / List presented to:             Status of service:  Completed, signed off Medicare Important Message given?   (If response is "NO", the following Medicare IM given date fields will be blank) Date Medicare IM given:   Date Additional Medicare IM given:    Discharge Disposition:  HOME/SELF CARE  Per UR Regulation:  Reviewed for med. necessity/level of care/duration of stay  If discussed at Long Length of Stay Meetings, dates discussed:    Comments:  03/28/13 [facility] RN,BSN NCM [phone] [facility] SW PROVIDED PATIENT W/PANTS.PATIENT HAS OWN TRANSPORTATION.NO HH NEEDS, OR ORDERS.  03/27/13 Lynora Dymond RN,BSN NCM [phone] PT-NO F/U.COPD GOLD PROTOCAL.PATIENT STATES HE GOES TO CANCER CENTER.HE MAY NEED TRANSPORTATION.HE WOULD LIKE SOME SCRUBS ALSO @ D/C.I HAVE LEFT MESSAGE W/[facility] SW,AWAIT CALL BACK.

## 2013-03-27 NOTE — Telephone Encounter (Signed)
Called the 4th floor spoke with patient's nurse,Julia,RN, patient time is changed tomorrow at 9:00 am in case he is d/c from hospital, MD wanted to make sure he had rad tx , RN thanked [place] for the call and will make note of it,time changed  Already  Per Hether/Christie,therapists 2:56 PM

## 2013-03-27 NOTE — Evaluation (Signed)
Occupational Therapy Evaluation Patient Details Name: Gary [name] MRN: [id number] DOB: Jan 25, 1941 Today's Date: 03/27/2013 Time: 4098-1191 OT Time Calculation (min): 25 min  OT Assessment / Plan / Recommendation Clinical Impression  Pt admitted with COPD exacerbation and CAP in the setting of R retromolar trigone, squamous cell cancer in which is is currently undergoing radiation therapy.  Pt is moving well and able to perform his ADL.  No further OT needs.  Pt voicing concern about having difficulty caring for himself and his wife.  Discussed community resources, but needs SW to address.    OT Assessment  Patient does not need any further OT services    Follow Up Recommendations  No OT follow up    Barriers to Discharge      Equipment Recommendations  None recommended by OT    Recommendations for Other Services    Frequency       Precautions / Restrictions     Pertinent Vitals/Pain No pain, on RA.    ADL  Eating/Feeding: Independent Where Assessed - Eating/Feeding: Edge of bed Grooming: Wash/dry hands;Modified independent Where Assessed - Grooming: Unsupported standing Upper Body Bathing: Modified independent Where Assessed - Upper Body Bathing: Unsupported sitting Lower Body Bathing: Modified independent Where Assessed - Lower Body Bathing: Supported sit to stand Upper Body Dressing: Independent Where Assessed - Upper Body Dressing: Unsupported sitting Lower Body Dressing: Independent Where Assessed - Lower Body Dressing: Supported sit to stand;Unsupported sitting Toilet Transfer: Modified independent Toilet Transfer Method: Sit to Barista: Comfort height toilet Toileting - Clothing Manipulation and Hygiene: Independent Where Assessed - Toileting Clothing Manipulation and Hygiene: Sit on 3-in-1 or toilet Transfers/Ambulation Related to ADLs: Pt ambulated holding IV pole. ADL Comments: Pt educated in energy conservation.  Has a tub seat and  grab bars available to him if he needs to sit to bathe.    OT Diagnosis:    OT Problem List:   OT Treatment Interventions:     OT Goals    Visit Information  Last OT Received On: 03/27/13 Assistance Needed: +1    Subjective Data  Subjective: "My wife is worse off than me." Patient Stated Goal: Be able to care for himself and his wife at home.   Prior Functioning     Home Living Lives With: Spouse (wife has cancer) Available Help at Discharge: Friend(s);Available PRN/intermittently;Neighbor Type of Home: House Home Access: Level entry Home Layout: One level Bathroom Shower/Tub: Associate Professor: No Home Adaptive Equipment: Bedside commode/3-in-1;Shower chair with back;Grab bars in shower;Hand-held shower hose Additional Comments: equipment is his wife's Prior Function Level of Independence: Independent Able to Take Stairs?: Yes Driving: Yes Vocation: Retired Musician: No difficulties Dominant Hand: Right         Vision/Perception Vision - History Patient Visual Report: No change from baseline   Cognition  Cognition Arousal/Alertness: Awake/alert Behavior During Therapy: WFL for tasks assessed/performed Overall Cognitive Status: Within Functional Limits for tasks assessed    Extremity/Trunk Assessment Right Upper Extremity Assessment RUE ROM/Strength/Tone: [facility] for tasks assessed Left Upper Extremity Assessment LUE ROM/Strength/Tone: WFL for tasks assessed  Trunk Assessment Trunk Assessment: Normal     Mobility Bed Mobility Bed Mobility: Supine to Sit;Sit to Supine Supine to Sit: 7: Independent Sit to Supine: 7: Independent Transfers Transfers: Sit to Stand;Stand to Sit Sit to Stand: 6: Modified independent (Device/Increase time);From bed;With upper extremity assist;From toilet Stand to Sit: 6: Modified independent (Device/Increase time);To bed;Without upper extremity assist;To  toilet  Exercise     Balance     End of Session OT - End of Session Activity Tolerance: Patient tolerated treatment well Patient left: in bed;with call bell/phone within reach;with nursing in room Nurse Communication:  (infusion complete)  GO     Evern Bio 03/27/2013, 2:00 PM [phone]

## 2013-03-27 NOTE — Progress Notes (Signed)
Weekly Management Note Current Dose:24 Gy  Projected Dose:60 Gy   Narrative:  The patient presents for routine under treatment assessment.  CBCT/MVCT images/Port film x-rays were reviewed.  The chart was checked. He was hospitalized yesterday with complaints of shortness of breath. Also dehydrations secondary to odynophagia.  Has not been taking pain medications regularly and has noted some symptoms of cough when he swallows (? Aspiration). Feels much better today.   Physical Findings:  Unchanged. Dry right neck. No dermatitis.   Vitals:  Filed Vitals:   03/27/13 1432  BP: 122/63  Pulse: 102  Temp: 97.5 F (36.4 C)  Resp: 20   Weight:  Wt Readings from Last 3 Encounters:  03/26/13 158 lb 8 oz (71.895 kg)  03/22/13 163 lb 6.4 oz (74.118 kg)  03/12/13 162 lb (73.483 kg)   Lab Results  Component Value Date   WBC 7.0 03/27/2013   HGB 10.3* 03/27/2013   HCT 32.9* 03/27/2013   MCV 90.6 03/27/2013   PLT 164 03/27/2013   Lab Results  Component Value Date   CREATININE 1.12 03/27/2013   BUN 20 03/27/2013   NA 139 03/27/2013   K 4.6 03/27/2013   CL 107 03/27/2013   CO2 26 03/27/2013     Impression:  The patient is tolerating radiation.  Plan:  Continue treatment as planned. Encouraged him to schedule pain medications tid to enable him to tolerate po better. Will ask speech and dietary to follow up as an oupatient.  If he cannot maintain oral intake, may need nasogastric tube placement but I really hope he will be able to maintain himself orally.

## 2013-03-27 NOTE — Progress Notes (Signed)
Spoke with pt about CPAP order prn, refusing at this time. RT will assist as needed.

## 2013-03-27 NOTE — Telephone Encounter (Signed)
Called the floor, patient in 1412, spoke with RN Amil Amen, he is on telemetry,IVF's NS @ 50cc/hr, , Oxygen, he is up and ambulating in the bathroom, can come for radiation today, and can come in w/c, he is doing better on antibiotics, nebulizers, , his rad tx time is scheduled for 2pm and transporter will come and get him, thanked Charity fundraiser and called linac #2, to inform paatient can come for radiation at scheduled time today

## 2013-03-28 ENCOUNTER — Encounter: Payer: Medicare Other | Admitting: Nutrition

## 2013-03-28 ENCOUNTER — Ambulatory Visit
Admission: RE | Admit: 2013-03-28 | Discharge: 2013-03-28 | Disposition: A | Discharge status: Home / Self Care | Payer: Medicare Other | Source: Ambulatory Visit | Attending: Radiation Oncology | Admitting: Radiation Oncology

## 2013-03-28 ENCOUNTER — Encounter: Payer: Self-pay | Admitting: Nutrition

## 2013-03-28 DIAGNOSIS — J9600 Acute respiratory failure, unspecified whether with hypoxia or hypercapnia: Secondary | ICD-10-CM

## 2013-03-28 LAB — URINE CULTURE
Colony Count: NO GROWTH
Culture: NO GROWTH

## 2013-03-28 LAB — GLUCOSE, CAPILLARY: Glucose-Capillary: 100 mg/dL — ABNORMAL HIGH (ref 70–99)

## 2013-03-28 MED ORDER — IPRATROPIUM-ALBUTEROL 18-103 MCG/ACT IN AERO: 2.0000 | INHALATION_SPRAY | Freq: Four times a day (QID) | RESPIRATORY_TRACT | Status: DC | PRN

## 2013-03-28 MED ORDER — RIVAROXABAN 20 MG PO TABS: 20.0000 mg | ORAL_TABLET | Freq: Every day | ORAL | Status: DC

## 2013-03-28 MED ORDER — LEVOFLOXACIN 750 MG PO TABS: 750.0000 mg | ORAL_TABLET | ORAL | Status: DC

## 2013-03-28 MED ORDER — LEVOFLOXACIN 750 MG PO TABS
750.0000 mg | ORAL_TABLET | ORAL | Status: DC
  Filled 2013-03-28: qty 1

## 2013-03-28 MED ORDER — PREDNISONE 10 MG PO TABS: ORAL_TABLET | ORAL | Status: DC

## 2013-03-28 NOTE — Discharge Summary (Signed)
Physician Discharge Summary  Gary [name] BJY:[id number] DOB: [birth year] DOA: 03/26/2013  PCP: [name], MD  Admit date: 03/26/2013 Discharge date: 03/28/2013  Time spent: 50* minutes  Recommendations for Outpatient Follow-up:  1. Follow up PCP in 2 weeks  Discharge Diagnoses:  Principal Problem:   COPD with acute exacerbation Active Problems:   Coronary artery disease   Anxiety   Sleep apnea   Chronic kidney disease. 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 Pt denies similar events in the past. He denies chest pain, no specific urinary concerns.  In ED, PT with hypoxia in 70's, responded well to nebulizer treatment and [place], oxygen saturations increased to mid 90's. [facility] asked to admit for further evaluation of COPD management.    Hospital Course:  COPD with acute exacerbation  - Improved with steroids, nebulizers, antibiotics. - Will discharge on prednisone taper and levaquin. Will also give combivent inhaler for dyspnea. - urine legionella is negative and strep pneumo is negative   CAP (community acquired pneumonia)  - not evident of CXR but clinically  appears to be associated with acute COPD - patient has improved with levaquin, clinically improved with antibiotics, will discharge on 8 more days of levaquin.  Chronic kidney disease. stage 2  - based on GFR but creatinine is stable and within normal limits    GERD (gastroesophageal reflux disease)  - Stable, continue Protonix   Nutrition  - patient has been able to take liquid diet  -Will start him on full liquid diet   Anemia  - Hg and Hct stable and at pt's baseline   Hypothyroid  - continue synthroid   Chronic back pain  - analgesia as needed   Paroxysmal a-fib  - [facility] and was prescribed xarelto for a fib, called the PA Arlys John from [facility] and confirmed that he was prescribed xarelto in February for atrial fibrillation. And it was not stoppe, will restart xarelto at this time.   Procedures:  None  Consultations:  None  Discharge Exam: Filed Vitals:   03/27/13 1506 03/27/13 1546 03/27/13 2032 03/28/13 0551  BP: 118/59  106/53 118/71  Pulse: 50  57 58  Temp: 97.8 F (36.6 C)  98.3 F (36.8 C) 98 F (36.7 C)  TempSrc: Oral  Oral Oral  Resp: 20  20 20   Height:      Weight:    73.1 kg (161 lb 2.5 oz)  SpO2: 100% 100% 96% 93%    General: Appear in no acute distress Cardiovascular: s1s2 RRR Respiratory: Clear bilaterally Ext: No edema  Discharge Instructions  Discharge Orders   Future Appointments Provider Department Dept Phone   03/28/2013  2:30 PM Anabel Bene, RD [facility] [phone]   03/29/2013 2:05 PM [facility] [id number] [facility] [phone]   04/02/2013 2:00 PM [facility] [id number] [place] [facility] [phone]   04/03/2013 2:05 PM [facility] [id number] [place] [phone]   04/04/2013 2:05 PM [facility] [id number] [facility] [phone]    04/05/2013 2:05 PM [facility] [id number] [facility] [phone]   04/08/2013 2:05 PM [facility] [id number] [facility] [phone]   04/09/2013 2:05 PM [facility] [id number] [facility] [phone]   04/10/2013 2:05 PM [facility] [id number] [facility] [phone]   04/11/2013 2:05 PM [facility] [id number] [facility] [phone]   04/12/2013 2:05 PM [facility] [id number] [facility] [phone]   04/15/2013 2:05 PM [facility] [id number] [place] [facility] [phone]   04/16/2013 2:05 PM [facility] [id number] [place] [facility] [phone]   04/17/2013 2:05 PM [facility] [id number] [facility] [phone]   04/18/2013 2:05 PM [facility] [id number] [place] [facility] [phone]   04/19/2013 2:05 PM [facility] [id number] [place] [facility] [phone]   04/22/2013 2:05 PM [facility] [id number] [facility] [phone]   04/23/2013 2:05 PM [facility] [id number] [place] [facility] [phone]   Future Orders Complete By Expires     Diet - low sodium heart healthy  As directed     Increase activity slowly  As directed         Medication List    STOP taking these medications       HYDROcodone-acetaminophen 7.5-325 mg/15 ml solution  Commonly known as:  HYCET      TAKE these medications       acyclovir 400 MG tablet  Commonly known as:  ZOVIRAX  Take 400 mg by mouth 2 (two) times daily.     albuterol-ipratropium 18-103 MCG/ACT inhaler  Commonly known as:  COMBIVENT  Inhale 2 puffs into the lungs every 6 (six) hours as needed for wheezing.     ALPRAZolam 0.5 MG tablet  Commonly known as:  XANAX  Take  0.5 mg by mouth 3 (three) times daily as needed for sleep.     aspirin EC 81 MG tablet  Take 81 mg by mouth at bedtime.     bisoprolol 5 MG tablet  Commonly known as:  ZEBETA  Take 2 tablets (10 mg total) by mouth daily.     cetirizine 10 MG tablet  Commonly known as:  ZYRTEC  Take 10 mg by mouth daily.     desonide 0.05 % cream  Commonly known as:  DESOWEN  Apply 1 application topically 2 (two) times daily. Applied to face and behind ears.     Dexamethasone 0.7 MG Impl  Place 1 each into both eyes every 6 (six) months.     diazepam 5 MG tablet  Commonly known as:  VALIUM  Take 1 tablet (5 mg total) by mouth every 12 (twelve) hours as needed for anxiety. For anxiety     dorzolamide-timolol 22.3-6.8 MG/ML ophthalmic solution  Commonly known as:  COSOPT  Place 1 drop into both eyes 2 (two) times daily.     emollient cream  Commonly known as:  BIAFINE  Apply 1 application topically 2 (two) times daily. Apply to  right side neck after rad tx and bedtime,/prn, except not 4 hours prior to rad txs, apply daily     feeding supplement Liqd  Take 237 mLs by mouth 3 (three) times daily between meals.     ferrous sulfate 325 (65 FE) MG tablet  Take 1 tablet (325 mg total) by mouth 3 (three) times daily after meals.     levofloxacin 750 MG tablet  Commonly known as:  LEVAQUIN  Take 1 tablet (750 mg total) by mouth daily.     levothyroxine 200 MCG tablet  Commonly known as:  SYNTHROID, LEVOTHROID  Take 200 mcg by mouth daily.     magic mouthwash w/lidocaine Soln  Swish and spit 10 mLs 3 (three) times daily.     meclizine 25 MG tablet  Commonly known as:  ANTIVERT  Take 25 mg by mouth every 12 (twelve) hours as needed for dizziness.     multivitamin with minerals Tabs  Take 1 tablet by mouth daily.     oxyCODONE 15 MG immediate release tablet  Commonly known as:  ROXICODONE  Take 15 mg by mouth every 4 (four) hours as needed for pain.     pantoprazole 40 MG tablet   Commonly known as:  PROTONIX  Take 40 mg by mouth daily.     polyethylene glycol packet  Commonly known as:  MIRALAX / GLYCOLAX  Take 17 g by mouth daily.     predniSONE 10 MG tablet  Commonly known as:  DELTASONE  Prednisone 40 mg po daily x 1 day then  Prednisone 30 mg po daily x 1 day then  Prednisone 20 mg po daily x 1 day then  Prednisone 10 mg daily x 1 day then   Continue taking your usual dose of Prednisone 5 mg po daily as prescribed.     predniSONE 5 MG tablet  Commonly known as:  DELTASONE  Take 5 mg by mouth daily.     Rivaroxaban 20 MG Tabs  Commonly known as:  XARELTO  Take 1 tablet (20 mg total) by mouth daily.     sertraline 100 MG tablet  Commonly known as:  ZOLOFT  Take 100 mg by mouth 2 (two) times daily.     simvastatin 40 MG tablet  Commonly known as:  ZOCOR  Take 40 mg by mouth every evening.     Vitamin D 1000 UNITS capsule  Take 1,000 Units by mouth daily.     Zinc 30 MG Caps  Take 1 capsule by mouth daily.     zolpidem 10 MG tablet  Commonly known as:  AMBIEN  Take 5-10 mg by mouth at bedtime as needed. For sleep       Allergies  Allergen Reactions  . Cellcept (Mycophenolate Mofetil) Other (See Comments)    Reaction unknown  . Colcrys (Colchicine) Other (See Comments)    myalgias  . Erythromycin Other (See Comments)    Reaction unknown  . Methotrexate Derivatives Hives, Itching and Rash    Only in tablet form  . Neosporin (Neomycin-Bacitracin Zn-Polymyx) Rash  . Ultram (Tramadol) Rash      The results of significant diagnostics from this hospitalization (including imaging, microbiology, ancillary and laboratory) are listed below for reference.    Significant Diagnostic Studies: Dg Chest Portable 1 View  03/26/2013   *RADIOLOGY REPORT*  Clinical Data: Nausea, emesis, coronary artery disease  PORTABLE CHEST - 1 VIEW  Comparison: 12/21/2012  Findings: Skin folds project over the apices bilaterally.  No  new pulmonary opacity.   Hyperaeration and coarsened lung markings suggest COPD.  Heart size is normal.  Right AC joint degenerative change noted.  IMPRESSION: Change of COPD without focal acute finding.   Original Report Authenticated By: Christiana Pellant, M.D.    Microbiology: Recent Results (from the past 240 hour(s))  CULTURE, BLOOD (ROUTINE X 2)     Status: None   Collection Time    03/26/13  2:42 PM      Result Value Range Status   Specimen Description BLOOD RIGHT FOREARM   Final   Special Requests BOTTLES DRAWN AEROBIC AND ANAEROBIC 5CC   Final   Culture  Setup Time 03/27/2013 00:16   Final   Culture     Final   Value:        BLOOD CULTURE RECEIVED NO GROWTH TO DATE CULTURE WILL BE HELD FOR 5 DAYS BEFORE ISSUING A FINAL NEGATIVE REPORT   Report Status PENDING   Incomplete  CULTURE, BLOOD (ROUTINE X 2)     Status: None   Collection Time    03/26/13  2:47 PM      Result Value Range Status   Specimen Description BLOOD RIGHT HAND   Final   Special Requests BOTTLES DRAWN AEROBIC AND ANAEROBIC 5CC   Final   Culture  Setup Time 03/27/2013 00:18   Final   Culture     Final   Value:        BLOOD CULTURE RECEIVED NO GROWTH TO DATE CULTURE WILL BE HELD FOR 5 DAYS BEFORE ISSUING A FINAL NEGATIVE REPORT   Report Status PENDING   Incomplete     Labs: Basic Metabolic Panel:  Recent Labs Lab 03/26/13 1420 03/26/13 1959 03/27/13 0515  NA 138 140 139  K 3.4* 4.6 4.6  CL 105 107 107  CO2 23 28 26   GLUCOSE 113* 109* 151*  BUN 22 20 20   CREATININE 1.13 1.17 1.12  CALCIUM 9.1 9.1 9.5  MG  --  1.8  --   PHOS  --  2.9  --    Liver Function Tests:  Recent Labs Lab 03/26/13 1420 03/26/13 1959 03/27/13 0515  AST 16 14 14   ALT 7 7 8   ALKPHOS 63 59 57  BILITOT 0.4 0.4 0.4  PROT 6.3 6.1 6.5  ALBUMIN 2.7* 2.5* 2.5*    Recent Labs Lab 03/26/13 1420  LIPASE 30   No results found for this basename: AMMONIA,  in the last 168 hours CBC:  Recent Labs Lab 03/26/13 1420 03/26/13 1959 03/27/13 0515   WBC 7.7 8.1 7.0  NEUTROABS 6.6 6.7  --   HGB 11.0* 10.4* 10.3*  HCT 33.2* 31.6* 32.9*  MCV 90.0 90.8 90.6  PLT 153 146* 164   Cardiac Enzymes: No results found for this basename: CKTOTAL, CKMB, CKMBINDEX, TROPONINI,  in the last 168 hours BNP: BNP (last 3 results) No results found for this basename: PROBNP,  in the last 8760 hours CBG:  Recent Labs Lab 03/27/13 0751 03/28/13 0736  GLUCAP 123* 100*       Signed:  Prather Failla S  [facility] 03/28/2013, 11:26 AM

## 2013-03-28 NOTE — Progress Notes (Signed)
Patient did not show up for scheduled nutrition follow-up. 

## 2013-03-29 ENCOUNTER — Ambulatory Visit
Admission: RE | Admit: 2013-03-29 | Discharge: 2013-03-29 | Disposition: A | Discharge status: Home / Self Care | Payer: Medicare Other | Source: Ambulatory Visit | Attending: Radiation Oncology | Admitting: Radiation Oncology

## 2013-03-29 NOTE — Progress Notes (Signed)
Spoke with Gary [name] back therapist area, he is doing much better stated, knew he missed B.Neff appt but was out of the house when she called, informed him we would make an appt for him  Again, he said he didn't need to see MD today he's good, appt made with b. Neff for 04/11/13 @ 2:30pm, earliest time fram e 2:54 PM

## 2013-04-02 ENCOUNTER — Ambulatory Visit
Admission: RE | Admit: 2013-04-02 | Discharge: 2013-04-02 | Disposition: A | Discharge status: Home / Self Care | Payer: Medicare Other | Source: Ambulatory Visit | Attending: Radiation Oncology | Admitting: Radiation Oncology

## 2013-04-02 ENCOUNTER — Encounter: Payer: Self-pay | Admitting: Radiation Oncology

## 2013-04-02 VITALS — BP 132/68 | HR 53 | Temp 98.4°F | Resp 20 | Wt 161.4 lb

## 2013-04-02 DIAGNOSIS — C062 Malignant neoplasm of retromolar area: Secondary | ICD-10-CM

## 2013-04-02 LAB — CULTURE, BLOOD (ROUTINE X 2)
Culture: NO GROWTH
Culture: NO GROWTH

## 2013-04-02 MED ORDER — FLUCONAZOLE 100 MG PO TABS: 100.0000 mg | ORAL_TABLET | Freq: Every day | ORAL | Status: DC

## 2013-04-02 NOTE — Progress Notes (Signed)
Weekly Management Note Current Dose:30 Gy  Projected Dose:60 Gy   Narrative:  The patient presents for routine under treatment assessment.  CBCT/MVCT images/Port film x-rays were reviewed.  The chart was checked. Pulled about an inch of cotton/gauze from incision near his cheek last week. Included impacted food. Cleared up his headache and he feels much better. Can eat and swallow better. Some rough patches on his tongue. Appetite good.   Physical Findings:  Slightly pink right neck.  I cannot see any open areas in his madibular area or oropharynx.  There is thrush over his tongue and oropharynx.   Vitals:  Filed Vitals:   04/02/13 1447  BP: 132/68  Pulse: 53  Temp: 98.4 F (36.9 C)  Resp: 20   Weight:  Wt Readings from Last 3 Encounters:  04/02/13 161 lb 6.4 oz (73.211 kg)  03/28/13 161 lb 2.5 oz (73.1 kg)  03/22/13 163 lb 6.4 oz (74.118 kg)   Lab Results  Component Value Date   WBC 7.0 03/27/2013   HGB 10.3* 03/27/2013   HCT 32.9* 03/27/2013   MCV 90.6 03/27/2013   PLT 164 03/27/2013   Lab Results  Component Value Date   CREATININE 1.12 03/27/2013   BUN 20 03/27/2013   NA 139 03/27/2013   K 4.6 03/27/2013   CL 107 03/27/2013   CO2 26 03/27/2013     Impression:  The patient is tolerating radiation.  Plan:  Continue treatment as planned. Unclear etiology of "gauze" but he is feeling better.  Add diflucan for thrush.

## 2013-04-02 NOTE — Progress Notes (Signed)
patient here weekly rad tx, 15 completed, slight erythmea on neck, using biafine cream bid, patient stated he pulled out gause from right lower jaw,, it had healed over, and food was imnpacted in it as well, patient doing a lot better no c/o swallowing difficuty at present, drinks ensure and eats soft foods, drinks oplenty water, no c/o nausea, , slight fatigue,  2:50 PM

## 2013-04-03 ENCOUNTER — Ambulatory Visit
Admission: RE | Admit: 2013-04-03 | Discharge: 2013-04-03 | Disposition: A | Discharge status: Home / Self Care | Payer: Medicare Other | Source: Ambulatory Visit | Attending: Radiation Oncology | Admitting: Radiation Oncology

## 2013-04-03 NOTE — ED Provider Notes (Signed)
Medical screening examination/treatment/procedure(s) were conducted as a shared visit with non-physician practitioner(s) and myself.  I personally evaluated the patient during the encounter.  72yM with likely copd exacerbation. Possibly some component of radiation pneumonitis. CXR without focal infiltrate but some benefit in abx in copd exacerbations. Given degree of distress, will admit.   Raeford Razor, MD 04/03/13 [phone]

## 2013-04-04 ENCOUNTER — Ambulatory Visit
Admission: RE | Admit: 2013-04-04 | Discharge: 2013-04-04 | Disposition: A | Discharge status: Home / Self Care | Payer: Medicare Other | Source: Ambulatory Visit | Attending: Radiation Oncology | Admitting: Radiation Oncology

## 2013-04-05 ENCOUNTER — Ambulatory Visit
Admission: RE | Admit: 2013-04-05 | Discharge: 2013-04-05 | Disposition: A | Discharge status: Home / Self Care | Payer: Medicare Other | Source: Ambulatory Visit | Attending: Radiation Oncology | Admitting: Radiation Oncology

## 2013-04-08 ENCOUNTER — Ambulatory Visit
Admission: RE | Admit: 2013-04-08 | Discharge: 2013-04-08 | Disposition: A | Discharge status: Home / Self Care | Payer: Medicare Other | Source: Ambulatory Visit | Attending: Radiation Oncology | Admitting: Radiation Oncology

## 2013-04-08 ENCOUNTER — Telehealth: Payer: Self-pay | Admitting: Dietician

## 2013-04-09 ENCOUNTER — Ambulatory Visit
Admission: RE | Admit: 2013-04-09 | Discharge: 2013-04-09 | Disposition: A | Discharge status: Home / Self Care | Payer: Medicare Other | Source: Ambulatory Visit | Attending: Radiation Oncology | Admitting: Radiation Oncology

## 2013-04-09 ENCOUNTER — Encounter: Payer: Self-pay | Admitting: Radiation Oncology

## 2013-04-09 VITALS — BP 133/83 | HR 56 | Temp 98.2°F | Resp 20 | Wt 157.6 lb

## 2013-04-09 DIAGNOSIS — C062 Malignant neoplasm of retromolar area: Secondary | ICD-10-CM

## 2013-04-09 NOTE — Progress Notes (Signed)
Weekly Management Note Current Dose:40 Gy  Projected Dose:60 Gy   Narrative:  The patient presents for routine under treatment assessment.  CBCT/MVCT images/Port film x-rays were reviewed.  The chart was checked. Doing well. Trying to eat as much as he can. Actually feeling better after treatment for thrush.  Taking pain meds bid.   Physical Findings:  No thrush. Mucocitis posteriorly. Posterior neck is red and irritated  Vitals:  Filed Vitals:   04/09/13 1429  BP: 133/83  Pulse: 56  Temp: 98.2 F (36.8 C)  Resp: 20   Weight:  Wt Readings from Last 3 Encounters:  04/09/13 157 lb 9.6 oz (71.487 kg)  04/02/13 161 lb 6.4 oz (73.211 kg)  03/28/13 161 lb 2.5 oz (73.1 kg)   Lab Results  Component Value Date   WBC 7.0 03/27/2013   HGB 10.3* 03/27/2013   HCT 32.9* 03/27/2013   MCV 90.6 03/27/2013   PLT 164 03/27/2013   Lab Results  Component Value Date   CREATININE 1.12 03/27/2013   BUN 20 03/27/2013   NA 139 03/27/2013   K 4.6 03/27/2013   CL 107 03/27/2013   CO2 26 03/27/2013     Impression:  The patient is tolerating radiation.  Plan:  Continue treatment as planned. Encouraged pos.

## 2013-04-09 NOTE — Progress Notes (Addendum)
Weekly rad txs, 20/30 completed , patient tongue a lot better, ate cheesecake yesterday but doesn't have any taste, no difficulty swallowing, neck pink,, back of neck, dry,desquamation, flaky and  using biafine cream, , fatigue  , drinks 3-4 cans ensure daily, today has had a n egg, grits, choc pudding, and water, sometimes not wanting to eat,no appetite, losss 3 lbs 1 week, sees Zenovia Jarred 04/11/13, he spoke with her today 2:34 PM

## 2013-04-10 ENCOUNTER — Ambulatory Visit
Admission: RE | Admit: 2013-04-10 | Discharge: 2013-04-10 | Disposition: A | Discharge status: Home / Self Care | Payer: Medicare Other | Source: Ambulatory Visit | Attending: Radiation Oncology | Admitting: Radiation Oncology

## 2013-04-11 ENCOUNTER — Ambulatory Visit: Payer: Medicare Other | Admitting: Nutrition

## 2013-04-11 ENCOUNTER — Ambulatory Visit
Admission: RE | Admit: 2013-04-11 | Discharge: 2013-04-11 | Disposition: A | Discharge status: Home / Self Care | Payer: Medicare Other | Source: Ambulatory Visit | Attending: Radiation Oncology | Admitting: Radiation Oncology

## 2013-04-11 NOTE — Progress Notes (Signed)
Patient presents to nutrition followup. He states he feels well. He no longer has constipation. He is drinking boost 3-4 times daily. He continues to care for his wife who has cancer. He reports a fair appetite. Weight has decreased to 157.6 pounds June 3 from 162 pounds May 6. Patient acknowledges that his weight loss has continued.  Nutrition diagnosis: Unintended weight loss continues.  Intervention: Patient was encouraged to continue boost +4 times a day with higher calorie, higher protein foods throughout the day. 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 I provided him with samples of Ensure Plus as well as coupons for boost plus. Teach back method used.  Monitoring, evaluation, goals: Patient will tolerate increased calories and protein and increase boost plus to 4 times daily to promote weight maintenance.   Next visit: There are no followup scheduled for patient.  He has my contact information for questions.

## 2013-04-12 ENCOUNTER — Ambulatory Visit
Admission: RE | Admit: 2013-04-12 | Discharge: 2013-04-12 | Disposition: A | Discharge status: Home / Self Care | Payer: Medicare Other | Source: Ambulatory Visit | Attending: Radiation Oncology | Admitting: Radiation Oncology

## 2013-04-15 ENCOUNTER — Ambulatory Visit
Admission: RE | Admit: 2013-04-15 | Discharge: 2013-04-15 | Disposition: A | Discharge status: Home / Self Care | Payer: Medicare Other | Source: Ambulatory Visit | Attending: Radiation Oncology | Admitting: Radiation Oncology

## 2013-04-15 DIAGNOSIS — C062 Malignant neoplasm of retromolar area: Secondary | ICD-10-CM

## 2013-04-15 MED ORDER — BIAFINE EX EMUL
CUTANEOUS | Status: DC | PRN
  Administered 2013-04-15: 15:00:00 via TOPICAL

## 2013-04-15 NOTE — Progress Notes (Signed)
2nd tube biafine given to patient per his requst almost out, bright erythema on most of right side neck, erythema lighter on left side

## 2013-04-16 ENCOUNTER — Ambulatory Visit
Admission: RE | Admit: 2013-04-16 | Discharge: 2013-04-16 | Disposition: A | Discharge status: Home / Self Care | Payer: Medicare Other | Source: Ambulatory Visit | Attending: Radiation Oncology | Admitting: Radiation Oncology

## 2013-04-16 ENCOUNTER — Encounter: Payer: Self-pay | Admitting: Radiation Oncology

## 2013-04-16 VITALS — BP 128/75 | HR 53 | Temp 97.6°F | Resp 20 | Wt 154.3 lb

## 2013-04-16 DIAGNOSIS — C062 Malignant neoplasm of retromolar area: Secondary | ICD-10-CM

## 2013-04-16 NOTE — Progress Notes (Addendum)
weekly rad txs 25 r neck, completed, drinks 3-4 boost cans daily, ate eggs grits today, takes an hour though to eat, neck right side bright erythema dry desquamation, left neck and around back erythema flaky on back of neck, patient hurts to swallow food"sore throat stated patyient", uses biafine cream bid, fatigue  More so now than past 2 weeks, cares for wife who has cancer also, tongue moist No nausea, sob, 3 lb wt. Loss from last week no appetite, everything has no taste states patient 2:40 PM

## 2013-04-16 NOTE — Progress Notes (Signed)
Gary [name]    Radiation Oncology [address] [place]     Gary [name], M.D. [place], [place] [address]               Gary [name], M.D., Ph.D. Phone: [phone]      Gary [name], M.D. Fax: [phone]      Gary [name], M.D., Ph.D.         Gary [name], M.D.         Gary [name], M.D Weekly Treatment Management Note  Name: Gary [name]     MRN: [id number]        CSN: [id number] Date: 04/16/2013      DOB: 02-17-1941  CC: Gary Moh, MD         [place]    Status: Outpatient  Diagnosis: The encounter diagnosis was T2N1 SCCA of right retromolar trigone..  Current Dose: 50 Gy  Current Fraction: 25  Planned Dose: 60 Gy  Narrative: Gary [name] was seen today for weekly treatment management. The chart was checked and CBCT  were reviewed. The patient is having some soreness and difficulty with swallowing but is able to take in solid foods. He can take all of his pills except fish oil.  Cellcept; Colcrys; Erythromycin; Methotrexate derivatives; Neosporin; and Ultram  Current Outpatient Prescriptions  Medication Sig Dispense Refill  . acyclovir (ZOVIRAX) 400 MG tablet Take 400 mg by mouth 2 (two) times daily.       [name] albuterol-ipratropium (COMBIVENT) 18-103 MCG/ACT inhaler Inhale 2 puffs into the lungs every 6 (six) hours as needed for wheezing.  1 Inhaler  2  . ALPRAZolam (XANAX) 0.5 MG tablet Take 0.5 mg by mouth 3 (three) times daily as needed for sleep.       [name] Alum & Mag Hydroxide-Simeth (MAGIC MOUTHWASH W/LIDOCAINE) SOLN Swish and spit 10 mLs 3 (three) times daily.      [name] aspirin EC 81 MG tablet Take 81 mg by mouth at bedtime.       . bisoprolol (ZEBETA) 5 MG tablet Take 2 tablets (10 mg total) by mouth daily.      . cetirizine (ZYRTEC) 10 MG tablet Take 10 mg by mouth daily.      . Cholecalciferol (VITAMIN D) 1000 UNITS capsule Take 1,000 Units by mouth daily.      [name] desonide (DESOWEN) 0.05 % cream Apply 1 application topically 2  (two) times daily. Applied to face and behind ears.      . Dexamethasone 0.7 MG IMPL Place 1 each into both eyes every 6 (six) months.      . diazepam (VALIUM) 5 MG tablet Take 1 tablet (5 mg total) by mouth every 12 (twelve) hours as needed for anxiety. For anxiety  30 tablet  0  . dorzolamide-timolol (COSOPT) 22.3-6.8 MG/ML ophthalmic solution Place 1 drop into both eyes 2 (two) times daily.      [name] emollient (BIAFINE) cream Apply 1 application topically 2 (two) times daily. Apply to right side neck after rad tx and bedtime,/prn, except not 4 hours prior to rad txs, apply daily      . feeding supplement (ENSURE COMPLETE) LIQD Take 237 mLs by mouth 3 (three) times daily between meals.      . ferrous sulfate 325 (65 FE) MG tablet Take 1 tablet (325 mg total) by mouth 3 (three) times daily after meals.      . fluconazole (DIFLUCAN) 100 MG tablet Take 1 tablet (100 mg total)  by mouth daily.  10 tablet  0  . levofloxacin (LEVAQUIN) 750 MG tablet Take 1 tablet (750 mg total) by mouth daily.  8 tablet  0  . levothyroxine (SYNTHROID, LEVOTHROID) 200 MCG tablet Take 200 mcg by mouth daily.      . meclizine (ANTIVERT) 25 MG tablet Take 25 mg by mouth every 12 (twelve) hours as needed for dizziness.       . Multiple Vitamin (MULTIVITAMIN WITH MINERALS) TABS Take 1 tablet by mouth daily.      [name] oxyCODONE (ROXICODONE) 15 MG immediate release tablet Take 15 mg by mouth every 4 (four) hours as needed for pain.      . pantoprazole (PROTONIX) 40 MG tablet Take 40 mg by mouth daily.      . polyethylene glycol (MIRALAX / GLYCOLAX) packet Take 17 g by mouth daily.      . predniSONE (DELTASONE) 10 MG tablet Prednisone 40 mg po daily x 1 day then Prednisone 30 mg po daily x 1 day then Prednisone 20 mg po daily x 1 day then Prednisone 10 mg daily x 1 day then  Continue taking your usual dose of Prednisone 5 mg po daily as prescribed.  10 tablet  0  . predniSONE (DELTASONE) 5 MG tablet Take 5 mg by mouth daily.      .  Rivaroxaban (XARELTO) 20 MG TABS Take 1 tablet (20 mg total) by mouth daily.  30 tablet  2  . sertraline (ZOLOFT) 100 MG tablet Take 100 mg by mouth 2 (two) times daily.      . simvastatin (ZOCOR) 40 MG tablet Take 40 mg by mouth every evening.      . Zinc 30 MG CAPS Take 1 capsule by mouth daily.      [name] zolpidem (AMBIEN) 10 MG tablet Take 5-10 mg by mouth at bedtime as needed. For sleep       No current facility-administered medications for this encounter.   Labs:  Lab Results  Component Value Date   WBC 7.0 03/27/2013   HGB 10.3* 03/27/2013   HCT 32.9* 03/27/2013   MCV 90.6 03/27/2013   PLT 164 03/27/2013   Lab Results  Component Value Date   CREATININE 1.12 03/27/2013   BUN 20 03/27/2013   NA 139 03/27/2013   K 4.6 03/27/2013   CL 107 03/27/2013   CO2 26 03/27/2013   Lab Results  Component Value Date   ALT 8 03/27/2013   AST 14 03/27/2013   PHOS 2.9 03/26/2013   BILITOT 0.4 03/27/2013    Physical Examination:  weight is 154 lb 4.8 oz (69.99 kg). His oral temperature is 97.6 F (36.4 C). His blood pressure is 128/75 and his pulse is 53. His respiration is 20.    Wt Readings from Last 3 Encounters:  04/16/13 154 lb 4.8 oz (69.99 kg)  04/09/13 157 lb 9.6 oz (71.487 kg)  04/02/13 161 lb 6.4 oz (73.211 kg)    The neck area shows erythema and dry desquamation but no moist desquamation. The oral cavity reveals mucositis along the pharyngeal area but no hemorrhagic mucositis or signs of secondary infection. Lungs - Normal respiratory effort, chest expands symmetrically. Lungs are clear to auscultation, no crackles or wheezes.  Heart has regular rhythm and rate  Abdomen is soft and non tender with normal bowel sounds  Assessment:  Patient tolerating treatments well except for issues as above  Plan: Continue treatment per original radiation prescription

## 2013-04-17 ENCOUNTER — Ambulatory Visit
Admission: RE | Admit: 2013-04-17 | Discharge: 2013-04-17 | Disposition: A | Discharge status: Home / Self Care | Payer: Medicare Other | Source: Ambulatory Visit | Attending: Radiation Oncology | Admitting: Radiation Oncology

## 2013-04-18 ENCOUNTER — Ambulatory Visit
Admission: RE | Admit: 2013-04-18 | Discharge: 2013-04-18 | Disposition: A | Discharge status: Home / Self Care | Payer: Medicare Other | Source: Ambulatory Visit | Attending: Radiation Oncology | Admitting: Radiation Oncology

## 2013-04-19 ENCOUNTER — Ambulatory Visit
Admission: RE | Admit: 2013-04-19 | Discharge: 2013-04-19 | Disposition: A | Discharge status: Home / Self Care | Payer: Medicare Other | Source: Ambulatory Visit | Attending: Radiation Oncology | Admitting: Radiation Oncology

## 2013-04-22 ENCOUNTER — Ambulatory Visit
Admission: RE | Admit: 2013-04-22 | Discharge: 2013-04-22 | Disposition: A | Discharge status: Home / Self Care | Payer: Medicare Other | Source: Ambulatory Visit | Attending: Radiation Oncology | Admitting: Radiation Oncology

## 2013-04-22 ENCOUNTER — Ambulatory Visit: Payer: Medicare Other

## 2013-04-23 ENCOUNTER — Ambulatory Visit: Payer: Medicare Other

## 2013-04-23 ENCOUNTER — Encounter: Payer: Self-pay | Admitting: Radiation Oncology

## 2013-04-23 ENCOUNTER — Ambulatory Visit
Admission: RE | Admit: 2013-04-23 | Discharge: 2013-04-23 | Disposition: A | Discharge status: Home / Self Care | Payer: Medicare Other | Source: Ambulatory Visit | Attending: Radiation Oncology | Admitting: Radiation Oncology

## 2013-04-23 VITALS — BP 153/85 | HR 55 | Temp 98.1°F | Resp 20 | Wt 155.4 lb

## 2013-04-23 DIAGNOSIS — C062 Malignant neoplasm of retromolar area: Secondary | ICD-10-CM

## 2013-04-23 NOTE — Progress Notes (Addendum)
Weekly rad txs 30/30 r retromo/neck,bright erythema with scabs, healing othr areas, using biafine cream bid, drinks 3-4 cans boost daily, eats sunny side eggs, drinks water, difficultly chewing can't get his dentures in, hard to swallow,  Energy level low, better than he thought stated,worried more about wife, having  A test in rdiology concerning her multiple myeloma, , sore throat , still has thick mucous especially after drinking boost every mornings,  No taste in foods,  gave f/u appt card  2:56 PM

## 2013-04-23 NOTE — Progress Notes (Signed)
Weekly Management Note Current Dose:60 Gy  Projected Dose:60 Gy   Narrative:  The patient presents for routine under treatment assessment.  CBCT/MVCT images/Port film x-rays were reviewed.  The chart was checked. Doing ok. Worried about his wife (dx with multiple myeloma s/p transplant and has f/u scans today at [facility]). Pain this weekend. Used pain pills with moderate relief. Better now. Not wearing dentures.   Physical Findings:  Mucocitis over right mouth. Right neck is pink.   Vitals:  Filed Vitals:   04/23/13 1448  BP: 153/85  Pulse: 55  Temp: 98.1 F (36.7 C)  Resp: 20   Weight:  Wt Readings from Last 3 Encounters:  04/23/13 155 lb 6.4 oz (70.489 kg)  04/16/13 154 lb 4.8 oz (69.99 kg)  04/09/13 157 lb 9.6 oz (71.487 kg)   Lab Results  Component Value Date   WBC 7.0 03/27/2013   HGB 10.3* 03/27/2013   HCT 32.9* 03/27/2013   MCV 90.6 03/27/2013   PLT 164 03/27/2013   Lab Results  Component Value Date   CREATININE 1.12 03/27/2013   BUN 20 03/27/2013   NA 139 03/27/2013   K 4.6 03/27/2013   CL 107 03/27/2013   CO2 26 03/27/2013     Impression:  Finishes RT today.   Plan:  Follow up in 2 weeks. Sooner if needed. Continue po as tolerated. Continue biafene. Offered counseling for him or his wife or both.

## 2013-04-24 NOTE — Progress Notes (Signed)
  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 54 Gy in 30 fractions to the standard risk lymph nodes including the left neck  Beams/energy:   6 MV photons in a IMRT plan utilizing 2 arcs  Narrative: The patient tolerated radiation treatment relatively well.   He actually felt much better after treatment for thrush as well as removal of a small bit of gauze from his lower jaw. He had the expected skin redness and mucositis.  Plan: The patient has completed radiation treatment. The patient will return to radiation oncology clinic for routine followup in one month. I advised them to call or return sooner if they have any questions or concerns related to their recovery or treatment.  ------------------------------------------------  Lurline Hare, MD

## 2013-05-07 ENCOUNTER — Encounter: Payer: Self-pay | Admitting: Radiation Oncology

## 2013-05-07 NOTE — Progress Notes (Deleted)
Patient came to nursing with c/o dysuria since last night, 8pm, she is being treated for left lung radiation , has completed 24/30, txs, skin on front chest between breasts,under inframmary fold has some peeling dry desquamation on back tanning mid back,skin intact, gave neosporin samples and telfa dressings, patient wanted gause to put under inframmary fold, asked to be seen for possible UTI, interpreter with patient\ 9:42 AM

## 2013-05-09 ENCOUNTER — Ambulatory Visit
Admission: RE | Admit: 2013-05-09 | Discharge: 2013-05-09 | Disposition: A | Discharge status: Home / Self Care | Payer: Medicare Other | Source: Ambulatory Visit | Attending: Radiation Oncology | Admitting: Radiation Oncology

## 2013-05-09 ENCOUNTER — Encounter: Payer: Self-pay | Admitting: Radiation Oncology

## 2013-05-09 VITALS — BP 129/76 | HR 50 | Temp 97.6°F | Resp 20 | Wt 149.5 lb

## 2013-05-09 DIAGNOSIS — C062 Malignant neoplasm of retromolar area: Secondary | ICD-10-CM

## 2013-05-09 HISTORY — DX: Personal history of irradiation: Z92.3

## 2013-05-09 NOTE — Progress Notes (Addendum)
Follow up s/p radiation right retromolar trigone, 03/11/13-04/23/13, neck dry ,slight pink,well healed,c/o itching drom dryness, drinks plenty water,, has no taste, dry mouth,. Not coughing up pheglm like before, much better stated patient,  still uses biafine cream if he goes outside any length of time stated, unable to swallow foods, gags and gets stuck, then coughs and comes up, drinks 3 cans boost daily, on prednisone 5mg  daily still, last seen Dr.Byers 03/24/13, no f/u appt scheduled, 99% room air, vitals wnl 3:30 PM

## 2013-05-09 NOTE — Progress Notes (Signed)
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  Gary [name] is now 3 weeks out from completion of radiation. Gary [name] states that Gary [name] feels better every day. Gary [name] still complains of dry mouth and decreased taste. Gary [name] is taking boost 3 cans a day and also Gary [name] been. Gary [name] continues to have dry mouth which Gary [name] treats by keeping her bowels water with him. Gary [name] has no pain. Gary [name] is not on any pain medications. His skin is healed up well. His wife unfortunately is undergoing chemotherapy again for her recurrent multiple myeloma. Gary [name] has many medical appointments for her here in [place] like to be followed in [place] at all possible.  Allergies:  Allergies  Allergen Reactions  . Cellcept (Mycophenolate Mofetil) Other (See Comments)    Reaction unknown  . Colcrys (Colchicine) Other (See Comments)    myalgias  . Erythromycin Other (See Comments)    Reaction unknown  . Methotrexate Derivatives Hives, Itching and Rash    Only in tablet form  . Neosporin (Neomycin-Bacitracin Zn-Polymyx) Rash  . Ultram (Tramadol) Rash    Medications:  Current Outpatient Prescriptions  Medication Sig Dispense Refill  . acyclovir (ZOVIRAX) 400 MG tablet Take 400 mg by mouth 2 (two) times daily.       [name] albuterol-ipratropium (COMBIVENT) 18-103 MCG/ACT inhaler Inhale 2 puffs into the lungs every 6 (six) hours as needed for wheezing.  1 Inhaler  2  . ALPRAZolam (XANAX) 0.5 MG tablet Take 0.5 mg by mouth 3 (three) times daily as needed for sleep.       [name] Alum & Mag Hydroxide-Simeth (MAGIC MOUTHWASH W/LIDOCAINE) SOLN Swish and spit 10 mLs 3 (three) times  daily.      [name] aspirin EC 81 MG tablet Take 81 mg by mouth at bedtime.       . bisoprolol (ZEBETA) 5 MG tablet Take 2 tablets (10 mg total) by mouth daily.      . cetirizine (ZYRTEC) 10 MG tablet Take 10 mg by mouth daily.      . Cholecalciferol (VITAMIN D) 1000 UNITS capsule Take 1,000 Units by mouth daily.      [name] desonide (DESOWEN) 0.05 % cream Apply 1 application topically 2 (two) times daily. Applied to face and behind ears.      . Dexamethasone 0.7 MG IMPL Place 1 each into both eyes every 6 (six) months.      . diazepam (VALIUM) 5 MG tablet Take 1 tablet (5 mg total) by mouth every 12 (twelve) hours as needed for anxiety. For anxiety  30 tablet  0  . dorzolamide-timolol (COSOPT) 22.3-6.8 MG/ML ophthalmic solution Place 1 drop into both eyes 2 (two) times daily.      [name] emollient (BIAFINE) cream Apply 1 application topically 2 (two) times daily. Apply to right side neck after rad tx and bedtime,/prn, except not 4 hours prior to rad txs, apply daily      . feeding supplement (ENSURE COMPLETE) LIQD Take 237 mLs by mouth 3 (three) times daily between meals.      . ferrous sulfate 325 (65 FE) MG tablet Take 1  tablet (325 mg total) by mouth 3 (three) times daily after meals.      [name] levothyroxine (SYNTHROID, LEVOTHROID) 200 MCG tablet Take 200 mcg by mouth daily.      . meclizine (ANTIVERT) 25 MG tablet Take 25 mg by mouth every 12 (twelve) hours as needed for dizziness.       . Multiple Vitamin (MULTIVITAMIN WITH MINERALS) TABS Take 1 tablet by mouth daily.      [name] oxyCODONE (ROXICODONE) 15 MG immediate release tablet Take 15 mg by mouth every 4 (four) hours as needed for pain.      . pantoprazole (PROTONIX) 40 MG tablet Take 40 mg by mouth daily.      . polyethylene glycol (MIRALAX / GLYCOLAX) packet Take 17 g by mouth daily.      . predniSONE (DELTASONE) 10 MG tablet Prednisone 40 mg po daily x 1 day then Prednisone 30 mg po daily x 1 day then Prednisone 20 mg po daily x 1 day then Prednisone 10  mg daily x 1 day then  Continue taking your usual dose of Prednisone 5 mg po daily as prescribed.  10 tablet  0  . predniSONE (DELTASONE) 5 MG tablet Take 5 mg by mouth daily.      . Rivaroxaban (XARELTO) 20 MG TABS Take 1 tablet (20 mg total) by mouth daily.  30 tablet  2  . sertraline (ZOLOFT) 100 MG tablet Take 100 mg by mouth 2 (two) times daily.      . simvastatin (ZOCOR) 40 MG tablet Take 40 mg by mouth every evening.      . Zinc 30 MG CAPS Take 1 capsule by mouth daily.      [name] zolpidem (AMBIEN) 10 MG tablet Take 5-10 mg by mouth at bedtime as needed. For sleep       No current facility-administered medications for this encounter.    Physical Exam:  Filed Vitals:   05/07/13 0945  BP: 129/76  Pulse: 50  Temp: 97.6 F (36.4 C)  Resp: 20  Gary [name] is a pleasant male in no distress sitting comfortably examining table. His weight is 150 pounds which is down about 5 pounds from his end of treatment weight. His skin is well-healed. Gary [name] still has mucositis along his tongue. His mandible and alveolar ridge are well healed.   IMPRESSION: Gary [name] is a 72 y.o. male status post surgery and radiation for a node-positive oral cavity cancer with resolving acute effects of treatment  PLAN:  I talked to Gary [name] today about increasing his nutrition. Gary [name] is trying to push this is much as Gary [name] can. We talked about strategies for increasing by mouth intake. We discussed the followup plan. The current recommendations are to for a physical exam every 2-3 months and the first year and then every 2-6 months in year 2. Imaging is based on symptoms alone given his stage. Gary [name] would like to be followed here in [place] secondary to his wife's tenuous medical status. I've asked him to make an appointment with Dr. Jearld Fenton in September October and I will plan on seeing him back in December. I encouraged him to call with any questions in the interim. Gary [name] is agreed to do so.    Lurline Hare, MD

## 2013-07-18 ENCOUNTER — Other Ambulatory Visit: Payer: Self-pay | Admitting: Radiation Oncology

## 2013-07-18 ENCOUNTER — Telehealth: Payer: Self-pay | Admitting: Radiation Oncology

## 2013-07-18 DIAGNOSIS — C062 Malignant neoplasm of retromolar area: Secondary | ICD-10-CM

## 2013-07-18 MED ORDER — FLUCONAZOLE 100 MG PO TABS: 100.0000 mg | ORAL_TABLET | Freq: Every day | ORAL | Status: DC

## 2013-07-18 NOTE — Telephone Encounter (Signed)
error 

## 2013-07-19 ENCOUNTER — Encounter: Payer: Self-pay | Admitting: Radiation Oncology

## 2013-07-19 NOTE — Progress Notes (Signed)
Spoke to patient concerning upcoming CT and follow up appointments in November and December.

## 2013-07-24 ENCOUNTER — Telehealth: Payer: Self-pay

## 2013-07-24 NOTE — Telephone Encounter (Signed)
Pt.informed to hold simvastatin until he has completed diflucan per Dr.Wentworth.

## 2013-08-27 ENCOUNTER — Telehealth: Payer: Self-pay | Admitting: *Deleted

## 2013-08-27 ENCOUNTER — Other Ambulatory Visit: Payer: Self-pay | Admitting: Radiation Oncology

## 2013-08-27 DIAGNOSIS — C062 Malignant neoplasm of retromolar area: Secondary | ICD-10-CM

## 2013-08-27 NOTE — Telephone Encounter (Signed)
CALLED PATIENT TO ASK ABOUT COMING IN FOR A LAB, LVM FOR A RETURN CALL 

## 2013-09-09 ENCOUNTER — Ambulatory Visit (INDEPENDENT_AMBULATORY_CARE_PROVIDER_SITE_OTHER): Payer: Medicare Other | Admitting: Cardiovascular Disease

## 2013-09-09 ENCOUNTER — Encounter: Payer: Self-pay | Admitting: Cardiovascular Disease

## 2013-09-09 VITALS — BP 130/70 | HR 64 | Ht 72.0 in | Wt 134.0 lb

## 2013-09-09 DIAGNOSIS — I10 Essential (primary) hypertension: Secondary | ICD-10-CM

## 2013-09-09 NOTE — Progress Notes (Signed)
09/09/2013 Gary [name]   05/15/1941  [id number]  Primary Physician Gary Moh, MD Primary Cardiologist: Formerly Dr. Aleen [name]. Establishing with Dr. Allyson [name]  HPI:   72 year old male with history of CAD with DES to LAD and  perioperative arrythmia (paroxysmal a. Fib and history of sick sinus syndrome) presenting to reestablish care with a new cardiologist (Dr. Allyson [name]).   Patient was las seen in March of this year. He had been diagnosed with paroxysmal a fib preoperatively  after he fractured his hip in February of 2014. Patient also had a sick sinus component with sinus bradycardia and a fib with RVR. He converted to sinus rhythm postoperatively. Since discharge from the hospital he went to [facility] for rehab and was later released to home. He was cleared from a cardiac perspective (Nuclear Medicine Stress test with low risk study though there was a mostly fixed inferior defect) for a squamous cell cancer removal from his mouth at that time as well. Review of recent HR from May to July was typically 50-58 but other measurements this year have been up to 102. At visit here in March, bisoprolol was reduced to 5mg  daily from 20 mg total daily.   From an anticoagulation perspective, patient states he cannot afford Xarelto. He stopped taking it 10 days ago because the $200 was too high.   ROS-patient denies palpitations, chest pain, or shortness of breath. Denies lightheadedness or dizziness. He does endorse fatigue but states much of this is due to caring for his wife who has cancer.    Current Outpatient Prescriptions  Medication Sig Dispense Refill  . acyclovir (ZOVIRAX) 400 MG tablet Take 400 mg by mouth 2 (two) times daily.       [name] ALPRAZolam (XANAX) 0.5 MG tablet Take 0.5 mg by mouth at bedtime as needed and may repeat dose one time if needed for sleep.       [name] aspirin EC 81 MG tablet Take 81 mg by mouth at bedtime.       . bisoprolol (ZEBETA) 5 MG tablet Take 2 tablets (10  mg total) by mouth daily.      . Calcium Carbonate-Vitamin D (CALCIUM-D) 600-400 MG-UNIT TABS Take 1 mg by mouth daily. 600/400      . cetirizine (ZYRTEC) 10 MG tablet Take 10 mg by mouth daily.      [name] desonide (DESOWEN) 0.05 % cream Apply 1 application topically 2 (two) times daily. Applied to face and behind ears.      . diazepam (VALIUM) 5 MG tablet Take 1 tablet (5 mg total) by mouth every 12 (twelve) hours as needed for anxiety. For anxiety  30 tablet  0  . dorzolamide-timolol (COSOPT) 22.3-6.8 MG/ML ophthalmic solution Place 1 drop into both eyes 2 (two) times daily.      [name] levothyroxine (SYNTHROID, LEVOTHROID) 200 MCG tablet Take 200 mcg by mouth daily.      . meclizine (ANTIVERT) 25 MG tablet Take 25 mg by mouth every 12 (twelve) hours as needed for dizziness.       . metroNIDAZOLE (METROGEL) 1 % gel Apply 1 application topically as needed.      . Multiple Vitamin (MULTIVITAMIN WITH MINERALS) TABS Take 1 tablet by mouth daily.      . mupirocin cream (BACTROBAN) 2 % Apply 1 application topically as needed.      [name] omega-3 acid ethyl esters (LOVAZA) 1 G capsule Take 1 g by mouth 2 (two) times daily.      [name]  oxyCODONE (ROXICODONE) 15 MG immediate release tablet Take 15 mg by mouth every 4 (four) hours as needed for pain.      . pantoprazole (PROTONIX) 40 MG tablet Take 40 mg by mouth daily.      . polyethylene glycol (MIRALAX / GLYCOLAX) packet Take 17 g by mouth daily.      . Potassium 99 MG TABS Take 1 mg by mouth daily.      . predniSONE (DELTASONE) 10 MG tablet Prednisone 40 mg po daily x 1 day then Prednisone 30 mg po daily x 1 day then Prednisone 20 mg po daily x 1 day then Prednisone 10 mg daily x 1 day then  Continue taking your usual dose of Prednisone 5 mg po daily as prescribed.  10 tablet  0  . predniSONE (DELTASONE) 5 MG tablet Take 5 mg by mouth daily.      [name] rOPINIRole (REQUIP) 1 MG tablet       . sertraline (ZOLOFT) 100 MG tablet Take 100 mg by mouth 2 (two) times daily.       . simvastatin (ZOCOR) 40 MG tablet Take 40 mg by mouth every evening.      . Zinc 30 MG CAPS Take 1 capsule by mouth daily.      [name] Dexamethasone 0.7 MG IMPL Place 1 each into both eyes every 6 (six) months.      . Rivaroxaban (XARELTO) 20 MG TABS Take 1 tablet (20 mg total) by mouth daily.  30 tablet  2   No current facility-administered medications for this visit.    Allergies  Allergen Reactions  . Cellcept [Mycophenolate Mofetil] Other (See Comments)    Reaction unknown  . Colcrys [Colchicine] Other (See Comments)    myalgias  . Erythromycin Other (See Comments)    Reaction unknown  . Methotrexate Derivatives Hives, Itching and Rash    Only in tablet form  . Neosporin [Neomycin-Bacitracin Zn-Polymyx] Rash  . Ultram [Tramadol] Rash    History   Social History  . Marital Status: Married    Spouse Name: N/A    Number of Children: 2  . Years of Education: N/A   Occupational History  . Retired     Games developer   Social History Main Topics  . Smoking status: Former Smoker -- 1.00 packs/day for 27 years    Types: Cigarettes    Quit date: 11/08/1983  . Smokeless tobacco: Current User    Types: Snuff  . Alcohol Use: No     Comment: "social"  . Drug Use: No  . Sexual Activity: Not on file   Other Topics Concern  . Not on file   Social History Narrative  . No narrative on file     Blood pressure 130/70, pulse 64, height 6' (1.829 m), weight 134 lb (60.782 kg).  General appearance: alert, cooperative and appears stated age Lungs: clear to auscultation bilaterally Heart: regular rate and rhythm, S1, S2 normal, no murmur, click, rub or gallop Extremities: extremities normal, atraumatic, no cyanosis or edema Pulses: 2+ and symmetric Skin: Skin color, texture, turgor normal. No rashes or lesions  EKG   Date: 09/09/2013  EKG Time: 5:22 PM  Rate: 64  Rhythm: normal sinus rhythm,  normal EKG, normal sinus rhythm  Axis: normal  Intervals:normal  ST&T Change: t  wave inversions in v1. Flattened t waves in v2. Similar to previous   ASSESSMENT AND PLAN:   Perioperative arrythmia (paroxysmal a. Fib and history of sick sinus syndrome)  -  EKG in NSR -Patient with no obvious events of paroxysmal a fib other than perioperatively. Patient admitted in May as well for COPD exacerbation Xarelto was restarted at that time as patient seems to have stopped it (or never filled it). Review of chart reviews only mentions of sinus rhythm and no atrial fibrillation. Given there was only 1 isolated perioperative event of atrial fibrillation, do not believe candidate will require long term anticoagulation with Xarelto or Coumadin despite Chads2 score technically of 2.   In addition, patient is asymptomatic from his intermittent bradycardia and is on low dose bisoprolol at 5mg . He is not in sinus bradycardia today.   CAD with DES to LAD in 2002.  -Low risk nuclear medicine stress study in March 2014. No further testing indicated. Maximize risk factors.   Patient has close follow up with PCP for hyperlipidemia and hypertension within the next few weeks. His blood pressure was well controlled in the office today.   Follow up in 1 year with Dr. Allyson [name]  Dr. Allyson [name] has seen and evaluated the patient. We have discussed the history, exam, assessment, and plan as noted above. He agrees with management.   Aldine Contes. Marti Sleigh, MD, PGY3 09/09/2013 5:21 PM  Review of Systems: General: negative for chills, fever, night sweats or weight changes.  Cardiovascular: negative for chest pain, dyspnea on exertion, edema, orthopnea, palpitations, paroxysmal nocturnal dyspnea or shortness of breath Dermatological: negative for rash Respiratory: negative for cough or wheezing Urologic: negative for hematuria Abdominal: negative for nausea, vomiting, diarrhea, bright red blood per rectum, melena, or hematemesis Neurologic: negative for visual changes, syncope, or dizziness All other systems  reviewed and are otherwise negative except as noted above.

## 2013-09-09 NOTE — Patient Instructions (Signed)
Your physician wants you to follow-up in: 1 year with Dr Berry. You will receive a reminder letter in the mail two months in advance. If you don't receive a letter, please call our office to schedule the follow-up appointment.  

## 2013-09-17 ENCOUNTER — Ambulatory Visit
Admission: RE | Admit: 2013-09-17 | Discharge: 2013-09-17 | Disposition: A | Discharge status: Home / Self Care | Payer: Medicare Other | Source: Ambulatory Visit | Attending: Radiation Oncology | Admitting: Radiation Oncology

## 2013-09-17 DIAGNOSIS — C062 Malignant neoplasm of retromolar area: Secondary | ICD-10-CM | POA: Insufficient documentation

## 2013-09-17 LAB — BUN AND CREATININE (CC13): BUN: 32.3 mg/dL — ABNORMAL HIGH (ref 7.0–26.0)

## 2013-09-18 ENCOUNTER — Ambulatory Visit (HOSPITAL_COMMUNITY)
Admission: RE | Admit: 2013-09-18 | Discharge: 2013-09-18 | Disposition: A | Discharge status: Home / Self Care | Payer: Medicare Other | Source: Ambulatory Visit | Attending: Radiation Oncology | Admitting: Radiation Oncology

## 2013-09-18 DIAGNOSIS — C670 Malignant neoplasm of trigone of bladder: Secondary | ICD-10-CM | POA: Insufficient documentation

## 2013-09-18 DIAGNOSIS — C062 Malignant neoplasm of retromolar area: Secondary | ICD-10-CM

## 2013-09-18 DIAGNOSIS — J984 Other disorders of lung: Secondary | ICD-10-CM | POA: Insufficient documentation

## 2013-09-18 MED ORDER — IOHEXOL 300 MG/ML  SOLN
100.0000 mL | Freq: Once | INTRAMUSCULAR | Status: AC | PRN
  Administered 2013-09-18: 100 mL via INTRAVENOUS

## 2013-09-23 ENCOUNTER — Other Ambulatory Visit: Payer: Self-pay | Admitting: Gastroenterology

## 2013-09-23 DIAGNOSIS — Z85819 Personal history of malignant neoplasm of unspecified site of lip, oral cavity, and pharynx: Secondary | ICD-10-CM

## 2013-09-23 DIAGNOSIS — R634 Abnormal weight loss: Secondary | ICD-10-CM

## 2013-09-23 DIAGNOSIS — R1110 Vomiting, unspecified: Secondary | ICD-10-CM

## 2013-09-24 ENCOUNTER — Telehealth: Payer: Self-pay | Admitting: Radiation Oncology

## 2013-09-24 NOTE — Telephone Encounter (Signed)
Faxed notes to [facility].  OK per SW.  Received confirmation.

## 2013-09-25 ENCOUNTER — Telehealth: Payer: Self-pay | Admitting: *Deleted

## 2013-09-25 ENCOUNTER — Ambulatory Visit
Admission: RE | Admit: 2013-09-25 | Discharge: 2013-09-25 | Disposition: A | Discharge status: Home / Self Care | Payer: Medicare Other | Source: Ambulatory Visit | Attending: Gastroenterology | Admitting: Gastroenterology

## 2013-09-25 DIAGNOSIS — R1110 Vomiting, unspecified: Secondary | ICD-10-CM

## 2013-09-25 DIAGNOSIS — R634 Abnormal weight loss: Secondary | ICD-10-CM

## 2013-09-25 DIAGNOSIS — Z85819 Personal history of malignant neoplasm of unspecified site of lip, oral cavity, and pharynx: Secondary | ICD-10-CM

## 2013-09-25 NOTE — Telephone Encounter (Signed)
CT scan faxed to Dr. Hezzie Bump 09/20/13

## 2013-10-10 ENCOUNTER — Ambulatory Visit
Admission: RE | Admit: 2013-10-10 | Discharge: 2013-10-10 | Disposition: A | Discharge status: Home / Self Care | Payer: Medicare Other | Source: Ambulatory Visit | Attending: Radiation Oncology | Admitting: Radiation Oncology

## 2013-10-10 ENCOUNTER — Encounter: Payer: Self-pay | Admitting: Radiation Oncology

## 2013-10-10 VITALS — BP 126/74 | HR 48 | Temp 97.9°F | Resp 18 | Wt 126.5 lb

## 2013-10-10 DIAGNOSIS — C062 Malignant neoplasm of retromolar area: Secondary | ICD-10-CM

## 2013-10-10 NOTE — Progress Notes (Signed)
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  He is 6 months out from radiation. He he continues to lose weight and has lost about 30 since may. He has lost 8 pounds since the beginning of November. His primary care physician is referred him for a gastrointestinal workup. He is on thyroid replacement. He states he has some appetite but has lost his taste for sweet's. He also states he does not have time to cook as he is taking care of his wife full-time who is back in treatment with chemotherapy. He states the first time he could was yesterday and that was the first time in about 2 weeks. They don't have any family and have little community support. He also reports dry mouth which he thinks is improving. He has appointment at [facility] with ENT in January. He saw Dr. Jearld Fenton in September and was negative for recurrent disease. He had a CT in November which showed some opacification of the right maxillary sinus which was chronic and asymmetry in the retromolar trigone with clinical correlation recommended. No abnormal appearing lymph nodes were noted. He reports no maxillary sinus pressure, fevers or chills. He did have the episode of the beginning of treatment with a retained sponge and once that was removed the pain left back. He reports no to the pain. He still has occasional right ear pain or right neck pain which has been present since surgery surgery but seems to be getting better. He has not been fitted for dentures as he feels his jaw is still too sore.  Allergies:  Allergies  Allergen Reactions  .  Cellcept (Mycophenolate Mofetil) Other (See Comments)    Reaction unknown  . Colcrys (Colchicine) Other (See Comments)    myalgias  . Erythromycin Other (See Comments)    Reaction unknown  . Methotrexate Derivatives Hives, Itching and Rash    Only in tablet form  . Neosporin (Neomycin-Bacitracin Zn-Polymyx) Rash  . Ultram (Tramadol) Rash    Medications:  Current Outpatient Prescriptions  Medication Sig Dispense Refill  . acyclovir (ZOVIRAX) 400 MG tablet Take 400 mg by mouth 2 (two) times daily.       [name] albuterol-ipratropium (COMBIVENT) 18-103 MCG/ACT inhaler Inhale 2 puffs into the lungs every 6 (six) hours as needed for wheezing.  1 Inhaler  2  . ALPRAZolam (XANAX) 0.5 MG tablet Take 0.5 mg by mouth 3 (three) times daily as needed for sleep.       [name] Alum & Mag Hydroxide-Simeth (MAGIC MOUTHWASH W/LIDOCAINE) SOLN Swish and spit 10 mLs 3 (three) times daily.      [name] aspirin EC 81 MG tablet Take 81 mg by mouth at bedtime.       . bisoprolol (ZEBETA) 5 MG tablet Take 2 tablets (10 mg total) by mouth daily.      . cetirizine (ZYRTEC) 10 MG tablet Take 10 mg by mouth daily.      . Cholecalciferol (VITAMIN D) 1000 UNITS capsule Take 1,000 Units by mouth daily.      [name] desonide (DESOWEN) 0.05 % cream Apply 1 application topically  2 (two) times daily. Applied to face and behind ears.      . Dexamethasone 0.7 MG IMPL Place 1 each into both eyes every 6 (six) months.      . diazepam (VALIUM) 5 MG tablet Take 1 tablet (5 mg total) by mouth every 12 (twelve) hours as needed for anxiety. For anxiety  30 tablet  0  . dorzolamide-timolol (COSOPT) 22.3-6.8 MG/ML ophthalmic solution Place 1 drop into both eyes 2 (two) times daily.      [name] emollient (BIAFINE) cream Apply 1 application topically 2 (two) times daily. Apply to right side neck after rad tx and bedtime,/prn, except not 4 hours prior to rad txs, apply daily      . feeding supplement (ENSURE COMPLETE) LIQD Take 237 mLs by mouth 3 (three) times daily  between meals.      . ferrous sulfate 325 (65 FE) MG tablet Take 1 tablet (325 mg total) by mouth 3 (three) times daily after meals.      [name] levothyroxine (SYNTHROID, LEVOTHROID) 200 MCG tablet Take 200 mcg by mouth daily.      . meclizine (ANTIVERT) 25 MG tablet Take 25 mg by mouth every 12 (twelve) hours as needed for dizziness.       . Multiple Vitamin (MULTIVITAMIN WITH MINERALS) TABS Take 1 tablet by mouth daily.      [name] oxyCODONE (ROXICODONE) 15 MG immediate release tablet Take 15 mg by mouth every 4 (four) hours as needed for pain.      . pantoprazole (PROTONIX) 40 MG tablet Take 40 mg by mouth daily.      . polyethylene glycol (MIRALAX / GLYCOLAX) packet Take 17 g by mouth daily.      . predniSONE (DELTASONE) 10 MG tablet Prednisone 40 mg po daily x 1 day then Prednisone 30 mg po daily x 1 day then Prednisone 20 mg po daily x 1 day then Prednisone 10 mg daily x 1 day then  Continue taking your usual dose of Prednisone 5 mg po daily as prescribed.  10 tablet  0  . predniSONE (DELTASONE) 5 MG tablet Take 5 mg by mouth daily.      . Rivaroxaban (XARELTO) 20 MG TABS Take 1 tablet (20 mg total) by mouth daily.  30 tablet  2  . sertraline (ZOLOFT) 100 MG tablet Take 100 mg by mouth 2 (two) times daily.      . simvastatin (ZOCOR) 40 MG tablet Take 40 mg by mouth every evening.      . Zinc 30 MG CAPS Take 1 capsule by mouth daily.      [name] zolpidem (AMBIEN) 10 MG tablet Take 5-10 mg by mouth at bedtime as needed. For sleep       No current facility-administered medications for this encounter.    Physical Exam:  Filed Vitals:   05/07/13 0945  BP: 129/76  Pulse: 50  Temp: 97.6 F (36.4 C)  Resp: 20  He is a pleasant male in no distress sitting comfortably examining table. He is cachectic. He has no palpable cervical or suprapubic or adenopathy. He has no pain to palpation of his right maxillary sinus. He has no evidence of recurrent disease over the right alveolar ridge retromolar trigone.  His mucous membranes are tacky. The posterior right base of tongue and tonsillar fossa are clear on indirect mirror examination with no evidence of disease. No evidence disease in the left tonsillar fossa or left base of tongue either.  IMPRESSION: Daymian is  a 72 y.o. male status post surgery and radiation for a node-positive oral cavity cancer with resolving acute effects of treatment  PLAN:  I talked to [name] about increasing his nutrition. He is trying to push this is much as he can. We talked about strategies for increasing by mouth intake. We discussed the followup plan. The current recommendations are to for a physical exam every 2-3 months and the first year and then every 2-6 months in year 2. Imaging is based on symptoms alone given his stage. He is seeing Dr. Gordy Levan and at [facility] in January. I will plan on seeing him back in March or April. He is asymptomatic from this right maxillary sinus issues seen on CAT scan so I have not pursued further imaging. We will continue to monitor that. I think his weight loss is due to lack of food and ability to prepare food. I have reached out to social work to see if they can set him up with a Meals on [employer] or perhaps Senior services. I've also encouraged him to try to get fitted for dentures to increase his oral intake as well.    Lurline Hare, MD

## 2013-10-10 NOTE — Progress Notes (Signed)
TSH 6 months ago 0.079. Weighed 161 on 5/22 but, 126.5 lb today. Reports he has a good appetite and denies painful swallowing. Reports he has stopped drinking 3 cans of boost per day because it caused constipation. Reports taster has improved on the left side but, is still absent on the right. Denies nausea, vomiting, headache or dizziness. Reports dry mouth is better. Wife receiving chemotherapy.

## 2013-10-11 ENCOUNTER — Ambulatory Visit: Payer: Medicare Other | Admitting: Nutrition

## 2013-10-11 NOTE — Progress Notes (Signed)
Nutrition followup completed with patient.  He reports he feels his strength improving.  He continues to have a dry mouth.  He stopped drinking oral nutrition supplements because he feels that they were constipating.  He continues to care for his wife, who is going through cancer treatment.  Weight has decreased significantly and was documented as 126.5 pounds on December 4, from 157.6 pounds June 3.  Current BMI of 17.15 is underweight.  Nutrition diagnosis: Unintended weight loss continues.  Intervention: Patient educated on strategies for increased oral intake.  He was encouraged to continue to have easy to prepare foods for he and his wife.  Recommended patient try Ensure Plus or boost plus in small amounts and assess tolerance.  Provided patient with oral nutrition supplement samples, recipes, and coupons.  Questions were answered.  Teach back method used.  Monitoring, evaluation, goals: Patient will tolerate increased calories and protein along with oral nutrition supplements to minimize further weight loss.  Next visit: Patient agrees to check in with me for followup.

## 2013-11-11 ENCOUNTER — Other Ambulatory Visit: Payer: Self-pay | Admitting: Radiation Oncology

## 2013-11-11 DIAGNOSIS — C062 Malignant neoplasm of retromolar area: Secondary | ICD-10-CM

## 2013-11-11 MED ORDER — MAGIC MOUTHWASH W/LIDOCAINE: 5.0000 mL | Freq: Three times a day (TID) | ORAL | Status: DC | PRN

## 2013-12-12 ENCOUNTER — Ambulatory Visit
Admission: RE | Admit: 2013-12-12 | Discharge: 2013-12-12 | Disposition: A | Discharge status: Home / Self Care | Payer: Medicare Other | Source: Ambulatory Visit | Attending: Radiation Oncology | Admitting: Radiation Oncology

## 2013-12-12 ENCOUNTER — Encounter: Payer: Self-pay | Admitting: Radiation Oncology

## 2013-12-12 VITALS — BP 131/71 | HR 50 | Temp 98.6°F | Wt 130.3 lb

## 2013-12-12 DIAGNOSIS — C062 Malignant neoplasm of retromolar area: Secondary | ICD-10-CM

## 2013-12-12 NOTE — Progress Notes (Signed)
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  He has about 8 months out from treatment and states over the past 2 weeks he felt better than he has in the past 6 months. His appetite is back. He has a taste for sweet things. He is gaining weight and eating "like a horse". He doesn't have any pain or difficulty swallowing. He has not noticed any lumps or bumps in his neck. He is fatigue is also better. He is ready to be fitted for dentures. He saw Dr. Nicolette Bang a week ago. His wife is unfortunately continuing to decline. They're looking at hospice versus other treatment options for her.  Allergies:  Allergies  Allergen Reactions  . Cellcept (Mycophenolate Mofetil) Other (See Comments)    Reaction unknown  . Colcrys (Colchicine) Other (See Comments)    myalgias  . Erythromycin Other (See Comments)    Reaction unknown  . Methotrexate Derivatives Hives, Itching and Rash    Only in tablet form  . Neosporin (Neomycin-Bacitracin Zn-Polymyx) Rash  . Ultram (Tramadol) Rash    Medications:  Current Outpatient Prescriptions  Medication Sig Dispense Refill  . acyclovir (ZOVIRAX) 400 MG tablet Take 400 mg by mouth 2 (two) times daily.       [name] albuterol-ipratropium (COMBIVENT) 18-103 MCG/ACT inhaler Inhale 2 puffs into the lungs every 6 (six) hours as needed for wheezing.  1 Inhaler  2  . ALPRAZolam (XANAX) 0.5 MG tablet Take 0.5 mg by mouth 3 (three) times daily as needed for sleep.       [name] Alum & Mag Hydroxide-Simeth (MAGIC MOUTHWASH W/LIDOCAINE) SOLN Swish and spit 10 mLs 3 (three) times daily.      [name]  aspirin EC 81 MG tablet Take 81 mg by mouth at bedtime.       . bisoprolol (ZEBETA) 5 MG tablet Take 2 tablets (10 mg total) by mouth daily.      . cetirizine (ZYRTEC) 10 MG tablet Take 10 mg by mouth daily.      . Cholecalciferol (VITAMIN D) 1000 UNITS capsule Take 1,000 Units by mouth daily.      [name] desonide (DESOWEN) 0.05 % cream Apply 1 application topically 2 (two) times daily. Applied to face and behind ears.      . Dexamethasone 0.7 MG IMPL Place 1 each into both eyes every 6 (six) months.      . diazepam (VALIUM) 5 MG tablet Take 1 tablet (5 mg total) by mouth every 12 (twelve) hours as needed for anxiety. For anxiety  30 tablet  0  . dorzolamide-timolol (COSOPT) 22.3-6.8 MG/ML ophthalmic solution Place 1 drop into both eyes 2 (two) times daily.      [name] emollient (BIAFINE) cream Apply 1 application topically 2 (two) times daily. Apply to right side neck after rad tx and bedtime,/prn, except not 4 hours prior to rad txs, apply daily      . feeding supplement (ENSURE COMPLETE) LIQD Take 237 mLs by mouth 3 (three) times daily between meals.      . ferrous sulfate 325 (65 FE) MG tablet Take 1  tablet (325 mg total) by mouth 3 (three) times daily after meals.      [name] levothyroxine (SYNTHROID, LEVOTHROID) 200 MCG tablet Take 200 mcg by mouth daily.      . meclizine (ANTIVERT) 25 MG tablet Take 25 mg by mouth every 12 (twelve) hours as needed for dizziness.       . Multiple Vitamin (MULTIVITAMIN WITH MINERALS) TABS Take 1 tablet by mouth daily.      [name] oxyCODONE (ROXICODONE) 15 MG immediate release tablet Take 15 mg by mouth every 4 (four) hours as needed for pain.      . pantoprazole (PROTONIX) 40 MG tablet Take 40 mg by mouth daily.      . polyethylene glycol (MIRALAX / GLYCOLAX) packet Take 17 g by mouth daily.      . predniSONE (DELTASONE) 10 MG tablet Prednisone 40 mg po daily x 1 day then Prednisone 30 mg po daily x 1 day then Prednisone 20 mg po daily x 1 day then Prednisone 10 mg daily x 1  day then  Continue taking your usual dose of Prednisone 5 mg po daily as prescribed.  10 tablet  0  . predniSONE (DELTASONE) 5 MG tablet Take 5 mg by mouth daily.      . Rivaroxaban (XARELTO) 20 MG TABS Take 1 tablet (20 mg total) by mouth daily.  30 tablet  2  . sertraline (ZOLOFT) 100 MG tablet Take 100 mg by mouth 2 (two) times daily.      . simvastatin (ZOCOR) 40 MG tablet Take 40 mg by mouth every evening.      . Zinc 30 MG CAPS Take 1 capsule by mouth daily.      [name] zolpidem (AMBIEN) 10 MG tablet Take 5-10 mg by mouth at bedtime as needed. For sleep       No current facility-administered medications for this encounter.    Physical Exam:  Filed Vitals:   12/12/13 1314  BP: 131/71  Pulse: 50  Temp: 98.6 F (37 C)   Filed Weights   12/12/13 1314  Weight: 130 lb 4.8 oz (59.104 kg)   He is a pleasant male in no distress sitting comfortably examining table. He is cachectic. He has no palpable cervical or suprapubic or adenopathy. He has no evidence of recurrent disease over the right alveolar ridge retromolar trigone.  The posterior right base of tongue and tonsillar fossa are clear on indirect mirror examination as well as palpation with no evidence of disease. No evidence disease in the left tonsillar fossa or left base of tongue either.  IMPRESSION: [name] is a 73 y.o. male status post surgery and radiation for a node-positive oral cavity cancer with resolving acute effects of treatment  PLAN:   He looks better than I seen him. He has great energy and appetite. It he has no evidence of recurrence. I'll see him back in July. His primary care physician is monitoring his hypothyroidism. He will see Dr. Nicolette Bang in the interim.    Thea Silversmith, MD

## 2013-12-12 NOTE — Progress Notes (Signed)
Gary [name] here for fu appt for squamous cell carcinoma of the right retromolar trigone area.  He denies any pain nor difficulty swallowing.  He has a good appetite and "eats anything he want."   He states he has a marked decrease in fatigue.

## 2014-01-07 IMAGING — CR DG PORTABLE PELVIS
1 series · 1 of 1 positions shown · non-contrast
Comparison: Yesterday

CLINICAL DATA: Postop left hip pinning

PORTABLE PELVIS

[AP]
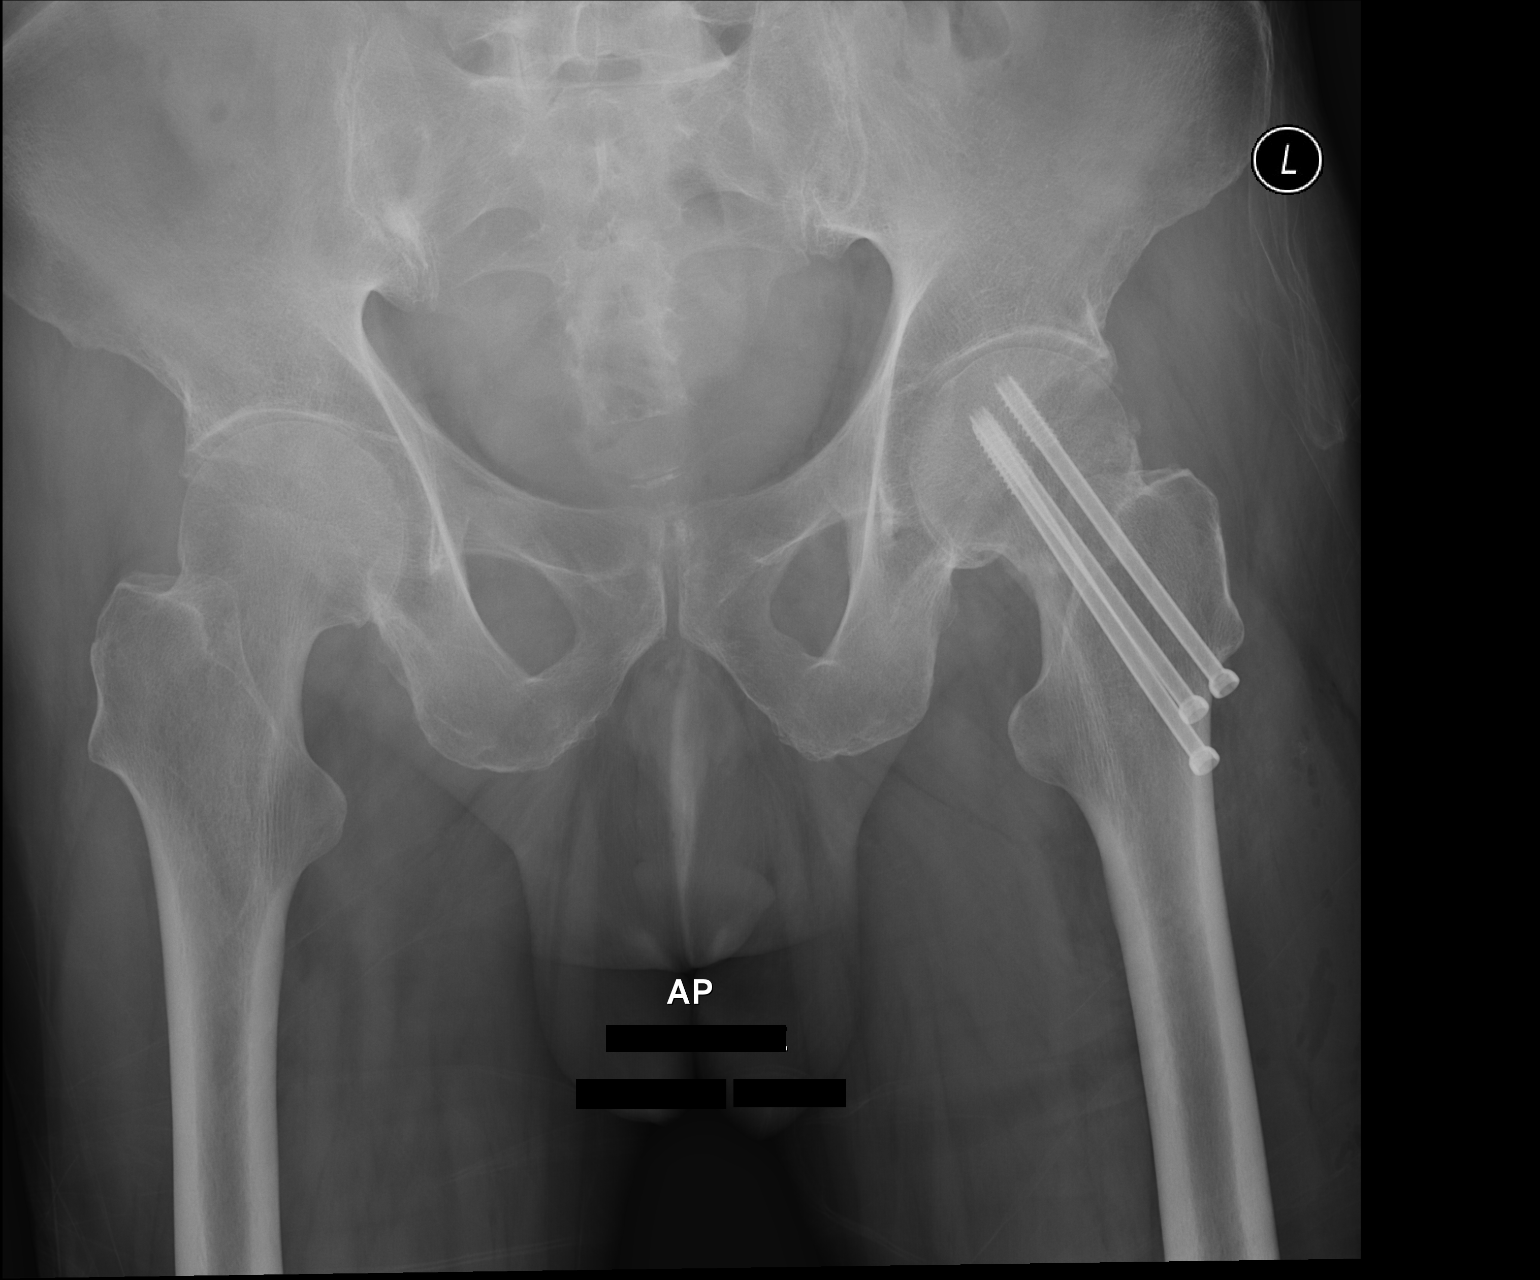

[1 of 1 positions shown; findings below may reference images not displayed]

FINDINGS: Three cancellous screws transfix a femoral neck fracture.
No breakage or loosening of the hardware.  Anatomic alignment.
IMPRESSION: ORIF left femoral neck fracture.

## 2014-04-06 DIAGNOSIS — C062 Malignant neoplasm of retromolar area: Secondary | ICD-10-CM

## 2014-04-06 DIAGNOSIS — Z09 Encounter for follow-up examination after completed treatment for conditions other than malignant neoplasm: Secondary | ICD-10-CM

## 2014-05-01 ENCOUNTER — Ambulatory Visit: Payer: Medicare Other | Admitting: Radiation Oncology

## 2014-05-15 ENCOUNTER — Ambulatory Visit
Admission: RE | Admit: 2014-05-15 | Discharge: 2014-05-15 | Disposition: A | Discharge status: Home / Self Care | Payer: Medicare Other | Source: Ambulatory Visit | Attending: Radiation Oncology | Admitting: Radiation Oncology

## 2014-05-15 VITALS — BP 138/75 | HR 56 | Temp 98.5°F | Wt 139.3 lb

## 2014-05-15 DIAGNOSIS — C062 Malignant neoplasm of retromolar area: Secondary | ICD-10-CM

## 2014-05-15 NOTE — Progress Notes (Signed)
Patient here for routine one year follow up completion of radiation to right retromolar trigone 60 Gy.wife died in 01-12-23 of this year.New dentures as of one month ago.Has new sore area over right lower jaw which developed after he started wearing dentures.states he saw Dr.Waltonen a few months ago but last note in care everywhere was  12/03/13.

## 2014-05-15 NOTE — Progress Notes (Signed)
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   He has about a year from treatment. His wife unfortunately passed away in 2023-01-02. That's been very difficult for him. He picked up a new pair of dentures about a month ago. After starting to use this he noticed a sore area in the back of his right mouth. Before he had his dentures adjusted he wanted to be evaluated here to make sure this was not recurrent cancer. His energy levels and appetite are improving. He has been offered counseling and the death of his wife but feels like he can deal with this himself. 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 He has not noticed any new lymphadenopathy in the neck.  Allergies:  Allergies  Allergen Reactions  . Cellcept (Mycophenolate Mofetil) Other (See Comments)    Reaction unknown  . Colcrys (Colchicine) Other (See Comments)    myalgias  . Erythromycin Other (See Comments)    Reaction unknown  . Methotrexate Derivatives Hives, Itching and Rash    Only in tablet form  . Neosporin (Neomycin-Bacitracin Zn-Polymyx) Rash  . Ultram (Tramadol) Rash    Medications:  Current Outpatient Prescriptions  Medication Sig Dispense Refill  . acyclovir (ZOVIRAX) 400 MG tablet Take 400 mg by mouth 2 (two) times daily.       [name] albuterol-ipratropium (COMBIVENT) 18-103 MCG/ACT inhaler Inhale 2 puffs into the lungs every 6 (six) hours as needed for wheezing.  1 Inhaler  2  . ALPRAZolam (XANAX) 0.5 MG tablet Take 0.5 mg by mouth 3 (three) times daily as needed for sleep.       [name] Alum & Mag  Hydroxide-Simeth (MAGIC MOUTHWASH W/LIDOCAINE) SOLN Swish and spit 10 mLs 3 (three) times daily.      [name] aspirin EC 81 MG tablet Take 81 mg by mouth at bedtime.       . bisoprolol (ZEBETA) 5 MG tablet Take 2 tablets (10 mg total) by mouth daily.      . cetirizine (ZYRTEC) 10 MG tablet Take 10 mg by mouth daily.      . Cholecalciferol (VITAMIN D) 1000 UNITS capsule Take 1,000 Units by mouth daily.      [name] desonide (DESOWEN) 0.05 % cream Apply 1 application topically 2 (two) times daily. Applied to face and behind ears.      . Dexamethasone 0.7 MG IMPL Place 1 each into both eyes every 6 (six) months.      . diazepam (VALIUM) 5 MG tablet Take 1 tablet (5 mg total) by mouth every 12 (twelve) hours as needed for anxiety. For anxiety  30 tablet  0  . dorzolamide-timolol (COSOPT) 22.3-6.8 MG/ML ophthalmic solution Place 1 drop into both eyes 2 (two) times daily.      [name] emollient (BIAFINE) cream Apply 1 application topically 2 (two) times daily. Apply to right side neck after rad tx and bedtime,/prn, except not 4 hours prior to rad txs, apply daily      . feeding supplement (ENSURE COMPLETE) LIQD Take 237 mLs by mouth 3 (three) times  daily between meals.      . ferrous sulfate 325 (65 FE) MG tablet Take 1 tablet (325 mg total) by mouth 3 (three) times daily after meals.      [name] levothyroxine (SYNTHROID, LEVOTHROID) 200 MCG tablet Take 200 mcg by mouth daily.      . meclizine (ANTIVERT) 25 MG tablet Take 25 mg by mouth every 12 (twelve) hours as needed for dizziness.       . Multiple Vitamin (MULTIVITAMIN WITH MINERALS) TABS Take 1 tablet by mouth daily.      [name] oxyCODONE (ROXICODONE) 15 MG immediate release tablet Take 15 mg by mouth every 4 (four) hours as needed for pain.      . pantoprazole (PROTONIX) 40 MG tablet Take 40 mg by mouth daily.      . polyethylene glycol (MIRALAX / GLYCOLAX) packet Take 17 g by mouth daily.      . predniSONE (DELTASONE) 10 MG tablet Prednisone 40 mg po daily x 1 day  then Prednisone 30 mg po daily x 1 day then Prednisone 20 mg po daily x 1 day then Prednisone 10 mg daily x 1 day then  Continue taking your usual dose of Prednisone 5 mg po daily as prescribed.  10 tablet  0  . predniSONE (DELTASONE) 5 MG tablet Take 5 mg by mouth daily.      . Rivaroxaban (XARELTO) 20 MG TABS Take 1 tablet (20 mg total) by mouth daily.  30 tablet  2  . sertraline (ZOLOFT) 100 MG tablet Take 100 mg by mouth 2 (two) times daily.      . simvastatin (ZOCOR) 40 MG tablet Take 40 mg by mouth every evening.      . Zinc 30 MG CAPS Take 1 capsule by mouth daily.      [name] zolpidem (AMBIEN) 10 MG tablet Take 5-10 mg by mouth at bedtime as needed. For sleep       No current facility-administered medications for this encounter.    Physical Exam:  Filed Vitals:   05/15/14 1436  BP: 138/75  Pulse: 56  Temp: 98.5 F (36.9 C)   Filed Weights   05/15/14 1436  Weight: 139 lb 4.8 oz (63.186 kg)   He is a pleasant male in no distress sitting comfortably examining table. He is cachectic. He has no palpable cervical or suprapubic or adenopathy. In the right retromolar trigone not associated with the mandible there is about a 3 mm white open area. This does not look like recurrent disease but instead looks like a sore. Again it does not look like it is arising from the mandible but instead from the soft tissue more medial to that..  The posterior right base of tongue and tonsillar fossa are clear on indirect mirror examination as well as palpation with no evidence of disease. No evidence disease in the left tonsillar fossa or left base of tongue either.  IMPRESSION: Gary [name] is a 73 y.o. male status post TORS and radiation for a node-positive oral cavity cancer with new painful area in the surgical bed  PLAN:   Other than the sore he is gaining weight and looking well. I believe this area is likely a result of his new dentures. He has not had teeth since he was in his 20s but still could be at  risk for radionecrosis. I've referred him to her dentist here at the [facility] for evaluation. If he feels that this may be concerning for recurrent disease we will  refer him back to Dr. Nicolette Bang for evaluation.   Thea Silversmith, MD

## 2014-05-20 ENCOUNTER — Ambulatory Visit (HOSPITAL_COMMUNITY): Payer: Self-pay | Admitting: Dentistry

## 2014-05-20 ENCOUNTER — Encounter (HOSPITAL_COMMUNITY): Payer: Self-pay | Admitting: Dentistry

## 2014-05-20 VITALS — BP 117/68 | HR 51 | Temp 98.3°F

## 2014-05-20 DIAGNOSIS — K062 Gingival and edentulous alveolar ridge lesions associated with trauma: Secondary | ICD-10-CM

## 2014-05-20 DIAGNOSIS — K08109 Complete loss of teeth, unspecified cause, unspecified class: Secondary | ICD-10-CM

## 2014-05-20 DIAGNOSIS — Z09 Encounter for follow-up examination after completed treatment for conditions other than malignant neoplasm: Secondary | ICD-10-CM

## 2014-05-20 DIAGNOSIS — Z972 Presence of dental prosthetic device (complete) (partial): Secondary | ICD-10-CM

## 2014-05-20 DIAGNOSIS — K0820 Unspecified atrophy of edentulous alveolar ridge: Secondary | ICD-10-CM

## 2014-05-20 DIAGNOSIS — Z923 Personal history of irradiation: Secondary | ICD-10-CM

## 2014-05-20 DIAGNOSIS — K000 Anodontia: Secondary | ICD-10-CM

## 2014-05-20 DIAGNOSIS — C062 Malignant neoplasm of retromolar area: Secondary | ICD-10-CM

## 2014-05-20 DIAGNOSIS — R29898 Other symptoms and signs involving the musculoskeletal system: Secondary | ICD-10-CM

## 2014-05-20 MED ORDER — CHLORHEXIDINE GLUCONATE 0.12 % MT SOLN: OROMUCOSAL | Status: DC

## 2014-05-20 NOTE — Patient Instructions (Addendum)
Patient is to keep lower denture out for 2 weeks. Patient to use chlorhexidine rinses 3 times daily as directed. Prescription was sent to [facility]. Patient is to return to clinic for reevaluation of exposed mandible on the lower right lingual side. Will consider referral to the wound center for hyperbaric oxygen therapy in the interim. Dr. Enrique Sack  RADIATION THERAPY AND DECISIONS REGARDING YOUR TEETH  Xerostomia (dry mouth) Your salivary glands may be in the filed of radiation.  Radiation may include all or part of your saliva glands.  This will cause your saliva to dry up and you will have a dry mouth.  The dry mouth will be for the rest of your life unless your radiation oncologist tells you otherwise.  Your saliva has many functions:  Saliva wets your tongue for speaking.  It coats your teeth and the inside of your mouth for easier movement.  It helps with chewing and swallowing food.  It helps clean away harmful acid and toxic products made by the germs in your mouth, therefore it helps prevent cavities.  It kills some germs in your mouth and helps to prevent gum disease.  It helps to carry flavor to your taste buds.  Once you have lost your saliva you will be at higher risk for tooth decay and gum disease.  What can be done to help improve your mouth when there's not enough saliva:  1.  Your dentist may give a prescription for Salagen.  It will not bring back all of your saliva but may bring back some of it.  Also your saliva may be thick and ropy or white and foamy. It will not feel like it use to feel.  2.  You will need to swish with water every time your mouth feels dry.  YOU CANNOT suck on any cough drops, mints, lemon drops, candy, vitamin C or any other products.  You cannot use anything other than water to make your mouth feel less dry.  If you want to drink anything else you have to drink it all at once and brush afterwards.  Be sure to discuss the details of your  diet habits with your dentist or hygienist.  Radiation caries: This is decay that happens very quickly once your mouth is very dry due to radiation therapy.  Normally cavities take six months to two years to become a problem.  When you have dry mouth cavities may take as little as eight weeks to cause you a problem.  This is why dental check ups every two months are necessary as long as you have a dry mouth. Radiation caries typically, but not always, start at your gum line where it is hard to see the cavity.  It is therefore also hard to fill these cavities adequately.  This high rate of cavities happens because your mouth no longer has saliva and therefore the acid made by the germs starts the decay process.  Whenever you eat anything the germs in your mouth change the food into acid.  The acid then burns a small hole in your tooth.  This small hole is the beginning of a cavity.  If this is not treated then it will grow bigger and become a cavity.  The way to avoid this hole getting bigger is to use fluoride every evening as prescribed by your dentist.  You have to make sure that your teeth are very clean before you use the fluoride.  This fluoride in turn will  strengthen your teeth and prepare them for another day of fighting acid.  If you develop radiation caries many times the damage is so large that you will have to have all your teeth removed.  This could be a big problem if some of these teeth are in the field of radiation.  Further details of why this could be a big problem will follow.  (See Osteoradionecrosis).  Loss of taste (dysgeusia) This happens to varying degrees once you've had radiation therapy to your jaw region.  Many times taste is not completely lost but becomes limited.  The loss of taste is mostly due to radiation affecting your taste buds.  However if you have no saliva in your mouth to carry the flavor to your taste buds it would be difficult for your taste buds to taste anything.   That is why using water or a prescription for Salagen prior to meals and during meals may help with some of the taste.  Keep in mind that taste generally returns very slowly over the course of several months or several years after radiation therapy.  Don't give up hope.  Trismus According to your Radiation Oncologist your TMJ or jaw joints are going to be partially or fully in the field of radiation.  This means that over time the muscles that help you open and close your mouth may get stiff.  This will potentially result in your not being able to open your mouth wide enough or as wide as you can open it now.  Le me give you an example of how slowly this happens and how unaware people are of it.  A gentlemen that had radiation therapy two years ago came back to me complaining that bananas are just too large for him to be able to fit them in between his teeth.  He was not able to open wide enough to bite into a banana.  This happens slowly and over a period of time.  What do we do to try and prevent this?  Your dentist will probably give you a stack of sticks called a trismus exercise device .  This stack will help your remind your muscles and your jaw joint to open up to the same distance every day.  Use these sticks every morning when you wake up according to the instructions given by the dentist.   You must use these sticks for at least one to two years after radiation therapy.  The reason for that is because it happens so slowly and keeps going on for about two years after radiation therapy.  Your hospital dentist will help you monitor your mouth opening and make sure that it's not getting smaller.  Osteoradionecrosis (ORN) This is a condition where your jaw bone after having had radiation therapy becomes very dry.  It has very little blood supply to keep it alive.  If you develop a cavity that turns into an abscess or an infection then the jaw bone does not have enough blood supply to help fight the  infection.  At this point it is very likely that the infection could cause the death of your jaw bone.  When you have dead bone it has to be removed.  Therefore you might end up having to have surgery to remove part of your jaw bone, the part of the jaw bone that has been affected.   Healing is also a problem if you are to have surgery in the areas where the bone has  had radiation therapy.  The same reasons apply.  If you have surgery you need more blood supply which is not available.  When blood supply and oxygen are not available again, there is a chance for the bone to die.  Occasionally ORN happens on its own with no obvious reason.  This is quite rare.  We believe that patients who continue to smoke and/or drink alcohol have a higher chance of having this bone problem.  Therefore once your jaw bone has had radiation therapy if there are any teeth in that area, you should never have them pulled.  You should also never have any surgery on your teeth or gums in that area unless the oral surgeon or Periodontist is aware of your history of radiation. There is some expensive management techniques that might be used to limit your risks.  The risks for ORN either from infection or spontaneous ( or on it's own) are life long.    TRISMUS  Trismus is a condition where the jaw does not allow the mouth to open as wide as it usually does.  This can happen almost suddenly, or in other cases the process is so slow, it is hard to notice it-until it is too far along.  When the jaw joints and/or muscles have been exposed to radiation treatments, the onset of Trismus is very slow.  This is because the muscles are losing their stretching ability over a long period of time, as long as 2 YEARS after the end of radiation.  It is therefore important to exercise these muscles and joints.  TRISMUS EXERCISES   Stack of tongue depressors measuring the same or a little less than the last documented MIO (Maximum Interincisal  Opening).  Secure them with a rubber band on both ends.  Place the stack in the patient's mouth, supporting the other end.  Allow 30 seconds for muscle stretching.  Rest for a few seconds.  Repeat 3-5 times  For all radiation patients, this exercise is recommended in the mornings and evenings unless otherwise instructed.  The exercise should be done for a period of 2 YEARS after the end of radiation.  MIO should be checked routinely on recall dental visits by the general dentist or the hospital dentist.  The patient is advised to report any changes, soreness, or difficulties encountered when doing the exercises.

## 2014-05-20 NOTE — Progress Notes (Signed)
DENTAL CONSULTATION  Date of Consultation:  05/20/2014 Patient Name:   Gary [name] Date of Birth:   22-Jan-1941 Medical Record Number: [id number]  VITALS: BP 117/68  Pulse 51  Temp(Src) 98.3 F (36.8 C) (Oral)   CHIEF COMPLAINT: Patient was referred by Dr. Pablo Ledger for a dental consultation to rule out exposed mandible/osteoradionecrosis.  HPI: Gary [name] is a 73 year old male with history of cancer of the right retromolar trigone in early 2014. Patient underwent surgical resection and neck dissection at [facility]. This was followed by radiation therapy from 03/11/2013 through 04/23/2013 with Dr. Pablo Ledger. The patient recently had upper and lower complete dentures fabricated and inserted by Dr. Mathews Robinsons in late June of 2015. Patient subsequently developed a painful, soft tissue lesion involving the lower right lingual alveolar ridge - posterior aspect.  Patient was then seen by Dr. Pablo Ledger for a followup examination due to the patient's concern of a possible cancer recurrence. Patient was found to have possible exposed bone involving the mandibular right lingual aspect of the jaw bone. Patient was then referred for evaluation to Dental Medicine to further evaluate the patient's condition and rule out dental etiology.  The patient indicates that he has been edentulous since the age of 22.  Patient has had multiple sets of upper and lower complete dentures. Patient recently had a new upper and lower complete denture fabricated by Dr. Mathews Robinsons that was inserted in late June of 2015. Patient subsequently developed probable denture irritation and a painful, soft tissue lesion involving the lower right lingual aspect in the area of tooth numbers 30-31.  This has now been present for the past 2-3 weeks.  The patient has been trying to keep the denture out as much as possible, but the painful area has not resolved.  Patient has not followed up with Dr. Michell Heinrich for denture adjustment due  to patient's concern of possible cancer recurrence. Patient indicates that he is using Biotene mouth rinses at this time.   PROBLEM LIST: Patient Active Problem List   Diagnosis Date Noted  . 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 . 03/26/2013  . CAP (community acquired pneumonia) 03/26/2013  . COPD with acute exacerbation 03/26/2013  . Diastolic dysfunction, with good LVF 2D Feb 2014 12/25/2012  . Chronic anticoagulation, new Feb 2014- Xarelto 12/25/2012  . PAF, new Feb 2014 with SSS component 12/24/2012  . 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 Hip fracture, left, S/P surgical repair 12/22/12 12/21/2012  . Coronary artery disease   . Anxiety   . Sleep apnea   . Chronic kidney disease. stage 2   . GERD (gastroesophageal reflux disease)   . Gout   . Anemia   . Hepatitis   . Chronic back pain   . Dyslipidemia   . Hypothyroid     PMH: Past Medical History  Diagnosis Date  . Coronary artery disease     stent placement 2000, does not see cardiologist at this time  . Hypertension     sees Dr. Deland Pretty  . Thyroid disease     Hypothyroidism  . Anxiety   . Sleep apnea     uses cpap machine  . Chronic kidney disease     renal insufficiency, [place] kidney  . GERD (gastroesophageal reflux disease)   . Arthritis     rhematoid arthritis  . Gout     pseudo gout  . Cancer     pre-cancerous lesions removed 11/22/12  and hx of  . Anemia     "chronic anemic"  . Hepatitis     hepatitis "C"  . Chronic back pain     hx of  . 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 Anxiety   . Hypertension   . Dyslipidemia   . Hypothyroid   . Insomnia     cpap  . Full dentures     Edentulous since age [age]  . Hearing impairment     job related, hbilat hearing aids  . History of radiation therapy 03/11/2013-04/23/2013    right retromolar trigone/54Gy/26fx    PSH: Past Surgical History  Procedure Laterality Date  . Tonsillectomy    .  Coronary angioplasty  2000    stent placement 2000  . Hip pinning,cannulated Left 12/22/2012    Procedure: CANNULATED HIP PINNING;  Surgeon: Mcarthur Rossetti, MD;  Location: [place];  Service: Orthopedics;  Laterality: Left;  . Radical neck dissection Right 01/22/13    [facility]  . Multiple dental extractions  ~ 1962    Edentulous    ALLERGIES: Allergies  Allergen Reactions  . Bee Venom Swelling  . Cellcept [Mycophenolate Mofetil] Other (See Comments)    Reaction unknown  . Colcrys [Colchicine] Other (See Comments)    myalgias  . Erythromycin Other (See Comments)    Reaction unknown  . Methotrexate Derivatives Hives, Itching and Rash    Only in tablet form  . Neosporin [Neomycin-Bacitracin Zn-Polymyx] Rash  . Ultram [Tramadol] Rash    MEDICATIONS: Current Outpatient Prescriptions  Medication Sig Dispense Refill  . acyclovir (ZOVIRAX) 400 MG tablet Take 400 mg by mouth 2 (two) times daily.       [name] ALPRAZolam (XANAX) 0.5 MG tablet Take 0.5 mg by mouth at bedtime as needed and may repeat dose one time if needed for sleep.       [name] Alum & Mag Hydroxide-Simeth (MAGIC MOUTHWASH W/LIDOCAINE) SOLN Take 5 mLs by mouth 3 (three) times daily as needed for mouth pain.  480 mL  1  . aspirin EC 81 MG tablet Take 81 mg by mouth at bedtime.       [name] atenolol (TENORMIN) 50 MG tablet Take 50 mg by mouth daily.      . Calcium Carbonate-Vitamin D (CALCIUM-D) 600-400 MG-UNIT TABS Take 1 mg by mouth daily. 600/400      . cetirizine (ZYRTEC) 10 MG tablet Take 10 mg by mouth daily.      . chlorhexidine (PERIDEX) 0.12 % solution Rinse with 15 mls three times a day for 30 seconds each after breakfast, lunch, and at bedtime. Spit out excess. Do not swallow.  1440 mL  prn  . desonide (DESOWEN) 0.05 % cream Apply 1 application topically 2 (two) times daily. Applied to face and behind ears.      . diazepam (VALIUM) 5 MG tablet Take 1 tablet (5 mg total) by mouth every 12 (twelve) hours as needed for  anxiety. For anxiety  30 tablet  0  . levothyroxine (SYNTHROID, LEVOTHROID) 200 MCG tablet Take 200 mcg by mouth daily.      . meclizine (ANTIVERT) 25 MG tablet Take 25 mg by mouth every 12 (twelve) hours as needed for dizziness.       . metroNIDAZOLE (METROGEL) 1 % gel Apply 1 application topically as needed.      . mirtazapine (REMERON) 15 MG tablet Take 15 mg by mouth at bedtime.      [name]  Multiple Vitamin (MULTIVITAMIN WITH MINERALS) TABS Take 1 tablet by mouth daily.      [name] omega-3 acid ethyl esters (LOVAZA) 1 G capsule Take 1 g by mouth 2 (two) times daily.      [name] oxyCODONE (ROXICODONE) 15 MG immediate release tablet Take 15 mg by mouth every 4 (four) hours as needed for pain.      . pantoprazole (PROTONIX) 40 MG tablet Take 40 mg by mouth daily.      . Potassium 99 MG TABS Take 1 mg by mouth daily.      . predniSONE (DELTASONE) 5 MG tablet Take 5 mg by mouth daily.      [name] rOPINIRole (REQUIP) 1 MG tablet       . sertraline (ZOLOFT) 100 MG tablet Take 100 mg by mouth 2 (two) times daily.      . simvastatin (ZOCOR) 40 MG tablet Take 40 mg by mouth every evening.      . Zinc 30 MG CAPS Take 1 capsule by mouth daily.       No current facility-administered medications for this visit.    LABS: Lab Results  Component Value Date   WBC 7.0 03/27/2013   HGB 10.3* 03/27/2013   HCT 32.9* 03/27/2013   MCV 90.6 03/27/2013   PLT 164 03/27/2013      Component Value Date/Time   NA 139 03/27/2013 0515   K 4.6 03/27/2013 0515   CL 107 03/27/2013 0515   CO2 26 03/27/2013 0515   GLUCOSE 151* 03/27/2013 0515   BUN 32.3* 09/17/2013 1328   BUN 20 03/27/2013 0515   CREATININE 1.5* 09/17/2013 1328   CREATININE 1.12 03/27/2013 0515   CALCIUM 9.5 03/27/2013 0515   GFRNONAA 64* 03/27/2013 0515   GFRAA 74* 03/27/2013 0515   Lab Results  Component Value Date   INR 1.18 03/26/2013   INR 1.54* 11/27/2010   No results found for this basename: PTT    SOCIAL HISTORY: History   Social History  . Marital Status:  Widowed    Spouse Name: N/A    Number of Children: 2  . Years of Education: N/A   Occupational History  . Retired     Engineer, building services   Social History Main Topics  . Smoking status: Former Smoker -- 1.00 packs/day for 27 years    Types: Cigarettes    Quit date: 11/08/1983  . Smokeless tobacco: Never Used  . Alcohol Use: No     Comment: "social"  . Drug Use: No  . Sexual Activity: Not on file   Other Topics Concern  . Not on file   Social History Narrative  . No narrative on file    FAMILY HISTORY: Family History  Problem Relation Age of Onset  . Coronary artery disease Brother 65  . Heart attack Brother   . Coronary artery disease Father     stents in 70's  . Cancer Mother     bone     DENTAL HISTORY: HPI: Gary [name] is a 73 year old male with history of cancer of the right retromolar trigone in early 2014. Patient underwent surgical resection and neck dissection at [facility]. This was followed by radiation therapy from 03/11/2013 through 04/23/2013 with Dr. Pablo Ledger. The patient recently had upper and lower complete dentures fabricated and inserted by Dr. Mathews Robinsons in late June of 2015. Patient subsequently developed a painful, soft tissue lesion involving the lower right lingual alveolar ridge - posterior aspect.  Patient was then seen  by Dr. Pablo Ledger for a followup examination due to the patient's concern of a possible cancer recurrence. Patient was found to have possible exposed bone involving the mandibular right lingual aspect of the jaw bone. Patient was then referred for evaluation to Dental Medicine to further evaluate the patient's condition and rule out dental etiology.  The patient indicates that he has been edentulous since the age of 83.  Patient has had multiple sets of upper and lower complete dentures. Patient recently had a new upper and lower complete denture fabricated by Dr. Mathews Robinsons that was inserted in late June of 2015. Patient  subsequently developed probable denture irritation and a painful, soft tissue lesion involving the lower right lingual aspect in the area of tooth numbers 30-31.  This has now been present for the past 2-3 weeks.  The patient has been trying to keep the denture out as much as possible, but the painful area has not resolved.  Patient has not followed up with Dr. Michell Heinrich for denture adjustment due to patient's concern of possible cancer recurrence. Patient indicates that he is using Biotene mouth rinses at this time.  DENTAL EXAMINATION:  GENERAL: The patient is a well-developed, slightly built male in no acute distress. HEAD AND NECK: The right neck is consistent with previous neck dissection. There is no left neck lymphadenopathy. The patient denies acute TMJ symptoms.  INTRAORAL EXAM:  The patient has moderate to severe xerostomia. There is a 2 mm round soft tissue lesion involving the right lateral tongue in the area of 30-31 .  There is a 6 x 9 mm round area of exposed mandible involving the lower right lingual aspect area numbers 30-31.  There is a surgical defect involving the lower right retromolar trigone area consistent with previous surgical resection and involves the floor of mouth, right lateral tongue, and retromolar trigone area. There are multiple pigmented areas involving the alveolar ridge soft tissue in the area of tooth numbers 18, 24, and 26/27. 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 DENTITION: The patient is edentulous. There is atrophy of the edentulous alveolar ridges. PROSTHODONTIC:  The patient has an upper and lower complete denture. The upper complete denture is stable and retentive. The lower complete denture is causing some denture irritation to the lower right lingual aspect. Pressure indicating paste was applied to lower  complete denture and adjusted as needed. The denture was then polished. The lower complete denture was found to be stable and retentive.  OCCLUSION: The occlusion of the upper and lower complete denture although not ideal, is acceptable at this time. No adjustments were made to the denture occlusion at this time. RADIOGRAPHIC INTERPRETATION: An orthopantogram was obtained today. The patient is edentulous. There is atrophy of edentulous alveolar ridges noted. Multiple radio-opacities are noted consistent with amalgam tattoos in the area of tooth numbers 18, 24, and 26/27 although through history there may be a contribution from previous spot welding extraorally. Multiple surgical clips are noted involving the right neck consistent with previous neck dissection.  ASSESSMENTS: 1. 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 Status post surgical resection at [facility] 3. Status post radiation therapy  4. Edentulous 5. Atrophy of edentulous alveolar ridges 6. Upper and lower complete dentures 7. Denture irritation 8. Exposed mandible involving the lower right lingual aspect area  30-31 that measures 6 x 9 mm. 9. Right lateral tongue ulceration measuring 2 mm round. 10. Presence of pigmented areas involving #18, 24, and 26/27 that are consistent with amalgam tattoos.   PLAN/RECOMMENDATIONS: 1. I discussed the risks, benefits, and complications of various treatment options with the patient in relationship to his medical and dental conditions, previous radiation therapy, and risk for osteoradionecrosis. We discussed various treatment options to include no treatment, denture adjustment, use of chlorhexidine rinses 3 times daily, possible therapy with Trental and vitamin E, partial ostectomy, referral to the [facility] for possible hyperbaric oxygen therapy, and referral to Oral surgeon and/or Prosthodontist for evaluation as indicated . The patient currently wishes to  proceed with use of chlorhexidine rinses 3 times daily and will keep lower complete denture out for 2 weeks. Patient will then be reevaluated for possible referral for hyperbaric oxygen therapy at that time. In the meantime, I will discuss the possible prescription for Trental twice daily as well as vitamin E 1000 units daily with Dr. Pablo Ledger.  2. Discussion of findings with medical team and coordination of future medical and dental care as needed.  I spent 75 minutes face to face with patient and more than 50% of time was spent in counseling and /or coordination of care.   Lenn Cal, DDS

## 2014-05-29 ENCOUNTER — Telehealth: Payer: Self-pay

## 2014-05-29 ENCOUNTER — Other Ambulatory Visit: Payer: Self-pay | Admitting: Radiation Oncology

## 2014-05-29 MED ORDER — PENTOXIFYLLINE ER 400 MG PO TBCR: 400.0000 mg | EXTENDED_RELEASE_TABLET | Freq: Two times a day (BID) | ORAL | Status: DC

## 2014-05-29 MED ORDER — VITAMIN E 1000 UNITS PO CAPS: 1000.0000 [IU] | ORAL_CAPSULE | Freq: Every day | ORAL | Status: DC

## 2014-05-29 NOTE — Telephone Encounter (Signed)
Spoke with patient to see if what Dr.Kulinski instructed for mouth was working and he states his mouth is feeling better.He is scheduled for follow up with Dr.Kulinski on Tuesday 06/03/14. Dr.Wentworh on vacation next week,  patient knows to call me if has any problems.

## 2014-05-29 NOTE — Progress Notes (Signed)
Will leave script for Vitamin E/Trental with nursing in case patient needs it pending visit with Dr. Enrique Sack next week. Wrote for Trental BID due to anitcoagulation issues.

## 2014-06-03 ENCOUNTER — Encounter (HOSPITAL_COMMUNITY): Payer: Self-pay | Admitting: Dentistry

## 2014-06-03 ENCOUNTER — Ambulatory Visit (HOSPITAL_COMMUNITY): Payer: Self-pay | Admitting: Dentistry

## 2014-06-03 VITALS — BP 113/63 | HR 53 | Temp 97.9°F

## 2014-06-03 DIAGNOSIS — K08109 Complete loss of teeth, unspecified cause, unspecified class: Secondary | ICD-10-CM

## 2014-06-03 DIAGNOSIS — C062 Malignant neoplasm of retromolar area: Secondary | ICD-10-CM

## 2014-06-03 DIAGNOSIS — R29898 Other symptoms and signs involving the musculoskeletal system: Secondary | ICD-10-CM

## 2014-06-03 DIAGNOSIS — Z09 Encounter for follow-up examination after completed treatment for conditions other than malignant neoplasm: Secondary | ICD-10-CM

## 2014-06-03 DIAGNOSIS — K000 Anodontia: Secondary | ICD-10-CM

## 2014-06-03 DIAGNOSIS — Z923 Personal history of irradiation: Secondary | ICD-10-CM

## 2014-06-03 DIAGNOSIS — Z972 Presence of dental prosthetic device (complete) (partial): Secondary | ICD-10-CM

## 2014-06-03 DIAGNOSIS — K0820 Unspecified atrophy of edentulous alveolar ridge: Secondary | ICD-10-CM

## 2014-06-03 NOTE — Patient Instructions (Addendum)
  PLAN/RECOMMENDATIONS: 1. I discussed the risks, benefits, and complications of various treatment options with the patient again in relationship to his medical and dental conditions, previous radiation therapy, and current exposed bone of the right mandible. We discussed various treatment options to include no treatment, utilizing Trental 400 mg therapy twice a day with vitamin E 1000 units daily, continued use of chlorhexidine rinses 3 times daily, referral to the [facility] for possible hyperbaric oxygen therapy, and referral to Oral surgeon and/or Prosthodontist for evaluation as indicated . The patient currently wishes to start the Trental and vitamin E therapy along with continued use of chlorhexidine rinses. The patient will keep his lower denture out.  The patient will return to clinic in 1 month for evaluation of healing. This patient refuses referral to the wound center, referral to the oral surgeon, and referral to the prosthodontist at this time. The patient will call if needed before then.   2. Discussion of findings with medical team and coordination of future medical and dental care as needed.   Lenn Cal, DDS   TRISMUS  Trismus is a condition where the jaw does not allow the mouth to open as wide as it usually does.  This can happen almost suddenly, or in other cases the process is so slow, it is hard to notice it-until it is too far along.  When the jaw joints and/or muscles have been exposed to radiation treatments, the onset of Trismus is very slow.  This is because the muscles are losing their stretching ability over a long period of time, as long as 2 YEARS after the end of radiation.  It is therefore important to exercise these muscles and joints.  TRISMUS EXERCISES   Stack of tongue depressors measuring the same or a little less than the last documented MIO (Maximum Interincisal Opening).  Secure them with a rubber band on both ends.  Place the stack in the  patient's mouth, supporting the other end.  Allow 30 seconds for muscle stretching.  Rest for a few seconds.  Repeat 3-5 times  For all radiation patients, this exercise is recommended in the mornings and evenings unless otherwise instructed.  The exercise should be done for a period of 2 YEARS after the end of radiation.  MIO should be checked routinely on recall dental visits by the general dentist or the hospital dentist.  The patient is advised to report any changes, soreness, or difficulties encountered when doing the exercises.

## 2014-06-03 NOTE — Progress Notes (Addendum)
Re-evaluation of Exposed mandible  Date     06/03/2014 Patient Name:   Gary [name] Date of Birth:   1941-04-06 Medical Record Number: [id number]  VITALS: BP 113/63  Pulse 53  Temp(Src) 97.9 F (36.6 C) (Oral)   Gary [name] is a 73 year old male with history of cancer of the right retromolar trigone in early 2014. Patient underwent surgical resection and neck dissection at [facility] This was followed by radiation therapy from 03/11/2013 through 04/23/2013 with Dr. Pablo Ledger. The patient recently had upper and lower complete dentures fabricated and inserted by Dr. Mathews Robinsons in late June of 2015. Patient subsequently developed a painful, soft tissue lesion involving the lower right lingual alveolar ridge - posterior aspect.  Patient was then seen by Dr. Pablo Ledger for a followup examination due to the patient's concern of a possible cancer recurrence. Patient was found to have possible exposed bone involving the mandibular right lingual aspect of the jaw bone. Patient was then referred for evaluation to Dental Medicine to further evaluate the patient's condition and rule out dental etiology on 7/11/11/13.  The lower denture was adjusted as needed and patient was instructed to keep dentures out until further instructions. The patient was also prescribe chlorhexidine gluconate rinses. Patient was to rinse with 15 ML's 3 times daily.    SUBJECTIVE: Gary [name] indicates the lower right lingual area feels "much improved" .  Patient indicates that he has not had any significant discomfort since he has been keeping his lower denture out. The patient is rinsing with chlorhexidine rinses 3 times daily. Patient is to pick up a prescription for the pentoxifylline from Dr. Misty Stanley. Patient will use the pentoxifylline 400 mg twice daily as instructed. Patient will pick up a bottle of vitamin E 1000 units to take on a once daily basis also.  OBJECTIVE: The patient is edentulous. The patient  has atrophy of the edentulous alveolar ridges. The patient has severe xerostomia. Patient has a persistent 6 x 9 mm area of exposed bone involving the right lingual alveolar ridge. There also is some food debris in this lingual sulcus area. The patient was instructed to perform better oral hygiene of this area. Patient was given a Monoject syringe to allow for better access to this area.   ASSESSMENT: 1. 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 2. The patient is edentulous 3. There is atrophy of the edentulous alveolar ridges. 4. The patient has severe xerostomia  PLAN/RECOMMENDATIONS: 1. I discussed the risks, benefits, and complications of various treatment options with the patient again in relationship to his medical and dental conditions, previous radiation therapy, and current exposed bone of the right mandible. We discussed various treatment options to include no treatment, utilizing Trental 400 mg therapy twice a day with vitamin E 1000 units daily, continued use of chlorhexidine rinses 3 times daily, referral to the [facility] for possible hyperbaric oxygen therapy, and referral to Oral surgeon and/or Prosthodontist for evaluation as indicated . The patient currently wishes to start the Trental and vitamin E therapy along with continued use of chlorhexidine rinses. The patient will keep his lower denture out.  The patient will return to clinic in 1 month for evaluation of healing. This patient refuses referral to the wound center, referral to the oral surgeon, and referral to the prosthodontist at this time. The patient will call if needed before then.   2. Discussion of findings with medical team and coordination of future  medical and dental care as needed.   Lenn Cal, DDS

## 2014-07-07 ENCOUNTER — Ambulatory Visit (HOSPITAL_COMMUNITY): Payer: Self-pay | Admitting: Dentistry

## 2014-07-07 ENCOUNTER — Encounter (HOSPITAL_COMMUNITY): Payer: Self-pay | Admitting: Dentistry

## 2014-07-07 VITALS — BP 115/59 | HR 48 | Temp 98.3°F

## 2014-07-07 DIAGNOSIS — Z972 Presence of dental prosthetic device (complete) (partial): Secondary | ICD-10-CM

## 2014-07-07 DIAGNOSIS — C062 Malignant neoplasm of retromolar area: Secondary | ICD-10-CM

## 2014-07-07 DIAGNOSIS — Z923 Personal history of irradiation: Secondary | ICD-10-CM

## 2014-07-07 DIAGNOSIS — K000 Anodontia: Secondary | ICD-10-CM

## 2014-07-07 DIAGNOSIS — K08109 Complete loss of teeth, unspecified cause, unspecified class: Secondary | ICD-10-CM

## 2014-07-07 DIAGNOSIS — K0820 Unspecified atrophy of edentulous alveolar ridge: Secondary | ICD-10-CM

## 2014-07-07 DIAGNOSIS — M278 Other specified diseases of jaws: Secondary | ICD-10-CM

## 2014-07-07 NOTE — Progress Notes (Signed)
Oral Examination and Re-evaluation of Exposed mandible  Date     07/07/2014 Patient Name:   Gary [name] Date of Birth:   1941/04/16 Medical Record Number: [id number]  VITALS: BP 115/59  Pulse 48  Temp(Src) 98.3 F (36.8 C) (Oral)   Gary [name] is a 73 year old male with history of cancer of the right retromolar trigone in early 2014. Patient underwent surgical resection and neck dissection at [facility] with Dr. Nicolette Bang. This was followed by radiation therapy from 03/11/2013 through 04/23/2013 with Dr. Pablo Ledger. The patient recently had upper and lower complete dentures fabricated and inserted by Dr. Mathews Robinsons in late June of 2015. Patient subsequently developed a painful, soft tissue lesion involving the lower right lingual alveolar ridge - posterior aspect.  Patient was then seen by Dr. Pablo Ledger for a followup examination due to the patient's concern of a possible cancer recurrence. Patient was found to have possible exposed bone involving the mandibular right lingual aspect of the jaw bone. Patient was then referred for evaluation to Dental Medicine to further evaluate the patient's condition and rule out dental etiology on 7/11/11/13.  The lower denture was adjusted as needed and patient was instructed to keep dentures out until further instructions. The patient was also prescribe chlorhexidine gluconate rinses. Patient was to rinse with 15 ML's 3 times daily. The patient was seen on 06/03/2014 for followup reexamination of the exposed mandible. A persistent 6 x 9 mm area of exposed bone was noted involving the mandibular right lingual alveolar ridge as before. Patient was started on Trental 400 mg twice daily along with vitamin E 1000 units daily at that time and was instructed to continue chlorhexidine rinses 3 times daily. Patient was also instructed to keep the dentures out at this time. The patient now presents for oral examination and reevaluation of the exposed mandible.    SUBJECTIVE: Gary [name] indicates the lower right lingual area feels "better" .  Patient indicates that he has not had any significant discomfort since he has been keeping his lower denture out. The patient is rinsing with chlorhexidine rinses 3 times daily. Patient has been taking the Trental 400 mg twice daily and the vitamin E 1000 units daily. The patient indicates that he is seeing Dr. Nicolette Bang tomorrow. Patient is also scheduled to see Dr. Pablo Ledger in one to 2 weeks.  OBJECTIVE: The patient is edentulous. The patient has atrophy of the edentulous alveolar ridges. The patient has severe xerostomia. Patient has a persistent 6 x 9 mm area of exposed bone involving the right lingual alveolar ridge. The exposed bone is above the level of the gum line.  The oral hygiene in this area is much improved with use of a Monoject syringe and salt water.   ASSESSMENT: 1. 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 2. The patient is edentulous 3. There is atrophy of the edentulous alveolar ridges. 4. The patient has severe xerostomia  PLAN/RECOMMENDATIONS: 1. I discussed the risks, benefits, and complications of various treatment options with the patient again in relationship to his medical and dental conditions, previous radiation therapy, and current exposed bone of the right mandible. We discussed various treatment options to include no treatment, continued utilization of the Trental 400 mg therapy twice a day with vitamin E 1000 units daily, continued use of chlorhexidine rinses 3 times daily, referral to the [facility] for possible hyperbaric oxygen therapy, and referral to Oral surgeon  and/or Prosthodontist for evaluation as indicated . The patient currently wishes to to discuss this with Dr. Nicolette Bang tomorrow and will then let me know if he wishes to be referred to the [facility] for hyperbaric oxygen  therapy, referred to a local oral surgeon or oral surgeon at [facility] for discussion of treatment options, or if he wishes to continue use of Trental and vitamin E therapy along with continued use of chlorhexidine rinses. The patient will keep his lower denture out.    2. Discussion of findings with medical team and coordination of future medical and dental care as needed.   Lenn Cal, DDS

## 2014-07-07 NOTE — Patient Instructions (Addendum)
Patient has an appointment with his head and neck surgeon tomorrow. Patient will discuss referral for hyperbaric oxygen therapy at that time. Patient also to consider referral to an oral surgeon at [facility] or an oral surgeon locally for evaluation of treatment options. Patient will then let me know if he wishes to be referred to the wound center for hyperbaric oxygen therapy at this time. Alternatively, the patient could continue on his Trental/vitamin E therapy and be seen for evaluation of exposed mandible in one to 2 months.  Lenn Cal, DDS

## 2014-07-18 ENCOUNTER — Ambulatory Visit
Admission: RE | Admit: 2014-07-18 | Discharge: 2014-07-18 | Disposition: A | Discharge status: Home / Self Care | Payer: Medicare Other | Source: Ambulatory Visit | Attending: Radiation Oncology | Admitting: Radiation Oncology

## 2014-07-18 VITALS — BP 135/68 | HR 57 | Temp 98.1°F | Wt 146.4 lb

## 2014-07-18 DIAGNOSIS — C062 Malignant neoplasm of retromolar area: Secondary | ICD-10-CM

## 2014-07-22 ENCOUNTER — Telehealth (HOSPITAL_COMMUNITY): Payer: Self-pay | Admitting: Dentistry

## 2014-07-22 NOTE — Telephone Encounter (Signed)
07/22/2014  Patient:            Gary [name] Date of Birth:  Sep 05, 1941 MRN:                [id number] [facility] to discuss his desire for referral to the wound center for hyperbaric oxygen therapy.  Lenn Cal, DDS

## 2014-07-22 NOTE — Progress Notes (Signed)
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  He has been on the chlorahexadine rinses and trental for about a month and a half. He has no pain. He discussed options for treatment with Dr. Enrique Sack and Dr. Nicolette Bang and is fine with conservative treatment for now. He is not wearing his bottom dentures. He continues to gain weight.   Allergies:  Allergies  Allergen Reactions  . Cellcept (Mycophenolate Mofetil) Other (See Comments)    Reaction unknown  . Colcrys (Colchicine) Other (See Comments)    myalgias  . Erythromycin Other (See Comments)    Reaction unknown  . Methotrexate Derivatives Hives, Itching and Rash    Only in tablet form  . Neosporin (Neomycin-Bacitracin Zn-Polymyx) Rash  . Ultram (Tramadol) Rash    Medications:  Current Outpatient Prescriptions  Medication Sig Dispense Refill  . acyclovir (ZOVIRAX) 400 MG tablet Take 400 mg by mouth 2 (two) times daily.       [name] albuterol-ipratropium (COMBIVENT) 18-103 MCG/ACT inhaler Inhale 2 puffs into the lungs every 6 (six) hours as needed for wheezing.  1 Inhaler  2  . ALPRAZolam (XANAX) 0.5 MG tablet Take 0.5 mg by mouth 3 (three) times daily as needed for sleep.       [name] Alum & Mag Hydroxide-Simeth (MAGIC MOUTHWASH W/LIDOCAINE) SOLN Swish and spit 10 mLs 3 (three) times daily.      [name] aspirin EC 81 MG tablet Take 81 mg by mouth at bedtime.       . bisoprolol (ZEBETA) 5 MG tablet Take 2 tablets (10 mg total) by mouth daily.      . cetirizine (ZYRTEC) 10 MG tablet Take 10 mg by mouth daily.      . Cholecalciferol (VITAMIN D) 1000 UNITS  capsule Take 1,000 Units by mouth daily.      [name] desonide (DESOWEN) 0.05 % cream Apply 1 application topically 2 (two) times daily. Applied to face and behind ears.      . Dexamethasone 0.7 MG IMPL Place 1 each into both eyes every 6 (six) months.      . diazepam (VALIUM) 5 MG tablet Take 1 tablet (5 mg total) by mouth every 12 (twelve) hours as needed for anxiety. For anxiety  30 tablet  0  . dorzolamide-timolol (COSOPT) 22.3-6.8 MG/ML ophthalmic solution Place 1 drop into both eyes 2 (two) times daily.      [name] emollient (BIAFINE) cream Apply 1 application topically 2 (two) times daily. Apply to right side neck after rad tx and bedtime,/prn, except not 4 hours prior to rad txs, apply daily      . feeding supplement (ENSURE COMPLETE) LIQD Take 237 mLs by mouth 3 (three) times daily between meals.      . ferrous sulfate 325 (65 FE) MG tablet Take 1 tablet (325 mg total) by mouth 3 (three) times daily after meals.      [name] levothyroxine (SYNTHROID, LEVOTHROID) 200 MCG tablet Take 200 mcg by mouth daily.      . meclizine (ANTIVERT) 25 MG tablet Take 25 mg by mouth every 12 (twelve) hours as  needed for dizziness.       . Multiple Vitamin (MULTIVITAMIN WITH MINERALS) TABS Take 1 tablet by mouth daily.      [name] oxyCODONE (ROXICODONE) 15 MG immediate release tablet Take 15 mg by mouth every 4 (four) hours as needed for pain.      . pantoprazole (PROTONIX) 40 MG tablet Take 40 mg by mouth daily.      . polyethylene glycol (MIRALAX / GLYCOLAX) packet Take 17 g by mouth daily.      . predniSONE (DELTASONE) 10 MG tablet Prednisone 40 mg po daily x 1 day then Prednisone 30 mg po daily x 1 day then Prednisone 20 mg po daily x 1 day then Prednisone 10 mg daily x 1 day then  Continue taking your usual dose of Prednisone 5 mg po daily as prescribed.  10 tablet  0  . predniSONE (DELTASONE) 5 MG tablet Take 5 mg by mouth daily.      . Rivaroxaban (XARELTO) 20 MG TABS Take 1 tablet (20 mg total) by mouth daily.  30  tablet  2  . sertraline (ZOLOFT) 100 MG tablet Take 100 mg by mouth 2 (two) times daily.      . simvastatin (ZOCOR) 40 MG tablet Take 40 mg by mouth every evening.      . Zinc 30 MG CAPS Take 1 capsule by mouth daily.      [name] zolpidem (AMBIEN) 10 MG tablet Take 5-10 mg by mouth at bedtime as needed. For sleep       No current facility-administered medications for this encounter.    Physical Exam:  Filed Vitals:   07/18/14 1502  BP: 135/68  Pulse: 57  Temp: 98.1 F (36.7 C)   Filed Weights   07/18/14 1502  Weight: 146 lb 6.4 oz (66.407 kg)   He is a pleasant male in no distress sitting comfortably examining table. He is cachectic. He has no palpable cervical or suprapubic or adenopathy. He has a 8 mm x 4 mm area of exposed bone in the medial aspect of the right mandible. This is tender to palpation with the tongue depressor. No other evidence of complications or recurrent disease.   IMPRESSION: Gary [name] is a 73 y.o. male status post TORS and radiation for a node-positive oral cavity cancer with new painful area in the surgical bed  PLAN:   He is feeling well and does not want to pursue any more aggressive treatment options. He is seeing Dr. Enrique Sack next week and Dr. Nicolette Bang in a month. I will see him back in 3 months. He can call any time with questions.   Thea Silversmith, MD

## 2014-09-02 ENCOUNTER — Other Ambulatory Visit: Payer: Self-pay | Admitting: Radiation Oncology

## 2014-09-08 ENCOUNTER — Telehealth: Payer: Self-pay | Admitting: Oncology

## 2014-09-08 NOTE — Telephone Encounter (Signed)
Jaaron called asking for a refill on pentoxifylline (TRENTAL) 400 MG CR tablets.  He last had it filled on 05/29/14.  He has one tablet left.  He said that he has switched his pharmacy to the [facility] on [address].  He is requesting a call back in the morning at [phone].

## 2014-09-09 ENCOUNTER — Telehealth: Payer: Self-pay

## 2014-09-09 NOTE — Telephone Encounter (Signed)
Patient called to inform  requested refill script for trental has been sent electronically to [facility].

## 2014-10-16 ENCOUNTER — Encounter: Payer: Self-pay | Admitting: Radiation Oncology

## 2014-10-16 ENCOUNTER — Ambulatory Visit
Admission: RE | Admit: 2014-10-16 | Discharge: 2014-10-16 | Disposition: A | Discharge status: Home / Self Care | Payer: Medicare Other | Source: Ambulatory Visit | Attending: Radiation Oncology | Admitting: Radiation Oncology

## 2014-10-16 VITALS — BP 138/79 | HR 53 | Temp 98.9°F | Resp 20 | Ht 72.0 in | Wt 144.7 lb

## 2014-10-16 DIAGNOSIS — C062 Malignant neoplasm of retromolar area: Secondary | ICD-10-CM

## 2014-10-16 NOTE — Progress Notes (Addendum)
Follow up c/p radiation tx right retromolar trigone completd 04/23/13, has dry mouth still, eating most soft foods, not wearing his dentures, no difficulty swallowing food or fluids, main c/o insomnia, not wearing his c-pap, takes, ambien, xanax and valium not helping stated patient, peridex rinses tid, not doing his trismus exercises, was at [facility] Dr. Shelia Media 09/09/14 Very fatigued, no pain at present,took his pain med before coming here , is taking his trental bid 1:23 PM

## 2014-10-16 NOTE — Progress Notes (Signed)
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  He has been on the chlorahexadine rinses and trental/Vitamin E for about 5 months. Dr. Nicolette Bang debrided a small piece of bone at his last office visit. He has no pain. He is still eating soft foods and not wearing his bottom dentures. He continues to gain weight. He has problems with insomnia which is a chronic problem for him. He was tried on trazadone which he did not tolerate.   Allergies:  Allergies  Allergen Reactions  . Cellcept (Mycophenolate Mofetil) Other (See Comments)    Reaction unknown  . Colcrys (Colchicine) Other (See Comments)    myalgias  . Erythromycin Other (See Comments)    Reaction unknown  . Methotrexate Derivatives Hives, Itching and Rash    Only in tablet form  . Neosporin (Neomycin-Bacitracin Zn-Polymyx) Rash  . Ultram (Tramadol) Rash    Medications:  Current Outpatient Prescriptions  Medication Sig Dispense Refill  . acyclovir (ZOVIRAX) 400 MG tablet Take 400 mg by mouth 2 (two) times daily.       [name] albuterol-ipratropium (COMBIVENT) 18-103 MCG/ACT inhaler Inhale 2 puffs into the lungs every 6 (six) hours as needed for wheezing.  1 Inhaler  2  . ALPRAZolam (XANAX) 0.5 MG tablet Take 0.5 mg by mouth 3 (three) times daily as needed for sleep.       [name] Alum & Mag Hydroxide-Simeth (MAGIC MOUTHWASH W/LIDOCAINE) SOLN Swish and spit 10 mLs 3 (three) times daily.      [name] aspirin EC 81 MG tablet Take 81 mg by mouth at bedtime.       . bisoprolol (ZEBETA) 5 MG tablet Take 2 tablets (10 mg total) by mouth daily.      . cetirizine  (ZYRTEC) 10 MG tablet Take 10 mg by mouth daily.      . Cholecalciferol (VITAMIN D) 1000 UNITS capsule Take 1,000 Units by mouth daily.      [name] desonide (DESOWEN) 0.05 % cream Apply 1 application topically 2 (two) times daily. Applied to face and behind ears.      . Dexamethasone 0.7 MG IMPL Place 1 each into both eyes every 6 (six) months.      . diazepam (VALIUM) 5 MG tablet Take 1 tablet (5 mg total) by mouth every 12 (twelve) hours as needed for anxiety. For anxiety  30 tablet  0  . dorzolamide-timolol (COSOPT) 22.3-6.8 MG/ML ophthalmic solution Place 1 drop into both eyes 2 (two) times daily.      [name] emollient (BIAFINE) cream Apply 1 application topically 2 (two) times daily. Apply to right side neck after rad tx and bedtime,/prn, except not 4 hours prior to rad txs, apply daily      . feeding supplement (ENSURE COMPLETE) LIQD Take 237 mLs by mouth 3 (three) times daily between meals.      . ferrous sulfate 325 (65 FE) MG tablet Take 1 tablet (325 mg total) by mouth 3 (three) times daily after meals.      [name] levothyroxine (SYNTHROID, LEVOTHROID) 200 MCG tablet Take 200 mcg by mouth daily.      [name]  meclizine (ANTIVERT) 25 MG tablet Take 25 mg by mouth every 12 (twelve) hours as needed for dizziness.       . Multiple Vitamin (MULTIVITAMIN WITH MINERALS) TABS Take 1 tablet by mouth daily.      [name] oxyCODONE (ROXICODONE) 15 MG immediate release tablet Take 15 mg by mouth every 4 (four) hours as needed for pain.      . pantoprazole (PROTONIX) 40 MG tablet Take 40 mg by mouth daily.      . polyethylene glycol (MIRALAX / GLYCOLAX) packet Take 17 g by mouth daily.      . predniSONE (DELTASONE) 10 MG tablet Prednisone 40 mg po daily x 1 day then Prednisone 30 mg po daily x 1 day then Prednisone 20 mg po daily x 1 day then Prednisone 10 mg daily x 1 day then  Continue taking your usual dose of Prednisone 5 mg po daily as prescribed.  10 tablet  0  . predniSONE (DELTASONE) 5 MG tablet Take 5 mg by mouth  daily.      . Rivaroxaban (XARELTO) 20 MG TABS Take 1 tablet (20 mg total) by mouth daily.  30 tablet  2  . sertraline (ZOLOFT) 100 MG tablet Take 100 mg by mouth 2 (two) times daily.      . simvastatin (ZOCOR) 40 MG tablet Take 40 mg by mouth every evening.      . Zinc 30 MG CAPS Take 1 capsule by mouth daily.      [name] zolpidem (AMBIEN) 10 MG tablet Take 5-10 mg by mouth at bedtime as needed. For sleep       No current facility-administered medications for this encounter.    Physical Exam:  Filed Vitals:   10/16/14 1314  BP: 138/79  Pulse: 53  Temp: 98.9 F (37.2 C)  Resp: 20   Filed Weights   10/16/14 1314  Weight: 144 lb 11.2 oz (65.635 kg)   He is a pleasant male in no distress sitting comfortably examining table. He looks healthy and well. He has no palpable cervical or suprapubic or adenopathy. The area of exposed bone in the medial aspect of the right mandible has healed well and is covered with mucosa now. This is not tender to palpation. No other evidence of complications or recurrent disease.   IMPRESSION: Gary [name] is a 73 y.o. 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 I've encouraged him to hold off on his dentures for now and also continue the vitamin e/trental for another month along with the rinses.  He is seeing Dr. Nicolette Bang at the end of the month. If he is cleared by Nicolette Bang he could make an appointment with Dr. Tommie Raymond at some point in [place] to discuss re-introducting his dentures, carefully and with a better fit if he feels it is appropriate. Certainly with minimal use at first. I will see him back in March.   Thea Silversmith, MD

## 2014-11-11 DIAGNOSIS — K117 Disturbances of salivary secretion: Secondary | ICD-10-CM | POA: Diagnosis not present

## 2014-11-11 DIAGNOSIS — Z85818 Personal history of malignant neoplasm of other sites of lip, oral cavity, and pharynx: Secondary | ICD-10-CM | POA: Diagnosis not present

## 2014-11-11 DIAGNOSIS — Z87891 Personal history of nicotine dependence: Secondary | ICD-10-CM | POA: Diagnosis not present

## 2014-11-11 DIAGNOSIS — Z923 Personal history of irradiation: Secondary | ICD-10-CM | POA: Diagnosis not present

## 2014-11-11 DIAGNOSIS — C062 Malignant neoplasm of retromolar area: Secondary | ICD-10-CM | POA: Diagnosis not present

## 2014-11-18 DIAGNOSIS — H35353 Cystoid macular degeneration, bilateral: Secondary | ICD-10-CM | POA: Diagnosis not present

## 2014-11-18 DIAGNOSIS — H3093 Unspecified chorioretinal inflammation, bilateral: Secondary | ICD-10-CM | POA: Diagnosis not present

## 2014-11-20 ENCOUNTER — Encounter (HOSPITAL_COMMUNITY): Payer: Self-pay | Admitting: Otolaryngology

## 2014-11-25 ENCOUNTER — Telehealth: Payer: Self-pay | Admitting: *Deleted

## 2014-11-25 ENCOUNTER — Other Ambulatory Visit: Payer: Self-pay | Admitting: Radiation Oncology

## 2014-11-25 DIAGNOSIS — C062 Malignant neoplasm of retromolar area: Secondary | ICD-10-CM

## 2014-11-25 NOTE — Telephone Encounter (Signed)
Called patient to inform of dental appt. With Dr. Enrique Sack on 12-02-14 @ 11:15 am, lvm for a return call

## 2014-12-02 ENCOUNTER — Encounter (HOSPITAL_COMMUNITY): Payer: Self-pay | Admitting: Dentistry

## 2014-12-03 ENCOUNTER — Encounter (HOSPITAL_COMMUNITY): Payer: Self-pay | Admitting: Dentistry

## 2014-12-03 ENCOUNTER — Ambulatory Visit (HOSPITAL_COMMUNITY): Payer: Self-pay | Admitting: Dentistry

## 2014-12-03 VITALS — BP 158/71 | HR 42 | Temp 98.0°F

## 2014-12-03 DIAGNOSIS — K117 Disturbances of salivary secretion: Secondary | ICD-10-CM

## 2014-12-03 DIAGNOSIS — K000 Anodontia: Secondary | ICD-10-CM

## 2014-12-03 DIAGNOSIS — K0820 Unspecified atrophy of edentulous alveolar ridge: Secondary | ICD-10-CM

## 2014-12-03 DIAGNOSIS — K08109 Complete loss of teeth, unspecified cause, unspecified class: Secondary | ICD-10-CM

## 2014-12-03 DIAGNOSIS — C062 Malignant neoplasm of retromolar area: Secondary | ICD-10-CM

## 2014-12-03 DIAGNOSIS — R682 Dry mouth, unspecified: Secondary | ICD-10-CM

## 2014-12-03 DIAGNOSIS — R29898 Other symptoms and signs involving the musculoskeletal system: Secondary | ICD-10-CM

## 2014-12-03 DIAGNOSIS — Z923 Personal history of irradiation: Secondary | ICD-10-CM

## 2014-12-03 DIAGNOSIS — Z972 Presence of dental prosthetic device (complete) (partial): Secondary | ICD-10-CM

## 2014-12-03 NOTE — Patient Instructions (Signed)
ASSESSMENT: 1. 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  This is now completely covered by soft tissue. 2. The patient is edentulous 3. There is atrophy of the edentulous alveolar ridges. 4. The patient has severe xerostomia. 5. The patient has a postsurgical defect involving the lower right lingual floor of mouth and lingual vestibule. 6. Less than ideal upper and lower complete dentures with need for evaluation by a prosthodontist for the following:     A. Consideration of implant therapy and possible fabrication of a new upper and lower complete dentures.     B. Evaluation to determine the acceptabilty of the upper and lower complete dentures as is.     C. Evaluation for possible lab reline procedures with the prosthodontist if indicated and is agreeable to the prosthodontist.     D. Evaluation by the prosthodontist to determine if the lower denture should be even worn secondary to potential for persistent risk for osteoradionecrosis of the mandible.  PLAN/RECOMMENDATIONS: 1. I discussed the risks, benefits, and complications of various treatment options with the patient again in relationship to his medical and dental conditions, previous radiation therapy, and history of exposed bone of the right mandible, edentulous condition, severe atrophy of the edentulous alveolar ridges, and postsurgical defect, and less than ideal upper lower complete dentures. We discussed the continued utilization of the Trental 400 mg therapy twice a day with vitamin E 1000 units daily along with continued use of chlorhexidine rinses twice a day. We discussed referral to a prosthodontist for the above evaluations as listed. We discussed follow-up with his previous dentist, Dr. Mathews Robinsons. The patient currently wishes to be referred to Dr. Vernard Gambles, prosthodontist, for dental consultation and discussion of possible treatment options.  Release of information was obtained  and records will be forwarded to Dr. Hardin Negus. In the meantime, the patient is to start using upper lower complete dentures carefully and will keep dentures out as much as possible. Patient is to watch for possible denture irritation and recurrence of the exposed bone. The patient has been scheduled for follow-up evaluation in 1 week.  2. Discussion of findings with medical team and coordination of future medical and dental care as needed.   Lenn Cal, DDS

## 2014-12-03 NOTE — Progress Notes (Signed)
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 Patient underwent surgical resection and neck dissection at [facility] with Dr. Nicolette Bang. This was followed by radiation therapy from 03/11/2013 through 04/23/2013 with Dr. Pablo Ledger. The patient recently had upper and lower complete dentures fabricated and inserted by Dr. Mathews Robinsons in late June of 2015. Patient subsequently developed a painful, soft tissue lesion involving the lower right lingual alveolar ridge - posterior aspect.  Patient was then seen by Dr. Pablo Ledger for a followup examination due to the patient's concern of a possible cancer recurrence. Patient was found to have possible exposed bone involving the mandibular right lingual aspect of the jaw bone. Patient was then referred for evaluation to Dental Medicine to further evaluate the patient's condition and rule out dental etiology on 7/11/11/13.  At that time, exposed bone was noted and the lower denture was adjusted as needed and patient was instructed to keep dentures out until further instructions. The patient was also prescribe chlorhexidine gluconate rinses. Patient was to rinse with 15 ML's 3 times daily. The patient was seen on 06/03/2014 for followup reexamination of the exposed mandible. A persistent 6 x 9 mm area of exposed bone was noted involving the mandibular right lingual alveolar ridge as before. Patient was started on Trental 400 mg twice daily along with vitamin E 1000 units daily at that time and was instructed to continue chlorhexidine rinses 3 times daily. Patient was also instructed to keep the dentures out at that time. The patient was then seen on follow-up on 07/07/2014 with persistent exposed bone. Patient  was then considered for hyperbaric option therapy. Patient followed up with Dr. Nicolette Bang at [facility] and no hyperbaric oxygen therapy was recommended at that time. A bony sequestrum was finally removed by Dr. Nicolette Bang in December 2015. The patient was then seen by Dr. Pablo Ledger in early 2016 and referred to dental medicine for evaluation and assistance in reinserting the upper and lower complete dentures for use. The patient now presents for oral examination and reevaluation of the exposed mandible and adjustment of dentures as needed. Patient did not wish to follow-up with Dr. Mathews Robinsons, the original dentist that fabricated the upper and lower complete dentures at this time.   SUBJECTIVE: Gary [name] indicates the lower right lingual area feels "much better" and is not aware of any exposed bone.  Patient indicates that he has not had any significant discomfort since he has been keeping his lower denture out. The patient is rinsing with chlorhexidine rinses 3 times daily. Patient has been taking the Trental 400 mg twice daily and the vitamin E 1000 units daily. The patient indicates that he has been wearing the upper dentures for approximately 2 weeks without problems. The patient is interested in seeing if he can start using the lower denture again.   Examination:  General: The patient is a tall, slightly built male in no acute distress. Head and neck: The right neck is consistent with previous neck dissection. There is no left neck lymphadenopathy. The patient denies acute TMJ symptoms. Intraoral exam: The patient has severe xerostomia. There is no evidence of previously exposed lower right lingual mandible.The patient has a post-surgical  defect in the area of the previous retromolar trigone surgery with some tethering of the lingual tissues and floor of mouth on the lower right lingual aspect. The oral hygiene in this area is much improved with use of a Monoject syringe and salt  water. Dentition: The patient is edentulous. There is atrophy of the edentulous alveolar ridges. Prosthodontics: The patient has an existing upper and lower complete dentures. The dentures appear to be relatively stable and retentive. Ideally, they may need lab reline or replacement. Pressure indicating paste was applied to the upper and lower complete dentures. Adjustments were made as needed. Thick pressure indicating paste was applied to the denture borders and adjustments were made as needed. Pressure indicating paste was reapplied and mouth movements were simulated to further evaluate denture borders. Adjustments were made as needed. Dentures were again polished. Patient accepts the results. Occlusion: Occlusion of the upper and lower complete dentures were evaluated at this time. Adjustments were made as needed. Patient has maxillary and mandibular incisal contact and has maximum intercuspation position which is less than ideal.  ASSESSMENT: 1. 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  This is now completely covered by soft tissue. 2. The patient is edentulous 3. There is atrophy of the edentulous alveolar ridges. 4. The patient has severe xerostomia. 5. The patient has a postsurgical defect involving the lower right lingual floor of mouth and lingual vestibule. 6. Less than ideal upper and lower complete dentures with need for evaluation by a prosthodontist for the following:     A. Consideration of implant therapy and possible fabrication of a new upper and lower complete dentures.     B. Evaluation to determine the acceptabilty of the upper and lower complete dentures as is.     C. Evaluation for possible lab reline procedures with the prosthodontist if indicated and is agreeable to the prosthodontist.     D. Evaluation by the prosthodontist to determine if the lower denture should be even worn secondary to potential for persistent risk for  osteoradionecrosis of the mandible.  PLAN/RECOMMENDATIONS: 1. I discussed the risks, benefits, and complications of various treatment options with the patient again in relationship to his medical and dental conditions, previous radiation therapy, and history of exposed bone of the right mandible, edentulous condition, severe atrophy of the edentulous alveolar ridges, and postsurgical defect, and less than ideal upper lower complete dentures. We discussed the continued utilization of the Trental 400 mg therapy twice a day with vitamin E 1000 units daily along with continued use of chlorhexidine rinses twice a day. We discussed referral to a prosthodontist for the above evaluations as listed. We discussed follow-up with his previous dentist, Dr. Mathews Robinsons. The patient currently wishes to be referred to Dr. Vernard Gambles, prosthodontist, for dental consultation and discussion of possible treatment options.  Release of information was obtained and records will be forwarded to Dr. Hardin Negus. In the meantime, the patient is to start using upper lower complete dentures carefully and will keep dentures out as much as possible. Patient is to watch for possible denture irritation and recurrence of the exposed bone. The patient has been scheduled for follow-up evaluation in 1 week.  2. Discussion of findings with medical team and coordination of future medical and dental care as needed.   Lenn Cal, DDS

## 2014-12-08 DIAGNOSIS — F5104 Psychophysiologic insomnia: Secondary | ICD-10-CM | POA: Diagnosis not present

## 2014-12-10 ENCOUNTER — Encounter (HOSPITAL_COMMUNITY): Payer: Self-pay | Admitting: Dentistry

## 2014-12-10 ENCOUNTER — Ambulatory Visit (HOSPITAL_COMMUNITY): Payer: Self-pay | Admitting: Dentistry

## 2014-12-10 VITALS — BP 153/76 | HR 44 | Temp 97.8°F

## 2014-12-10 DIAGNOSIS — R682 Dry mouth, unspecified: Secondary | ICD-10-CM

## 2014-12-10 DIAGNOSIS — Z972 Presence of dental prosthetic device (complete) (partial): Secondary | ICD-10-CM

## 2014-12-10 DIAGNOSIS — K08109 Complete loss of teeth, unspecified cause, unspecified class: Secondary | ICD-10-CM

## 2014-12-10 DIAGNOSIS — K117 Disturbances of salivary secretion: Secondary | ICD-10-CM

## 2014-12-10 DIAGNOSIS — Z463 Encounter for fitting and adjustment of dental prosthetic device: Secondary | ICD-10-CM

## 2014-12-10 DIAGNOSIS — Z923 Personal history of irradiation: Secondary | ICD-10-CM

## 2014-12-10 DIAGNOSIS — R29898 Other symptoms and signs involving the musculoskeletal system: Secondary | ICD-10-CM

## 2014-12-10 DIAGNOSIS — C062 Malignant neoplasm of retromolar area: Secondary | ICD-10-CM

## 2014-12-10 DIAGNOSIS — K0820 Unspecified atrophy of edentulous alveolar ridge: Secondary | ICD-10-CM

## 2014-12-10 DIAGNOSIS — K000 Anodontia: Secondary | ICD-10-CM

## 2014-12-10 NOTE — Patient Instructions (Signed)
ASSESSMENT: 1. 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  This is now completely covered by soft tissue. 2. The patient is edentulous 3. There is atrophy of the edentulous alveolar ridges. 4. The patient has severe xerostomia. 5. The patient has a postsurgical defect involving the lower right lingual floor of mouth and lingual vestibule. 6. Less than ideal upper and lower complete dentures with need for evaluation by a prosthodontist for the following:     A. Consideration of implant therapy and possible fabrication of a new upper and lower complete dentures.     B. Evaluation to determine the acceptabilty of the upper and lower complete dentures as is.     C. Evaluation for possible lab reline procedures with the prosthodontist if indicated and is agreeable to the prosthodontist.     D. Evaluation by the prosthodontist to determine if the lower denture should be even worn secondary to potential for persistent risk for osteoradionecrosis of the mandible.  PLAN/RECOMMENDATIONS: 1. I have left a message with Dr. Vernard Gambles concerning the date of anticipated consultation appointment. Patient was also given Dr. Vernard Gambles' office number to call and schedule consultation appointment. Patient is aware of the need to determine the overall ability of the patient to even wear upper and lower complete dentures at this time. The patient expresses understanding and will wear the upper lower complete dentures cautiously and keep dentures out if sore spots arise. We discussed the continued utilization of the Trental 400 mg therapy twice a day with vitamin E 1000 units daily along with continued use of chlorhexidine rinses twice a day. Patient is to watch for possible denture irritation and recurrence of the exposed bone. The patient has been scheduled for follow-up evaluation in 2 weeks.  2. Discussion of findings with medical team and coordination of future  medical and dental care as needed.   Lenn Cal, DDS

## 2014-12-10 NOTE — Progress Notes (Signed)
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 Patient underwent surgical resection and neck dissection at [facility] with Dr. Nicolette Bang. This was followed by radiation therapy from 03/11/2013 through 04/23/2013 with Dr. Pablo Ledger. The patient recently had upper and lower complete dentures fabricated and inserted by Dr. Mathews Robinsons in late June of 2015. Patient subsequently developed a painful, soft tissue lesion involving the lower right lingual alveolar ridge - posterior aspect.  Patient was then seen by Dr. Pablo Ledger for a follow up examination due to the patient's concern of a possible cancer recurrence. Patient was found to have possible exposed bone involving the mandibular right lingual aspect of the jaw bone. Patient was then referred for evaluation to Dental Medicine to further evaluate the patient's condition and rule out dental etiology on 7/11/11/13.  At that time, exposed bone was noted and the lower denture was adjusted as needed and patient was instructed to keep dentures out until further instructions. The patient was also prescribe chlorhexidine gluconate rinses. Patient was to rinse with 15 ML's 3 times daily. The patient was seen on 06/03/2014 for follow up reexamination of the exposed mandible. A persistent 6 x 9 mm area of exposed bone was noted involving the mandibular right lingual alveolar ridge as before. Patient was started on Trental 400 mg twice daily along with vitamin E 1000 units daily at that time and was instructed to continue chlorhexidine rinses 3 times daily. Patient was also instructed to keep the dentures out at that time. The patient was then seen on follow-up on 07/07/2014 with persistent exposed bone.  Patient was then considered for hyperbaric option therapy. Patient followed up with Dr. Nicolette Bang at [facility] and no hyperbaric oxygen therapy was recommended at that time. A bony sequestrum was finally removed by Dr. Nicolette Bang in December 2015. The patient was then seen by Dr. Pablo Ledger in early 2016 and referred to dental medicine for evaluation and assistance in reinserting the upper and lower complete dentures for use. The patient was seen on 12/03/2014 for oral examination and adjustment of dentures as indicated. The dentures were adjusted as needed with identification of the need for prosthodontics evaluation to determine the overall plan of care for the patient.  Patient was referred to Dr. Vernard Gambles at that time. Patient has not been scheduled for an appointment at this time. In the interim, the patient now presents for oral examination and reevaluation of the exposed mandible and adjustment of dentures as needed.    SUBJECTIVE: Gary [name] indicates the lower right lingual is sore at times. The upper denture" fits fine" and is not causing him any problems.   Examination:  General: The patient is a tall, slightly built male in no acute distress. Head and neck: The right neck is consistent with previous neck dissection. There is no left neck lymphadenopathy. The patient denies acute TMJ symptoms. Intraoral exam: The patient has severe xerostomia. There is no evidence of previously exposed lower right lingual mandible.The patient has a post-surgical defect in the area of the previous retromolar trigone surgery with some tethering of the lingual tissues and floor of mouth on the lower right lingual aspect. The oral hygiene in this  area is much improved with use of a Monoject syringe and salt water. Dentition: The patient is edentulous. There is atrophy of the edentulous alveolar ridges. Prosthodontics: The patient has an existing upper and lower complete dentures. The dentures appear to  be relatively stable and retentive. Ideally, they may need lab reline or replacement. No significant erythema or ulcerations are noted. Pressure indicating paste was applied to the upper and lower complete dentures. Adjustments were made as needed. Thick pressure indicating paste was applied to the mandibular denture borders and adjustments were made as needed. Dentures were again polished. Patient accepts the results. Occlusion: Occlusion of the upper and lower complete dentures were evaluated and no adjustments were needed.  ASSESSMENT: 1. 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  This is now completely covered by soft tissue. 2. The patient is edentulous 3. There is atrophy of the edentulous alveolar ridges. 4. The patient has severe xerostomia. 5. The patient has a postsurgical defect involving the lower right lingual floor of mouth and lingual vestibule. 6. Less than ideal upper and lower complete dentures with need for evaluation by a prosthodontist for the following:     A. Consideration of implant therapy and possible fabrication of a new upper and lower complete dentures.     B. Evaluation to determine the acceptability of the upper and lower complete dentures as is.     C. Evaluation for possible lab reline procedures with the prosthodontist if indicated and is agreeable to the prosthodontist.     D. Evaluation by the prosthodontist to determine if the lower denture should be even worn secondary to potential for persistent risk for osteoradionecrosis of the mandible.  PLAN/RECOMMENDATIONS: 1. I have left a message with Dr. Vernard Gambles concerning the date of anticipated consultation appointment. Patient was also given Dr. Vernard Gambles' office number to call and schedule consultation appointment. Patient is aware of the need to determine the overall ability of the patient to even wear upper and lower complete dentures at this time. The  patient expresses understanding and will wear the upper lower complete dentures cautiously and keep dentures out if sore spots arise. We discussed the continued utilization of the Trental 400 mg therapy twice a day with vitamin E 1000 units daily along with continued use of chlorhexidine rinses twice a day. Patient is to watch for possible denture irritation and recurrence of the exposed bone. The patient has been scheduled for follow-up evaluation in 2 weeks.  2. Discussion of findings with medical team and coordination of future medical and dental care as needed.   Lenn Cal, DDS

## 2014-12-16 DIAGNOSIS — L570 Actinic keratosis: Secondary | ICD-10-CM | POA: Diagnosis not present

## 2014-12-16 DIAGNOSIS — L821 Other seborrheic keratosis: Secondary | ICD-10-CM | POA: Diagnosis not present

## 2014-12-16 DIAGNOSIS — L853 Xerosis cutis: Secondary | ICD-10-CM | POA: Diagnosis not present

## 2014-12-16 DIAGNOSIS — Z85828 Personal history of other malignant neoplasm of skin: Secondary | ICD-10-CM | POA: Diagnosis not present

## 2014-12-16 DIAGNOSIS — L218 Other seborrheic dermatitis: Secondary | ICD-10-CM | POA: Diagnosis not present

## 2014-12-18 ENCOUNTER — Ambulatory Visit: Payer: Medicare Other | Admitting: Radiation Oncology

## 2014-12-18 DIAGNOSIS — H3093 Unspecified chorioretinal inflammation, bilateral: Secondary | ICD-10-CM | POA: Diagnosis not present

## 2014-12-18 DIAGNOSIS — H35353 Cystoid macular degeneration, bilateral: Secondary | ICD-10-CM | POA: Diagnosis not present

## 2014-12-18 DIAGNOSIS — H40003 Preglaucoma, unspecified, bilateral: Secondary | ICD-10-CM | POA: Diagnosis not present

## 2014-12-24 ENCOUNTER — Encounter (HOSPITAL_COMMUNITY): Payer: Self-pay | Admitting: Dentistry

## 2014-12-26 ENCOUNTER — Ambulatory Visit
Admission: RE | Admit: 2014-12-26 | Discharge: 2014-12-26 | Disposition: A | Discharge status: Home / Self Care | Payer: Medicare Other | Source: Ambulatory Visit | Attending: Radiation Oncology | Admitting: Radiation Oncology

## 2014-12-26 ENCOUNTER — Other Ambulatory Visit: Payer: Self-pay | Admitting: Radiation Oncology

## 2014-12-26 VITALS — BP 122/70 | HR 51 | Temp 98.3°F | Resp 12 | Ht 72.0 in | Wt 141.7 lb

## 2014-12-26 DIAGNOSIS — C062 Malignant neoplasm of retromolar area: Secondary | ICD-10-CM

## 2014-12-26 NOTE — Progress Notes (Signed)
Gary [name] here for follow up.  He reports trouble sleeping.  He is taking melatonin.

## 2014-12-29 ENCOUNTER — Telehealth: Payer: Self-pay | Admitting: *Deleted

## 2014-12-29 NOTE — Telephone Encounter (Signed)
Called patient to inform of appt. With Dr. Paulita Fujita on 01-19-15- arrival time - 1 pm , Dr. Erlinda Hong phone number is [phone], lvm for a return call

## 2014-12-29 NOTE — Progress Notes (Signed)
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  He stopped the chlorahexadine rinses and trental/Vitamin E. The area on his mandible has healed and he has been referred to a specialist for denture evaluation. She is wearing his top pair without an issue. He has problems with insomnia which is a chronic problem for him. His appetite is good. He is remaining active and engaged with chores around the house and by using the computer.   Allergies:  Allergies  Allergen Reactions  . Cellcept (Mycophenolate Mofetil) Other (See Comments)    Reaction unknown  . Colcrys (Colchicine) Other (See Comments)    myalgias  . Erythromycin Other (See Comments)    Reaction unknown  . Methotrexate Derivatives Hives, Itching and Rash    Only in tablet form  . Neosporin (Neomycin-Bacitracin Zn-Polymyx) Rash  . Ultram (Tramadol) Rash    Medications:  Current Outpatient Prescriptions  Medication Sig Dispense Refill  . acyclovir (ZOVIRAX) 400 MG tablet Take 400 mg by mouth 2 (two) times daily.       [name] albuterol-ipratropium (COMBIVENT) 18-103 MCG/ACT inhaler Inhale 2 puffs into the lungs every 6 (six) hours as needed for wheezing.  1 Inhaler  2  . ALPRAZolam (XANAX) 0.5 MG tablet Take 0.5 mg by mouth 3 (three) times daily as needed for sleep.       [name] Alum & Mag Hydroxide-Simeth (MAGIC MOUTHWASH W/LIDOCAINE) SOLN Swish and spit 10 mLs 3 (three) times daily.      [name] aspirin EC 81 MG tablet Take 81 mg by mouth at bedtime.       . bisoprolol (ZEBETA) 5 MG tablet Take 2 tablets (10 mg total) by mouth daily.      . cetirizine  (ZYRTEC) 10 MG tablet Take 10 mg by mouth daily.      . Cholecalciferol (VITAMIN D) 1000 UNITS capsule Take 1,000 Units by mouth daily.      [name] desonide (DESOWEN) 0.05 % cream Apply 1 application topically 2 (two) times daily. Applied to face and behind ears.      . Dexamethasone 0.7 MG IMPL Place 1 each into both eyes every 6 (six) months.      . diazepam (VALIUM) 5 MG tablet Take 1 tablet (5 mg total) by mouth every 12 (twelve) hours as needed for anxiety. For anxiety  30 tablet  0  . dorzolamide-timolol (COSOPT) 22.3-6.8 MG/ML ophthalmic solution Place 1 drop into both eyes 2 (two) times daily.      [name] emollient (BIAFINE) cream Apply 1 application topically 2 (two) times daily. Apply to right side neck after rad tx and bedtime,/prn, except not 4 hours prior to rad txs, apply daily      . feeding supplement (ENSURE COMPLETE) LIQD Take 237 mLs by mouth 3 (three) times daily between meals.      . ferrous sulfate 325 (65 FE) MG tablet Take 1 tablet (325 mg total) by mouth 3 (three) times daily after meals.      [name] levothyroxine (SYNTHROID, LEVOTHROID) 200 MCG tablet Take 200 mcg by mouth daily.      [name]  meclizine (ANTIVERT) 25 MG tablet Take 25 mg by mouth every 12 (twelve) hours as needed for dizziness.       . Multiple Vitamin (MULTIVITAMIN WITH MINERALS) TABS Take 1 tablet by mouth daily.      [name] oxyCODONE (ROXICODONE) 15 MG immediate release tablet Take 15 mg by mouth every 4 (four) hours as needed for pain.      . pantoprazole (PROTONIX) 40 MG tablet Take 40 mg by mouth daily.      . polyethylene glycol (MIRALAX / GLYCOLAX) packet Take 17 g by mouth daily.      . predniSONE (DELTASONE) 10 MG tablet Prednisone 40 mg po daily x 1 day then Prednisone 30 mg po daily x 1 day then Prednisone 20 mg po daily x 1 day then Prednisone 10 mg daily x 1 day then  Continue taking your usual dose of Prednisone 5 mg po daily as prescribed.  10 tablet  0  . predniSONE (DELTASONE) 5 MG tablet Take 5 mg by mouth  daily.      . Rivaroxaban (XARELTO) 20 MG TABS Take 1 tablet (20 mg total) by mouth daily.  30 tablet  2  . sertraline (ZOLOFT) 100 MG tablet Take 100 mg by mouth 2 (two) times daily.      . simvastatin (ZOCOR) 40 MG tablet Take 40 mg by mouth every evening.      . Zinc 30 MG CAPS Take 1 capsule by mouth daily.      [name] zolpidem (AMBIEN) 10 MG tablet Take 5-10 mg by mouth at bedtime as needed. For sleep       No current facility-administered medications for this encounter.    Physical Exam:  Filed Vitals:   12/26/14 1631  BP: 122/70  Pulse: 51  Temp: 98.3 F (36.8 C)  Resp: 12   Filed Weights   12/26/14 1631  Weight: 141 lb 11.2 oz (64.275 kg)   He is a pleasant male in no distress sitting comfortably examining table. He looks healthy and well. He has no palpable cervical or suprapubic or adenopathy. The area of exposed bone in the medial aspect of the right mandible is no longer evident.. This is not tender to palpation. No other evidence of complications or recurrent disease.   IMPRESSION: Gary [name] is a 74 y.o. 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  He is seeing Dr. Nicolette Bang at the end of the month. I will see him in June.    Thea Silversmith, MD

## 2015-01-19 ENCOUNTER — Other Ambulatory Visit: Payer: Self-pay | Admitting: Gastroenterology

## 2015-01-19 DIAGNOSIS — R1310 Dysphagia, unspecified: Secondary | ICD-10-CM

## 2015-01-19 DIAGNOSIS — Z86010 Personal history of colonic polyps: Secondary | ICD-10-CM | POA: Diagnosis not present

## 2015-01-26 ENCOUNTER — Ambulatory Visit
Admission: RE | Admit: 2015-01-26 | Discharge: 2015-01-26 | Disposition: A | Discharge status: Home / Self Care | Payer: Medicare Other | Source: Ambulatory Visit | Attending: Gastroenterology | Admitting: Gastroenterology

## 2015-01-26 DIAGNOSIS — R1310 Dysphagia, unspecified: Secondary | ICD-10-CM

## 2015-01-26 DIAGNOSIS — K225 Diverticulum of esophagus, acquired: Secondary | ICD-10-CM | POA: Diagnosis not present

## 2015-01-26 DIAGNOSIS — H903 Sensorineural hearing loss, bilateral: Secondary | ICD-10-CM | POA: Diagnosis not present

## 2015-02-16 DIAGNOSIS — H40013 Open angle with borderline findings, low risk, bilateral: Secondary | ICD-10-CM | POA: Diagnosis not present

## 2015-02-16 DIAGNOSIS — H3093 Unspecified chorioretinal inflammation, bilateral: Secondary | ICD-10-CM | POA: Diagnosis not present

## 2015-02-17 DIAGNOSIS — M546 Pain in thoracic spine: Secondary | ICD-10-CM | POA: Diagnosis not present

## 2015-02-17 DIAGNOSIS — Z79891 Long term (current) use of opiate analgesic: Secondary | ICD-10-CM | POA: Diagnosis not present

## 2015-02-17 DIAGNOSIS — G894 Chronic pain syndrome: Secondary | ICD-10-CM | POA: Diagnosis not present

## 2015-02-17 DIAGNOSIS — M5134 Other intervertebral disc degeneration, thoracic region: Secondary | ICD-10-CM | POA: Diagnosis not present

## 2015-02-19 ENCOUNTER — Other Ambulatory Visit: Payer: Self-pay | Admitting: Gastroenterology

## 2015-02-19 DIAGNOSIS — D126 Benign neoplasm of colon, unspecified: Secondary | ICD-10-CM | POA: Diagnosis not present

## 2015-02-19 DIAGNOSIS — Z86010 Personal history of colonic polyps: Secondary | ICD-10-CM | POA: Diagnosis not present

## 2015-02-19 DIAGNOSIS — D123 Benign neoplasm of transverse colon: Secondary | ICD-10-CM | POA: Diagnosis not present

## 2015-02-19 DIAGNOSIS — Z1211 Encounter for screening for malignant neoplasm of colon: Secondary | ICD-10-CM | POA: Diagnosis not present

## 2015-02-19 DIAGNOSIS — K5730 Diverticulosis of large intestine without perforation or abscess without bleeding: Secondary | ICD-10-CM | POA: Diagnosis not present

## 2015-02-19 DIAGNOSIS — Z09 Encounter for follow-up examination after completed treatment for conditions other than malignant neoplasm: Secondary | ICD-10-CM | POA: Diagnosis not present

## 2015-02-24 DIAGNOSIS — R252 Cramp and spasm: Secondary | ICD-10-CM | POA: Diagnosis not present

## 2015-02-24 DIAGNOSIS — Z923 Personal history of irradiation: Secondary | ICD-10-CM | POA: Diagnosis not present

## 2015-02-24 DIAGNOSIS — Z8589 Personal history of malignant neoplasm of other organs and systems: Secondary | ICD-10-CM | POA: Diagnosis not present

## 2015-02-24 DIAGNOSIS — K225 Diverticulum of esophagus, acquired: Secondary | ICD-10-CM | POA: Diagnosis not present

## 2015-03-12 DIAGNOSIS — F5104 Psychophysiologic insomnia: Secondary | ICD-10-CM | POA: Diagnosis not present

## 2015-03-12 DIAGNOSIS — F339 Major depressive disorder, recurrent, unspecified: Secondary | ICD-10-CM | POA: Diagnosis not present

## 2015-03-17 DIAGNOSIS — Z483 Aftercare following surgery for neoplasm: Secondary | ICD-10-CM | POA: Diagnosis not present

## 2015-03-17 DIAGNOSIS — C062 Malignant neoplasm of retromolar area: Secondary | ICD-10-CM | POA: Diagnosis not present

## 2015-03-17 DIAGNOSIS — Z87891 Personal history of nicotine dependence: Secondary | ICD-10-CM | POA: Diagnosis not present

## 2015-03-17 DIAGNOSIS — Z85818 Personal history of malignant neoplasm of other sites of lip, oral cavity, and pharynx: Secondary | ICD-10-CM | POA: Diagnosis not present

## 2015-03-18 DIAGNOSIS — Z79891 Long term (current) use of opiate analgesic: Secondary | ICD-10-CM | POA: Diagnosis not present

## 2015-03-18 DIAGNOSIS — G894 Chronic pain syndrome: Secondary | ICD-10-CM | POA: Diagnosis not present

## 2015-04-02 DIAGNOSIS — H3093 Unspecified chorioretinal inflammation, bilateral: Secondary | ICD-10-CM | POA: Diagnosis not present

## 2015-04-02 DIAGNOSIS — H35353 Cystoid macular degeneration, bilateral: Secondary | ICD-10-CM | POA: Diagnosis not present

## 2015-04-02 DIAGNOSIS — H40003 Preglaucoma, unspecified, bilateral: Secondary | ICD-10-CM | POA: Diagnosis not present

## 2015-04-07 ENCOUNTER — Other Ambulatory Visit (HOSPITAL_COMMUNITY): Payer: Self-pay | Admitting: Internal Medicine

## 2015-04-07 DIAGNOSIS — M353 Polymyalgia rheumatica: Secondary | ICD-10-CM | POA: Diagnosis not present

## 2015-04-07 DIAGNOSIS — C029 Malignant neoplasm of tongue, unspecified: Secondary | ICD-10-CM

## 2015-04-07 DIAGNOSIS — R634 Abnormal weight loss: Secondary | ICD-10-CM

## 2015-04-07 DIAGNOSIS — M898X9 Other specified disorders of bone, unspecified site: Secondary | ICD-10-CM

## 2015-04-10 ENCOUNTER — Encounter (HOSPITAL_COMMUNITY)
Admission: RE | Admit: 2015-04-10 | Discharge: 2015-04-10 | Disposition: A | Discharge status: Home / Self Care | Payer: Medicare Other | Source: Ambulatory Visit | Attending: Internal Medicine | Admitting: Internal Medicine

## 2015-04-10 DIAGNOSIS — R634 Abnormal weight loss: Secondary | ICD-10-CM | POA: Insufficient documentation

## 2015-04-10 DIAGNOSIS — M899 Disorder of bone, unspecified: Secondary | ICD-10-CM | POA: Insufficient documentation

## 2015-04-10 DIAGNOSIS — M898X9 Other specified disorders of bone, unspecified site: Secondary | ICD-10-CM

## 2015-04-10 DIAGNOSIS — C029 Malignant neoplasm of tongue, unspecified: Secondary | ICD-10-CM | POA: Insufficient documentation

## 2015-04-10 DIAGNOSIS — C760 Malignant neoplasm of head, face and neck: Secondary | ICD-10-CM | POA: Diagnosis not present

## 2015-04-10 MED ORDER — TECHNETIUM TC 99M MEDRONATE IV KIT
26.2000 | PACK | Freq: Once | INTRAVENOUS | Status: AC | PRN
  Administered 2015-04-10: 26.2 via INTRAVENOUS

## 2015-04-13 DIAGNOSIS — R634 Abnormal weight loss: Secondary | ICD-10-CM | POA: Diagnosis not present

## 2015-04-15 DIAGNOSIS — M5134 Other intervertebral disc degeneration, thoracic region: Secondary | ICD-10-CM | POA: Diagnosis not present

## 2015-04-15 DIAGNOSIS — G894 Chronic pain syndrome: Secondary | ICD-10-CM | POA: Diagnosis not present

## 2015-04-15 DIAGNOSIS — Z79891 Long term (current) use of opiate analgesic: Secondary | ICD-10-CM | POA: Diagnosis not present

## 2015-04-16 ENCOUNTER — Ambulatory Visit
Admission: RE | Admit: 2015-04-16 | Discharge: 2015-04-16 | Disposition: A | Discharge status: Home / Self Care | Payer: Medicare Other | Source: Ambulatory Visit | Attending: Radiation Oncology | Admitting: Radiation Oncology

## 2015-04-16 VITALS — BP 117/66 | HR 51 | Temp 98.6°F | Wt 136.0 lb

## 2015-04-16 DIAGNOSIS — C062 Malignant neoplasm of retromolar area: Secondary | ICD-10-CM | POA: Diagnosis not present

## 2015-04-16 NOTE — Progress Notes (Signed)
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  He has been eating well he says and has a good appetite. He has stable xerostomia and no mouth sores. He is wearing his bottom dentures. He has tried boost but was very constipated. Dr. Shelia Media has started him on Remeron. He had a bone scan last week which showed an area of concern in the left neck. CT of the neck was recommended. Dr. Shelia Media also asked him to come back for another chest xray to investigate some haziness they saw there. He saw Dr. Nicolette Bang a few months ago. He still sees Dr. Nelva Bush for pain management and is on stable doses of oxycodone for his chronic back pain. He denies cough or hemoptysis.   Allergies:  Allergies  Allergen Reactions  . Cellcept (Mycophenolate Mofetil) Other (See Comments)    Reaction unknown  . Colcrys (Colchicine) Other (See Comments)    myalgias  . Erythromycin Other (See Comments)    Reaction unknown  . Methotrexate Derivatives Hives, Itching and Rash    Only in tablet form  . Neosporin (Neomycin-Bacitracin Zn-Polymyx) Rash  . Ultram (Tramadol) Rash    Medications:  Current Outpatient Prescriptions  Medication Sig Dispense Refill  . acyclovir (ZOVIRAX) 400 MG tablet Take 400 mg by mouth 2 (two) times daily.       [name] albuterol-ipratropium (COMBIVENT) 18-103 MCG/ACT inhaler Inhale 2 puffs into the lungs every 6 (six) hours as needed for wheezing.  1 Inhaler  2  . ALPRAZolam (XANAX) 0.5 MG tablet Take 0.5 mg by mouth 3 (three) times daily as needed for sleep.       [name] Alum & Mag Hydroxide-Simeth (MAGIC MOUTHWASH W/LIDOCAINE) SOLN Swish  and spit 10 mLs 3 (three) times daily.      [name] aspirin EC 81 MG tablet Take 81 mg by mouth at bedtime.       . bisoprolol (ZEBETA) 5 MG tablet Take 2 tablets (10 mg total) by mouth daily.      . cetirizine (ZYRTEC) 10 MG tablet Take 10 mg by mouth daily.      . Cholecalciferol (VITAMIN D) 1000 UNITS capsule Take 1,000 Units by mouth daily.      [name] desonide (DESOWEN) 0.05 % cream Apply 1 application topically 2 (two) times daily. Applied to face and behind ears.      . Dexamethasone 0.7 MG IMPL Place 1 each into both eyes every 6 (six) months.      . diazepam (VALIUM) 5 MG tablet Take 1 tablet (5 mg total) by mouth every 12 (twelve) hours as needed for anxiety. For anxiety  30 tablet  0  . dorzolamide-timolol (COSOPT) 22.3-6.8 MG/ML ophthalmic solution Place 1 drop into both eyes 2 (two) times daily.      [name] emollient (BIAFINE) cream Apply 1 application topically 2 (two) times daily. Apply to right side neck after rad tx and bedtime,/prn, except not 4 hours prior to rad txs, apply daily      . feeding supplement (ENSURE COMPLETE) LIQD Take 237 mLs by mouth 3 (three) times daily between meals.      [name]  ferrous sulfate 325 (65 FE) MG tablet Take 1 tablet (325 mg total) by mouth 3 (three) times daily after meals.      [name] levothyroxine (SYNTHROID, LEVOTHROID) 200 MCG tablet Take 200 mcg by mouth daily.      . meclizine (ANTIVERT) 25 MG tablet Take 25 mg by mouth every 12 (twelve) hours as needed for dizziness.       . Multiple Vitamin (MULTIVITAMIN WITH MINERALS) TABS Take 1 tablet by mouth daily.      [name] oxyCODONE (ROXICODONE) 15 MG immediate release tablet Take 15 mg by mouth every 4 (four) hours as needed for pain.      . pantoprazole (PROTONIX) 40 MG tablet Take 40 mg by mouth daily.      . polyethylene glycol (MIRALAX / GLYCOLAX) packet Take 17 g by mouth daily.      . predniSONE (DELTASONE) 10 MG tablet Prednisone 40 mg po daily x 1 day then Prednisone 30 mg po daily x 1 day then Prednisone 20 mg po  daily x 1 day then Prednisone 10 mg daily x 1 day then  Continue taking your usual dose of Prednisone 5 mg po daily as prescribed.  10 tablet  0  . predniSONE (DELTASONE) 5 MG tablet Take 5 mg by mouth daily.      . Rivaroxaban (XARELTO) 20 MG TABS Take 1 tablet (20 mg total) by mouth daily.  30 tablet  2  . sertraline (ZOLOFT) 100 MG tablet Take 100 mg by mouth 2 (two) times daily.      . simvastatin (ZOCOR) 40 MG tablet Take 40 mg by mouth every evening.      . Zinc 30 MG CAPS Take 1 capsule by mouth daily.      [name] zolpidem (AMBIEN) 10 MG tablet Take 5-10 mg by mouth at bedtime as needed. For sleep       No current facility-administered medications for this encounter.    Physical Exam:  Filed Vitals:   04/16/15 1631  BP: 117/66  Pulse: 51  Temp: 98.6 F (37 C)   Pleasant male. No distress. Dentures in place and fit well. No palpable cervical, supraclavicular submandibular adenopathy. Mucosa is hypopigmented on the right with no visible or palpable sings of tumor recurrences.  Right tonsilar fossa and glossotonsillar fold are without visible or palpable lesions.   IMPRESSION: Elam is a 74 y.o. 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  I will get that set up for next week. He had labs at Dr. Pennie Banter recently which we will obtain.  I will see him in February barring any abnormalities on this scan.    This document serves as a record of services personally performed by Thea Silversmith , MD. It was created on her behalf by Lenn Cal, a trained medical scribe. The creation of this record is based on the scribe's personal observations and the provider's statements to them. This document has been checked and approved by the attending provider.    Thea Silversmith,  MD

## 2015-04-16 NOTE — Progress Notes (Signed)
Wt Readings from Last 3 Encounters:  04/16/15 136 lb (61.689 kg)  12/26/14 141 lb 11.2 oz (64.275 kg)  10/16/14 144 lb 11.2 oz (65.635 kg)   BP 117/66 mmHg  Pulse 51  Temp(Src) 98.6 F (37 C)  Wt 136 lb (61.689 kg)  SpO2 97%   Despite wt. Loss patient had a good appetite.No longer taking trental or vitamin e.  Back pain level 3.Continues to take oxycodone.  Bone scan 04/10/15.

## 2015-04-17 ENCOUNTER — Ambulatory Visit: Payer: Medicare Other | Admitting: Radiation Oncology

## 2015-04-17 ENCOUNTER — Telehealth: Payer: Self-pay | Admitting: Radiation Oncology

## 2015-04-17 NOTE — Telephone Encounter (Signed)
I called Gary [name] to let him know of his CT appt on Tuesday at 11:30 at [facility], but I received no answer to confirm this appointment. I've asked him to return our call.

## 2015-04-20 ENCOUNTER — Ambulatory Visit: Payer: Medicare Other

## 2015-04-21 ENCOUNTER — Other Ambulatory Visit: Payer: Self-pay | Admitting: Radiation Oncology

## 2015-04-21 ENCOUNTER — Ambulatory Visit (HOSPITAL_COMMUNITY)
Admission: RE | Admit: 2015-04-21 | Discharge: 2015-04-21 | Disposition: A | Discharge status: Home / Self Care | Payer: Medicare Other | Source: Ambulatory Visit | Attending: Radiation Oncology | Admitting: Radiation Oncology

## 2015-04-21 ENCOUNTER — Ambulatory Visit: Payer: Medicare Other

## 2015-04-21 DIAGNOSIS — C062 Malignant neoplasm of retromolar area: Secondary | ICD-10-CM

## 2015-04-21 DIAGNOSIS — Z8589 Personal history of malignant neoplasm of other organs and systems: Secondary | ICD-10-CM | POA: Diagnosis not present

## 2015-04-21 DIAGNOSIS — Z08 Encounter for follow-up examination after completed treatment for malignant neoplasm: Secondary | ICD-10-CM | POA: Diagnosis not present

## 2015-04-21 DIAGNOSIS — Z923 Personal history of irradiation: Secondary | ICD-10-CM | POA: Insufficient documentation

## 2015-04-21 DIAGNOSIS — J439 Emphysema, unspecified: Secondary | ICD-10-CM | POA: Diagnosis not present

## 2015-04-21 DIAGNOSIS — Z85818 Personal history of malignant neoplasm of other sites of lip, oral cavity, and pharynx: Secondary | ICD-10-CM | POA: Diagnosis not present

## 2015-04-21 MED ORDER — IOHEXOL 300 MG/ML  SOLN
100.0000 mL | Freq: Once | INTRAMUSCULAR | Status: AC | PRN
  Administered 2015-04-21: 60 mL via INTRAVENOUS

## 2015-05-06 DIAGNOSIS — H524 Presbyopia: Secondary | ICD-10-CM | POA: Diagnosis not present

## 2015-05-07 ENCOUNTER — Telehealth: Payer: Self-pay

## 2015-05-07 NOTE — Telephone Encounter (Signed)
Received request for CD of ct of neck to be sent to Dr.Waltonen's neck.Information given to [facility] at [facility] radiology  to send to [facility], [address] [address] Alvira Philips #[id number].

## 2015-05-25 DIAGNOSIS — F5104 Psychophysiologic insomnia: Secondary | ICD-10-CM | POA: Diagnosis not present

## 2015-06-19 DIAGNOSIS — L570 Actinic keratosis: Secondary | ICD-10-CM | POA: Diagnosis not present

## 2015-06-19 DIAGNOSIS — L821 Other seborrheic keratosis: Secondary | ICD-10-CM | POA: Diagnosis not present

## 2015-06-19 DIAGNOSIS — L218 Other seborrheic dermatitis: Secondary | ICD-10-CM | POA: Diagnosis not present

## 2015-06-19 DIAGNOSIS — Z85828 Personal history of other malignant neoplasm of skin: Secondary | ICD-10-CM | POA: Diagnosis not present

## 2015-07-28 DIAGNOSIS — H30033 Focal chorioretinal inflammation, peripheral, bilateral: Secondary | ICD-10-CM | POA: Diagnosis not present

## 2015-07-28 DIAGNOSIS — H3581 Retinal edema: Secondary | ICD-10-CM | POA: Diagnosis not present

## 2015-07-28 DIAGNOSIS — Z961 Presence of intraocular lens: Secondary | ICD-10-CM | POA: Diagnosis not present

## 2015-08-05 DIAGNOSIS — C062 Malignant neoplasm of retromolar area: Secondary | ICD-10-CM | POA: Diagnosis not present

## 2015-08-18 DIAGNOSIS — N189 Chronic kidney disease, unspecified: Secondary | ICD-10-CM | POA: Diagnosis not present

## 2015-08-18 DIAGNOSIS — D631 Anemia in chronic kidney disease: Secondary | ICD-10-CM | POA: Diagnosis not present

## 2015-08-18 DIAGNOSIS — N183 Chronic kidney disease, stage 3 (moderate): Secondary | ICD-10-CM | POA: Diagnosis not present

## 2015-08-18 DIAGNOSIS — N2581 Secondary hyperparathyroidism of renal origin: Secondary | ICD-10-CM | POA: Diagnosis not present

## 2015-08-24 DIAGNOSIS — H40013 Open angle with borderline findings, low risk, bilateral: Secondary | ICD-10-CM | POA: Diagnosis not present

## 2015-09-02 DIAGNOSIS — H40013 Open angle with borderline findings, low risk, bilateral: Secondary | ICD-10-CM | POA: Diagnosis not present

## 2015-09-02 DIAGNOSIS — H3093 Unspecified chorioretinal inflammation, bilateral: Secondary | ICD-10-CM | POA: Diagnosis not present

## 2015-09-11 DIAGNOSIS — Z79891 Long term (current) use of opiate analgesic: Secondary | ICD-10-CM | POA: Diagnosis not present

## 2015-09-11 DIAGNOSIS — M546 Pain in thoracic spine: Secondary | ICD-10-CM | POA: Diagnosis not present

## 2015-09-11 DIAGNOSIS — G894 Chronic pain syndrome: Secondary | ICD-10-CM | POA: Diagnosis not present

## 2015-09-11 DIAGNOSIS — M5134 Other intervertebral disc degeneration, thoracic region: Secondary | ICD-10-CM | POA: Diagnosis not present

## 2015-10-13 DIAGNOSIS — E039 Hypothyroidism, unspecified: Secondary | ICD-10-CM | POA: Diagnosis not present

## 2015-10-13 DIAGNOSIS — I10 Essential (primary) hypertension: Secondary | ICD-10-CM | POA: Diagnosis not present

## 2015-10-13 DIAGNOSIS — Z0000 Encounter for general adult medical examination without abnormal findings: Secondary | ICD-10-CM | POA: Diagnosis not present

## 2015-10-13 DIAGNOSIS — Z125 Encounter for screening for malignant neoplasm of prostate: Secondary | ICD-10-CM | POA: Diagnosis not present

## 2015-10-13 DIAGNOSIS — K219 Gastro-esophageal reflux disease without esophagitis: Secondary | ICD-10-CM | POA: Diagnosis not present

## 2015-10-19 DIAGNOSIS — N183 Chronic kidney disease, stage 3 (moderate): Secondary | ICD-10-CM | POA: Diagnosis not present

## 2015-10-19 DIAGNOSIS — Z1212 Encounter for screening for malignant neoplasm of rectum: Secondary | ICD-10-CM | POA: Diagnosis not present

## 2015-10-19 DIAGNOSIS — M353 Polymyalgia rheumatica: Secondary | ICD-10-CM | POA: Diagnosis not present

## 2015-10-19 DIAGNOSIS — F339 Major depressive disorder, recurrent, unspecified: Secondary | ICD-10-CM | POA: Diagnosis not present

## 2015-10-19 DIAGNOSIS — Z0001 Encounter for general adult medical examination with abnormal findings: Secondary | ICD-10-CM | POA: Diagnosis not present

## 2015-10-27 DIAGNOSIS — H30033 Focal chorioretinal inflammation, peripheral, bilateral: Secondary | ICD-10-CM | POA: Diagnosis not present

## 2015-10-27 DIAGNOSIS — H40013 Open angle with borderline findings, low risk, bilateral: Secondary | ICD-10-CM | POA: Diagnosis not present

## 2015-10-27 DIAGNOSIS — H3581 Retinal edema: Secondary | ICD-10-CM | POA: Diagnosis not present

## 2015-10-27 DIAGNOSIS — Z961 Presence of intraocular lens: Secondary | ICD-10-CM | POA: Diagnosis not present

## 2015-11-30 DIAGNOSIS — E039 Hypothyroidism, unspecified: Secondary | ICD-10-CM | POA: Diagnosis not present

## 2015-11-30 DIAGNOSIS — G4733 Obstructive sleep apnea (adult) (pediatric): Secondary | ICD-10-CM | POA: Diagnosis not present

## 2015-12-01 DIAGNOSIS — Z8551 Personal history of malignant neoplasm of bladder: Secondary | ICD-10-CM | POA: Diagnosis not present

## 2015-12-01 DIAGNOSIS — Z9889 Other specified postprocedural states: Secondary | ICD-10-CM | POA: Diagnosis not present

## 2015-12-01 DIAGNOSIS — Z08 Encounter for follow-up examination after completed treatment for malignant neoplasm: Secondary | ICD-10-CM | POA: Diagnosis not present

## 2015-12-01 DIAGNOSIS — Z87891 Personal history of nicotine dependence: Secondary | ICD-10-CM | POA: Diagnosis not present

## 2015-12-01 DIAGNOSIS — C062 Malignant neoplasm of retromolar area: Secondary | ICD-10-CM | POA: Diagnosis not present

## 2015-12-01 DIAGNOSIS — Z923 Personal history of irradiation: Secondary | ICD-10-CM | POA: Diagnosis not present

## 2015-12-02 DIAGNOSIS — M25532 Pain in left wrist: Secondary | ICD-10-CM | POA: Diagnosis not present

## 2015-12-02 DIAGNOSIS — S6992XA Unspecified injury of left wrist, hand and finger(s), initial encounter: Secondary | ICD-10-CM | POA: Diagnosis not present

## 2015-12-02 DIAGNOSIS — S63502A Unspecified sprain of left wrist, initial encounter: Secondary | ICD-10-CM | POA: Diagnosis not present

## 2015-12-25 DIAGNOSIS — L812 Freckles: Secondary | ICD-10-CM | POA: Diagnosis not present

## 2015-12-25 DIAGNOSIS — Z85828 Personal history of other malignant neoplasm of skin: Secondary | ICD-10-CM | POA: Diagnosis not present

## 2015-12-25 DIAGNOSIS — D1801 Hemangioma of skin and subcutaneous tissue: Secondary | ICD-10-CM | POA: Diagnosis not present

## 2015-12-25 DIAGNOSIS — G4733 Obstructive sleep apnea (adult) (pediatric): Secondary | ICD-10-CM | POA: Diagnosis not present

## 2015-12-25 DIAGNOSIS — L821 Other seborrheic keratosis: Secondary | ICD-10-CM | POA: Diagnosis not present

## 2015-12-25 DIAGNOSIS — L570 Actinic keratosis: Secondary | ICD-10-CM | POA: Diagnosis not present

## 2016-01-01 DIAGNOSIS — M5134 Other intervertebral disc degeneration, thoracic region: Secondary | ICD-10-CM | POA: Diagnosis not present

## 2016-01-01 DIAGNOSIS — G894 Chronic pain syndrome: Secondary | ICD-10-CM | POA: Diagnosis not present

## 2016-01-01 DIAGNOSIS — M546 Pain in thoracic spine: Secondary | ICD-10-CM | POA: Diagnosis not present

## 2016-01-01 DIAGNOSIS — Z79891 Long term (current) use of opiate analgesic: Secondary | ICD-10-CM | POA: Diagnosis not present

## 2016-01-26 DIAGNOSIS — E039 Hypothyroidism, unspecified: Secondary | ICD-10-CM | POA: Diagnosis not present

## 2016-02-05 DIAGNOSIS — G4733 Obstructive sleep apnea (adult) (pediatric): Secondary | ICD-10-CM | POA: Diagnosis not present

## 2016-03-29 DIAGNOSIS — E039 Hypothyroidism, unspecified: Secondary | ICD-10-CM | POA: Diagnosis not present

## 2016-04-25 DIAGNOSIS — H40013 Open angle with borderline findings, low risk, bilateral: Secondary | ICD-10-CM | POA: Diagnosis not present

## 2016-04-29 DIAGNOSIS — G894 Chronic pain syndrome: Secondary | ICD-10-CM | POA: Diagnosis not present

## 2016-04-29 DIAGNOSIS — Z79891 Long term (current) use of opiate analgesic: Secondary | ICD-10-CM | POA: Diagnosis not present

## 2016-04-29 DIAGNOSIS — M5134 Other intervertebral disc degeneration, thoracic region: Secondary | ICD-10-CM | POA: Diagnosis not present

## 2016-04-29 DIAGNOSIS — M546 Pain in thoracic spine: Secondary | ICD-10-CM | POA: Diagnosis not present

## 2016-05-03 DIAGNOSIS — H30033 Focal chorioretinal inflammation, peripheral, bilateral: Secondary | ICD-10-CM | POA: Diagnosis not present

## 2016-05-03 DIAGNOSIS — Z961 Presence of intraocular lens: Secondary | ICD-10-CM | POA: Diagnosis not present

## 2016-05-03 DIAGNOSIS — H3581 Retinal edema: Secondary | ICD-10-CM | POA: Diagnosis not present

## 2016-05-03 DIAGNOSIS — H40013 Open angle with borderline findings, low risk, bilateral: Secondary | ICD-10-CM | POA: Diagnosis not present

## 2016-05-17 DIAGNOSIS — E039 Hypothyroidism, unspecified: Secondary | ICD-10-CM | POA: Diagnosis not present

## 2016-05-19 DIAGNOSIS — Z85828 Personal history of other malignant neoplasm of skin: Secondary | ICD-10-CM | POA: Diagnosis not present

## 2016-05-19 DIAGNOSIS — D692 Other nonthrombocytopenic purpura: Secondary | ICD-10-CM | POA: Diagnosis not present

## 2016-05-19 DIAGNOSIS — D045 Carcinoma in situ of skin of trunk: Secondary | ICD-10-CM | POA: Diagnosis not present

## 2016-05-31 DIAGNOSIS — Z483 Aftercare following surgery for neoplasm: Secondary | ICD-10-CM | POA: Diagnosis not present

## 2016-05-31 DIAGNOSIS — Z85818 Personal history of malignant neoplasm of other sites of lip, oral cavity, and pharynx: Secondary | ICD-10-CM | POA: Diagnosis not present

## 2016-05-31 DIAGNOSIS — Z87891 Personal history of nicotine dependence: Secondary | ICD-10-CM | POA: Diagnosis not present

## 2016-05-31 DIAGNOSIS — C062 Malignant neoplasm of retromolar area: Secondary | ICD-10-CM | POA: Diagnosis not present

## 2016-06-23 DIAGNOSIS — D692 Other nonthrombocytopenic purpura: Secondary | ICD-10-CM | POA: Diagnosis not present

## 2016-06-23 DIAGNOSIS — Z85828 Personal history of other malignant neoplasm of skin: Secondary | ICD-10-CM | POA: Diagnosis not present

## 2016-06-23 DIAGNOSIS — L570 Actinic keratosis: Secondary | ICD-10-CM | POA: Diagnosis not present

## 2016-07-14 DIAGNOSIS — E039 Hypothyroidism, unspecified: Secondary | ICD-10-CM | POA: Diagnosis not present

## 2016-07-29 DIAGNOSIS — H40013 Open angle with borderline findings, low risk, bilateral: Secondary | ICD-10-CM | POA: Diagnosis not present

## 2016-08-15 DIAGNOSIS — D631 Anemia in chronic kidney disease: Secondary | ICD-10-CM | POA: Diagnosis not present

## 2016-08-15 DIAGNOSIS — N189 Chronic kidney disease, unspecified: Secondary | ICD-10-CM | POA: Diagnosis not present

## 2016-08-15 DIAGNOSIS — N2581 Secondary hyperparathyroidism of renal origin: Secondary | ICD-10-CM | POA: Diagnosis not present

## 2016-08-15 DIAGNOSIS — N183 Chronic kidney disease, stage 3 (moderate): Secondary | ICD-10-CM | POA: Diagnosis not present

## 2016-08-25 DIAGNOSIS — Z79891 Long term (current) use of opiate analgesic: Secondary | ICD-10-CM | POA: Diagnosis not present

## 2016-08-25 DIAGNOSIS — G894 Chronic pain syndrome: Secondary | ICD-10-CM | POA: Diagnosis not present

## 2016-08-31 DIAGNOSIS — H40013 Open angle with borderline findings, low risk, bilateral: Secondary | ICD-10-CM | POA: Diagnosis not present

## 2016-09-07 DIAGNOSIS — R001 Bradycardia, unspecified: Secondary | ICD-10-CM | POA: Diagnosis not present

## 2016-09-07 DIAGNOSIS — I200 Unstable angina: Secondary | ICD-10-CM | POA: Diagnosis not present

## 2016-09-08 DIAGNOSIS — I25119 Atherosclerotic heart disease of native coronary artery with unspecified angina pectoris: Secondary | ICD-10-CM | POA: Diagnosis not present

## 2016-09-08 DIAGNOSIS — G4733 Obstructive sleep apnea (adult) (pediatric): Secondary | ICD-10-CM | POA: Diagnosis not present

## 2016-09-08 DIAGNOSIS — I10 Essential (primary) hypertension: Secondary | ICD-10-CM | POA: Diagnosis not present

## 2016-09-08 DIAGNOSIS — E7800 Pure hypercholesterolemia, unspecified: Secondary | ICD-10-CM | POA: Diagnosis not present

## 2016-09-11 NOTE — H&P (Signed)
OFFICE VISIT NOTES COPIED TO EPIC FOR DOCUMENTATION  . History of Present Illness Neldon Labella AGNP-C; October 07, 2016 3:42 PM) The patient is a 75 year old male who presents with chest pain. History of CAD s/p PTCA and stenting of the LAD, last seen by cardiology in 2014 for preoperative cardiac evaluation. 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  He was seen by his PCP on 09/07/2016 for evaluation of chest pain. He states the 1st episode occurred approximately one month ago while digging a hole. Described as a substernal chest tightening that resolved with rest. He has continued to have recurrent episodes of chest pain, always with activity, although now occuring with even minimal exertion. He reported symptoms to his nephrologist who prescribed sublingual nitroglycerin, which was effective at resolving symptoms and was given nitro patch when seen by PCP yesterday. No shortness of breath, PND, orthopnea, edema, dizziness, syncope, or symptoms suggestive of TIA or claudication.   Problem List/Past Medical Bernell List; 09/07/2016 4:20 PM) Polymyalgia rheumatica (M35.3)  Psoriasis (L40.9)  Hypothyroidism (E03.9)  COPD (chronic obstructive pulmonary disease) (J44.9)  Chronic kidney disease (N18.9)  History of ASCVD (atherosclerotic cardiovascular disease) (Z86.79)  OSA (obstructive sleep apnea) (G47.33)  GERD (gastroesophageal reflux disease) (K21.9)  Hyperlipidemia, group A (E78.00)  Hypertension, essential, benign (I10)  History of hepatitis (Z86.19)   Allergies (Tiffany Thorpe; 09/07/2016 4:27 PM) Neosporin *OPHTHALMIC AGENTS*  Ultram *ANALGESICS - OPIOID*  "shuts my kidneys off" CellCept *MISCELLANEOUS THERAPEUTIC CLASSES*  Erythromycin *MACROLIDES*  Methotrexate (Arthritis) *ANALGESICS - ANTI-INFLAMMATORY*   Colcrys *GOUT AGENTS*  TraZODone HCl *ANTIDEPRESSANTS*  Rapid pulse. sight loss  Family History Franky Macho Reader; October 07, 2016 11:18 AM) Father  Deceased. at age 41 from natural causes; no heart attacks or strokes, no cardiovascular conditions Mother  Deceased. at age 96 from cancer; no heart attacks or strokes, o cardiovascular conditions Brother 1  Deceased. in his mid to late 78G from MI complications; first MI at age 56 and several MIs throughout his lifetime, several heart surgeries ( pt is not sure of the exact ones); 8 yrs younger Sister 1  In stable health. 2 years younger; no cardiovascular conditions  Social History Franky Macho Reader; 10/07/2016 11:21 AM) Number of Children  2. Marital status  Widowed. Living Situation  Lives alone. Current tobacco use  Former smoker. quit in 1982 Non Drinker/No Alcohol Use   Past Surgical History Franky Macho Reader; October 07, 2016 11:25 AM) Cataract Extraction-Bilateral 10/10/2005 Bilateral. Hip Fracture Repair 2012 Cancer Removal from Mouth 2012  Medication History (Charavina Reader; 2016-10-07 11:34 AM) Zaleplon ('10MG'$  Capsule, 1 Oral at bedtime if needed) Active. Omeprazole ('20MG'$  Capsule DR, 1 Oral twice a day) Active. Synthroid (100MCG Tablet, 1 Oral DAily) Active. Desonide (0.05% Lotion, apply to affected area External twice a day) Active. Metrogel (1% Gel, apply to affected area External as needed) Active. MiraLax (1pkt with 8oz of water Oral daily) Active. ZyrTEC Allergy ('10MG'$  Tablet, 1 Oral daily) Active. Aspirin ('81MG'$  Tablet DR, 1 Oral daily) Active. Meclizine HCl ('25MG'$  Tablet, 1 Oral daily) Active. Potassium ('99MG'$  Tablet, 1 Oral daily) Active. Glucosamine ('500MG'$  Capsule, 1 Oral daily) Active. Calcium-Vitamin D (600-'200MG'$ -UNIT Tablet, Oral) Active. ALPRAZolam (0.'5MG'$  Tablet, 1/2-1 Oral at bedtime) Active. Zolpidem Tartrate ('10MG'$  Tablet, 1 Oral at bedtime) Active. DiazePAM ('5MG'$  Tablet, 1 Oral twice a day)  Active. PredniSONE ('5MG'$  Tablet, 1 Oral daily) Active. Sertraline HCl ('100MG'$  Tablet, 1 Oral daily) Active. Ensure (  6 to8 oz Oral daily) Active. Atenolol (50MG Tablet, 1 Oral daily) Active. Simvastatin (40MG Tablet, 1 Oral daily) Active. Lovaza (1GM Capsule, 2 Oral two times daily) Active. Acyclovir (400MG Tablet, 1 Oral twice a day) Active. Nitroglycerin (0.1MG/HR Patch 24HR, 1 patch Transdermal daily) Active. Medications Reconciled (verbally with pt/ list from PCP present)  Diagnostic Studies History (Bridgette Ebony Hail, AGNP-C; 09/08/2016 3:43 PM) Worthy Keeler  09/07/2016: TSH 4.41 with creatinine 1.95, eGFR of 33, potassium 4.4, CMP otherwise normal 07/14/2016: TSH 0.415 10/13/2015: Vitamin D 31.9, CBC normal, glucose 11, creatinine 1.5, eGFR 45, potassium 5.2, CMP otherwise normal, total cholesterol 197, triglycerides 91, HDL 70, LDL 109, PSA normal Colonoscopy 11/22/2006 Coronary Angiogram 06/20/2001 1. Single-vessel coronary artery disease with critical, greater than 95% focal stenosis in the proximal left anterior descending. 2. Moderate stenosis, distal right coronary artery. 3. Normal left ventricular function, ejection fraction 70%. 4. Normal mitral and aortic valves. 5. Normal left internal mammary artery. 6. Successful primary stent placement in the proximal left anterior descending lesion. Sleep Study 2012 Treadmill stress test 2002 Nuclear stress test 01/18/2013 Low risk stress nuclear study. There is a mostly fixed inferior defect, which is slightly worse at stress, however, there is translational motion in the raw images which is likely responsible for the inferior defect. No other significant ischemia is noted. LV Wall Motion: NL LV Function; NL Wall Motion; EF 66%.  Vitals Franky Macho Reader; 09/08/2016 11:37 AM) 09/08/2016 11:08 AM Weight: 184.13 lb Height: 72in Body Surface Area: 2.06 m Body Mass Index: 24.97 kg/m  Pulse: 53 (Regular)  P.OX: 97% (Room air) BP:  148/92 (Sitting, Left Arm, Standard)  General Mental Status-Alert. General Appearance-Cooperative and Appears stated age. Build & Nutrition-Well built and Well nourished.  Head and Neck Thyroid Gland Characteristics - normal size and consistency and no palpable nodules.  Chest and Lung Exam Chest and lung exam reveals -quiet, even and easy respiratory effort with no use of accessory muscles, non-tender and on auscultation, normal breath sounds, no adventitious sounds.  Cardiovascular Cardiovascular examination reveals -normal heart sounds, regular rate and rhythm with no murmurs, carotid auscultation reveals no bruits, abdominal aorta auscultation reveals no bruits and no prominent pulsation, femoral artery auscultation bilaterally reveals normal pulses, no bruits, no thrills, normal pedal pulses bilaterally and no digital clubbing, cyanosis, edema, increased warmth or tenderness.  Abdomen Palpation/Percussion Palpation and Percussion of the abdomen reveal - Non Tender and No hepatosplenomegaly.  Neurologic Neurologic evaluation reveals -alert and oriented x 3 with no impairment of recent or remote memory. Motor-Grossly intact without any focal deficits.  Musculoskeletal Global Assessment Left Lower Extremity - no deformities, masses or tenderness, no known fractures. Right Lower Extremity - no deformities, masses or tenderness, no known fractures.    Assessment & Plan (Bridgette Ebony Hail AGNP-C; 09/08/2016 3:44 PM) Coronary artery disease involving native coronary artery of native heart with angina pectoris (I25.119) Coronary Angiogram 06/20/2001 1. Single-vessel coronary artery disease with critical, greater than 95% focal stenosis in the proximal left anterior descending. 2. Moderate stenosis, distal right coronary artery. 3. Normal left ventricular function, ejection fraction 70%. 4.Successful primary stent placement in the proximal left anterior descending  lesion. Impression: EKG 09/08/2016: Sinus bradycardia at a rate of 46 bpm, left HL enlargement, normal axis, no evidence of ischemia. Current Plans Complete electrocardiogram (93000) Hypertension, essential, benign (I10) Current Plans Started AmLODIPine Besylate 5MG, 1 (one) Tablet daily, #30, 09/08/2016, Ref. x3. Hyperlipidemia, group A (E78.00) Current Plans Discontinued Simvastatin 40MG (interaction with amlodipine). Started Atorvastatin Calcium 40MG,  1 (one) Tablet daily, #30, 09/08/2016, Ref. x3. Local Order: D/C Simvastatin OSA (obstructive sleep apnea) (G47.33) Story: Not on CPAP  Labs: 09/07/2016: TSH 4.41 with creatinine 1.95, eGFR of 33, potassium 4.4, CMP otherwise normal  07/14/2016: TSH 0.415 Current Plans Mechanism of underlying disease process and action of medications discussed with the patient. I discussed primary/secondary prevention and also dietary counseling was done. He presents for evaluation of intermittent episodes, very concerning for unstable angina. He has an established history of CAD s/p PTCA and stenting of the LAD in 2002. Amlodipine was added both for blood pressure control and symptoms of angina. I'll change simvastatin to atorvastatin to avoid drug interaction. Otherwise, he is on appropriate medical therapy. He has been pain-free since using Nitropatch. Given presentation, will schedule for cardiac catheterization, and possible angioplasty. We discussed regarding risks, benefits, alternatives to this including stress testing, CTA, and continued medical therapy. Patient wants to proceed. Understands <1-2% risk of death, stroke, MI, urgent CABG, bleeding, infection, renal failure but not limited to these. We'll bring in early for hydration for renoprotection during procedure. Follow up after cath for reevaluation and further recommendations.  *I have discussed this case with Dr. Einar Gip and he personally examined the patient and participated in formulating the  plan.*    Signed by Neldon Labella, AGNP-C (09/08/2016 3:45 PM)

## 2016-09-13 ENCOUNTER — Encounter (HOSPITAL_COMMUNITY)
Admission: RE | Disposition: A | Discharge status: Home / Self Care | Payer: Self-pay | Source: Ambulatory Visit | Attending: Cardiology

## 2016-09-13 ENCOUNTER — Encounter (HOSPITAL_COMMUNITY): Payer: Self-pay | Admitting: General Practice

## 2016-09-13 ENCOUNTER — Ambulatory Visit (HOSPITAL_COMMUNITY)
Admission: RE | Admit: 2016-09-13 | Discharge: 2016-09-14 | Disposition: A | Discharge status: Home / Self Care | Payer: Medicare Other | Source: Ambulatory Visit | Attending: Cardiology | Admitting: Cardiology

## 2016-09-13 DIAGNOSIS — I209 Angina pectoris, unspecified: Secondary | ICD-10-CM | POA: Diagnosis not present

## 2016-09-13 DIAGNOSIS — Z7982 Long term (current) use of aspirin: Secondary | ICD-10-CM | POA: Insufficient documentation

## 2016-09-13 DIAGNOSIS — L409 Psoriasis, unspecified: Secondary | ICD-10-CM | POA: Insufficient documentation

## 2016-09-13 DIAGNOSIS — I2510 Atherosclerotic heart disease of native coronary artery without angina pectoris: Secondary | ICD-10-CM | POA: Diagnosis present

## 2016-09-13 DIAGNOSIS — Z955 Presence of coronary angioplasty implant and graft: Secondary | ICD-10-CM | POA: Diagnosis not present

## 2016-09-13 DIAGNOSIS — I25110 Atherosclerotic heart disease of native coronary artery with unstable angina pectoris: Secondary | ICD-10-CM | POA: Diagnosis not present

## 2016-09-13 DIAGNOSIS — R079 Chest pain, unspecified: Secondary | ICD-10-CM | POA: Diagnosis present

## 2016-09-13 DIAGNOSIS — E785 Hyperlipidemia, unspecified: Secondary | ICD-10-CM | POA: Diagnosis not present

## 2016-09-13 DIAGNOSIS — Z8249 Family history of ischemic heart disease and other diseases of the circulatory system: Secondary | ICD-10-CM | POA: Insufficient documentation

## 2016-09-13 DIAGNOSIS — I129 Hypertensive chronic kidney disease with stage 1 through stage 4 chronic kidney disease, or unspecified chronic kidney disease: Secondary | ICD-10-CM | POA: Diagnosis not present

## 2016-09-13 DIAGNOSIS — N183 Chronic kidney disease, stage 3 (moderate): Secondary | ICD-10-CM | POA: Diagnosis not present

## 2016-09-13 DIAGNOSIS — K219 Gastro-esophageal reflux disease without esophagitis: Secondary | ICD-10-CM | POA: Diagnosis not present

## 2016-09-13 DIAGNOSIS — J449 Chronic obstructive pulmonary disease, unspecified: Secondary | ICD-10-CM | POA: Diagnosis not present

## 2016-09-13 DIAGNOSIS — M353 Polymyalgia rheumatica: Secondary | ICD-10-CM | POA: Diagnosis not present

## 2016-09-13 DIAGNOSIS — E039 Hypothyroidism, unspecified: Secondary | ICD-10-CM | POA: Diagnosis not present

## 2016-09-13 DIAGNOSIS — I4891 Unspecified atrial fibrillation: Secondary | ICD-10-CM | POA: Diagnosis not present

## 2016-09-13 DIAGNOSIS — Z7952 Long term (current) use of systemic steroids: Secondary | ICD-10-CM | POA: Insufficient documentation

## 2016-09-13 DIAGNOSIS — Z87891 Personal history of nicotine dependence: Secondary | ICD-10-CM | POA: Diagnosis not present

## 2016-09-13 DIAGNOSIS — G4733 Obstructive sleep apnea (adult) (pediatric): Secondary | ICD-10-CM | POA: Insufficient documentation

## 2016-09-13 DIAGNOSIS — Z9861 Coronary angioplasty status: Secondary | ICD-10-CM

## 2016-09-13 DIAGNOSIS — Z79899 Other long term (current) drug therapy: Secondary | ICD-10-CM | POA: Diagnosis not present

## 2016-09-13 DIAGNOSIS — I25118 Atherosclerotic heart disease of native coronary artery with other forms of angina pectoris: Secondary | ICD-10-CM | POA: Diagnosis not present

## 2016-09-13 HISTORY — DX: Rheumatoid arthritis, unspecified: M06.9

## 2016-09-13 HISTORY — DX: Angina pectoris, unspecified: I20.9

## 2016-09-13 HISTORY — PX: CORONARY ANGIOPLASTY: SHX604

## 2016-09-13 HISTORY — PX: CARDIAC CATHETERIZATION: SHX172

## 2016-09-13 HISTORY — DX: Unspecified chorioretinal inflammation, bilateral: H30.93

## 2016-09-13 HISTORY — DX: Other chorioretinal inflammations, bilateral: H30.893

## 2016-09-13 LAB — BASIC METABOLIC PANEL
ANION GAP: 10 (ref 5–15)
BUN: 21 mg/dL — ABNORMAL HIGH (ref 6–20)
CO2: 24 mmol/L (ref 22–32)
Calcium: 9.4 mg/dL (ref 8.9–10.3)
Chloride: 104 mmol/L (ref 101–111)
Creatinine, Ser: 1.82 mg/dL — ABNORMAL HIGH (ref 0.61–1.24)
GFR calc Af Amer: 40 mL/min — ABNORMAL LOW (ref >60)
GFR calc non Af Amer: 35 mL/min — ABNORMAL LOW (ref >60)
GLUCOSE: 107 mg/dL — AB (ref 65–99)
POTASSIUM: 3.9 mmol/L (ref 3.5–5.1)
Sodium: 138 mmol/L (ref 135–145)

## 2016-09-13 LAB — CBC
HEMATOCRIT: 38.4 % — AB (ref 39.0–52.0)
Hemoglobin: 12.6 g/dL — ABNORMAL LOW (ref 13.0–17.0)
MCH: 32.5 pg (ref 26.0–34.0)
MCHC: 32.8 g/dL (ref 30.0–36.0)
MCV: 99 fL (ref 78.0–100.0)
Platelets: 164 10*3/uL (ref 150–400)
RBC: 3.88 MIL/uL — AB (ref 4.22–5.81)
RDW: 14.1 % (ref 11.5–15.5)
WBC: 5.5 10*3/uL (ref 4.0–10.5)

## 2016-09-13 LAB — PROTIME-INR
INR: 1.05
Prothrombin Time: 13.7 seconds (ref 11.4–15.2)

## 2016-09-13 LAB — POCT ACTIVATED CLOTTING TIME: Activated Clotting Time: 329 seconds

## 2016-09-13 SURGERY — LEFT HEART CATH AND CORONARY ANGIOGRAPHY
Anesthesia: LOCAL

## 2016-09-13 MED ORDER — SODIUM CHLORIDE 0.9 % IV SOLN: 250.0000 mL | INTRAVENOUS | Status: DC | PRN

## 2016-09-13 MED ORDER — SODIUM CHLORIDE 0.9 % IV SOLN
250.0000 mL | INTRAVENOUS | Status: DC | PRN
  Administered 2016-09-13: 250 mL via INTRAVENOUS

## 2016-09-13 MED ORDER — RIVAROXABAN 15 MG PO TABS: 15.0000 mg | ORAL_TABLET | Freq: Every day | ORAL | Status: DC

## 2016-09-13 MED ORDER — VERAPAMIL HCL 2.5 MG/ML IV SOLN
INTRAVENOUS | Status: AC
  Filled 2016-09-13: qty 2

## 2016-09-13 MED ORDER — ACETAMINOPHEN 325 MG PO TABS: 650.0000 mg | ORAL_TABLET | ORAL | Status: DC | PRN

## 2016-09-13 MED ORDER — IOPAMIDOL (ISOVUE-370) INJECTION 76%
INTRAVENOUS | Status: DC | PRN
  Administered 2016-09-13: 50 mL via INTRA_ARTERIAL

## 2016-09-13 MED ORDER — HEPARIN (PORCINE) IN NACL 2-0.9 UNIT/ML-% IJ SOLN
INTRAMUSCULAR | Status: DC | PRN
  Administered 2016-09-13: 1500 mL

## 2016-09-13 MED ORDER — TICAGRELOR 90 MG PO TABS
ORAL_TABLET | ORAL | Status: DC | PRN
  Administered 2016-09-13: 180 mg via ORAL

## 2016-09-13 MED ORDER — SODIUM CHLORIDE 0.9% FLUSH: 3.0000 mL | INTRAVENOUS | Status: DC | PRN

## 2016-09-13 MED ORDER — HEPARIN (PORCINE) IN NACL 2-0.9 UNIT/ML-% IJ SOLN
INTRAMUSCULAR | Status: AC
  Filled 2016-09-13: qty 1500

## 2016-09-13 MED ORDER — ZOLPIDEM TARTRATE 5 MG PO TABS
5.0000 mg | ORAL_TABLET | Freq: Every evening | ORAL | Status: DC | PRN
  Administered 2016-09-13: 23:00:00 5 mg via ORAL
  Filled 2016-09-13: qty 1

## 2016-09-13 MED ORDER — POLYETHYLENE GLYCOL 3350 17 G PO PACK: 17.0000 g | PACK | Freq: Every day | ORAL | Status: DC | PRN

## 2016-09-13 MED ORDER — TICAGRELOR 90 MG PO TABS
ORAL_TABLET | ORAL | Status: AC
  Filled 2016-09-13: qty 1

## 2016-09-13 MED ORDER — MIDAZOLAM HCL 2 MG/2ML IJ SOLN
INTRAMUSCULAR | Status: DC | PRN
  Administered 2016-09-13: 1 mg via INTRAVENOUS
  Administered 2016-09-13: 2 mg via INTRAVENOUS

## 2016-09-13 MED ORDER — SODIUM CHLORIDE 0.9% FLUSH
3.0000 mL | Freq: Two times a day (BID) | INTRAVENOUS | Status: DC
  Administered 2016-09-13: 3 mL via INTRAVENOUS

## 2016-09-13 MED ORDER — PANTOPRAZOLE SODIUM 40 MG PO TBEC
40.0000 mg | DELAYED_RELEASE_TABLET | Freq: Every day | ORAL | Status: DC
  Administered 2016-09-13 – 2016-09-14 (×2): 40 mg via ORAL
  Filled 2016-09-13 (×2): qty 1

## 2016-09-13 MED ORDER — DIAZEPAM 5 MG PO TABS
5.0000 mg | ORAL_TABLET | Freq: Two times a day (BID) | ORAL | Status: DC | PRN
  Administered 2016-09-13: 23:00:00 5 mg via ORAL
  Filled 2016-09-13 (×2): qty 1

## 2016-09-13 MED ORDER — MIDAZOLAM HCL 2 MG/2ML IJ SOLN
INTRAMUSCULAR | Status: AC
  Filled 2016-09-13: qty 2

## 2016-09-13 MED ORDER — HEPARIN SODIUM (PORCINE) 1000 UNIT/ML IJ SOLN
INTRAMUSCULAR | Status: AC
  Filled 2016-09-13: qty 1

## 2016-09-13 MED ORDER — ONDANSETRON HCL 4 MG/2ML IJ SOLN: 4.0000 mg | Freq: Four times a day (QID) | INTRAMUSCULAR | Status: DC | PRN

## 2016-09-13 MED ORDER — SODIUM CHLORIDE 0.9% FLUSH: 3.0000 mL | Freq: Two times a day (BID) | INTRAVENOUS | Status: DC

## 2016-09-13 MED ORDER — OXYCODONE HCL 5 MG PO TABS
15.0000 mg | ORAL_TABLET | Freq: Two times a day (BID) | ORAL | Status: DC
  Administered 2016-09-13: 17:00:00 5 mg via ORAL
  Filled 2016-09-13: qty 3

## 2016-09-13 MED ORDER — ASPIRIN 81 MG PO CHEW
81.0000 mg | CHEWABLE_TABLET | ORAL | Status: AC
  Administered 2016-09-13: 81 mg via ORAL

## 2016-09-13 MED ORDER — ROPINIROLE HCL 1 MG PO TABS
1.0000 mg | ORAL_TABLET | Freq: Every day | ORAL | Status: DC
  Administered 2016-09-13: 1 mg via ORAL
  Filled 2016-09-13: qty 1

## 2016-09-13 MED ORDER — SODIUM CHLORIDE 0.9 % WEIGHT BASED INFUSION
3.0000 mL/kg/h | INTRAVENOUS | Status: AC
  Administered 2016-09-13: 3 mL/kg/h via INTRAVENOUS

## 2016-09-13 MED ORDER — VERAPAMIL HCL 2.5 MG/ML IV SOLN
INTRA_ARTERIAL | Status: DC | PRN
  Administered 2016-09-13: 15:00:00 via INTRA_ARTERIAL

## 2016-09-13 MED ORDER — LIDOCAINE HCL (PF) 1 % IJ SOLN
INTRAMUSCULAR | Status: DC | PRN
  Administered 2016-09-13: 2 mL

## 2016-09-13 MED ORDER — ASPIRIN 81 MG PO CHEW
CHEWABLE_TABLET | ORAL | Status: AC
  Filled 2016-09-13: qty 1

## 2016-09-13 MED ORDER — PREDNISONE 5 MG PO TABS
5.0000 mg | ORAL_TABLET | Freq: Every day | ORAL | Status: DC
  Administered 2016-09-13: 5 mg via ORAL
  Filled 2016-09-13: qty 1

## 2016-09-13 MED ORDER — LIDOCAINE HCL (PF) 1 % IJ SOLN
INTRAMUSCULAR | Status: AC
  Filled 2016-09-13: qty 30

## 2016-09-13 MED ORDER — SERTRALINE HCL 50 MG PO TABS
100.0000 mg | ORAL_TABLET | Freq: Every day | ORAL | Status: DC
Start: 2016-09-13 — End: 2016-09-14
  Administered 2016-09-13: 100 mg via ORAL
  Filled 2016-09-13: qty 2

## 2016-09-13 MED ORDER — HEPARIN SODIUM (PORCINE) 1000 UNIT/ML IJ SOLN
INTRAMUSCULAR | Status: DC | PRN
  Administered 2016-09-13: 2000 [IU] via INTRAVENOUS
  Administered 2016-09-13: 6000 [IU] via INTRAVENOUS

## 2016-09-13 MED ORDER — LORATADINE 10 MG PO TABS
10.0000 mg | ORAL_TABLET | Freq: Every day | ORAL | Status: DC
  Administered 2016-09-13 – 2016-09-14 (×2): 10 mg via ORAL
  Filled 2016-09-13 (×2): qty 1

## 2016-09-13 MED ORDER — NITROGLYCERIN 1 MG/10 ML FOR IR/CATH LAB
INTRA_ARTERIAL | Status: DC | PRN
  Administered 2016-09-13: 200 ug

## 2016-09-13 MED ORDER — IOPAMIDOL (ISOVUE-370) INJECTION 76%
INTRAVENOUS | Status: AC
  Filled 2016-09-13: qty 100

## 2016-09-13 MED ORDER — OMEGA-3-ACID ETHYL ESTERS 1 G PO CAPS
1.0000 g | ORAL_CAPSULE | Freq: Every day | ORAL | Status: DC
  Administered 2016-09-13: 1 g via ORAL
  Filled 2016-09-13: qty 1

## 2016-09-13 MED ORDER — FENTANYL CITRATE (PF) 100 MCG/2ML IJ SOLN
INTRAMUSCULAR | Status: DC | PRN
  Administered 2016-09-13 (×2): 25 ug via INTRAVENOUS

## 2016-09-13 MED ORDER — ACYCLOVIR 400 MG PO TABS
400.0000 mg | ORAL_TABLET | Freq: Every day | ORAL | Status: DC
  Administered 2016-09-13 – 2016-09-14 (×2): 400 mg via ORAL
  Filled 2016-09-13 (×2): qty 1

## 2016-09-13 MED ORDER — MAGNESIUM OXIDE 400 (241.3 MG) MG PO TABS
400.0000 mg | ORAL_TABLET | Freq: Every day | ORAL | Status: DC
  Administered 2016-09-13: 21:00:00 400 mg via ORAL
  Filled 2016-09-13: qty 1

## 2016-09-13 MED ORDER — CALCIUM CARBONATE-VITAMIN D 500-200 MG-UNIT PO TABS
2.0000 | ORAL_TABLET | Freq: Every day | ORAL | Status: DC
  Administered 2016-09-13 – 2016-09-14 (×2): 2 via ORAL
  Filled 2016-09-13 (×2): qty 2

## 2016-09-13 MED ORDER — SODIUM CHLORIDE 0.9 % WEIGHT BASED INFUSION: 1.0000 mL/kg/h | INTRAVENOUS | Status: DC

## 2016-09-13 MED ORDER — OXYCODONE HCL 5 MG PO TABS
5.0000 mg | ORAL_TABLET | Freq: Three times a day (TID) | ORAL | Status: DC | PRN
  Administered 2016-09-13: 23:00:00 15 mg via ORAL
  Filled 2016-09-13 (×2): qty 3

## 2016-09-13 MED ORDER — NITROGLYCERIN 0.1 MG/HR TD PT24
0.1000 mg | MEDICATED_PATCH | Freq: Every day | TRANSDERMAL | Status: DC
  Filled 2016-09-13 (×2): qty 1

## 2016-09-13 MED ORDER — MECLIZINE HCL 25 MG PO TABS
25.0000 mg | ORAL_TABLET | Freq: Two times a day (BID) | ORAL | Status: DC | PRN
  Filled 2016-09-13: qty 1

## 2016-09-13 MED ORDER — ATENOLOL 50 MG PO TABS
50.0000 mg | ORAL_TABLET | Freq: Every day | ORAL | Status: DC
  Administered 2016-09-13: 21:00:00 50 mg via ORAL
  Filled 2016-09-13: qty 1

## 2016-09-13 MED ORDER — LEVOTHYROXINE SODIUM 100 MCG PO TABS
100.0000 ug | ORAL_TABLET | Freq: Every day | ORAL | Status: DC
  Administered 2016-09-14: 07:00:00 100 ug via ORAL
  Filled 2016-09-13: qty 1

## 2016-09-13 MED ORDER — SODIUM CHLORIDE 0.9 % WEIGHT BASED INFUSION
3.0000 mL/kg/h | INTRAVENOUS | Status: DC
  Administered 2016-09-13: 3 mL/kg/h via INTRAVENOUS

## 2016-09-13 MED ORDER — CLOPIDOGREL BISULFATE 75 MG PO TABS
75.0000 mg | ORAL_TABLET | Freq: Every day | ORAL | Status: DC
  Administered 2016-09-14: 75 mg via ORAL
  Filled 2016-09-13: qty 1

## 2016-09-13 MED ORDER — NITROGLYCERIN 1 MG/10 ML FOR IR/CATH LAB
INTRA_ARTERIAL | Status: AC
  Filled 2016-09-13: qty 10

## 2016-09-13 MED ORDER — FENTANYL CITRATE (PF) 100 MCG/2ML IJ SOLN
INTRAMUSCULAR | Status: AC
  Filled 2016-09-13: qty 2

## 2016-09-13 MED ORDER — MIRTAZAPINE 15 MG PO TABS
15.0000 mg | ORAL_TABLET | Freq: Every day | ORAL | Status: DC
  Administered 2016-09-13: 21:00:00 15 mg via ORAL
  Filled 2016-09-13: qty 1

## 2016-09-13 SURGICAL SUPPLY — 14 items
BALLN MOZEC 2.50X25 (BALLOONS) ×2
BALLOON MOZEC 2.50X25 (BALLOONS) ×1 IMPLANT
CATH OPTITORQUE TIG 4.0 5F (CATHETERS) ×2 IMPLANT
CATH VISTA GUIDE 6FR XB3.5 (CATHETERS) ×2 IMPLANT
DEVICE RAD COMP TR BAND LRG (VASCULAR PRODUCTS) ×2 IMPLANT
GLIDESHEATH SLEND A-KIT 6F 20G (SHEATH) ×2 IMPLANT
KIT ENCORE 26 ADVANTAGE (KITS) ×2 IMPLANT
KIT HEART LEFT (KITS) ×2 IMPLANT
PACK CARDIAC CATHETERIZATION (CUSTOM PROCEDURE TRAY) ×2 IMPLANT
TRANSDUCER W/STOPCOCK (MISCELLANEOUS) ×2 IMPLANT
TUBING CIL FLEX 10 FLL-RA (TUBING) ×2 IMPLANT
WIRE COUGAR XT STRL 190CM (WIRE) ×2 IMPLANT
WIRE EMERALD 3MM-J .035X260CM (WIRE) ×2 IMPLANT
WIRE HI TORQ VERSACORE-J 145CM (WIRE) ×2 IMPLANT

## 2016-09-13 NOTE — Interval H&P Note (Signed)
History and Physical Interval Note:  09/13/2016 2:28 PM  Gary [name]  has presented today for surgery, with the diagnosis of cp, cad  The various methods of treatment have been discussed with the patient and family. After consideration of risks, benefits and other options for treatment, the patient has consented to  Procedure(s): Left Heart Cath and Coronary Angiography (N/A) and possible PCI as a surgical intervention .  The patient's history has been reviewed, patient examined, no change in status, stable for surgery.  I have reviewed the patient's chart and labs.  Questions were answered to the patient's satisfaction.   Patient was prescribed isosorbide mononitrate patch, however he did improve his symptoms but he has not been able to wear it consistently due to marked dizziness. He has limited his activities significantly due to recurrent chest discomfort suggestive of angina pectoris.  Ischemic Symptoms? CCS III (Marked limitation of ordinary activity) Anti-ischemic Medical Therapy? Maximal Medical Therapy (2 or more classes of medications) Non-invasive Test Results? No non-invasive testing performed Prior CABG? No Previous CABG   Patient Information:   1-2V CAD, no prox LAD  A (7)  Indication: 20; Score: 7   Patient Information:   1-2V-CAD with DS 50-60% With No FFR, No IVUS  I (3)  Indication: 21; Score: 3   Patient Information:   1-2V-CAD with DS 50-60% With FFR  A (7)  Indication: 22; Score: 7   Patient Information:   1-2V-CAD with DS 50-60% With FFR>0.8, IVUS not significant  I (2)  Indication: 23; Score: 2   Patient Information:   3V-CAD without LMCA With Abnormal LV systolic function  A (9)  Indication: 48; Score: 9   Patient Information:   LMCA-CAD  A (9)  Indication: 49; Score: 9   Patient Information:   2V-CAD with prox LAD PCI  A (7)  Indication: 62; Score: 7   Patient Information:   2V-CAD with prox LAD CABG  A (8)   Indication: 62; Score: 8   Patient Information:   3V-CAD without LMCA With Low CAD burden(i.e., 3 focal stenoses, low SYNTAX score) PCI  A (7)  Indication: 63; Score: 7   Patient Information:   3V-CAD without LMCA With Low CAD burden(i.e., 3 focal stenoses, low SYNTAX score) CABG  A (9)  Indication: 63; Score: 9   Patient Information:   3V-CAD without LMCA E06c - Intermediate-high CAD burden (i.e., multiple diffuse lesions, presence of CTO, or high SYNTAX score) PCI  U (4)  Indication: 64; Score: 4   Patient Information:   3V-CAD without LMCA E06c - Intermediate-high CAD burden (i.e., multiple diffuse lesions, presence of CTO, or high SYNTAX score) CABG  A (9)  Indication: 64; Score: 9   Patient Information:   LMCA-CAD With Isolated LMCA stenosis  PCI  U (6)  Indication: 65; Score: 6   Patient Information:   LMCA-CAD With Isolated LMCA stenosis  CABG  A (9)  Indication: 65; Score: 9   Patient Information:   LMCA-CAD Additional CAD, low CAD burden (i.e., 1- to 2-vessel additional involvement, low SYNTAX score) PCI  U (5)  Indication: 66; Score: 5   Patient Information:   LMCA-CAD Additional CAD, low CAD burden (i.e., 1- to 2-vessel additional involvement, low SYNTAX score) CABG  A (9)  Indication: 66; Score: 9   Patient Information:   LMCA-CAD Additional CAD, intermediate-high CAD burden (i.e., 3-vessel involvement, presence of CTO, or high SYNTAX score) PCI  I (3)  Indication: 67; Score: 3  Patient Information:   LMCA-CAD Additional CAD, intermediate-high CAD burden (i.e., 3-vessel involvement, presence of CTO, or high SYNTAX score) CABG  A (9)  Indication: 67; Score: 9  Gary [name]

## 2016-09-13 NOTE — Progress Notes (Signed)
Dr. Nadyne Coombes notified of bruising at radial site. Coban applied and concern with giving Xarelto this evening. Given instructions to hold Xarelto if any concerns with bleeding or radial site. Notified Pharmacist Forestine Na and reported to Helayne Semin[facility] RN staff for on coming shift. Orders received and modifications completed.

## 2016-09-13 NOTE — Progress Notes (Signed)
ANTICOAGULATION CONSULT NOTE - Initial Consult  Pharmacy Consult for Xarelto Indication: atrial fibrillation  Allergies  Allergen Reactions  . Bee Venom Swelling  . Cellcept [Mycophenolate Mofetil] Other (See Comments)    Reaction unknown  . Colcrys [Colchicine] Other (See Comments)    myalgias  . Erythromycin Other (See Comments)    Reaction unknown  . Methotrexate Derivatives Hives, Itching and Rash    Only in tablet form  . Neosporin [Neomycin-Bacitracin Zn-Polymyx] Rash  . Trazodone Palpitations    Weakness, loss of appetite  . Ultram [Tramadol] Rash    Patient Measurements: Height: 6' (182.9 cm) Weight: 183 lb (83 kg) IBW/kg (Calculated) : 77.6  Vital Signs: Temp: 97.4 F (36.3 C) (11/07 1537) Temp Source: Oral (11/07 1537) BP: 147/108 (11/07 1600) Pulse Rate: 78 (11/07 1600)  Labs:  Recent Labs  09/13/16 0646 09/13/16 0751  HGB  --  12.6*  HCT  --  38.4*  PLT  --  164  LABPROT 13.7  --   INR 1.05  --   CREATININE  --  1.82*    Estimated Creatinine Clearance: 38.5 mL/min (by C-G formula based on SCr of 1.82 mg/dL (H)).   Medical History: Past Medical History:  Diagnosis Date  . Anemia    "chronic anemic"  . Anxiety   . Anxiety   . Arthritis    rhematoid arthritis  . Cancer    pre-cancerous lesions removed 11/22/12 and hx of  . Cancer 10/01/12   R retromolar trigone, squamous cell  . Chronic back pain    hx of  . Chronic kidney disease    renal insufficiency, [place] kidney  . Coronary artery disease    stent placement 2000, does not see cardiologist at this time  . Dyslipidemia   . Full dentures    Edentulous since age [age]  . GERD (gastroesophageal reflux disease)   . Gout    pseudo gout  . Hearing impairment    job related, hbilat hearing aids  . Hepatitis    hepatitis "C"  . History of radiation therapy 03/11/2013-04/23/2013   right retromolar trigone/54Gy/94f  . Hypertension    sees Dr. WDeland Pretty . Hypertension   .  Hypothyroid   . Insomnia    cpap  . Sleep apnea    uses cpap machine  . Thyroid disease    Hypothyroidism   Assessment:   75yr old male s/p cardiac cath. New onset atrial fibrillation.  To begin Xarelto tonight.  Discussed with Dr. GEinar Gip~4:30pm.  Xarelto to begin ~4 hrs after TR band deflation is completed.  RN estimated TR band will be off ~~4QI if no complications.    Brilinta load given in Cath Lab, but to continue with Plavix 75 mg daily.  Got ASA 81 mg pre-cath and was on prior to admission, but not currently ordered to continue.   Renal insufficiency.  Crcl ~35-40 ml/min.  Hgb 12.6, platelet count 164.  Addendum:  RN reported some oozing/bruising with TR band deflation ~1830.  Dr. GEinar Gipwas notified, and instructions were given to hold Xarelto tonight if any concerns for bleeding at radial site.  TR band in place with visit to bedside ~2145 for Xarelto education.  No bleeding noted.  Goal of Therapy:  appropriate Xarelto dose for renal function and indication Monitor platelets by anticoagulation protocol: Yes   Plan:   Xarelto 15 mg daily was to begin at midnight tonight, but tonight's dose is now expected to be held due  to bleeding with TR band deflation earlier tonight and band still in place ~10pm.  Xarelto 15 mg daily with supper currently scheduled to begin on 11/8 pm, but could consider beginning 11/8 am, prior to discharge.  Follow up am CBC and bmet.  Xarelto education completed.      Arty Baumgartner, [place] Pager: [phone] 09/13/2016,4:59 PM  Addendum: 10:09 PM

## 2016-09-13 NOTE — Discharge Instructions (Addendum)
Information on my medicine - XARELTO (Rivaroxaban)  This medication education was reviewed with me or my healthcare representative as part of my discharge preparation.  The pharmacist that spoke with me during my hospital stay was:  Arty Baumgartner, [facility]  Why was Xarelto prescribed for you? Xarelto was prescribed for you to reduce the risk of a blood clot forming that can cause a stroke if you have a medical condition called atrial fibrillation (a type of irregular heartbeat).  What do you need to know about xarelto ? Take your Xarelto ONCE DAILY at the same time every day with your evening meal. If you have difficulty swallowing the tablet whole, you may crush it and mix in applesauce just prior to taking your dose.  Take Xarelto exactly as prescribed by your doctor and DO NOT stop taking Xarelto without talking to the doctor who prescribed the medication.  Stopping without other stroke prevention medication to take the place of Xarelto may increase your risk of developing a clot that causes a stroke.  Refill your prescription before you run out.  After discharge, you should have regular check-up appointments with your healthcare provider that is prescribing your Xarelto.  In the future your dose may need to be changed if your kidney function or weight changes by a significant amount.  What do you do if you miss a dose? If you are taking Xarelto ONCE DAILY and you miss a dose, take it as soon as you remember on the same day then continue your regularly scheduled once daily regimen the next day. Do not take two doses of Xarelto at the same time or on the same day.   Important Safety Information A possible side effect of Xarelto is bleeding. You should call your healthcare provider right away if you experience any of the following: ? Bleeding from an injury or your nose that does not stop. ? Unusual colored urine (red or dark brown) or unusual colored stools (red or  black). ? Unusual bruising for unknown reasons. ? A serious fall or if you hit your head (even if there is no bleeding).  Some medicines may interact with Xarelto and might increase your risk of bleeding while on Xarelto. To help avoid this, consult your healthcare provider or pharmacist prior to using any new prescription or non-prescription medications, including herbals, vitamins, non-steroidal anti-inflammatory drugs (NSAIDs) and supplements.  This website has more information on Xarelto: [email or web address]

## 2016-09-14 ENCOUNTER — Encounter (HOSPITAL_COMMUNITY): Payer: Self-pay | Admitting: Cardiology

## 2016-09-14 DIAGNOSIS — K219 Gastro-esophageal reflux disease without esophagitis: Secondary | ICD-10-CM | POA: Diagnosis not present

## 2016-09-14 DIAGNOSIS — Z87891 Personal history of nicotine dependence: Secondary | ICD-10-CM | POA: Diagnosis not present

## 2016-09-14 DIAGNOSIS — E785 Hyperlipidemia, unspecified: Secondary | ICD-10-CM | POA: Diagnosis not present

## 2016-09-14 DIAGNOSIS — I129 Hypertensive chronic kidney disease with stage 1 through stage 4 chronic kidney disease, or unspecified chronic kidney disease: Secondary | ICD-10-CM | POA: Diagnosis not present

## 2016-09-14 DIAGNOSIS — Z7982 Long term (current) use of aspirin: Secondary | ICD-10-CM | POA: Diagnosis not present

## 2016-09-14 DIAGNOSIS — J449 Chronic obstructive pulmonary disease, unspecified: Secondary | ICD-10-CM | POA: Diagnosis not present

## 2016-09-14 DIAGNOSIS — G4733 Obstructive sleep apnea (adult) (pediatric): Secondary | ICD-10-CM | POA: Diagnosis not present

## 2016-09-14 DIAGNOSIS — N183 Chronic kidney disease, stage 3 (moderate): Secondary | ICD-10-CM | POA: Diagnosis not present

## 2016-09-14 DIAGNOSIS — I209 Angina pectoris, unspecified: Secondary | ICD-10-CM | POA: Diagnosis not present

## 2016-09-14 DIAGNOSIS — E039 Hypothyroidism, unspecified: Secondary | ICD-10-CM | POA: Diagnosis not present

## 2016-09-14 DIAGNOSIS — Z7952 Long term (current) use of systemic steroids: Secondary | ICD-10-CM | POA: Diagnosis not present

## 2016-09-14 DIAGNOSIS — I25118 Atherosclerotic heart disease of native coronary artery with other forms of angina pectoris: Secondary | ICD-10-CM | POA: Diagnosis not present

## 2016-09-14 DIAGNOSIS — Z955 Presence of coronary angioplasty implant and graft: Secondary | ICD-10-CM | POA: Diagnosis not present

## 2016-09-14 DIAGNOSIS — I4891 Unspecified atrial fibrillation: Secondary | ICD-10-CM | POA: Diagnosis not present

## 2016-09-14 DIAGNOSIS — L409 Psoriasis, unspecified: Secondary | ICD-10-CM | POA: Diagnosis not present

## 2016-09-14 DIAGNOSIS — I25110 Atherosclerotic heart disease of native coronary artery with unstable angina pectoris: Secondary | ICD-10-CM | POA: Diagnosis not present

## 2016-09-14 DIAGNOSIS — M353 Polymyalgia rheumatica: Secondary | ICD-10-CM | POA: Diagnosis not present

## 2016-09-14 DIAGNOSIS — Z79899 Other long term (current) drug therapy: Secondary | ICD-10-CM | POA: Diagnosis not present

## 2016-09-14 DIAGNOSIS — Z8249 Family history of ischemic heart disease and other diseases of the circulatory system: Secondary | ICD-10-CM | POA: Diagnosis not present

## 2016-09-14 LAB — BASIC METABOLIC PANEL
ANION GAP: 7 (ref 5–15)
BUN: 15 mg/dL (ref 6–20)
CALCIUM: 9.3 mg/dL (ref 8.9–10.3)
CO2: 25 mmol/L (ref 22–32)
Chloride: 107 mmol/L (ref 101–111)
Creatinine, Ser: 1.63 mg/dL — ABNORMAL HIGH (ref 0.61–1.24)
GFR calc non Af Amer: 40 mL/min — ABNORMAL LOW (ref >60)
GFR, EST AFRICAN AMERICAN: 46 mL/min — AB (ref >60)
Glucose, Bld: 114 mg/dL — ABNORMAL HIGH (ref 65–99)
POTASSIUM: 3.8 mmol/L (ref 3.5–5.1)
Sodium: 139 mmol/L (ref 135–145)

## 2016-09-14 LAB — CBC
HCT: 39 % (ref 39.0–52.0)
HEMOGLOBIN: 12.8 g/dL — AB (ref 13.0–17.0)
MCH: 32.2 pg (ref 26.0–34.0)
MCHC: 32.8 g/dL (ref 30.0–36.0)
MCV: 98.2 fL (ref 78.0–100.0)
Platelets: 169 10*3/uL (ref 150–400)
RBC: 3.97 MIL/uL — AB (ref 4.22–5.81)
RDW: 14 % (ref 11.5–15.5)
WBC: 6.5 10*3/uL (ref 4.0–10.5)

## 2016-09-14 MED ORDER — RIVAROXABAN 15 MG PO TABS: 15.0000 mg | ORAL_TABLET | Freq: Every day | ORAL | 6 refills | Status: DC

## 2016-09-14 MED ORDER — ANGIOPLASTY BOOK
Freq: Once | Status: AC
  Administered 2016-09-14: 04:00:00
  Filled 2016-09-14: qty 1

## 2016-09-14 MED ORDER — CLOPIDOGREL BISULFATE 75 MG PO TABS: 75.0000 mg | ORAL_TABLET | Freq: Every day | ORAL | 1 refills | Status: DC

## 2016-09-14 MED ORDER — OFF THE BEAT BOOK
Freq: Once | Status: AC
  Administered 2016-09-14: 06:00:00
  Filled 2016-09-14: qty 1

## 2016-09-14 NOTE — Progress Notes (Signed)
.   BRILINTA 90 MG BID   COVER- YES  CO-PAY- $ 45.00  TIER - 3 DRUG  PRIOR APPROVAL - NO   2. XARELTO  15 MG BID   COVER- YES  CO-PAY- $ 45.00  TIER- 3 DRUG  PRIOR APPROVAL - YES # [phone]   PHARMACY : [facility]  MAIL- ORDER 90 DAY SUPPLY $ 125.00              31-60 DAY SUPPLY $ 85.00

## 2016-09-14 NOTE — Care Management Note (Signed)
Case Management Note  Patient Details  Name: Gary [name] MRN: [id number] Date of Birth: [birth year]  Subjective/Objective:   Patient is s/p  Heart cath, he will be on xarelto '15mg'$  daily, [facility] pharmacy has a partial fill for him and will order the rest to have by tomorrow.  NCM gave patient 30 day savings card for xarelto and co pay will be $45 for refills.  PTA indep.  Patient has transportation at dc.  He is for dc today.  MD will need to call prior auth to [phone].                Action/Plan:   Expected Discharge Date:                  Expected Discharge Plan:  Home/Self Care  In-House Referral:     Discharge planning Services  CM Consult  Post Acute Care Choice:    Choice offered to:     DME Arranged:    DME Agency:     HH Arranged:    HH Agency:     Status of Service:  Completed, signed off  If discussed at [employer] of Stay Meetings, dates discussed:    Additional Comments:  Zenon Mayo, RN 09/14/2016, 11:13 AM

## 2016-09-14 NOTE — Progress Notes (Signed)
CARDIAC REHAB PHASE I   PRE:  Rate/Rhythm:49 SB  BP:  Sitting: 157/82        SaO2: 96 RA  MODE:  Ambulation: 300 ft   POST:  Rate/Rhythm: 62 SR  BP:  Sitting: 170/82         SaO2: 96 RA  Pt ambulated 300 ft on RA, handheld assist, fairly steady gait, tolerated well with no complaints. Completed PCI education. Reviewed risk factors, anti-platelet therapy, activity restrictions, ntg, exercise, heart healthy diet, and phase 2 cardiac rehab. Pt verbalized understanding. Pt agrees to phase 2 cardiac rehab, however, pt states he is to return for a staged PCI in 2-4 weeks, will defer phase 2 cardiac rehab referral until that time. Pt to bed per pt request after walk, call bell within reach.     [phone] Lenna Sciara, RN, BSN 09/14/2016 10:59 AM

## 2016-09-14 NOTE — Discharge Summary (Signed)
Physician Discharge Summary  Patient ID: COTTON BECKLEY MRN: [id number] DOB/AGE: 12-30-1940 75 y.o.  Admit date: 09/13/2016 Discharge date: 09/14/2016  Primary Discharge Diagnosis CAD native vessel with angina pectoris  New onset A. Fibrillation with controlled ventricular response.  CHA2DS2-VASCScore: Risk Score  4,  Yearly risk of stroke  4.0. Recommendation: ASA No/Anticoagulation Yes  Secondary Discharge Diagnosis Polymyalgia rheumatica (M35.3)  Psoriasis (L40.9)  Hypothyroidism (E03.9)  COPD (chronic obstructive pulmonary disease) (J44.9)  Chronic kidney disease (N18.9)  OSA (obstructive sleep apnea) unable to tolerate CPAP. 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 in his mid to late 03T from MI complications; first MI at age 69 Y  Significant Diagnostic Studies: Coronary angiogram 09/13/2016:  1. Normal LV systolic function EF 46-56%. No wall motion abnormality. 2. Left main normal, proximal LAD stent widely patent. Just distal to the stent, at the bifurcation of D1, there is a 80-85% stenosis. Diagonal one is fairly large and the stent jailed and has a at most 40% stenosis. Mid to distal segment of the LAD has a 80-85% stenosis. 3. Moderate to large circumflex which is codominant. OM 2 has a ostial 95% stenosis and a tandem 99% stenosis. S/P balloon angioplasty with 2.5 mm balloon, ostium less than 10% and proximal 0% residual stenosis. 4. Codominant RCA, distal RCA diffuse 50% stenosis.   Hospital Course: 75 year old male who presents with chest pain. History of CAD s/p PTCA and stenting of the LAD, last seen by cardiology in 2014 for preoperative cardiac evaluation. Additionally has a history of hypertension with CKD stage III, hyperlipidemia, obstructive sleep apnea, although not able to  tolerate CPAP, COPD secondary to prior tobacco use, and polymyalgia rheumatica.  Patient's symptoms were at least class III, with frequent use of nitroglycerin and eventually had started to feel better with nitroglycerin patch but had reduced his activity significantly due to fear of recurrence of chest pain.  Given his presentation, it is felt best that he proceed directly with coronary angiography and stress testing was not for follow-up.  Due to renal insufficiency, he was admitted early in the morning and received hydration for several hours prior to angiography.  He was noted to be in A. Fib on admission to the short stay and he spontaneously converted to sinus post PTCA.  After successful angioplasty to obtuse marginal vessel with  balloon angioplasty,  it was felt safe for him to be discharged the following morning as his creatinine has remained stable with plans on performing staged intervention to the LAD which is slightly more complex.  This will be scheduled on an elective basis, patient is aware of the risks associated with this.  Recommendations on discharge: Continue Plavix and Xarelto for new onset A. Fib.   Discharge Exam: Blood pressure (!) 174/86, pulse (!) 56, temperature 98 F (36.7 C), temperature source Oral, resp. rate (!) 22, height 6' (1.829 m), weight 81.4 kg (179 lb 7.3 oz), SpO2 95 %.    General appearance: alert, cooperative, appears stated age and no distress Resp: clear to auscultation bilaterally Cardio: regular rate and rhythm, S1, S2 normal, no murmur, click, rub or gallop GI: soft, non-tender; bowel sounds normal; no masses,  no organomegaly Extremities: extremities normal, atraumatic, no cyanosis or edema Pulses: 2+ and symmetric Right radial access has healed well. Neurologic: Grossly normal    Labs:  Lab Results  Component Value Date   WBC 6.5 09/14/2016   HGB 12.8 (L) 09/14/2016   HCT 39.0 09/14/2016   MCV 98.2 09/14/2016   PLT 169 09/14/2016     Recent Labs Lab 09/14/16 0415  NA 139  K 3.8  CL 107  CO2 25  BUN 15  CREATININE 1.63*  CALCIUM 9.3  GLUCOSE 114*    Lipid Panel     Component Value Date/Time   CHOL  11/28/2010 0450    130        ATP III CLASSIFICATION:  <200     mg/dL   Desirable  200-239  mg/dL   Borderline High  >=240    mg/dL   High          TRIG 65 11/28/2010 0450   HDL 28 (L) 11/28/2010 0450   CHOLHDL 4.6 11/28/2010 0450   VLDL 13 11/28/2010 0450   LDLCALC  11/28/2010 0450    89        Total Cholesterol/HDL:CHD Risk Coronary Heart Disease Risk Table                     Men   Women  1/2 Average Risk   3.4   3.3  Average Risk       5.0   4.4  2 X Average Risk   9.6   7.1  3 X Average Risk  23.4   11.0        Use the calculated Patient Ratio above and the CHD Risk Table to determine the patient's CHD Risk.        ATP III CLASSIFICATION (LDL):  <100     mg/dL   Optimal  100-129  mg/dL   Near or Above                    Optimal  130-159  mg/dL   Borderline  160-189  mg/dL   High  >190     mg/dL   Very High    BNP (last 3 results) No results for input(s): BNP in the last 8760 hours.  HEMOGLOBIN A1C No results found for: HGBA1C, MPG  Cardiac Panel (last 3 results) No results for input(s): CKTOTAL, CKMB, TROPONINI, RELINDX in the last 8760 hours.  Lab Results  Component Value Date   TROPONINI <0.30 12/22/2012     TSH No results for input(s): TSH in the last 8760 hours.  EKG: 09/15/16: Sinus bradycardia at the rate of 52 bpm, otherwise normal EKG.  09/14/16, A. fib is no longer present.  FOLLOW UP PLANS AND APPOINTMENTS    Medication List    STOP taking these medications   aspirin EC 81 MG tablet     TAKE these medications   acyclovir 400 MG tablet Commonly known as:  ZOVIRAX Take 400 mg by mouth daily.   amLODipine 5 MG tablet Commonly known as:  NORVASC Take 5 mg by mouth daily.   atenolol 50 MG tablet Commonly known as:  TENORMIN Take 50 mg by mouth at  bedtime.   atorvastatin 40 MG tablet Commonly known as:  LIPITOR Take 40 mg by mouth daily.   Calcium-D 600-400 MG-UNIT Tabs Take 1 mg by mouth daily.   cetirizine 10 MG tablet Commonly known as:  ZYRTEC Take 10 mg by mouth at bedtime.   clopidogrel 75 MG tablet Commonly known as:  PLAVIX Take 1 tablet (75 mg total) by mouth daily with breakfast. Start taking on:  09/15/2016   diazepam 5 MG tablet Commonly known as:  VALIUM Take 1 tablet (5 mg total) by mouth every 12 (twelve) hours as needed for anxiety. For anxiety What changed:  when to take this  additional instructions   EPINEPHrine 0.3 mg/0.3 mL Soaj injection Commonly known as:  EPI-PEN Inject 0.3 mg into the muscle as directed.   Glucosamine 500 MG Caps Take 500 mg by mouth at bedtime.   levothyroxine 100 MCG tablet Commonly known as:  SYNTHROID, LEVOTHROID Take 100 mcg by mouth at bedtime.   magnesium oxide 400 MG tablet Commonly known as:  MAG-OX Take 400 mg by mouth at bedtime.   meclizine 25 MG tablet Commonly known as:  ANTIVERT Take 25 mg by mouth every 12 (twelve) hours as needed for dizziness.   mirtazapine 15 MG tablet Commonly known as:  REMERON Take 15 mg by mouth at bedtime.   nitroGLYCERIN 0.1 mg/hr patch Commonly known as:  NITRODUR - Dosed in mg/24 hr Place 0.1 mg onto the skin daily.   omega-3 acid ethyl esters 1 g capsule Commonly known as:  LOVAZA Take 1 g by mouth at bedtime.   omeprazole 20 MG capsule Commonly known as:  PRILOSEC Take 20 mg by mouth at bedtime.   oxyCODONE 15 MG immediate release tablet Commonly known as:  ROXICODONE Take 15 mg by mouth every 4 (four) hours as needed for pain.   polyethylene glycol packet Commonly known as:  MIRALAX / GLYCOLAX Take 17 g by mouth daily as needed for mild constipation.   predniSONE 5 MG tablet Commonly known as:  DELTASONE Take 5 mg by mouth at bedtime.   Rivaroxaban 15 MG Tabs tablet Commonly known as:  XARELTO Take  1 tablet (15 mg total) by mouth daily with supper.   rOPINIRole 1 MG tablet Commonly known as:  REQUIP Take 1 mg by mouth at bedtime.   sertraline 100 MG tablet Commonly known as:  ZOLOFT Take 100 mg by mouth at bedtime.   triamcinolone cream 0.1 % Commonly known as:  KENALOG Apply 1 application topically 2 (two) times daily.   zaleplon 10 MG capsule Commonly known as:  SONATA Take 10 mg by mouth at bedtime.      Follow-up Information    Adrian Prows, MD Follow up.   Specialty:  Cardiology Why:  2 weeks. You will need repeat angiogram and we will schedule it and inform you Contact information: [place]. [address] [place] [address] [phone]          Adrian Prows, MD 09/14/2016, 9:19 AM  Pager: [phone] Office: [phone] If no answer: [phone]

## 2016-09-14 NOTE — Progress Notes (Signed)
TR BAND REMOVAL  LOCATION:    right radial  DEFLATED PER PROTOCOL:    Yes.    TIME BAND OFF / DRESSING APPLIED:    2300    SITE UPON ARRIVAL:    Level 1  SITE AFTER BAND REMOVAL:    Level 1  CIRCULATION SENSATION AND MOVEMENT:    Within Normal Limits   Yes.    COMMENTS:   Coban removed; and replaced. Neurovascular intact. Pt has a large bruise surrounding puncture site. Verbalizes understanding of radial site care and demonstrates as well.

## 2016-09-21 DIAGNOSIS — I2510 Atherosclerotic heart disease of native coronary artery without angina pectoris: Secondary | ICD-10-CM | POA: Diagnosis not present

## 2016-09-21 DIAGNOSIS — I10 Essential (primary) hypertension: Secondary | ICD-10-CM | POA: Diagnosis not present

## 2016-10-05 DIAGNOSIS — I25119 Atherosclerotic heart disease of native coronary artery with unspecified angina pectoris: Secondary | ICD-10-CM | POA: Diagnosis not present

## 2016-10-11 ENCOUNTER — Encounter (HOSPITAL_COMMUNITY): Payer: Self-pay

## 2016-10-11 ENCOUNTER — Encounter (HOSPITAL_COMMUNITY)
Admission: RE | Disposition: A | Discharge status: Home / Self Care | Payer: Self-pay | Source: Ambulatory Visit | Attending: Cardiology

## 2016-10-11 ENCOUNTER — Ambulatory Visit (HOSPITAL_COMMUNITY)
Admission: RE | Admit: 2016-10-11 | Discharge: 2016-10-12 | Disposition: A | Discharge status: Home / Self Care | Payer: Medicare Other | Source: Ambulatory Visit | Attending: Cardiology | Admitting: Cardiology

## 2016-10-11 ENCOUNTER — Ambulatory Visit (HOSPITAL_COMMUNITY): Admit: 2016-10-11 | Payer: Medicare Other | Admitting: Cardiology

## 2016-10-11 DIAGNOSIS — Z87891 Personal history of nicotine dependence: Secondary | ICD-10-CM | POA: Insufficient documentation

## 2016-10-11 DIAGNOSIS — I25118 Atherosclerotic heart disease of native coronary artery with other forms of angina pectoris: Secondary | ICD-10-CM | POA: Insufficient documentation

## 2016-10-11 DIAGNOSIS — E785 Hyperlipidemia, unspecified: Secondary | ICD-10-CM | POA: Insufficient documentation

## 2016-10-11 DIAGNOSIS — Z955 Presence of coronary angioplasty implant and graft: Secondary | ICD-10-CM

## 2016-10-11 DIAGNOSIS — R079 Chest pain, unspecified: Secondary | ICD-10-CM | POA: Diagnosis present

## 2016-10-11 DIAGNOSIS — K219 Gastro-esophageal reflux disease without esophagitis: Secondary | ICD-10-CM | POA: Diagnosis not present

## 2016-10-11 DIAGNOSIS — Z7952 Long term (current) use of systemic steroids: Secondary | ICD-10-CM | POA: Diagnosis not present

## 2016-10-11 DIAGNOSIS — Z7982 Long term (current) use of aspirin: Secondary | ICD-10-CM | POA: Insufficient documentation

## 2016-10-11 DIAGNOSIS — M353 Polymyalgia rheumatica: Secondary | ICD-10-CM | POA: Insufficient documentation

## 2016-10-11 DIAGNOSIS — N183 Chronic kidney disease, stage 3 (moderate): Secondary | ICD-10-CM | POA: Diagnosis not present

## 2016-10-11 DIAGNOSIS — G4733 Obstructive sleep apnea (adult) (pediatric): Secondary | ICD-10-CM | POA: Diagnosis not present

## 2016-10-11 DIAGNOSIS — I129 Hypertensive chronic kidney disease with stage 1 through stage 4 chronic kidney disease, or unspecified chronic kidney disease: Secondary | ICD-10-CM | POA: Insufficient documentation

## 2016-10-11 DIAGNOSIS — I4891 Unspecified atrial fibrillation: Secondary | ICD-10-CM | POA: Insufficient documentation

## 2016-10-11 DIAGNOSIS — E039 Hypothyroidism, unspecified: Secondary | ICD-10-CM | POA: Diagnosis not present

## 2016-10-11 DIAGNOSIS — Z79899 Other long term (current) drug therapy: Secondary | ICD-10-CM | POA: Diagnosis not present

## 2016-10-11 DIAGNOSIS — Z9861 Coronary angioplasty status: Secondary | ICD-10-CM

## 2016-10-11 DIAGNOSIS — I2510 Atherosclerotic heart disease of native coronary artery without angina pectoris: Secondary | ICD-10-CM | POA: Diagnosis present

## 2016-10-11 DIAGNOSIS — J449 Chronic obstructive pulmonary disease, unspecified: Secondary | ICD-10-CM | POA: Insufficient documentation

## 2016-10-11 DIAGNOSIS — I209 Angina pectoris, unspecified: Secondary | ICD-10-CM | POA: Diagnosis not present

## 2016-10-11 HISTORY — PX: CARDIAC CATHETERIZATION: SHX172

## 2016-10-11 LAB — BASIC METABOLIC PANEL
ANION GAP: 7 (ref 5–15)
BUN: 12 mg/dL (ref 6–20)
CALCIUM: 9.2 mg/dL (ref 8.9–10.3)
CO2: 29 mmol/L (ref 22–32)
Chloride: 104 mmol/L (ref 101–111)
Creatinine, Ser: 1.79 mg/dL — ABNORMAL HIGH (ref 0.61–1.24)
GFR calc Af Amer: 41 mL/min — ABNORMAL LOW (ref >60)
GFR, EST NON AFRICAN AMERICAN: 35 mL/min — AB (ref >60)
GLUCOSE: 95 mg/dL (ref 65–99)
Potassium: 3.5 mmol/L (ref 3.5–5.1)
Sodium: 140 mmol/L (ref 135–145)

## 2016-10-11 LAB — POCT ACTIVATED CLOTTING TIME
ACTIVATED CLOTTING TIME: 401 s
ACTIVATED CLOTTING TIME: 290 s
ACTIVATED CLOTTING TIME: 257 s

## 2016-10-11 SURGERY — CORONARY STENT INTERVENTION

## 2016-10-11 SURGERY — CORONARY STENT INTERVENTION
Anesthesia: LOCAL

## 2016-10-11 MED ORDER — CALCIUM-D 600-400 MG-UNIT PO TABS: 1.0000 mg | ORAL_TABLET | Freq: Every day | ORAL | Status: DC

## 2016-10-11 MED ORDER — SODIUM CHLORIDE 0.9% FLUSH: 3.0000 mL | Freq: Two times a day (BID) | INTRAVENOUS | Status: DC

## 2016-10-11 MED ORDER — PREDNISONE 5 MG PO TABS
5.0000 mg | ORAL_TABLET | Freq: Every day | ORAL | Status: DC
  Administered 2016-10-11: 22:00:00 5 mg via ORAL
  Filled 2016-10-11: qty 1

## 2016-10-11 MED ORDER — MAGNESIUM OXIDE 400 (241.3 MG) MG PO TABS
400.0000 mg | ORAL_TABLET | Freq: Every day | ORAL | Status: DC
  Administered 2016-10-11: 22:00:00 400 mg via ORAL
  Filled 2016-10-11: qty 1

## 2016-10-11 MED ORDER — ACETAMINOPHEN 325 MG PO TABS: 650.0000 mg | ORAL_TABLET | ORAL | Status: DC | PRN

## 2016-10-11 MED ORDER — AMLODIPINE BESYLATE 10 MG PO TABS: 10.0000 mg | ORAL_TABLET | Freq: Once | ORAL | Status: DC

## 2016-10-11 MED ORDER — AMLODIPINE BESYLATE 5 MG PO TABS: 5.0000 mg | ORAL_TABLET | Freq: Every evening | ORAL | Status: DC

## 2016-10-11 MED ORDER — MIRTAZAPINE 15 MG PO TABS
15.0000 mg | ORAL_TABLET | Freq: Every day | ORAL | Status: DC
  Administered 2016-10-11: 15 mg via ORAL
  Filled 2016-10-11: qty 1

## 2016-10-11 MED ORDER — SODIUM CHLORIDE 0.9 % IV SOLN: 250.0000 mL | INTRAVENOUS | Status: DC | PRN

## 2016-10-11 MED ORDER — PANTOPRAZOLE SODIUM 40 MG PO TBEC
40.0000 mg | DELAYED_RELEASE_TABLET | Freq: Every day | ORAL | Status: DC
  Administered 2016-10-11 – 2016-10-12 (×2): 40 mg via ORAL
  Filled 2016-10-11 (×2): qty 1

## 2016-10-11 MED ORDER — LIDOCAINE HCL (PF) 1 % IJ SOLN
INTRAMUSCULAR | Status: DC | PRN
  Administered 2016-10-11: 2 mL via SUBCUTANEOUS

## 2016-10-11 MED ORDER — POLYETHYLENE GLYCOL 3350 17 G PO PACK: 17.0000 g | PACK | Freq: Every day | ORAL | Status: DC | PRN

## 2016-10-11 MED ORDER — HEPARIN (PORCINE) IN NACL 2-0.9 UNIT/ML-% IJ SOLN
INTRAMUSCULAR | Status: AC
  Filled 2016-10-11: qty 500

## 2016-10-11 MED ORDER — VERAPAMIL HCL 2.5 MG/ML IV SOLN
INTRA_ARTERIAL | Status: DC | PRN
  Administered 2016-10-11: 14:00:00 via INTRA_ARTERIAL

## 2016-10-11 MED ORDER — OMEGA-3-ACID ETHYL ESTERS 1 G PO CAPS
1.0000 g | ORAL_CAPSULE | Freq: Every day | ORAL | Status: DC
  Administered 2016-10-11: 1 g via ORAL
  Filled 2016-10-11: qty 1

## 2016-10-11 MED ORDER — ONDANSETRON HCL 4 MG/2ML IJ SOLN: 4.0000 mg | Freq: Four times a day (QID) | INTRAMUSCULAR | Status: DC | PRN

## 2016-10-11 MED ORDER — ATORVASTATIN CALCIUM 40 MG PO TABS
40.0000 mg | ORAL_TABLET | Freq: Every evening | ORAL | Status: DC
  Administered 2016-10-11: 40 mg via ORAL
  Filled 2016-10-11: qty 1

## 2016-10-11 MED ORDER — VERAPAMIL HCL 2.5 MG/ML IV SOLN
INTRAVENOUS | Status: AC
  Filled 2016-10-11: qty 2

## 2016-10-11 MED ORDER — IOPAMIDOL (ISOVUE-370) INJECTION 76%
INTRAVENOUS | Status: AC
  Filled 2016-10-11: qty 100

## 2016-10-11 MED ORDER — SODIUM CHLORIDE 0.9% FLUSH: 3.0000 mL | INTRAVENOUS | Status: DC | PRN

## 2016-10-11 MED ORDER — ATENOLOL 50 MG PO TABS: 50.0000 mg | ORAL_TABLET | Freq: Every day | ORAL | Status: DC

## 2016-10-11 MED ORDER — ACYCLOVIR 400 MG PO TABS
400.0000 mg | ORAL_TABLET | Freq: Every day | ORAL | Status: DC
  Administered 2016-10-11 – 2016-10-12 (×2): 400 mg via ORAL
  Filled 2016-10-11 (×3): qty 1

## 2016-10-11 MED ORDER — ASPIRIN 81 MG PO CHEW
81.0000 mg | CHEWABLE_TABLET | ORAL | Status: AC
  Administered 2016-10-11: 81 mg via ORAL

## 2016-10-11 MED ORDER — TICAGRELOR 90 MG PO TABS
ORAL_TABLET | ORAL | Status: AC
  Administered 2016-10-11: 90 mg via ORAL
  Filled 2016-10-11: qty 1

## 2016-10-11 MED ORDER — HEPARIN (PORCINE) IN NACL 2-0.9 UNIT/ML-% IJ SOLN
INTRAMUSCULAR | Status: AC
  Filled 2016-10-11: qty 1000

## 2016-10-11 MED ORDER — METOPROLOL TARTRATE 25 MG PO TABS
50.0000 mg | ORAL_TABLET | Freq: Two times a day (BID) | ORAL | Status: DC
  Administered 2016-10-11 – 2016-10-12 (×2): 50 mg via ORAL
  Filled 2016-10-11 (×2): qty 2

## 2016-10-11 MED ORDER — HYDRALAZINE HCL 20 MG/ML IJ SOLN
5.0000 mg | INTRAMUSCULAR | Status: AC | PRN
  Administered 2016-10-11: 5 mg via INTRAVENOUS
  Filled 2016-10-11: qty 1

## 2016-10-11 MED ORDER — HEPARIN (PORCINE) IN NACL 2-0.9 UNIT/ML-% IJ SOLN
INTRAMUSCULAR | Status: DC | PRN
  Administered 2016-10-11: 16:00:00

## 2016-10-11 MED ORDER — CALCIUM CARBONATE-VITAMIN D 500-200 MG-UNIT PO TABS
1.0000 | ORAL_TABLET | Freq: Every day | ORAL | Status: DC
  Administered 2016-10-12: 1 via ORAL
  Filled 2016-10-11: qty 1

## 2016-10-11 MED ORDER — SODIUM CHLORIDE 0.9 % WEIGHT BASED INFUSION
3.0000 mL/kg/h | INTRAVENOUS | Status: DC
  Administered 2016-10-11: 3 mL/kg/h via INTRAVENOUS

## 2016-10-11 MED ORDER — MAGNESIUM OXIDE 400 MG PO TABS: 400.0000 mg | ORAL_TABLET | Freq: Every day | ORAL | Status: DC

## 2016-10-11 MED ORDER — MIDAZOLAM HCL 2 MG/2ML IJ SOLN
INTRAMUSCULAR | Status: DC | PRN
  Administered 2016-10-11: 1 mg via INTRAVENOUS

## 2016-10-11 MED ORDER — SERTRALINE HCL 50 MG PO TABS
100.0000 mg | ORAL_TABLET | Freq: Every day | ORAL | Status: DC
  Administered 2016-10-11: 100 mg via ORAL
  Filled 2016-10-11: qty 2

## 2016-10-11 MED ORDER — LORATADINE 10 MG PO TABS
10.0000 mg | ORAL_TABLET | Freq: Every day | ORAL | Status: DC
  Administered 2016-10-11 – 2016-10-12 (×2): 10 mg via ORAL
  Filled 2016-10-11 (×2): qty 1

## 2016-10-11 MED ORDER — CLOPIDOGREL BISULFATE 75 MG PO TABS
75.0000 mg | ORAL_TABLET | Freq: Every day | ORAL | Status: DC
  Administered 2016-10-12: 75 mg via ORAL
  Filled 2016-10-11: qty 1

## 2016-10-11 MED ORDER — METOPROLOL TARTRATE 25 MG PO TABS: 50.0000 mg | ORAL_TABLET | Freq: Two times a day (BID) | ORAL | Status: DC

## 2016-10-11 MED ORDER — RIVAROXABAN 15 MG PO TABS
15.0000 mg | ORAL_TABLET | Freq: Every day | ORAL | Status: DC
  Administered 2016-10-11: 15 mg via ORAL
  Filled 2016-10-11 (×2): qty 1

## 2016-10-11 MED ORDER — HEPARIN SODIUM (PORCINE) 1000 UNIT/ML IJ SOLN
INTRAMUSCULAR | Status: AC
  Filled 2016-10-11: qty 1

## 2016-10-11 MED ORDER — FENTANYL CITRATE (PF) 100 MCG/2ML IJ SOLN
INTRAMUSCULAR | Status: AC
  Filled 2016-10-11: qty 2

## 2016-10-11 MED ORDER — DIAZEPAM 5 MG PO TABS: 5.0000 mg | ORAL_TABLET | Freq: Every evening | ORAL | Status: DC | PRN

## 2016-10-11 MED ORDER — FENTANYL CITRATE (PF) 100 MCG/2ML IJ SOLN
INTRAMUSCULAR | Status: DC | PRN
  Administered 2016-10-11: 25 ug via INTRAVENOUS

## 2016-10-11 MED ORDER — AMLODIPINE BESYLATE 10 MG PO TABS
10.0000 mg | ORAL_TABLET | Freq: Every evening | ORAL | Status: DC
  Filled 2016-10-11: qty 1

## 2016-10-11 MED ORDER — ASPIRIN EC 81 MG PO TBEC: 81.0000 mg | DELAYED_RELEASE_TABLET | Freq: Every day | ORAL | Status: DC

## 2016-10-11 MED ORDER — SODIUM CHLORIDE 0.9 % WEIGHT BASED INFUSION: 1.0000 mL/kg/h | INTRAVENOUS | Status: DC

## 2016-10-11 MED ORDER — NITROGLYCERIN 1 MG/10 ML FOR IR/CATH LAB
INTRA_ARTERIAL | Status: AC
  Filled 2016-10-11: qty 10

## 2016-10-11 MED ORDER — ZOLPIDEM TARTRATE 5 MG PO TABS: 5.0000 mg | ORAL_TABLET | Freq: Every evening | ORAL | Status: DC | PRN

## 2016-10-11 MED ORDER — HEPARIN SODIUM (PORCINE) 1000 UNIT/ML IJ SOLN
INTRAMUSCULAR | Status: DC | PRN
  Administered 2016-10-11: 8000 [IU] via INTRAVENOUS

## 2016-10-11 MED ORDER — LEVOTHYROXINE SODIUM 100 MCG PO TABS
100.0000 ug | ORAL_TABLET | Freq: Every day | ORAL | Status: DC
  Administered 2016-10-11: 100 ug via ORAL
  Filled 2016-10-11: qty 1

## 2016-10-11 MED ORDER — ASPIRIN 81 MG PO CHEW
CHEWABLE_TABLET | ORAL | Status: AC
  Administered 2016-10-11: 81 mg via ORAL
  Filled 2016-10-11: qty 1

## 2016-10-11 MED ORDER — ANGIOPLASTY BOOK
Freq: Once | Status: AC
  Administered 2016-10-11: 21:00:00
  Filled 2016-10-11: qty 1

## 2016-10-11 MED ORDER — TICAGRELOR 90 MG PO TABS
90.0000 mg | ORAL_TABLET | ORAL | Status: AC
  Administered 2016-10-11: 90 mg via ORAL

## 2016-10-11 MED ORDER — IOPAMIDOL (ISOVUE-370) INJECTION 76%
INTRAVENOUS | Status: DC | PRN
  Administered 2016-10-11: 220 mL

## 2016-10-11 MED ORDER — EPINEPHRINE 0.3 MG/0.3ML IJ SOAJ: 0.3000 mg | INTRAMUSCULAR | Status: DC

## 2016-10-11 MED ORDER — OXYCODONE HCL 5 MG PO TABS
15.0000 mg | ORAL_TABLET | ORAL | Status: DC | PRN
  Administered 2016-10-11: 22:00:00 15 mg via ORAL
  Filled 2016-10-11: qty 3

## 2016-10-11 MED ORDER — MIDAZOLAM HCL 2 MG/2ML IJ SOLN
INTRAMUSCULAR | Status: AC
  Filled 2016-10-11: qty 2

## 2016-10-11 MED ORDER — LIDOCAINE HCL (PF) 1 % IJ SOLN
INTRAMUSCULAR | Status: AC
  Filled 2016-10-11: qty 30

## 2016-10-11 MED ORDER — SODIUM CHLORIDE 0.9 % WEIGHT BASED INFUSION
1.0000 mL/kg/h | INTRAVENOUS | Status: AC
  Administered 2016-10-11: 1 mL/kg/h via INTRAVENOUS

## 2016-10-11 MED ORDER — NITROGLYCERIN 0.1 MG/HR TD PT24
0.1000 mg | MEDICATED_PATCH | Freq: Every day | TRANSDERMAL | Status: DC
  Filled 2016-10-11: qty 1

## 2016-10-11 MED ORDER — MECLIZINE HCL 25 MG PO TABS: 25.0000 mg | ORAL_TABLET | Freq: Two times a day (BID) | ORAL | Status: DC | PRN

## 2016-10-11 MED ORDER — LABETALOL HCL 5 MG/ML IV SOLN: 10.0000 mg | INTRAVENOUS | Status: AC | PRN

## 2016-10-11 SURGICAL SUPPLY — 29 items
BALLN EUPHORA RX 1.5X20 (BALLOONS) ×3
BALLN MOZEC 1.5X12 (BALLOONS) ×2
BALLN MOZEC 2.0X12 (BALLOONS) ×3
BALLN MOZEC 2.50X9 (BALLOONS) ×2
BALLN ~~LOC~~ MOZEC 2.75X10 (BALLOONS) ×2
BALLN ~~LOC~~ MOZEC 3.0X13 (BALLOONS) ×2
BALLN ~~LOC~~ MOZEC 3.0X8 (BALLOONS) ×2
BALLOON EUPHORA RX 1.5X20 (BALLOONS) ×1 IMPLANT
BALLOON MOZEC 1.5X12 (BALLOONS) ×1 IMPLANT
BALLOON MOZEC 2.0X12 (BALLOONS) ×1 IMPLANT
BALLOON MOZEC 2.50X9 (BALLOONS) ×1 IMPLANT
BALLOON ~~LOC~~ MOZEC 2.75X10 (BALLOONS) ×1 IMPLANT
BALLOON ~~LOC~~ MOZEC 3.0X13 (BALLOONS) ×1 IMPLANT
BALLOON ~~LOC~~ MOZEC 3.0X8 (BALLOONS) ×1 IMPLANT
CATH VISTA GUIDE 6FR XBLAD3.5 (CATHETERS) ×3 IMPLANT
DEVICE RAD COMP TR BAND LRG (VASCULAR PRODUCTS) ×3 IMPLANT
GLIDESHEATH SLEND A-KIT 6F 20G (SHEATH) ×3 IMPLANT
GUIDEWIRE INQWIRE 1.5J.035X260 (WIRE) ×1 IMPLANT
INQWIRE 1.5J .035X260CM (WIRE) ×3
KIT ENCORE 26 ADVANTAGE (KITS) ×6 IMPLANT
KIT HEART LEFT (KITS) ×3 IMPLANT
PACK CARDIAC CATHETERIZATION (CUSTOM PROCEDURE TRAY) ×3 IMPLANT
STENT RESOLUTE ONYX 2.5X12 (Permanent Stent) ×2 IMPLANT
STENT RESOLUTE ONYX 3.0X15 (Permanent Stent) ×2 IMPLANT
STENT TRYTON 2.5-3.0X19 (Permanent Stent) ×2 IMPLANT
TRANSDUCER W/STOPCOCK (MISCELLANEOUS) ×3 IMPLANT
TUBING CIL FLEX 10 FLL-RA (TUBING) ×3 IMPLANT
WIRE COUGAR XT STRL 190CM (WIRE) ×3 IMPLANT
WIRE RUNTHROUGH .014X180CM (WIRE) ×3 IMPLANT

## 2016-10-11 NOTE — Interval H&P Note (Signed)
History and Physical Interval Note:  10/11/2016 2:00 PM  Gary [name]  has presented today for surgery, with the diagnosis of cad  The various methods of treatment have been discussed with the patient and family. After consideration of risks, benefits and other options for treatment, the patient has consented to  Procedure(s): Coronary Stent Intervention (N/A) as a surgical intervention .  The patient's history has been reviewed, patient examined, no change in status, stable for surgery.  I have reviewed the patient's chart and labs.  Questions were answered to the patient's satisfaction.   Please see previous [facility] evaluation/   Cath Lab Visit (complete for each Cath Lab visit)  Clinical Evaluation Leading to the Procedure:   ACS: No.  Non-ACS:    Anginal Classification: CCS III  Anti-ischemic medical therapy: Maximal Therapy (2 or more classes of medications)  Non-Invasive Test Results: No non-invasive testing performed  Prior CABG: No previous CABG        Adrian Prows

## 2016-10-11 NOTE — Progress Notes (Signed)
TR BAND REMOVAL  LOCATION:    right radial  DEFLATED PER PROTOCOL:    Yes.    TIME BAND OFF / DRESSING APPLIED:    20:00   SITE UPON ARRIVAL:    Level 0  SITE AFTER BAND REMOVAL:    Level 0  CIRCULATION SENSATION AND MOVEMENT:    Within Normal Limits   Yes.    COMMENTS:   Post TR band instructions given. Pt tolerated well. 

## 2016-10-11 NOTE — H&P (View-Only) (Signed)
OFFICE VISIT NOTES COPIED TO EPIC FOR DOCUMENTATION  . History of Present Illness Gary [name] AGNP-C; 2016/09/21 3:42 PM) The patient is a 75 year old male who presents with chest pain. History of CAD s/p PTCA and stenting of the LAD, last seen by cardiology in 2014 for preoperative cardiac evaluation. 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  He was seen by his PCP on 09/07/2016 for evaluation of chest pain. He states the 1st episode occurred approximately one month ago while digging a hole. Described as a substernal chest tightening that resolved with rest. He has continued to have recurrent episodes of chest pain, always with activity, although now occuring with even minimal exertion. He reported symptoms to his nephrologist who prescribed sublingual nitroglycerin, which was effective at resolving symptoms and was given nitro patch when seen by PCP yesterday. No shortness of breath, PND, orthopnea, edema, dizziness, syncope, or symptoms suggestive of TIA or claudication.   Problem [name]/Past Medical Gary [name]; 09/07/2016 4:20 PM) Polymyalgia rheumatica (M35.3)  Psoriasis (L40.9)  Hypothyroidism (E03.9)  COPD (chronic obstructive pulmonary disease) (J44.9)  Chronic kidney disease (N18.9)  History of ASCVD (atherosclerotic cardiovascular disease) (Z86.79)  OSA (obstructive sleep apnea) (G47.33)  GERD (gastroesophageal reflux disease) (K21.9)  Hyperlipidemia, group A (E78.00)  Hypertension, essential, benign (I10)  History of hepatitis (Z86.19)   Allergies (Gary [name]; 09/07/2016 4:27 PM) Neosporin *OPHTHALMIC AGENTS*  Ultram *ANALGESICS - OPIOID*  "shuts my kidneys off" CellCept *MISCELLANEOUS THERAPEUTIC CLASSES*  Erythromycin *MACROLIDES*  Methotrexate (Arthritis) *ANALGESICS - ANTI-INFLAMMATORY*   Colcrys *GOUT AGENTS*  TraZODone HCl *ANTIDEPRESSANTS*  Rapid pulse. sight loss  Family History Gary [name]; 2016-09-21 11:18 AM) Father  Deceased. at age 75 from natural causes; no heart attacks or strokes, no cardiovascular conditions Mother  Deceased. at age 81 from cancer; no heart attacks or strokes, o cardiovascular conditions Brother 1  Deceased. in his mid to late 16X from MI complications; first MI at age 71 and several MIs throughout his lifetime, several heart surgeries ( pt is not sure of the exact ones); 8 yrs younger Sister 1  In stable health. 2 years younger; no cardiovascular conditions  Social History Gary [name]; 09/21/16 11:21 AM) Number of Children  2. Marital status  Widowed. Living Situation  Lives alone. Current tobacco use  Former smoker. quit in 1982 Non Drinker/No Alcohol Use   Past Surgical History Gary [name]; Sep 21, 2016 11:25 AM) Cataract Extraction-Bilateral 10/10/2005 Bilateral. 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 Omeprazole ('20MG'$  Capsule DR, 1 Oral twice a day) Active. Synthroid (100MCG Tablet, 1 Oral DAily) Active. Desonide (0.05% Lotion, apply to affected area External twice a day) Active. Metrogel (1% Gel, apply to affected area External as needed) Active. MiraLax (1pkt with 8oz of water Oral daily) Active. ZyrTEC Allergy ('10MG'$  Tablet, 1 Oral daily) Active. Aspirin ('81MG'$  Tablet DR, 1 Oral daily) Active. Meclizine HCl ('25MG'$  Tablet, 1 Oral daily) Active. Potassium ('99MG'$  Tablet, 1 Oral daily) Active. Glucosamine ('500MG'$  Capsule, 1 Oral daily) Active. Calcium-Vitamin D (600-'200MG'$ -UNIT Tablet, Oral) Active. ALPRAZolam (0.'5MG'$  Tablet, 1/2-1 Oral at bedtime) Active. Zolpidem Tartrate ('10MG'$  Tablet, 1 Oral at bedtime) Active. DiazePAM ('5MG'$  Tablet, 1 Oral twice a day)  Active. PredniSONE ('5MG'$  Tablet, 1 Oral daily) Active. Sertraline HCl ('100MG'$  Tablet, 1 Oral daily) Active. Ensure (  6 to8 oz Oral daily) Active. Atenolol (50MG Tablet, 1 Oral daily) Active. Simvastatin (40MG Tablet, 1 Oral daily) Active. Lovaza (1GM Capsule, 2 Oral two times daily) Active. Acyclovir (400MG Tablet, 1 Oral twice a day) Active. Nitroglycerin (0.1MG/HR Patch 24HR, 1 patch Transdermal daily) Active. Medications Reconciled (verbally with pt/ [name] from PCP present)  Diagnostic Studies History (Gary [name], AGNP-C; 09/08/2016 3:43 PM) [facility] Ke[facility]  09/07/2016: TSH 4.41 with creatinine 1.95, eGFR of 33, potassium 4.4, CMP otherwise normal 07/14/2016: TSH 0.415 10/13/2015: Vitamin D 31.9, CBC normal, glucose 11, creatinine 1.5, eGFR 45, potassium 5.2, CMP otherwise normal, total cholesterol 197, triglycerides 91, HDL 70, LDL 109, PSA normal Colonoscopy 11/22/2006 Coronary Angiogram 06/20/2001 1. Single-vessel coronary artery disease with critical, greater than 95% focal stenosis in the proximal left anterior descending. 2. Moderate stenosis, distal right coronary artery. 3. Normal left ventricular function, ejection fraction 70%. 4. Normal mitral and aortic valves. 5. Normal left internal mammary artery. 6. Successful primary stent placement in the proximal left anterior descending lesion. Sleep Study 2012 Treadmill stress test 2002 Nuclear stress test 01/18/2013 Low risk stress nuclear study. There is a mostly fixed inferior defect, which is slightly worse at stress, however, there is translational motion in the raw images which is likely responsible for the inferior defect. No other significant ischemia is noted. LV Wall Motion: NL LV Function; NL Wall Motion; EF 66%.  Vitals Gary [name]; 09/08/2016 11:37 AM) 09/08/2016 11:08 AM Weight: 184.13 lb Height: 72in Body Surface Area: 2.06 m Body Mass Index: 24.97 kg/m  Pulse: 53 (Regular)  P.OX: 97% (Room air) BP:  148/92 (Sitting, Left Arm, Standard)  General Mental Status-Alert. General Appearance-Cooperative and Appears stated age. Build & Nutrition-Well built and Well nourished.  Head and Neck Thyroid Gland Characteristics - normal size and consistency and no palpable nodules.  Chest and Lung Exam Chest and lung exam reveals -quiet, even and easy respiratory effort with no use of accessory muscles, non-tender and on auscultation, normal breath sounds, no adventitious sounds.  Cardiovascular Cardiovascular examination reveals -normal heart sounds, regular rate and rhythm with no murmurs, carotid auscultation reveals no bruits, abdominal aorta auscultation reveals no bruits and no prominent pulsation, femoral artery auscultation bilaterally reveals normal pulses, no bruits, no thrills, normal pedal pulses bilaterally and no digital clubbing, cyanosis, edema, increased warmth or tenderness.  Abdomen Palpation/Percussion Palpation and Percussion of the abdomen reveal - Non Tender and No hepatosplenomegaly.  Neurologic Neurologic evaluation reveals -alert and oriented x 3 with no impairment of recent or remote memory. Motor-Grossly intact without any focal deficits.  Musculoskeletal Global Assessment Left Lower Extremity - no deformities, masses or tenderness, no known fractures. Right Lower Extremity - no deformities, masses or tenderness, no known fractures.    Assessment & Plan (Gary [name] AGNP-C; 09/08/2016 3:44 PM) Coronary artery disease involving native coronary artery of native heart with angina pectoris (I25.119) Coronary Angiogram 06/20/2001 1. Single-vessel coronary artery disease with critical, greater than 95% focal stenosis in the proximal left anterior descending. 2. Moderate stenosis, distal right coronary artery. 3. Normal left ventricular function, ejection fraction 70%. 4.Successful primary stent placement in the proximal left anterior descending  lesion. Impression: EKG 09/08/2016: Sinus bradycardia at a rate of 46 bpm, left HL enlargement, normal axis, no evidence of ischemia. Current Plans Complete electrocardiogram (93000) Hypertension, essential, benign (I10) Current Plans Started AmLODIPine Besylate 5MG, 1 (one) Tablet daily, #30, 09/08/2016, Ref. x3. Hyperlipidemia, group A (E78.00) Current Plans Discontinued Simvastatin 40MG (interaction with amlodipine). Started Atorvastatin Calcium 40MG,  1 (one) Tablet daily, #30, 09/08/2016, Ref. x3. Local Order: D/C Simvastatin OSA (obstructive sleep apnea) (G47.33) Story: Not on CPAP  Labs: 09/07/2016: TSH 4.41 with creatinine 1.95, eGFR of 33, potassium 4.4, CMP otherwise normal  07/14/2016: TSH 0.415 Current Plans Mechanism of underlying disease process and action of medications discussed with the patient. I discussed primary/secondary prevention and also dietary counseling was done. He presents for evaluation of intermittent episodes, very concerning for unstable angina. He has an established history of CAD s/p PTCA and stenting of the LAD in 2002. Amlodipine was added both for blood pressure control and symptoms of angina. I'll change simvastatin to atorvastatin to avoid drug interaction. Otherwise, he is on appropriate medical therapy. He has been pain-free since using Nitropatch. Given presentation, will schedule for cardiac catheterization, and possible angioplasty. We discussed regarding risks, benefits, alternatives to this including stress testing, CTA, and continued medical therapy. Patient wants to proceed. Understands <1-2% risk of death, stroke, MI, urgent CABG, bleeding, infection, renal failure but not limited to these. We'll bring in early for hydration for renoprotection during procedure. Follow up after cath for reevaluation and further recommendations.  *I have discussed this case with Dr. Einar Gip and he personally examined the patient and participated in formulating the  plan.*    Signed by Gary [name], AGNP-C (09/08/2016 3:45 PM)

## 2016-10-12 ENCOUNTER — Encounter (HOSPITAL_COMMUNITY): Payer: Self-pay | Admitting: Cardiology

## 2016-10-12 DIAGNOSIS — E785 Hyperlipidemia, unspecified: Secondary | ICD-10-CM | POA: Diagnosis not present

## 2016-10-12 DIAGNOSIS — Z87891 Personal history of nicotine dependence: Secondary | ICD-10-CM | POA: Diagnosis not present

## 2016-10-12 DIAGNOSIS — Z7982 Long term (current) use of aspirin: Secondary | ICD-10-CM | POA: Diagnosis not present

## 2016-10-12 DIAGNOSIS — M353 Polymyalgia rheumatica: Secondary | ICD-10-CM | POA: Diagnosis not present

## 2016-10-12 DIAGNOSIS — J449 Chronic obstructive pulmonary disease, unspecified: Secondary | ICD-10-CM | POA: Diagnosis not present

## 2016-10-12 DIAGNOSIS — G4733 Obstructive sleep apnea (adult) (pediatric): Secondary | ICD-10-CM | POA: Diagnosis not present

## 2016-10-12 DIAGNOSIS — I129 Hypertensive chronic kidney disease with stage 1 through stage 4 chronic kidney disease, or unspecified chronic kidney disease: Secondary | ICD-10-CM | POA: Diagnosis not present

## 2016-10-12 DIAGNOSIS — E039 Hypothyroidism, unspecified: Secondary | ICD-10-CM | POA: Diagnosis not present

## 2016-10-12 DIAGNOSIS — I4891 Unspecified atrial fibrillation: Secondary | ICD-10-CM | POA: Diagnosis not present

## 2016-10-12 DIAGNOSIS — N183 Chronic kidney disease, stage 3 (moderate): Secondary | ICD-10-CM | POA: Diagnosis not present

## 2016-10-12 DIAGNOSIS — I209 Angina pectoris, unspecified: Secondary | ICD-10-CM | POA: Diagnosis not present

## 2016-10-12 DIAGNOSIS — K219 Gastro-esophageal reflux disease without esophagitis: Secondary | ICD-10-CM | POA: Diagnosis not present

## 2016-10-12 DIAGNOSIS — Z79899 Other long term (current) drug therapy: Secondary | ICD-10-CM | POA: Diagnosis not present

## 2016-10-12 DIAGNOSIS — Z7952 Long term (current) use of systemic steroids: Secondary | ICD-10-CM | POA: Diagnosis not present

## 2016-10-12 DIAGNOSIS — I25118 Atherosclerotic heart disease of native coronary artery with other forms of angina pectoris: Secondary | ICD-10-CM | POA: Diagnosis not present

## 2016-10-12 LAB — BASIC METABOLIC PANEL
ANION GAP: 9 (ref 5–15)
BUN: 12 mg/dL (ref 6–20)
CALCIUM: 8.6 mg/dL — AB (ref 8.9–10.3)
CO2: 26 mmol/L (ref 22–32)
Chloride: 104 mmol/L (ref 101–111)
Creatinine, Ser: 1.77 mg/dL — ABNORMAL HIGH (ref 0.61–1.24)
GFR calc Af Amer: 42 mL/min — ABNORMAL LOW (ref >60)
GFR calc non Af Amer: 36 mL/min — ABNORMAL LOW (ref >60)
GLUCOSE: 106 mg/dL — AB (ref 65–99)
Potassium: 3.5 mmol/L (ref 3.5–5.1)
Sodium: 139 mmol/L (ref 135–145)

## 2016-10-12 LAB — CBC
HCT: 36.3 % — ABNORMAL LOW (ref 39.0–52.0)
HEMOGLOBIN: 12.2 g/dL — AB (ref 13.0–17.0)
MCH: 33.1 pg (ref 26.0–34.0)
MCHC: 33.6 g/dL (ref 30.0–36.0)
MCV: 98.4 fL (ref 78.0–100.0)
Platelets: 171 10*3/uL (ref 150–400)
RBC: 3.69 MIL/uL — ABNORMAL LOW (ref 4.22–5.81)
RDW: 13.7 % (ref 11.5–15.5)
WBC: 6.1 10*3/uL (ref 4.0–10.5)

## 2016-10-12 MED ORDER — HYDRALAZINE HCL 50 MG PO TABS: 50.0000 mg | ORAL_TABLET | Freq: Three times a day (TID) | ORAL | 1 refills | Status: DC

## 2016-10-12 MED ORDER — HYDRALAZINE HCL 50 MG PO TABS
50.0000 mg | ORAL_TABLET | Freq: Three times a day (TID) | ORAL | Status: DC
  Administered 2016-10-12: 50 mg via ORAL
  Filled 2016-10-12: qty 1

## 2016-10-12 NOTE — Care Management Note (Signed)
Case Management Note  Patient Details  Name: SYMIR MAH MRN: [id number] Date of Birth: [birth year]  Subjective/Objective:    S/p stent intervention, will be on plavix and xarelto, he was already on xarelto.  PTA indep.  No other needs.                Action/Plan:   Expected Discharge Date:                  Expected Discharge Plan:  Home/Self Care  In-House Referral:     Discharge planning Services  CM Consult  Post Acute Care Choice:    Choice offered to:     DME Arranged:    DME Agency:     HH Arranged:    HH Agency:     Status of Service:  Completed, signed off  If discussed at [employer] of Stay Meetings, dates discussed:    Additional Comments:  Zenon Mayo, RN 10/12/2016, 11:23 AM

## 2016-10-12 NOTE — Progress Notes (Signed)
CARDIAC REHAB PHASE I   PRE:  Rate/Rhythm: off tele  BP:  Supine:   Sitting:   Standing: 124/78   SaO2:   MODE:  Ambulation: 400 ft   POST:  Rate/Rhythm: off tele  BP:  Supine:   Sitting:   Standing: 167/79   SaO2:  0925-1012 Pt off tele and stated ready to go home. Pt walked 400 ft with steady gait. No CP. Tolerated well. Reviewed NTG use, heart healthy diet, modified ex ed and importance of plavix with stent. Discussed CRP 2 and will refer to [facility].   Graylon Good, RN BSN  10/12/2016 10:10 AM

## 2016-10-12 NOTE — Discharge Summary (Signed)
Physician Discharge Summary  Patient ID: Gary [name] MRN: [id number] DOB/AGE: 05-20-1941 75 y.o.  Admit date: 10/11/2016 Discharge date: 10/12/2016  Discharge Diagnoses: 1. CAD s/p PTCA and stenting of the mid LAD with implantation of a 2.5 x 12 mm Onyx DES 2. Hyperlipidemia 3. Essential hypertension 4. OSA  Significant Diagnostic Studies: Coronary Angiogram 10/11/2016: Successful PTCA and stenting of the mid LAD with implantation of a 2.5 x 12 mm Onyx DES.  Successful PTCA and bifurcation stenting of LAD-D1 with 3.0x2.5 Tryton DES into the proximal LAD and D1 and native LAD in the proximal and midsegment was stented with a 3.0 x 15 mm Onyx DES. Distal LAD stenosis reduced from 80% to 0%, proximal to mid LAD stenosis reduced from 90% to 0%, D1 stenosis reduced from 80% to 0% with maintenance of TIMI-3 to TIMI-3 flow.  Hospital Course: He is a 75 year old male who presented with chest pain. History of CAD s/p PTCA and stenting of the LAD, last seen by cardiology in 2014 for preoperative cardiac evaluation. 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   Patient's symptoms were at least class III, with frequent use of nitroglycerin and eventually had started to feel better with nitroglycerin patch but had reduced his activity significantly due to fear of recurrence of chest pain. Given his presentation, it is felt best that he proceed directly with coronary angiography.   He was noted to be in A. Fib on admission to the short stay and he spontaneously converted to sinus post PTCA.  After successful angioplasty to obtuse marginal vessel with  balloon angioplasty, it was felt safe for him to be discharged the following morning as his creatinine has remained stable with plans on performing staged intervention to the LAD which was  slightly more complex.    On 10/11/2016, he successful PTCA and bifurcation stenting of LAD-D1 with 3.0x2.5 Tryton DES into the proximal LAD and D1 and native LAD in the proximal and midsegment was stented with a 3.0 x 15 mm Onyx DES.   Recommendations on discharge: Follow up outpatient as scheduled. He will need aggressive risk factor modification. He'll be continued on Xarelto for atrial fibrillation along with Plavix d/t CAD/DES stenting. We'll monitor closely for bleeding complications.   Discharge Exam: Blood pressure (!) 145/80, pulse 65, temperature 98.4 F (36.9 C), temperature source Oral, resp. rate 16, height 6' (1.829 m), weight 83.6 kg (184 lb 4.9 oz), SpO2 98 %.    General appearance: alert, cooperative, appears stated age and no distress Resp: clear to auscultation bilaterally Cardio: regular rate and rhythm, S1, S2 normal, no murmur, click, rub or gallop GI: soft, non-tender; bowel sounds normal; no masses,  no organomegaly Extremities: extremities normal, atraumatic, no cyanosis or edema Pulses: 2+ and symmetric Right radial access has healed well. Neurologic: Grossly normal   Labs:   Lab Results  Component Value Date   WBC 6.1 10/12/2016   HGB 12.2 (L) 10/12/2016   HCT 36.3 (L) 10/12/2016   MCV 98.4 10/12/2016   PLT 171 10/12/2016    Recent Labs Lab 10/12/16 0226  NA 139  K 3.5  CL 104  CO2 26  BUN 12  CREATININE 1.77*  CALCIUM 8.6*  GLUCOSE 106*    Lipid Panel     Component Value Date/Time   CHOL  11/28/2010 0450    130  ATP III CLASSIFICATION:  <200     mg/dL   Desirable  200-239  mg/dL   Borderline High  >=240    mg/dL   High          TRIG 65 11/28/2010 0450   HDL 28 (L) 11/28/2010 0450   CHOLHDL 4.6 11/28/2010 0450   VLDL 13 11/28/2010 0450   LDLCALC  11/28/2010 0450    89        Total Cholesterol/HDL:CHD Risk Coronary Heart Disease Risk Table                     Men   Women  1/2 Average Risk   3.4   3.3  Average Risk       5.0    4.4  2 X Average Risk   9.6   7.1  3 X Average Risk  23.4   11.0        Use the calculated Patient Ratio above and the CHD Risk Table to determine the patient's CHD Risk.        ATP III CLASSIFICATION (LDL):  <100     mg/dL   Optimal  100-129  mg/dL   Near or Above                    Optimal  130-159  mg/dL   Borderline  160-189  mg/dL   High  >190     mg/dL   Very High    BNP (last 3 results) No results for input(s): BNP in the last 8760 hours.  HEMOGLOBIN A1C No results found for: HGBA1C, MPG  Cardiac Panel (last 3 results) No results for input(s): CKTOTAL, CKMB, TROPONINI, RELINDX in the last 8760 hours.  Lab Results  Component Value Date   TROPONINI <0.30 12/22/2012     TSH No results for input(s): TSH in the last 8760 hours.  EKG 10/12/2016: sinus bradycardia at a rate of 50 bpm, PRWP, anterior infarct old. No evidence of ischemia.  Radiology: No results found.  FOLLOW UP PLANS AND APPOINTMENTS    Medication List    TAKE these medications   acyclovir 400 MG tablet Commonly known as:  ZOVIRAX Take 400 mg by mouth daily.   amLODipine 5 MG tablet Commonly known as:  NORVASC Take 5 mg by mouth every evening.   aspirin EC 81 MG tablet Take 81 mg by mouth daily.   atenolol 50 MG tablet Commonly known as:  TENORMIN Take 50 mg by mouth at bedtime.   atorvastatin 40 MG tablet Commonly known as:  LIPITOR Take 40 mg by mouth every evening.   Calcium-D 600-400 MG-UNIT Tabs Take 1 mg by mouth daily.   cetirizine 10 MG tablet Commonly known as:  ZYRTEC Take 10 mg by mouth daily.   clopidogrel 75 MG tablet Commonly known as:  PLAVIX Take 1 tablet (75 mg total) by mouth daily with breakfast.   diazepam 5 MG tablet Commonly known as:  VALIUM Take 1 tablet (5 mg total) by mouth every 12 (twelve) hours as needed for anxiety. For anxiety What changed:  when to take this  reasons to take this  additional instructions   EPINEPHrine 0.3 mg/0.3 mL  Soaj injection Commonly known as:  EPI-PEN Inject 0.3 mg into the muscle as directed.   GLUCOSAMINE 1500 COMPLEX PO Take 1 capsule by mouth daily.   hydrALAZINE 50 MG tablet Commonly known as:  APRESOLINE Take 1 tablet (50 mg total) by mouth  3 (three) times daily.   levothyroxine 100 MCG tablet Commonly known as:  SYNTHROID, LEVOTHROID Take 100 mcg by mouth at bedtime.   magnesium oxide 400 MG tablet Commonly known as:  MAG-OX Take 400 mg by mouth at bedtime.   meclizine 25 MG tablet Commonly known as:  ANTIVERT Take 25 mg by mouth every 12 (twelve) hours as needed for dizziness.   mirtazapine 15 MG tablet Commonly known as:  REMERON Take 15 mg by mouth at bedtime.   nitroGLYCERIN 0.1 mg/hr patch Commonly known as:  NITRODUR - Dosed in mg/24 hr Place 0.1 mg onto the skin daily.   omega-3 acid ethyl esters 1 g capsule Commonly known as:  LOVAZA Take 1 g by mouth at bedtime.   omeprazole 20 MG capsule Commonly known as:  PRILOSEC Take 20 mg by mouth at bedtime.   oxyCODONE 15 MG immediate release tablet Commonly known as:  ROXICODONE Take 15 mg by mouth every 4 (four) hours as needed for pain.   polyethylene glycol packet Commonly known as:  MIRALAX / GLYCOLAX Take 17 g by mouth daily as needed for mild constipation.   predniSONE 5 MG tablet Commonly known as:  DELTASONE Take 5 mg by mouth at bedtime.   Rivaroxaban 15 MG Tabs tablet Commonly known as:  XARELTO Take 1 tablet (15 mg total) by mouth daily with supper.   sertraline 100 MG tablet Commonly known as:  ZOLOFT Take 100 mg by mouth at bedtime.   triamcinolone cream 0.1 % Commonly known as:  KENALOG Apply 1 application topically 2 (two) times daily.   zaleplon 10 MG capsule Commonly known as:  SONATA Take 10 mg by mouth at bedtime.      Follow-up Information    Adrian Prows, MD Follow up on 10/25/2016.   Specialty:  Cardiology Why:  at 3:15pm Contact information: [address] [place] [address] [phone]            Rachel Bo, NP-C 10/12/2016, 8:32 AM  Pager: [phone] Office: [phone] If no answer: [phone]

## 2016-10-18 ENCOUNTER — Encounter (HOSPITAL_COMMUNITY): Payer: Self-pay | Admitting: Cardiology

## 2016-10-25 DIAGNOSIS — E7800 Pure hypercholesterolemia, unspecified: Secondary | ICD-10-CM | POA: Diagnosis not present

## 2016-10-25 DIAGNOSIS — G4733 Obstructive sleep apnea (adult) (pediatric): Secondary | ICD-10-CM | POA: Diagnosis not present

## 2016-10-25 DIAGNOSIS — I10 Essential (primary) hypertension: Secondary | ICD-10-CM | POA: Diagnosis not present

## 2016-10-25 DIAGNOSIS — I25119 Atherosclerotic heart disease of native coronary artery with unspecified angina pectoris: Secondary | ICD-10-CM | POA: Diagnosis not present

## 2016-10-26 DIAGNOSIS — I10 Essential (primary) hypertension: Secondary | ICD-10-CM | POA: Diagnosis not present

## 2016-10-26 DIAGNOSIS — Z7901 Long term (current) use of anticoagulants: Secondary | ICD-10-CM | POA: Diagnosis not present

## 2016-10-26 DIAGNOSIS — N183 Chronic kidney disease, stage 3 (moderate): Secondary | ICD-10-CM | POA: Diagnosis not present

## 2016-10-26 DIAGNOSIS — Z0000 Encounter for general adult medical examination without abnormal findings: Secondary | ICD-10-CM | POA: Diagnosis not present

## 2016-10-26 DIAGNOSIS — M8580 Other specified disorders of bone density and structure, unspecified site: Secondary | ICD-10-CM | POA: Diagnosis not present

## 2016-10-26 DIAGNOSIS — K219 Gastro-esophageal reflux disease without esophagitis: Secondary | ICD-10-CM | POA: Diagnosis not present

## 2016-10-26 DIAGNOSIS — Z125 Encounter for screening for malignant neoplasm of prostate: Secondary | ICD-10-CM | POA: Diagnosis not present

## 2016-10-30 ENCOUNTER — Encounter (HOSPITAL_COMMUNITY): Payer: Self-pay | Admitting: Cardiology

## 2016-11-08 DIAGNOSIS — H35371 Puckering of macula, right eye: Secondary | ICD-10-CM | POA: Diagnosis not present

## 2016-11-08 DIAGNOSIS — Z961 Presence of intraocular lens: Secondary | ICD-10-CM | POA: Diagnosis not present

## 2016-11-08 DIAGNOSIS — H3093 Unspecified chorioretinal inflammation, bilateral: Secondary | ICD-10-CM | POA: Diagnosis not present

## 2016-11-08 DIAGNOSIS — H3581 Retinal edema: Secondary | ICD-10-CM | POA: Diagnosis not present

## 2016-11-08 DIAGNOSIS — H30033 Focal chorioretinal inflammation, peripheral, bilateral: Secondary | ICD-10-CM | POA: Diagnosis not present

## 2016-11-08 DIAGNOSIS — H40013 Open angle with borderline findings, low risk, bilateral: Secondary | ICD-10-CM | POA: Diagnosis not present

## 2016-11-28 DIAGNOSIS — M5134 Other intervertebral disc degeneration, thoracic region: Secondary | ICD-10-CM | POA: Diagnosis not present

## 2016-11-28 DIAGNOSIS — M546 Pain in thoracic spine: Secondary | ICD-10-CM | POA: Diagnosis not present

## 2016-11-28 DIAGNOSIS — G894 Chronic pain syndrome: Secondary | ICD-10-CM | POA: Diagnosis not present

## 2016-11-28 DIAGNOSIS — Z79891 Long term (current) use of opiate analgesic: Secondary | ICD-10-CM | POA: Diagnosis not present

## 2016-12-09 DIAGNOSIS — I25119 Atherosclerotic heart disease of native coronary artery with unspecified angina pectoris: Secondary | ICD-10-CM | POA: Diagnosis not present

## 2016-12-09 DIAGNOSIS — I129 Hypertensive chronic kidney disease with stage 1 through stage 4 chronic kidney disease, or unspecified chronic kidney disease: Secondary | ICD-10-CM | POA: Diagnosis not present

## 2016-12-09 DIAGNOSIS — I480 Paroxysmal atrial fibrillation: Secondary | ICD-10-CM | POA: Diagnosis not present

## 2016-12-09 DIAGNOSIS — I10 Essential (primary) hypertension: Secondary | ICD-10-CM | POA: Diagnosis not present

## 2016-12-20 DIAGNOSIS — R0609 Other forms of dyspnea: Secondary | ICD-10-CM | POA: Diagnosis not present

## 2016-12-30 DIAGNOSIS — Z85828 Personal history of other malignant neoplasm of skin: Secondary | ICD-10-CM | POA: Diagnosis not present

## 2016-12-30 DIAGNOSIS — L570 Actinic keratosis: Secondary | ICD-10-CM | POA: Diagnosis not present

## 2016-12-30 DIAGNOSIS — L853 Xerosis cutis: Secondary | ICD-10-CM | POA: Diagnosis not present

## 2016-12-30 DIAGNOSIS — L400 Psoriasis vulgaris: Secondary | ICD-10-CM | POA: Diagnosis not present

## 2017-01-02 ENCOUNTER — Telehealth (HOSPITAL_COMMUNITY): Payer: Self-pay | Admitting: Internal Medicine

## 2017-01-02 ENCOUNTER — Encounter (HOSPITAL_COMMUNITY): Payer: Self-pay | Admitting: Internal Medicine

## 2017-01-02 NOTE — Progress Notes (Signed)
Mailed letter with Cardiac Rehab Program along with my chart message... KJ

## 2017-01-04 ENCOUNTER — Telehealth (HOSPITAL_COMMUNITY): Payer: Self-pay | Admitting: Internal Medicine

## 2017-01-04 NOTE — Telephone Encounter (Signed)
Verified patients insurance benefits through [facility] Medicare with Passport. Co-Pay $200.00, No Deductible, No Co-Insurance,  Out of Pocket $6700.00 pt has met $115.41, responsibility is [phone]. Reference # [id number]... KJ

## 2017-01-18 DIAGNOSIS — L408 Other psoriasis: Secondary | ICD-10-CM | POA: Diagnosis not present

## 2017-01-18 DIAGNOSIS — M353 Polymyalgia rheumatica: Secondary | ICD-10-CM | POA: Diagnosis not present

## 2017-01-18 DIAGNOSIS — Z1212 Encounter for screening for malignant neoplasm of rectum: Secondary | ICD-10-CM | POA: Diagnosis not present

## 2017-01-18 DIAGNOSIS — E039 Hypothyroidism, unspecified: Secondary | ICD-10-CM | POA: Diagnosis not present

## 2017-01-18 DIAGNOSIS — Z0001 Encounter for general adult medical examination with abnormal findings: Secondary | ICD-10-CM | POA: Diagnosis not present

## 2017-01-24 DIAGNOSIS — I480 Paroxysmal atrial fibrillation: Secondary | ICD-10-CM | POA: Diagnosis not present

## 2017-01-24 DIAGNOSIS — I25119 Atherosclerotic heart disease of native coronary artery with unspecified angina pectoris: Secondary | ICD-10-CM | POA: Diagnosis not present

## 2017-01-24 DIAGNOSIS — R0602 Shortness of breath: Secondary | ICD-10-CM | POA: Diagnosis not present

## 2017-01-24 DIAGNOSIS — I10 Essential (primary) hypertension: Secondary | ICD-10-CM | POA: Diagnosis not present

## 2017-02-16 DIAGNOSIS — I10 Essential (primary) hypertension: Secondary | ICD-10-CM | POA: Diagnosis not present

## 2017-02-23 DIAGNOSIS — I10 Essential (primary) hypertension: Secondary | ICD-10-CM | POA: Diagnosis not present

## 2017-02-23 DIAGNOSIS — R001 Bradycardia, unspecified: Secondary | ICD-10-CM | POA: Diagnosis not present

## 2017-02-23 DIAGNOSIS — R0789 Other chest pain: Secondary | ICD-10-CM | POA: Diagnosis not present

## 2017-03-08 DIAGNOSIS — R0789 Other chest pain: Secondary | ICD-10-CM | POA: Diagnosis not present

## 2017-03-08 DIAGNOSIS — I10 Essential (primary) hypertension: Secondary | ICD-10-CM | POA: Diagnosis not present

## 2017-03-08 DIAGNOSIS — R001 Bradycardia, unspecified: Secondary | ICD-10-CM | POA: Diagnosis not present

## 2017-03-16 DIAGNOSIS — E039 Hypothyroidism, unspecified: Secondary | ICD-10-CM | POA: Diagnosis not present

## 2017-03-30 DIAGNOSIS — M5134 Other intervertebral disc degeneration, thoracic region: Secondary | ICD-10-CM | POA: Diagnosis not present

## 2017-03-30 DIAGNOSIS — M546 Pain in thoracic spine: Secondary | ICD-10-CM | POA: Diagnosis not present

## 2017-03-30 DIAGNOSIS — Z79891 Long term (current) use of opiate analgesic: Secondary | ICD-10-CM | POA: Diagnosis not present

## 2017-03-30 DIAGNOSIS — G894 Chronic pain syndrome: Secondary | ICD-10-CM | POA: Diagnosis not present

## 2017-05-02 DIAGNOSIS — I10 Essential (primary) hypertension: Secondary | ICD-10-CM | POA: Diagnosis not present

## 2017-05-02 DIAGNOSIS — I480 Paroxysmal atrial fibrillation: Secondary | ICD-10-CM | POA: Diagnosis not present

## 2017-05-02 DIAGNOSIS — I25119 Atherosclerotic heart disease of native coronary artery with unspecified angina pectoris: Secondary | ICD-10-CM | POA: Diagnosis not present

## 2017-05-08 DIAGNOSIS — L570 Actinic keratosis: Secondary | ICD-10-CM | POA: Diagnosis not present

## 2017-05-08 DIAGNOSIS — L858 Other specified epidermal thickening: Secondary | ICD-10-CM | POA: Diagnosis not present

## 2017-05-08 DIAGNOSIS — C44622 Squamous cell carcinoma of skin of right upper limb, including shoulder: Secondary | ICD-10-CM | POA: Diagnosis not present

## 2017-07-26 DIAGNOSIS — I10 Essential (primary) hypertension: Secondary | ICD-10-CM | POA: Diagnosis not present

## 2017-07-26 DIAGNOSIS — Z23 Encounter for immunization: Secondary | ICD-10-CM | POA: Diagnosis not present

## 2017-07-26 DIAGNOSIS — R251 Tremor, unspecified: Secondary | ICD-10-CM | POA: Diagnosis not present

## 2017-07-26 DIAGNOSIS — I951 Orthostatic hypotension: Secondary | ICD-10-CM | POA: Diagnosis not present

## 2017-07-27 DIAGNOSIS — D649 Anemia, unspecified: Secondary | ICD-10-CM | POA: Diagnosis not present

## 2017-07-31 DIAGNOSIS — M546 Pain in thoracic spine: Secondary | ICD-10-CM | POA: Diagnosis not present

## 2017-07-31 DIAGNOSIS — M5134 Other intervertebral disc degeneration, thoracic region: Secondary | ICD-10-CM | POA: Diagnosis not present

## 2017-07-31 DIAGNOSIS — G894 Chronic pain syndrome: Secondary | ICD-10-CM | POA: Diagnosis not present

## 2017-07-31 DIAGNOSIS — Z79891 Long term (current) use of opiate analgesic: Secondary | ICD-10-CM | POA: Diagnosis not present

## 2017-08-01 DIAGNOSIS — I951 Orthostatic hypotension: Secondary | ICD-10-CM | POA: Diagnosis not present

## 2017-08-01 DIAGNOSIS — R899 Unspecified abnormal finding in specimens from other organs, systems and tissues: Secondary | ICD-10-CM | POA: Diagnosis not present

## 2017-08-01 DIAGNOSIS — I10 Essential (primary) hypertension: Secondary | ICD-10-CM | POA: Diagnosis not present

## 2017-08-01 DIAGNOSIS — I2510 Atherosclerotic heart disease of native coronary artery without angina pectoris: Secondary | ICD-10-CM | POA: Diagnosis not present

## 2017-08-16 DIAGNOSIS — H40013 Open angle with borderline findings, low risk, bilateral: Secondary | ICD-10-CM | POA: Diagnosis not present

## 2017-08-16 DIAGNOSIS — H52203 Unspecified astigmatism, bilateral: Secondary | ICD-10-CM | POA: Diagnosis not present

## 2017-08-16 DIAGNOSIS — H524 Presbyopia: Secondary | ICD-10-CM | POA: Diagnosis not present

## 2017-08-16 DIAGNOSIS — H5213 Myopia, bilateral: Secondary | ICD-10-CM | POA: Diagnosis not present

## 2017-08-22 DIAGNOSIS — H3581 Retinal edema: Secondary | ICD-10-CM | POA: Diagnosis not present

## 2017-08-22 DIAGNOSIS — H40013 Open angle with borderline findings, low risk, bilateral: Secondary | ICD-10-CM | POA: Diagnosis not present

## 2017-08-22 DIAGNOSIS — Z961 Presence of intraocular lens: Secondary | ICD-10-CM | POA: Diagnosis not present

## 2017-08-22 DIAGNOSIS — H30033 Focal chorioretinal inflammation, peripheral, bilateral: Secondary | ICD-10-CM | POA: Diagnosis not present

## 2017-09-07 DIAGNOSIS — N2581 Secondary hyperparathyroidism of renal origin: Secondary | ICD-10-CM | POA: Diagnosis not present

## 2017-09-07 DIAGNOSIS — N189 Chronic kidney disease, unspecified: Secondary | ICD-10-CM | POA: Diagnosis not present

## 2017-09-07 DIAGNOSIS — E785 Hyperlipidemia, unspecified: Secondary | ICD-10-CM | POA: Diagnosis not present

## 2017-09-07 DIAGNOSIS — D631 Anemia in chronic kidney disease: Secondary | ICD-10-CM | POA: Diagnosis not present

## 2017-09-07 DIAGNOSIS — I10 Essential (primary) hypertension: Secondary | ICD-10-CM | POA: Diagnosis not present

## 2017-09-07 DIAGNOSIS — N183 Chronic kidney disease, stage 3 (moderate): Secondary | ICD-10-CM | POA: Diagnosis not present

## 2017-09-19 ENCOUNTER — Encounter: Payer: Self-pay | Admitting: Neurology

## 2017-09-19 ENCOUNTER — Ambulatory Visit: Payer: Medicare Other | Admitting: Neurology

## 2017-09-19 VITALS — BP 110/80 | HR 72 | Ht 72.0 in | Wt 176.0 lb

## 2017-09-19 DIAGNOSIS — H81399 Other peripheral vertigo, unspecified ear: Secondary | ICD-10-CM | POA: Diagnosis not present

## 2017-09-19 DIAGNOSIS — R251 Tremor, unspecified: Secondary | ICD-10-CM | POA: Diagnosis not present

## 2017-09-19 NOTE — Progress Notes (Signed)
Subjective:    Patient ID: Gary [name] is a 76 y.o. male.  HPI     Star Age, MD, PhD [facility] [address] [place], [place] [address]  Dear Dr. Shelia Media,   I saw your patient, Gary [name], upon your kind request, in my neurologic clinic today for initial consultation of his tremor. The patient is unaccompanied today. As you know, Gary [name] is a 76 year old right-handed gentleman with an underlying complex medical history of psoriasis, polymyalgia rheumatica, neck pain and back pain, hypothyroidism, anxiety, chronic kidney disease, vitamin D deficiency, heart disease with status post stent placement in 2017, OSA, allergic rhinitis, reflux disease, recurrent vertigo, history of oral cancer, restless leg syndrome, hypertension, and hyperlipidemia, who reports a longerstanding history of b/l hand tremors some 10-15 years. No FHx of ET. He has 2 sons. He lives alone. His wife died in 23-Dec-2014. His tremor has become worse with time, more so with his handwriting. He does notice a worsening when he gets nervous. He has a longstanding history of insomnia and has taken prescription medication for it, but now uses alcohol about 4-6 oz at 10 PM.  I reviewed your office note from 07/26/2017. Blood work from 03/16/2017 showed normal TSH, normal T4. Of note, he is on multiple medications including Xarelto, oxycodone, 3-4 times a day, glucosamine, mirtazapine 15 mg qHS, baby aspirin, Lovaza, amlodipine, atorvastatin, sertraline 100 mg qHS, prednisone 5 mg daily, Synthroid, atenolol 50 mg once or twice daily, meclizine as needed, diazepam 5 mg up to twice daily, pantoprazole. For his intermittent vertigo he takes meclizine. He has had episodic vertigo for years. He has done physical therapy for this.  His Past Medical History Is Significant For: Past Medical History:  Diagnosis Date  . Anemia    "chronic anemic"  . Anginal pain ([place])   . Anxiety   .  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 Cancer ([place])    pre-cancerous lesions removed 11/22/12 and hx of  . 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 Chronic back pain    hx of  . Chronic kidney disease    renal insufficiency, [place] kidney  . Coronary artery disease    stent placement 2000, does not see cardiologist at this time  . Dyslipidemia   . Full dentures    Edentulous since age [age]  . GERD (gastroesophageal reflux disease)   . Gout    pseudo gout  . Hearing impairment    job related, hbilat hearing aids  . Hepatitis    hepatitis "C"  . History of radiation therapy 03/11/2013-04/23/2013   right retromolar trigone/54Gy/79fx  . Hypertension    sees Dr. Deland Pretty  . Hypertension   . Hypothyroid   . Insomnia    cpap  . Rheumatoid arthritis ([place])    "all over" (09/13/2016)  . Sleep apnea    "tried mask 3 different times; couldn't tolerate it" (09/13/2016)  . Thyroid disease    Hypothyroidism    His Past Surgical History Is Significant For: Past Surgical History:  Procedure Laterality Date  . CATARACT EXTRACTION W/ INTRAOCULAR LENS  IMPLANT, BILATERAL Bilateral 2007  . CORONARY ANGIOPLASTY  09/13/2016  . CORONARY ANGIOPLASTY WITH STENT PLACEMENT  2002  . FRACTURE SURGERY    . MULTIPLE TOOTH EXTRACTIONS  ~ 1962   Edentulous  . RADICAL NECK DISSECTION Right 01/22/2013   [facility]  .  SKIN CANCER EXCISION     "face, ears"  . SKIN GRAFT Left ~ 1961   "grafted skin off my stomach & put it on left hand that I had caught in fan belt"  . TONSILLECTOMY      His Family History Is Significant For: Family History  Problem Relation Age of Onset  . Coronary artery disease Brother 92  . Heart attack Brother   . Coronary artery disease Father        stents in 70's  . Cancer Mother        bone    His Social History Is Significant For: Social History   Socioeconomic History  . Marital status: Widowed    Spouse  name: None  . Number of children: 2  . Years of education: None  . Highest education level: None  Social Needs  . Financial resource strain: None  . Food insecurity - worry: None  . Food insecurity - inability: None  . Transportation needs - medical: None  . Transportation needs - non-medical: None  Occupational History  . Occupation: Retired    Fish farm manager: RETIRED    Comment: Engineer, building services  Tobacco Use  . Smoking status: Former Smoker    Packs/day: 1.00    Years: 27.00    Pack years: 27.00    Types: Cigarettes    Last attempt to quit: 11/08/1983    Years since quitting: 33.8  . Smokeless tobacco: Never Used  Substance and Sexual Activity  . Alcohol use: Yes    Comment: 09/13/2016 "nothing since 12/2012"  . Drug use: No  . Sexual activity: None  Other Topics Concern  . None  Social History Narrative  . None    His Allergies Are:  Allergies  Allergen Reactions  . Bee Venom Swelling  . Cellcept [Mycophenolate Mofetil] Other (See Comments)    Reaction unknown  . Colcrys [Colchicine] Other (See Comments)    myalgias  . Erythromycin Other (See Comments)    Reaction unknown  . Methotrexate Derivatives Hives, Itching and Rash    Only in tablet form  . Neosporin [Neomycin-Bacitracin Zn-Polymyx] Rash  . Trazodone Palpitations    Weakness, loss of appetite  . Ultram [Tramadol] Rash  :   His Current Medications Are:  Outpatient Encounter Medications as of 09/19/2017  Medication Sig  . acyclovir (ZOVIRAX) 400 MG tablet Take 400 mg by mouth daily.   [name] amLODipine (NORVASC) 5 MG tablet Take 5 mg by mouth every evening.   [name] aspirin EC 81 MG tablet Take 81 mg by mouth daily.  [name] atenolol (TENORMIN) 50 MG tablet Take 50 mg by mouth at bedtime.   [name] atorvastatin (LIPITOR) 40 MG tablet Take 40 mg by mouth every evening.   . Calcium Carbonate-Vitamin D (CALCIUM-D) 600-400 MG-UNIT TABS Take 1 mg by mouth daily.   . cetirizine (ZYRTEC) 10 MG tablet Take 10 mg by mouth daily.   .  diazepam (VALIUM) 5 MG tablet Take 1 tablet (5 mg total) by mouth every 12 (twelve) hours as needed for anxiety. For anxiety (Patient taking differently: Take 5 mg by mouth at bedtime as needed (sleep). For anxiety)  . EPINEPHrine 0.3 mg/0.3 mL IJ SOAJ injection Inject 0.3 mg into the muscle as directed.   . Glucosamine-Chondroit-Vit C-Mn (GLUCOSAMINE 1500 COMPLEX PO) Take 1 capsule by mouth daily.  . hydrALAZINE (APRESOLINE) 50 MG tablet Take 1 tablet (50 mg total) by mouth 3 (three) times daily.  [name] levothyroxine (SYNTHROID, LEVOTHROID) 112 MCG tablet  Take 112 mcg daily before breakfast by mouth.  . magnesium oxide (MAG-OX) 400 MG tablet Take 400 mg by mouth at bedtime.  . meclizine (ANTIVERT) 25 MG tablet Take 25 mg by mouth every 12 (twelve) hours as needed for dizziness.   . mirtazapine (REMERON) 15 MG tablet Take 15 mg by mouth at bedtime.  . nitroGLYCERIN (NITRODUR - DOSED IN MG/24 HR) 0.1 mg/hr patch Place 0.1 mg onto the skin daily.   [name] omega-3 acid ethyl esters (LOVAZA) 1 G capsule Take 1 g by mouth at bedtime.   [name] omeprazole (PRILOSEC) 20 MG capsule Take 20 mg by mouth at bedtime.   [name] oxyCODONE (ROXICODONE) 15 MG immediate release tablet Take 15 mg by mouth every 4 (four) hours as needed for pain.   . polyethylene glycol (MIRALAX / GLYCOLAX) packet Take 17 g by mouth daily as needed for mild constipation.   . predniSONE (DELTASONE) 5 MG tablet Take 5 mg by mouth at bedtime.   . Rivaroxaban (XARELTO) 15 MG TABS tablet Take 1 tablet (15 mg total) by mouth daily with supper. (Patient taking differently: Take 15 mg every 3 (three) days by mouth. )  . sertraline (ZOLOFT) 100 MG tablet Take 100 mg by mouth at bedtime.   . simvastatin (ZOCOR) 40 MG tablet Take 40 mg daily by mouth.  . triamcinolone cream (KENALOG) 0.1 % Apply 1 application topically 2 (two) times daily.   . zaleplon (SONATA) 10 MG capsule Take 10 mg by mouth at bedtime.  . [DISCONTINUED] clopidogrel (PLAVIX) 75 MG tablet Take 1  tablet (75 mg total) by mouth daily with breakfast.   No facility-administered encounter medications on file as of 09/19/2017.   :   Review of Systems:  Out of a complete 14 point review of systems, all are reviewed and negative with the exception of these symptoms as listed below:  Review of Systems  Neurological:       Pt presents today to discuss his tremors in both hands. Pt has noticed that when he gets nervous, he can hardly write due to the shaking.    Objective:  Neurological Exam  Physical Exam Physical Examination:   Vitals:   09/19/17 1437  BP: 110/80  Pulse: 72   General Examination: The patient is a very pleasant 76 y.o. male in no acute distress. He appears well-developed and well-nourished and well groomed.   HEENT: Normocephalic, atraumatic, pupils are equal, round and reactive to light and accommodation. He wears corrective eyeglasses. Extraocular tracking is good without limitation to gaze excursion or nystagmus noted. Normal smooth pursuit is noted. Hearing is mildly impaired. He has bilateral hearing aids in place. Face is symmetric with normal facial animation and normal facial sensation. Speech is clear with no dysarthria noted. There is no hypophonia. There is no lip, neck/head, jaw or voice tremor. Neck is supple with full range of passive and active motion. There are no carotid bruits on auscultation. Oropharynx exam reveals: moderate mouth dryness, adequate dental hygiene and full dentures in place. Tongue protrudes centrally and palate elevates symmetrically.   Chest: Clear to auscultation without wheezing, rhonchi or crackles noted.  Heart: S1+S2+0, regular and normal without murmurs, rubs or gallops noted.   Abdomen: Soft, non-tender and non-distended with normal bowel sounds appreciated on auscultation.  Extremities: There is no pitting edema in the distal lower extremities bilaterally. Pedal pulses are intact.  Skin: Warm and dry without trophic  changes noted.  Musculoskeletal: exam reveals no obvious joint deformities,  tenderness or joint swelling or erythema.   Neurologically:  Mental status: The patient is awake, alert and oriented in all 4 spheres. His immediate and remote memory, attention, language skills and fund of knowledge are appropriate. There is no evidence of aphasia, agnosia, apraxia or anomia. Speech is clear with normal prosody and enunciation. Thought process is linear. Mood is normal and affect is normal.  Cranial nerves II - XII are as described above under HEENT exam. In addition: shoulder shrug is normal with equal shoulder height noted. Motor exam: Normal bulk, strength and tone is noted. There is no drift, tremor or rebound. Romberg is negative. Reflexes are 1-2+ throughout.  On 09/19/2017: On handwriting testing he has mild tremulousness, no micrographia, legible handwriting. On Archimedes spiral drawing he has mild trembling bilaterally. He has no resting tremor. He has a mild bilateral hand postural tremor and minimal action tremor. Fine motor skills and coordination: intact with normal finger taps, normal hand movements, normal rapid alternating patting, normal foot taps and normal foot agility.  Cerebellar testing: No dysmetria or intention tremor on finger to nose testing. Heel to shin is unremarkable bilaterally. There is no truncal or gait ataxia.  Sensory exam: intact to light touch in the upper and lower extremities.  Gait, station and balance: He stands easily. No veering to one side is noted. No leaning to one side is noted. Posture is age-appropriate and stance is narrow based. Gait shows normal stride length and normal pace. No problems turning are noted. Tandem walk is not possible for him.  Assessment and Plan:  In summary, REVIS WHALIN is a very pleasant 76 y.o.-year old male with an underlying complex medical history of psoriasis, polymyalgia rheumatica, neck pain and back pain, hypothyroidism,  anxiety, chronic kidney disease, vitamin D deficiency, heart disease with status post stent placement in 2017, OSA, allergic rhinitis, reflux disease, recurrent vertigo, history of oral cancer, restless leg syndrome, hypertension, and hyperlipidemia, who presents for consultation of his hand tremor. He has noted a hand tremor years ago. It has gradually become worse. On examination, he has a mild bilateral upper extremity postural and action tremor. No signs of parkinsonism. He is reassured in this regard. Given his age and multiple medications I would not recommend symptomatic treatment for his tremor at this time. He is taking several potentially sedating medications and is advised not to drink alcohol with his current medication regimen, particularly not to utilize alcohol for sleep at night as it could interact with his medications and cause balance problems and severe grogginess. He is advised to stay well-hydrated with water. I suggested we proceed with a brain MRI without contrast to rule out a structural cause of his symptoms. So long as his brain MRI is within normal range or age-appropriate for him I suggested as needed follow-up with me. I answered all his questions today and he was in agreement. Thank you very much for allowing me to participate in the care of this nice patient. If I can be of any further assistance to you please do not hesitate to call me at [phone].  Sincerely,   Star Age, MD, PhD

## 2017-09-19 NOTE — Patient Instructions (Addendum)
  Please remember, that any kind of tremor may be exacerbated by anxiety, anger, nervousness, excitement, dehydration, sleep deprivation, by caffeine, and low blood sugar values or blood sugar fluctuations. Some medications, especially some antidepressants and lithium can cause or exacerbate tremors. Tremors may temporarily calm down or subside with the use of a benzodiazepine such as Valium or related medications and with alcohol. Be aware, however, that drinking alcohol is not an approved or appropriate treatment for tremor control and long-term use of benzodiazepines such as Valium, lorazepam, alprazolam, or clonazepam can cause habit formation, physical and psychological addiction. There are very few medications that symptomatically help with tremor reduction, none are without potential side effects.   Please do not utilize alcohol for sleep, it may interfere with several of your medication and cause balance issues and severe grogginess.   Your tremor is mild. Since you are on several medications already, I would not recommend any symptomatic treatment at this time.   We will do a brain scan, called MRI and call you with the test results. We will have to schedule you for this on a separate date. This test requires authorization from your insurance, and we will take care of the insurance process. So long as your brain MRI is age appropriate, I can see you back as needed.

## 2017-09-27 ENCOUNTER — Encounter (HOSPITAL_COMMUNITY): Payer: Medicare Other

## 2017-10-04 ENCOUNTER — Ambulatory Visit
Admission: RE | Admit: 2017-10-04 | Discharge: 2017-10-04 | Disposition: A | Discharge status: Home / Self Care | Payer: Medicare Other | Source: Ambulatory Visit | Attending: Neurology | Admitting: Neurology

## 2017-10-04 ENCOUNTER — Other Ambulatory Visit (HOSPITAL_COMMUNITY): Payer: Self-pay

## 2017-10-04 DIAGNOSIS — R251 Tremor, unspecified: Secondary | ICD-10-CM | POA: Diagnosis not present

## 2017-10-04 DIAGNOSIS — H81399 Other peripheral vertigo, unspecified ear: Secondary | ICD-10-CM

## 2017-10-05 ENCOUNTER — Ambulatory Visit (HOSPITAL_COMMUNITY)
Admission: RE | Admit: 2017-10-05 | Discharge: 2017-10-05 | Disposition: A | Discharge status: Home / Self Care | Payer: Medicare Other | Source: Ambulatory Visit | Attending: Nephrology | Admitting: Nephrology

## 2017-10-05 DIAGNOSIS — D631 Anemia in chronic kidney disease: Secondary | ICD-10-CM | POA: Diagnosis not present

## 2017-10-05 DIAGNOSIS — N189 Chronic kidney disease, unspecified: Secondary | ICD-10-CM | POA: Diagnosis not present

## 2017-10-05 MED ORDER — FERUMOXYTOL INJECTION 510 MG/17 ML
510.0000 mg | INTRAVENOUS | Status: DC
  Administered 2017-10-05: 510 mg via INTRAVENOUS
  Filled 2017-10-05: qty 17

## 2017-10-05 NOTE — Discharge Instructions (Signed)

## 2017-10-05 NOTE — Progress Notes (Signed)
These call patient regarding his brain MRI. His brain MRI without contrast on 10/04/2017 shows no structural cause for his tremors. He had some areas with brain volume loss. We call this atrophy, this was mostly age-appropriate, some areas with more moderate atrophy, symmetrical and again not an explanation for his tremors either.  He also had some changes in the deeper structures of the brain, which we call white matter changes or microvascular changes. These were reported as mild in His case. These are tiny white spots, that occur with time and are seen in a variety of conditions, including with normal aging, chronic hypertension, chronic headaches, especially migraine HAs, chronic diabetes, chronic hyperlipidemia. These are not strokes and no mass or lesion were seen which is reassuring. Again, there were no acute findings, such as a stroke, or mass or blood products. No further action is required on this test at this time, other than re-enforcing the importance of good blood pressure control, good cholesterol control, good blood sugar control, and weight management.  At this juncture, patient can follow-up with his primary care physician as discussed. Thanks,  Star Age, MD, PhD

## 2017-10-09 ENCOUNTER — Telehealth: Payer: Self-pay

## 2017-10-09 NOTE — Telephone Encounter (Signed)
-----   Message from Star Age, MD sent at 10/05/2017  6:28 PM EST ----- These call patient regarding his brain MRI. His brain MRI without contrast on 10/04/2017 shows no structural cause for his tremors. He had some areas with brain volume loss. We call this atrophy, this was mostly age-appropriate, some areas with more moderate atrophy, symmetrical and again not an explanation for his tremors either.  He also had some changes in the deeper structures of the brain, which we call white matter changes or microvascular changes. These were reported as mild in His case. These are tiny white spots, that occur with time and are seen in a variety of conditions, including with normal aging, chronic hypertension, chronic headaches, especially migraine HAs, chronic diabetes, chronic hyperlipidemia. These are not strokes and no mass or lesion were seen which is reassuring. Again, there were no acute findings, such as a stroke, or mass or blood products. No further action is required on this test at this time, other than re-enforcing the importance of good blood pressure control, good cholesterol control, good blood sugar control, and weight management.  At this juncture, patient can follow-up with his primary care physician as discussed. Thanks,  Star Age, MD, PhD

## 2017-10-09 NOTE — Telephone Encounter (Signed)
I called pt to discuss. No answer, left a message asking him to call me back. 

## 2017-10-10 NOTE — Telephone Encounter (Signed)
Pt returned my call. I advised him that his MRI brain showed no structural cause for his tremors, but did show some atrophy, mostly age-appropriate, and some areas with more moderate atrophy, symmetrical and again not an explanation for his tremors. I advised him that he has mild white matter changes and I explained that conditions that cause white matter changes. I encouraged pt to keep a good BP, blood sugar, weight, and blood sugar. Pt is agreeable to following up with Dr. Shelia Media and asked that a copy of this MRI be sent to Dr. Shelia Media. Pt verbalized understanding of results. Pt had no questions at this time but was encouraged to call back if questions arise.

## 2017-10-13 ENCOUNTER — Ambulatory Visit (HOSPITAL_COMMUNITY)
Admission: RE | Admit: 2017-10-13 | Discharge: 2017-10-13 | Disposition: A | Discharge status: Home / Self Care | Payer: Medicare Other | Source: Ambulatory Visit | Attending: Nephrology | Admitting: Nephrology

## 2017-10-13 DIAGNOSIS — D631 Anemia in chronic kidney disease: Secondary | ICD-10-CM | POA: Insufficient documentation

## 2017-10-13 MED ORDER — FERUMOXYTOL INJECTION 510 MG/17 ML
510.0000 mg | INTRAVENOUS | Status: DC
  Administered 2017-10-13: 510 mg via INTRAVENOUS
  Filled 2017-10-13: qty 17

## 2017-11-03 DIAGNOSIS — Z85828 Personal history of other malignant neoplasm of skin: Secondary | ICD-10-CM | POA: Diagnosis not present

## 2017-11-03 DIAGNOSIS — L821 Other seborrheic keratosis: Secondary | ICD-10-CM | POA: Diagnosis not present

## 2017-11-03 DIAGNOSIS — L853 Xerosis cutis: Secondary | ICD-10-CM | POA: Diagnosis not present

## 2017-11-03 DIAGNOSIS — D692 Other nonthrombocytopenic purpura: Secondary | ICD-10-CM | POA: Diagnosis not present

## 2017-11-03 DIAGNOSIS — L570 Actinic keratosis: Secondary | ICD-10-CM | POA: Diagnosis not present

## 2017-11-20 DIAGNOSIS — D631 Anemia in chronic kidney disease: Secondary | ICD-10-CM | POA: Diagnosis not present

## 2018-01-22 DIAGNOSIS — K219 Gastro-esophageal reflux disease without esophagitis: Secondary | ICD-10-CM | POA: Diagnosis not present

## 2018-01-22 DIAGNOSIS — Z8639 Personal history of other endocrine, nutritional and metabolic disease: Secondary | ICD-10-CM | POA: Diagnosis not present

## 2018-01-22 DIAGNOSIS — Z7982 Long term (current) use of aspirin: Secondary | ICD-10-CM | POA: Diagnosis not present

## 2018-01-22 DIAGNOSIS — N183 Chronic kidney disease, stage 3 (moderate): Secondary | ICD-10-CM | POA: Diagnosis not present

## 2018-01-22 DIAGNOSIS — E785 Hyperlipidemia, unspecified: Secondary | ICD-10-CM | POA: Diagnosis not present

## 2018-01-22 DIAGNOSIS — Z125 Encounter for screening for malignant neoplasm of prostate: Secondary | ICD-10-CM | POA: Diagnosis not present

## 2018-01-22 DIAGNOSIS — I2510 Atherosclerotic heart disease of native coronary artery without angina pectoris: Secondary | ICD-10-CM | POA: Diagnosis not present

## 2018-01-22 DIAGNOSIS — E559 Vitamin D deficiency, unspecified: Secondary | ICD-10-CM | POA: Diagnosis not present

## 2018-01-22 DIAGNOSIS — E039 Hypothyroidism, unspecified: Secondary | ICD-10-CM | POA: Diagnosis not present

## 2018-01-22 DIAGNOSIS — I4891 Unspecified atrial fibrillation: Secondary | ICD-10-CM | POA: Diagnosis not present

## 2018-01-22 DIAGNOSIS — R251 Tremor, unspecified: Secondary | ICD-10-CM | POA: Diagnosis not present

## 2018-01-25 DIAGNOSIS — M8580 Other specified disorders of bone density and structure, unspecified site: Secondary | ICD-10-CM | POA: Diagnosis not present

## 2018-01-25 DIAGNOSIS — R251 Tremor, unspecified: Secondary | ICD-10-CM | POA: Diagnosis not present

## 2018-01-25 DIAGNOSIS — I4891 Unspecified atrial fibrillation: Secondary | ICD-10-CM | POA: Diagnosis not present

## 2018-01-25 DIAGNOSIS — M542 Cervicalgia: Secondary | ICD-10-CM | POA: Diagnosis not present

## 2018-01-25 DIAGNOSIS — Z0001 Encounter for general adult medical examination with abnormal findings: Secondary | ICD-10-CM | POA: Diagnosis not present

## 2018-01-31 DIAGNOSIS — G894 Chronic pain syndrome: Secondary | ICD-10-CM | POA: Diagnosis not present

## 2018-01-31 DIAGNOSIS — Z79891 Long term (current) use of opiate analgesic: Secondary | ICD-10-CM | POA: Diagnosis not present

## 2018-02-05 DIAGNOSIS — M810 Age-related osteoporosis without current pathological fracture: Secondary | ICD-10-CM | POA: Diagnosis not present

## 2018-02-07 DIAGNOSIS — Z7952 Long term (current) use of systemic steroids: Secondary | ICD-10-CM | POA: Diagnosis not present

## 2018-02-07 DIAGNOSIS — M1180 Other specified crystal arthropathies, unspecified site: Secondary | ICD-10-CM | POA: Diagnosis not present

## 2018-02-07 DIAGNOSIS — L409 Psoriasis, unspecified: Secondary | ICD-10-CM | POA: Diagnosis not present

## 2018-02-07 DIAGNOSIS — M810 Age-related osteoporosis without current pathological fracture: Secondary | ICD-10-CM | POA: Diagnosis not present

## 2018-02-07 DIAGNOSIS — Z8739 Personal history of other diseases of the musculoskeletal system and connective tissue: Secondary | ICD-10-CM | POA: Diagnosis not present

## 2018-02-12 DIAGNOSIS — I4891 Unspecified atrial fibrillation: Secondary | ICD-10-CM | POA: Diagnosis not present

## 2018-02-12 DIAGNOSIS — R0989 Other specified symptoms and signs involving the circulatory and respiratory systems: Secondary | ICD-10-CM | POA: Diagnosis not present

## 2018-02-14 DIAGNOSIS — R251 Tremor, unspecified: Secondary | ICD-10-CM | POA: Diagnosis not present

## 2018-02-14 DIAGNOSIS — I4891 Unspecified atrial fibrillation: Secondary | ICD-10-CM | POA: Diagnosis not present

## 2018-02-15 DIAGNOSIS — R0989 Other specified symptoms and signs involving the circulatory and respiratory systems: Secondary | ICD-10-CM | POA: Diagnosis not present

## 2018-02-19 DIAGNOSIS — I10 Essential (primary) hypertension: Secondary | ICD-10-CM | POA: Diagnosis not present

## 2018-02-19 DIAGNOSIS — I517 Cardiomegaly: Secondary | ICD-10-CM | POA: Diagnosis not present

## 2018-02-19 DIAGNOSIS — R0989 Other specified symptoms and signs involving the circulatory and respiratory systems: Secondary | ICD-10-CM | POA: Diagnosis not present

## 2018-02-19 DIAGNOSIS — I4891 Unspecified atrial fibrillation: Secondary | ICD-10-CM | POA: Diagnosis not present

## 2018-03-14 DIAGNOSIS — R251 Tremor, unspecified: Secondary | ICD-10-CM | POA: Diagnosis not present

## 2018-05-04 DIAGNOSIS — L308 Other specified dermatitis: Secondary | ICD-10-CM | POA: Diagnosis not present

## 2018-05-04 DIAGNOSIS — D485 Neoplasm of uncertain behavior of skin: Secondary | ICD-10-CM | POA: Diagnosis not present

## 2018-05-04 DIAGNOSIS — L570 Actinic keratosis: Secondary | ICD-10-CM | POA: Diagnosis not present

## 2018-05-04 DIAGNOSIS — Z85828 Personal history of other malignant neoplasm of skin: Secondary | ICD-10-CM | POA: Diagnosis not present

## 2018-05-04 DIAGNOSIS — L821 Other seborrheic keratosis: Secondary | ICD-10-CM | POA: Diagnosis not present

## 2018-05-04 DIAGNOSIS — D0339 Melanoma in situ of other parts of face: Secondary | ICD-10-CM | POA: Diagnosis not present

## 2018-05-04 DIAGNOSIS — D692 Other nonthrombocytopenic purpura: Secondary | ICD-10-CM | POA: Diagnosis not present

## 2018-05-21 ENCOUNTER — Ambulatory Visit: Payer: Medicare Other | Admitting: Urgent Care

## 2018-05-21 ENCOUNTER — Encounter: Payer: Self-pay | Admitting: Urgent Care

## 2018-05-21 VITALS — BP 145/91 | HR 60 | Temp 98.0°F | Resp 16 | Ht 72.0 in

## 2018-05-21 DIAGNOSIS — S61412A Laceration without foreign body of left hand, initial encounter: Secondary | ICD-10-CM | POA: Diagnosis not present

## 2018-05-21 DIAGNOSIS — M79642 Pain in left hand: Secondary | ICD-10-CM

## 2018-05-21 NOTE — Progress Notes (Signed)
    MRN: [id number] DOB: [birth year]  Subjective:   Gary [name] is a 77 y.o. male presenting for suffering a laceration to his left hand last night.  Patient was trying to "a 1 bottle using a knife, was controlled with it and suffered a laceration to the left backside of his hand near his thumb.  He is on anticoagulation with Xarelto and has had a difficult time stopping the bleeding all night.  He reports that he is up-to-date on his Tdap.  Denies dizziness, confusion, weakness, numbness or tingling.  Reports that he can use his hand, denies loss of range of motion.  Diane has a current medication list which includes the following prescription(s): acyclovir, amlodipine, aspirin ec, atenolol, atorvastatin, calcium-d, cetirizine, diazepam, epinephrine, glucosamine-chondroit-vit c-mn, hydralazine, levothyroxine, magnesium oxide, meclizine, mirtazapine, nitroglycerin, omega-3 acid ethyl esters, omeprazole, oxycodone, polyethylene glycol, prednisone, rivaroxaban, sertraline, simvastatin, triamcinolone cream, and zaleplon. Also is allergic to bee venom; cellcept [mycophenolate mofetil]; colcrys [colchicine]; erythromycin; methotrexate derivatives; neosporin [neomycin-bacitracin zn-polymyx]; trazodone; and ultram [tramadol].  Gary [name]  has a past medical history of Anemia, Anginal pain ([place]), Anxiety, Birdshot choroidopathy of both eyes (1990s), Cancer ([place]), Cancer ([place]) (10/01/12), Chronic back pain, Chronic kidney disease, Coronary artery disease, Dyslipidemia, Full dentures, GERD (gastroesophageal reflux disease), Gout, Hearing impairment, Hepatitis, History of radiation therapy (03/11/2013-04/23/2013), Hypertension, Hypertension, Hypothyroid, Insomnia, Rheumatoid arthritis ([place]), Sleep apnea, and Thyroid disease. Also  has a past surgical history that includes Tonsillectomy; Hip pinning, cannulated (Left, 12/22/2012); Radical neck dissection (Right, 01/22/2013); Coronary angioplasty with stent (2002); Coronary  angioplasty (09/13/2016); Fracture surgery; Skin cancer excision; Multiple tooth extractions (~ 1962); Cataract extraction w/ intraocular lens  implant, bilateral (Bilateral, 2007); Skin graft (Left, ~ 1961); Cardiac catheterization (N/A, 09/13/2016); and Cardiac catheterization (N/A, 10/11/2016).  Objective:   Vitals: BP (!) 145/91   Pulse 60   Temp 98 F (36.7 C) (Oral)   Resp 16   Ht 6' (1.829 m)   SpO2 96%   BMI 23.87 kg/m   Physical Exam  Constitutional: He is oriented to person, place, and time. He appears well-developed and well-nourished.  Cardiovascular: Normal rate.  Pulmonary/Chest: Effort normal.  Musculoskeletal:       Left hand: He exhibits laceration. He exhibits normal range of motion, no tenderness, no bony tenderness, normal capillary refill, no deformity and no swelling. Normal sensation noted. Normal strength noted.       Hands: Neurological: He is alert and oriented to person, place, and time.   PROCEDURE NOTE: laceration repair Verbal consent obtained from patient.  Local anesthesia with 2cc Lidocaine 2% with epinephrine.  Wound explored for tendon, ligament damage. Wound scrubbed with soap and water and rinsed. Wound closed with #1 4-0 Prolene (horizontal mattress) sutures.  Hemostasis achieved with persistent firm pressure over wound.  Wound cleansed and dressed.   Assessment and Plan :   Laceration of hand without complication, excluding fingers, left, initial encounter  Left hand pain  Laceration repaired successfully.  Pressure dressing applied.  Wound care reviewed.  Return to clinic in 10 days for suture removal.  Return to clinic precautions discussed.  Jaynee Eagles, PA-C [facility] [phone] 05/21/2018  8:52 AM

## 2018-05-21 NOTE — Patient Instructions (Addendum)
WOUND CARE Please return in 10 days to have your stitches/staples removed or sooner if you have concerns. [name] Keep area clean and dry for 24 hours. Do not remove bandage, if applied. . After 24 hours, remove bandage and wash wound gently with mild soap and warm water. Reapply a new bandage after cleaning wound, if directed. . Continue daily cleansing with soap and water until stitches/staples are removed. . Do not apply any ointments or creams to the wound while stitches/staples are in place, as this may cause delayed healing. . Notify the office if you experience any of the following signs of infection: Swelling, redness, pus drainage, streaking, fever >101.0 F . Notify the office if you experience excessive bleeding that does not stop after 15-20 minutes of constant, firm pressure.      IF you received an x-ray today, you will receive an invoice from [facility]. Please contact [facility] at [phone] with questions or concerns regarding your invoice.   IF you received labwork today, you will receive an invoice from [facility]. Please contact [facility] at [phone] with questions or concerns regarding your invoice.   Our billing staff will not be able to assist you with questions regarding bills from these companies.  You will be contacted with the lab results as soon as they are available. The fastest way to get your results is to activate your My Chart account. Instructions are located on the last page of this paperwork. If you have not heard from [place] regarding the results in 2 weeks, please contact this office.

## 2018-05-23 ENCOUNTER — Ambulatory Visit: Payer: Medicare Other | Admitting: Physician Assistant

## 2018-05-29 DIAGNOSIS — D0339 Melanoma in situ of other parts of face: Secondary | ICD-10-CM | POA: Diagnosis not present

## 2018-05-29 DIAGNOSIS — C4339 Malignant melanoma of other parts of face: Secondary | ICD-10-CM | POA: Diagnosis not present

## 2018-05-30 DIAGNOSIS — M545 Low back pain: Secondary | ICD-10-CM | POA: Diagnosis not present

## 2018-05-30 DIAGNOSIS — G894 Chronic pain syndrome: Secondary | ICD-10-CM | POA: Diagnosis not present

## 2018-05-31 ENCOUNTER — Ambulatory Visit: Payer: Medicare Other | Admitting: Urgent Care

## 2018-06-27 DIAGNOSIS — K146 Glossodynia: Secondary | ICD-10-CM | POA: Diagnosis not present

## 2018-06-29 ENCOUNTER — Other Ambulatory Visit: Payer: Self-pay | Admitting: Otolaryngology

## 2018-06-29 DIAGNOSIS — K146 Glossodynia: Secondary | ICD-10-CM

## 2018-07-10 ENCOUNTER — Ambulatory Visit
Admission: RE | Admit: 2018-07-10 | Discharge: 2018-07-10 | Disposition: A | Discharge status: Home / Self Care | Payer: Medicare Other | Source: Ambulatory Visit | Attending: Otolaryngology | Admitting: Otolaryngology

## 2018-07-10 DIAGNOSIS — K146 Glossodynia: Secondary | ICD-10-CM | POA: Diagnosis not present

## 2018-07-10 MED ORDER — IOPAMIDOL (ISOVUE-300) INJECTION 61%
75.0000 mL | Freq: Once | INTRAVENOUS | Status: AC | PRN
  Administered 2018-07-10: 75 mL via INTRAVENOUS

## 2018-08-22 DIAGNOSIS — Z23 Encounter for immunization: Secondary | ICD-10-CM | POA: Diagnosis not present

## 2018-08-22 DIAGNOSIS — I10 Essential (primary) hypertension: Secondary | ICD-10-CM | POA: Diagnosis not present

## 2018-09-07 DIAGNOSIS — H30033 Focal chorioretinal inflammation, peripheral, bilateral: Secondary | ICD-10-CM | POA: Diagnosis not present

## 2018-09-07 DIAGNOSIS — H3581 Retinal edema: Secondary | ICD-10-CM | POA: Diagnosis not present

## 2018-09-07 DIAGNOSIS — H35372 Puckering of macula, left eye: Secondary | ICD-10-CM | POA: Diagnosis not present

## 2018-09-07 DIAGNOSIS — H40013 Open angle with borderline findings, low risk, bilateral: Secondary | ICD-10-CM | POA: Diagnosis not present

## 2018-09-07 DIAGNOSIS — Z961 Presence of intraocular lens: Secondary | ICD-10-CM | POA: Diagnosis not present

## 2018-10-01 DIAGNOSIS — Z5181 Encounter for therapeutic drug level monitoring: Secondary | ICD-10-CM | POA: Diagnosis not present

## 2018-10-01 DIAGNOSIS — M545 Low back pain: Secondary | ICD-10-CM | POA: Diagnosis not present

## 2018-10-01 DIAGNOSIS — M5136 Other intervertebral disc degeneration, lumbar region: Secondary | ICD-10-CM | POA: Diagnosis not present

## 2018-10-01 DIAGNOSIS — G894 Chronic pain syndrome: Secondary | ICD-10-CM | POA: Diagnosis not present

## 2018-10-01 DIAGNOSIS — Z79899 Other long term (current) drug therapy: Secondary | ICD-10-CM | POA: Diagnosis not present

## 2018-11-20 DIAGNOSIS — L820 Inflamed seborrheic keratosis: Secondary | ICD-10-CM | POA: Diagnosis not present

## 2018-11-20 DIAGNOSIS — Z85828 Personal history of other malignant neoplasm of skin: Secondary | ICD-10-CM | POA: Diagnosis not present

## 2018-11-20 DIAGNOSIS — D692 Other nonthrombocytopenic purpura: Secondary | ICD-10-CM | POA: Diagnosis not present

## 2018-11-20 DIAGNOSIS — L853 Xerosis cutis: Secondary | ICD-10-CM | POA: Diagnosis not present

## 2018-11-20 DIAGNOSIS — L570 Actinic keratosis: Secondary | ICD-10-CM | POA: Diagnosis not present

## 2019-01-07 DIAGNOSIS — H8113 Benign paroxysmal vertigo, bilateral: Secondary | ICD-10-CM | POA: Diagnosis not present

## 2019-01-21 DIAGNOSIS — H8113 Benign paroxysmal vertigo, bilateral: Secondary | ICD-10-CM | POA: Diagnosis not present

## 2019-01-24 DIAGNOSIS — N183 Chronic kidney disease, stage 3 (moderate): Secondary | ICD-10-CM | POA: Diagnosis not present

## 2019-01-24 DIAGNOSIS — N2581 Secondary hyperparathyroidism of renal origin: Secondary | ICD-10-CM | POA: Diagnosis not present

## 2019-01-24 DIAGNOSIS — E785 Hyperlipidemia, unspecified: Secondary | ICD-10-CM | POA: Diagnosis not present

## 2019-01-24 DIAGNOSIS — I129 Hypertensive chronic kidney disease with stage 1 through stage 4 chronic kidney disease, or unspecified chronic kidney disease: Secondary | ICD-10-CM | POA: Diagnosis not present

## 2019-01-24 DIAGNOSIS — N189 Chronic kidney disease, unspecified: Secondary | ICD-10-CM | POA: Diagnosis not present

## 2019-01-24 DIAGNOSIS — D631 Anemia in chronic kidney disease: Secondary | ICD-10-CM | POA: Diagnosis not present

## 2019-01-25 DIAGNOSIS — E039 Hypothyroidism, unspecified: Secondary | ICD-10-CM | POA: Diagnosis not present

## 2019-01-25 DIAGNOSIS — K219 Gastro-esophageal reflux disease without esophagitis: Secondary | ICD-10-CM | POA: Diagnosis not present

## 2019-01-25 DIAGNOSIS — I10 Essential (primary) hypertension: Secondary | ICD-10-CM | POA: Diagnosis not present

## 2019-02-20 DIAGNOSIS — I4891 Unspecified atrial fibrillation: Secondary | ICD-10-CM | POA: Diagnosis not present

## 2019-02-20 DIAGNOSIS — I10 Essential (primary) hypertension: Secondary | ICD-10-CM | POA: Diagnosis not present

## 2019-02-20 DIAGNOSIS — R42 Dizziness and giddiness: Secondary | ICD-10-CM | POA: Diagnosis not present

## 2019-03-06 DIAGNOSIS — I340 Nonrheumatic mitral (valve) insufficiency: Secondary | ICD-10-CM | POA: Diagnosis not present

## 2019-03-06 DIAGNOSIS — I517 Cardiomegaly: Secondary | ICD-10-CM | POA: Diagnosis not present

## 2019-03-06 DIAGNOSIS — I4891 Unspecified atrial fibrillation: Secondary | ICD-10-CM | POA: Diagnosis not present

## 2019-03-06 DIAGNOSIS — I2510 Atherosclerotic heart disease of native coronary artery without angina pectoris: Secondary | ICD-10-CM | POA: Diagnosis not present

## 2019-03-08 DIAGNOSIS — R42 Dizziness and giddiness: Secondary | ICD-10-CM | POA: Diagnosis not present

## 2019-03-19 ENCOUNTER — Other Ambulatory Visit: Payer: Self-pay

## 2019-03-19 ENCOUNTER — Encounter (HOSPITAL_COMMUNITY): Payer: Self-pay | Admitting: *Deleted

## 2019-03-19 ENCOUNTER — Emergency Department (HOSPITAL_COMMUNITY): Payer: Medicare Other

## 2019-03-19 ENCOUNTER — Inpatient Hospital Stay (HOSPITAL_COMMUNITY): Payer: Medicare Other

## 2019-03-19 ENCOUNTER — Inpatient Hospital Stay (HOSPITAL_COMMUNITY)
Admission: EM | Admit: 2019-03-19 | Discharge: 2019-03-23 | DRG: 417 | Disposition: A | Discharge status: Home Health | Payer: Medicare Other | Attending: Family Medicine | Admitting: Family Medicine

## 2019-03-19 DIAGNOSIS — R1011 Right upper quadrant pain: Secondary | ICD-10-CM | POA: Diagnosis not present

## 2019-03-19 DIAGNOSIS — Z01810 Encounter for preprocedural cardiovascular examination: Secondary | ICD-10-CM | POA: Diagnosis not present

## 2019-03-19 DIAGNOSIS — R109 Unspecified abdominal pain: Secondary | ICD-10-CM | POA: Diagnosis not present

## 2019-03-19 DIAGNOSIS — G4700 Insomnia, unspecified: Secondary | ICD-10-CM | POA: Diagnosis present

## 2019-03-19 DIAGNOSIS — Z20828 Contact with and (suspected) exposure to other viral communicable diseases: Secondary | ICD-10-CM | POA: Diagnosis present

## 2019-03-19 DIAGNOSIS — Z923 Personal history of irradiation: Secondary | ICD-10-CM

## 2019-03-19 DIAGNOSIS — Z8249 Family history of ischemic heart disease and other diseases of the circulatory system: Secondary | ICD-10-CM

## 2019-03-19 DIAGNOSIS — M069 Rheumatoid arthritis, unspecified: Secondary | ICD-10-CM | POA: Diagnosis not present

## 2019-03-19 DIAGNOSIS — Z7901 Long term (current) use of anticoagulants: Secondary | ICD-10-CM | POA: Diagnosis not present

## 2019-03-19 DIAGNOSIS — Z7401 Bed confinement status: Secondary | ICD-10-CM | POA: Diagnosis not present

## 2019-03-19 DIAGNOSIS — D649 Anemia, unspecified: Secondary | ICD-10-CM | POA: Diagnosis not present

## 2019-03-19 DIAGNOSIS — K838 Other specified diseases of biliary tract: Secondary | ICD-10-CM | POA: Diagnosis not present

## 2019-03-19 DIAGNOSIS — E039 Hypothyroidism, unspecified: Secondary | ICD-10-CM | POA: Diagnosis present

## 2019-03-19 DIAGNOSIS — J189 Pneumonia, unspecified organism: Secondary | ICD-10-CM | POA: Diagnosis present

## 2019-03-19 DIAGNOSIS — R1111 Vomiting without nausea: Secondary | ICD-10-CM | POA: Diagnosis not present

## 2019-03-19 DIAGNOSIS — K08109 Complete loss of teeth, unspecified cause, unspecified class: Secondary | ICD-10-CM | POA: Diagnosis not present

## 2019-03-19 DIAGNOSIS — Z972 Presence of dental prosthetic device (complete) (partial): Secondary | ICD-10-CM

## 2019-03-19 DIAGNOSIS — J168 Pneumonia due to other specified infectious organisms: Secondary | ICD-10-CM | POA: Diagnosis not present

## 2019-03-19 DIAGNOSIS — B1920 Unspecified viral hepatitis C without hepatic coma: Secondary | ICD-10-CM | POA: Diagnosis present

## 2019-03-19 DIAGNOSIS — Z8701 Personal history of pneumonia (recurrent): Secondary | ICD-10-CM

## 2019-03-19 DIAGNOSIS — G4733 Obstructive sleep apnea (adult) (pediatric): Secondary | ICD-10-CM | POA: Diagnosis present

## 2019-03-19 DIAGNOSIS — H9193 Unspecified hearing loss, bilateral: Secondary | ICD-10-CM | POA: Diagnosis present

## 2019-03-19 DIAGNOSIS — M353 Polymyalgia rheumatica: Secondary | ICD-10-CM | POA: Diagnosis not present

## 2019-03-19 DIAGNOSIS — R112 Nausea with vomiting, unspecified: Secondary | ICD-10-CM | POA: Diagnosis not present

## 2019-03-19 DIAGNOSIS — M545 Low back pain: Secondary | ICD-10-CM | POA: Diagnosis present

## 2019-03-19 DIAGNOSIS — R197 Diarrhea, unspecified: Secondary | ICD-10-CM | POA: Diagnosis not present

## 2019-03-19 DIAGNOSIS — K810 Acute cholecystitis: Secondary | ICD-10-CM | POA: Diagnosis present

## 2019-03-19 DIAGNOSIS — I2510 Atherosclerotic heart disease of native coronary artery without angina pectoris: Secondary | ICD-10-CM | POA: Diagnosis not present

## 2019-03-19 DIAGNOSIS — K812 Acute cholecystitis with chronic cholecystitis: Secondary | ICD-10-CM | POA: Diagnosis not present

## 2019-03-19 DIAGNOSIS — R911 Solitary pulmonary nodule: Secondary | ICD-10-CM | POA: Diagnosis not present

## 2019-03-19 DIAGNOSIS — K819 Cholecystitis, unspecified: Secondary | ICD-10-CM | POA: Diagnosis not present

## 2019-03-19 DIAGNOSIS — I495 Sick sinus syndrome: Secondary | ICD-10-CM | POA: Diagnosis not present

## 2019-03-19 DIAGNOSIS — Z7952 Long term (current) use of systemic steroids: Secondary | ICD-10-CM

## 2019-03-19 DIAGNOSIS — R1110 Vomiting, unspecified: Secondary | ICD-10-CM

## 2019-03-19 DIAGNOSIS — Z888 Allergy status to other drugs, medicaments and biological substances status: Secondary | ICD-10-CM

## 2019-03-19 DIAGNOSIS — J449 Chronic obstructive pulmonary disease, unspecified: Secondary | ICD-10-CM | POA: Diagnosis not present

## 2019-03-19 DIAGNOSIS — Z79899 Other long term (current) drug therapy: Secondary | ICD-10-CM

## 2019-03-19 DIAGNOSIS — R001 Bradycardia, unspecified: Secondary | ICD-10-CM | POA: Diagnosis not present

## 2019-03-19 DIAGNOSIS — Z91030 Bee allergy status: Secondary | ICD-10-CM

## 2019-03-19 DIAGNOSIS — I10 Essential (primary) hypertension: Secondary | ICD-10-CM | POA: Diagnosis present

## 2019-03-19 DIAGNOSIS — G8929 Other chronic pain: Secondary | ICD-10-CM | POA: Diagnosis present

## 2019-03-19 DIAGNOSIS — I480 Paroxysmal atrial fibrillation: Secondary | ICD-10-CM | POA: Diagnosis present

## 2019-03-19 DIAGNOSIS — N183 Chronic kidney disease, stage 3 (moderate): Secondary | ICD-10-CM | POA: Diagnosis not present

## 2019-03-19 DIAGNOSIS — Z87891 Personal history of nicotine dependence: Secondary | ICD-10-CM

## 2019-03-19 DIAGNOSIS — J441 Chronic obstructive pulmonary disease with (acute) exacerbation: Secondary | ICD-10-CM | POA: Diagnosis not present

## 2019-03-19 DIAGNOSIS — M109 Gout, unspecified: Secondary | ICD-10-CM | POA: Diagnosis present

## 2019-03-19 DIAGNOSIS — K219 Gastro-esophageal reflux disease without esophagitis: Secondary | ICD-10-CM | POA: Diagnosis not present

## 2019-03-19 DIAGNOSIS — Z881 Allergy status to other antibiotic agents status: Secondary | ICD-10-CM

## 2019-03-19 DIAGNOSIS — Z955 Presence of coronary angioplasty implant and graft: Secondary | ICD-10-CM

## 2019-03-19 DIAGNOSIS — Z808 Family history of malignant neoplasm of other organs or systems: Secondary | ICD-10-CM

## 2019-03-19 DIAGNOSIS — M2550 Pain in unspecified joint: Secondary | ICD-10-CM | POA: Diagnosis not present

## 2019-03-19 DIAGNOSIS — I129 Hypertensive chronic kidney disease with stage 1 through stage 4 chronic kidney disease, or unspecified chronic kidney disease: Secondary | ICD-10-CM | POA: Diagnosis not present

## 2019-03-19 DIAGNOSIS — Z85828 Personal history of other malignant neoplasm of skin: Secondary | ICD-10-CM

## 2019-03-19 DIAGNOSIS — Z7982 Long term (current) use of aspirin: Secondary | ICD-10-CM | POA: Diagnosis not present

## 2019-03-19 DIAGNOSIS — Z9842 Cataract extraction status, left eye: Secondary | ICD-10-CM

## 2019-03-19 DIAGNOSIS — R079 Chest pain, unspecified: Secondary | ICD-10-CM | POA: Diagnosis not present

## 2019-03-19 DIAGNOSIS — R52 Pain, unspecified: Secondary | ICD-10-CM | POA: Diagnosis not present

## 2019-03-19 DIAGNOSIS — K8013 Calculus of gallbladder with acute and chronic cholecystitis with obstruction: Principal | ICD-10-CM | POA: Diagnosis present

## 2019-03-19 DIAGNOSIS — Z87898 Personal history of other specified conditions: Secondary | ICD-10-CM

## 2019-03-19 DIAGNOSIS — Z974 Presence of external hearing-aid: Secondary | ICD-10-CM

## 2019-03-19 DIAGNOSIS — Z8589 Personal history of malignant neoplasm of other organs and systems: Secondary | ICD-10-CM

## 2019-03-19 DIAGNOSIS — K8066 Calculus of gallbladder and bile duct with acute and chronic cholecystitis without obstruction: Secondary | ICD-10-CM | POA: Diagnosis not present

## 2019-03-19 DIAGNOSIS — K8020 Calculus of gallbladder without cholecystitis without obstruction: Secondary | ICD-10-CM | POA: Diagnosis not present

## 2019-03-19 DIAGNOSIS — Z9841 Cataract extraction status, right eye: Secondary | ICD-10-CM

## 2019-03-19 DIAGNOSIS — Z79890 Hormone replacement therapy (postmenopausal): Secondary | ICD-10-CM

## 2019-03-19 DIAGNOSIS — K8012 Calculus of gallbladder with acute and chronic cholecystitis without obstruction: Secondary | ICD-10-CM | POA: Diagnosis not present

## 2019-03-19 DIAGNOSIS — E785 Hyperlipidemia, unspecified: Secondary | ICD-10-CM | POA: Diagnosis present

## 2019-03-19 DIAGNOSIS — Z961 Presence of intraocular lens: Secondary | ICD-10-CM | POA: Diagnosis present

## 2019-03-19 DIAGNOSIS — R110 Nausea: Secondary | ICD-10-CM | POA: Diagnosis not present

## 2019-03-19 LAB — COMPREHENSIVE METABOLIC PANEL
ALT: 20 U/L (ref 0–44)
AST: 39 U/L (ref 15–41)
Albumin: 3.3 g/dL — ABNORMAL LOW (ref 3.5–5.0)
Alkaline Phosphatase: 89 U/L (ref 38–126)
Anion gap: 9 (ref 5–15)
BUN: 14 mg/dL (ref 8–23)
CO2: 27 mmol/L (ref 22–32)
Calcium: 9.1 mg/dL (ref 8.9–10.3)
Chloride: 102 mmol/L (ref 98–111)
Creatinine, Ser: 1.49 mg/dL — ABNORMAL HIGH (ref 0.61–1.24)
GFR calc Af Amer: 51 mL/min — ABNORMAL LOW (ref >60)
GFR calc non Af Amer: 44 mL/min — ABNORMAL LOW (ref >60)
Glucose, Bld: 158 mg/dL — ABNORMAL HIGH (ref 70–99)
Potassium: 3.7 mmol/L (ref 3.5–5.1)
Sodium: 138 mmol/L (ref 135–145)
Total Bilirubin: 0.5 mg/dL (ref 0.3–1.2)
Total Protein: 6.8 g/dL (ref 6.5–8.1)

## 2019-03-19 LAB — URINALYSIS, ROUTINE W REFLEX MICROSCOPIC
Bilirubin Urine: NEGATIVE
Glucose, UA: NEGATIVE mg/dL
Hgb urine dipstick: NEGATIVE
Ketones, ur: NEGATIVE mg/dL
Leukocytes,Ua: NEGATIVE
Nitrite: NEGATIVE
Protein, ur: NEGATIVE mg/dL
Specific Gravity, Urine: 1.009 (ref 1.005–1.030)
pH: 8 (ref 5.0–8.0)

## 2019-03-19 LAB — CBC WITH DIFFERENTIAL/PLATELET
Abs Immature Granulocytes: 0.02 10*3/uL (ref 0.00–0.07)
Basophils Absolute: 0 10*3/uL (ref 0.0–0.1)
Basophils Relative: 0 %
Eosinophils Absolute: 0.5 10*3/uL (ref 0.0–0.5)
Eosinophils Relative: 7 %
HCT: 38.2 % — ABNORMAL LOW (ref 39.0–52.0)
Hemoglobin: 12.1 g/dL — ABNORMAL LOW (ref 13.0–17.0)
Immature Granulocytes: 0 %
Lymphocytes Relative: 14 %
Lymphs Abs: 0.9 10*3/uL (ref 0.7–4.0)
MCH: 31.8 pg (ref 26.0–34.0)
MCHC: 31.7 g/dL (ref 30.0–36.0)
MCV: 100.5 fL — ABNORMAL HIGH (ref 80.0–100.0)
Monocytes Absolute: 0.4 10*3/uL (ref 0.1–1.0)
Monocytes Relative: 6 %
Neutro Abs: 4.8 10*3/uL (ref 1.7–7.7)
Neutrophils Relative %: 73 %
Platelets: 226 10*3/uL (ref 150–400)
RBC: 3.8 MIL/uL — ABNORMAL LOW (ref 4.22–5.81)
RDW: 14.7 % (ref 11.5–15.5)
WBC: 6.6 10*3/uL (ref 4.0–10.5)
nRBC: 0 % (ref 0.0–0.2)

## 2019-03-19 LAB — PROTIME-INR
INR: 1.1 (ref 0.8–1.2)
Prothrombin Time: 14 seconds (ref 11.4–15.2)

## 2019-03-19 LAB — LIPASE, BLOOD: Lipase: 28 U/L (ref 11–51)

## 2019-03-19 LAB — SARS CORONAVIRUS 2 BY RT PCR (HOSPITAL ORDER, PERFORMED IN ~~LOC~~ HOSPITAL LAB): SARS Coronavirus 2: NEGATIVE

## 2019-03-19 MED ORDER — ONDANSETRON HCL 4 MG/2ML IJ SOLN
4.0000 mg | Freq: Four times a day (QID) | INTRAMUSCULAR | Status: DC | PRN
  Administered 2019-03-20: 4 mg via INTRAVENOUS

## 2019-03-19 MED ORDER — MORPHINE BOLUS VIA INFUSION: 3.0000 mg | Freq: Once | INTRAVENOUS | Status: DC

## 2019-03-19 MED ORDER — SODIUM CHLORIDE 0.9 % IV SOLN
100.0000 mg | Freq: Once | INTRAVENOUS | Status: AC
  Administered 2019-03-19: 05:00:00 100 mg via INTRAVENOUS
  Filled 2019-03-19: qty 100

## 2019-03-19 MED ORDER — TECHNETIUM TC 99M MEBROFENIN IV KIT
5.5000 | PACK | Freq: Once | INTRAVENOUS | Status: AC
  Administered 2019-03-19: 12:00:00 5.5 via INTRAVENOUS

## 2019-03-19 MED ORDER — DIAZEPAM 5 MG PO TABS
5.0000 mg | ORAL_TABLET | Freq: Every evening | ORAL | Status: DC | PRN
  Administered 2019-03-19 – 2019-03-20 (×2): 5 mg via ORAL
  Filled 2019-03-19 (×2): qty 1

## 2019-03-19 MED ORDER — ACETAMINOPHEN 650 MG RE SUPP: 650.0000 mg | Freq: Four times a day (QID) | RECTAL | Status: DC | PRN

## 2019-03-19 MED ORDER — DOXYCYCLINE HYCLATE 100 MG PO TABS
100.0000 mg | ORAL_TABLET | Freq: Two times a day (BID) | ORAL | Status: DC
  Administered 2019-03-19 – 2019-03-23 (×7): 100 mg via ORAL
  Filled 2019-03-19 (×7): qty 1

## 2019-03-19 MED ORDER — SERTRALINE HCL 100 MG PO TABS
100.0000 mg | ORAL_TABLET | Freq: Every day | ORAL | Status: DC
  Administered 2019-03-19 – 2019-03-22 (×4): 100 mg via ORAL
  Filled 2019-03-19 (×4): qty 1

## 2019-03-19 MED ORDER — PREDNISONE 5 MG PO TABS
5.0000 mg | ORAL_TABLET | Freq: Every day | ORAL | Status: DC
  Administered 2019-03-19 – 2019-03-22 (×4): 5 mg via ORAL
  Filled 2019-03-19 (×4): qty 1

## 2019-03-19 MED ORDER — MORPHINE SULFATE (PF) 4 MG/ML IV SOLN
4.0000 mg | INTRAVENOUS | Status: DC | PRN
  Administered 2019-03-21 (×2): 4 mg via INTRAVENOUS
  Filled 2019-03-19 (×2): qty 1

## 2019-03-19 MED ORDER — ADULT MULTIVITAMIN W/MINERALS CH
1.0000 | ORAL_TABLET | Freq: Every day | ORAL | Status: DC
  Administered 2019-03-21 – 2019-03-23 (×3): 1 via ORAL
  Filled 2019-03-19 (×3): qty 1

## 2019-03-19 MED ORDER — ACETAMINOPHEN 325 MG PO TABS: 650.0000 mg | ORAL_TABLET | Freq: Four times a day (QID) | ORAL | Status: DC | PRN

## 2019-03-19 MED ORDER — PIPERACILLIN-TAZOBACTAM 3.375 G IVPB 30 MIN
3.3750 g | Freq: Once | INTRAVENOUS | Status: AC
  Administered 2019-03-19: 05:00:00 3.375 g via INTRAVENOUS
  Filled 2019-03-19: qty 50

## 2019-03-19 MED ORDER — SODIUM CHLORIDE (PF) 0.9 % IJ SOLN
INTRAMUSCULAR | Status: AC
  Filled 2019-03-19: qty 50

## 2019-03-19 MED ORDER — MORPHINE SULFATE (PF) 4 MG/ML IV SOLN
4.0000 mg | Freq: Once | INTRAVENOUS | Status: AC
  Administered 2019-03-19: 02:00:00 4 mg via INTRAVENOUS
  Filled 2019-03-19: qty 1

## 2019-03-19 MED ORDER — OMEGA-3-ACID ETHYL ESTERS 1 G PO CAPS
1.0000 g | ORAL_CAPSULE | Freq: Every day | ORAL | Status: DC
  Administered 2019-03-21 – 2019-03-23 (×3): 1 g via ORAL
  Filled 2019-03-19 (×3): qty 1

## 2019-03-19 MED ORDER — FENTANYL CITRATE (PF) 100 MCG/2ML IJ SOLN
50.0000 ug | Freq: Once | INTRAMUSCULAR | Status: AC
  Administered 2019-03-19: 02:00:00 50 ug via INTRAVENOUS
  Filled 2019-03-19: qty 2

## 2019-03-19 MED ORDER — IOHEXOL 350 MG/ML SOLN
100.0000 mL | Freq: Once | INTRAVENOUS | Status: AC | PRN
  Administered 2019-03-19: 03:00:00 80 mL via INTRAVENOUS

## 2019-03-19 MED ORDER — SODIUM CHLORIDE 0.9 % IV SOLN
INTRAVENOUS | Status: DC
  Administered 2019-03-19 – 2019-03-22 (×4): via INTRAVENOUS

## 2019-03-19 MED ORDER — ATORVASTATIN CALCIUM 40 MG PO TABS
40.0000 mg | ORAL_TABLET | Freq: Every day | ORAL | Status: DC
  Administered 2019-03-21 – 2019-03-23 (×3): 40 mg via ORAL
  Filled 2019-03-19 (×3): qty 1

## 2019-03-19 MED ORDER — MECLIZINE HCL 25 MG PO TABS: 25.0000 mg | ORAL_TABLET | Freq: Two times a day (BID) | ORAL | Status: DC | PRN

## 2019-03-19 MED ORDER — LEVOTHYROXINE SODIUM 112 MCG PO TABS
112.0000 ug | ORAL_TABLET | Freq: Every day | ORAL | Status: DC
  Administered 2019-03-20 – 2019-03-23 (×4): 112 ug via ORAL
  Filled 2019-03-19 (×4): qty 1

## 2019-03-19 MED ORDER — MORPHINE SULFATE (PF) 2 MG/ML IV SOLN: 2.0000 mg | INTRAVENOUS | Status: DC | PRN

## 2019-03-19 MED ORDER — MORPHINE SULFATE (PF) 4 MG/ML IV SOLN
3.0000 mg | Freq: Once | INTRAVENOUS | Status: AC
  Administered 2019-03-19: 14:00:00 3 mg via INTRAVENOUS
  Filled 2019-03-19: qty 1

## 2019-03-19 MED ORDER — OXYCODONE HCL 5 MG PO TABS
10.0000 mg | ORAL_TABLET | Freq: Four times a day (QID) | ORAL | Status: DC | PRN
  Administered 2019-03-19 – 2019-03-23 (×9): 10 mg via ORAL
  Filled 2019-03-19 (×9): qty 2

## 2019-03-19 MED ORDER — ONDANSETRON HCL 4 MG PO TABS: 4.0000 mg | ORAL_TABLET | Freq: Four times a day (QID) | ORAL | Status: DC | PRN

## 2019-03-19 MED ORDER — MIRTAZAPINE 15 MG PO TABS
7.5000 mg | ORAL_TABLET | Freq: Every day | ORAL | Status: DC
  Administered 2019-03-19 – 2019-03-22 (×4): 7.5 mg via ORAL
  Filled 2019-03-19 (×4): qty 1

## 2019-03-19 MED ORDER — PIPERACILLIN-TAZOBACTAM 3.375 G IVPB
3.3750 g | Freq: Three times a day (TID) | INTRAVENOUS | Status: DC
  Administered 2019-03-19 – 2019-03-23 (×12): 3.375 g via INTRAVENOUS
  Filled 2019-03-19 (×12): qty 50

## 2019-03-19 MED ORDER — ONDANSETRON HCL 4 MG/2ML IJ SOLN
4.0000 mg | Freq: Once | INTRAMUSCULAR | Status: AC
  Administered 2019-03-19: 02:00:00 4 mg via INTRAVENOUS
  Filled 2019-03-19: qty 2

## 2019-03-19 NOTE — H&P (View-Only) (Signed)
Surgical Consultation Requesting provider: Dr. Lupita Leash  CC: back pain  HPI: Very nice 78yo man on xarelto (last dose Sunday evening) and with multiple severe comorbidities as listed below who developed severe mid back pain (he has diffuse arthritis and chronically has pain but this was much worse) and one episode of diarrhea about an hour after eating [employer] grilled chicken for dinner last night. He then developed some upper stomach pain and felt he had food poisoning so tried to vomit which he was able to do. The pain persisted and so he called EMS. His workup in the ER as noted below, and surgery is consulted regarding possible cholecystectomy.   The patient is very reluctant about having to undergo any surgery. Denies any prior abdominal operations.   Allergies  Allergen Reactions  . Bee Venom Swelling  . Cellcept [Mycophenolate Mofetil] Other (See Comments)    Reaction unknown  . Colcrys [Colchicine] Other (See Comments)    myalgias  . Erythromycin Other (See Comments)    Reaction unknown  . Methotrexate Derivatives Hives, Itching and Rash    Only in tablet form  . Neosporin [Neomycin-Bacitracin Zn-Polymyx] Rash  . Trazodone Palpitations    Weakness, loss of appetite  . Ultram [Tramadol] Rash    Past Medical History:  Diagnosis Date  . Anemia    "chronic anemic"  . Anginal pain ([place])   . Anxiety   . 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 Cancer ([place])    pre-cancerous lesions removed 11/22/12 and hx of  . 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 Chronic back pain    hx of  . Chronic kidney disease    renal insufficiency, [place] kidney  . Coronary artery disease    stent placement 2000, does not see cardiologist at this time  . Dyslipidemia   . Full dentures    Edentulous since age [age]  . GERD (gastroesophageal reflux disease)   . Gout    pseudo gout  . Hearing impairment    job related, hbilat hearing aids  .  Hepatitis    hepatitis "C"  . History of radiation therapy 03/11/2013-04/23/2013   right retromolar trigone/54Gy/39fx  . Hypertension    sees Dr. Deland Pretty  . Hypertension   . Hypothyroid   . Insomnia    cpap  . Rheumatoid arthritis ([place])    "all over" (09/13/2016)  . Sleep apnea    "tried mask 3 different times; couldn't tolerate it" (09/13/2016)  . Thyroid disease    Hypothyroidism    Past Surgical History:  Procedure Laterality Date  . CARDIAC CATHETERIZATION N/A 09/13/2016   Procedure: Left Heart Cath and Coronary Angiography;  Surgeon: Adrian Prows, MD;  Location: [facility];  Service: Cardiovascular;  Laterality: N/A;  . CARDIAC CATHETERIZATION N/A 10/11/2016   Procedure: Coronary Stent Intervention;  Surgeon: Adrian Prows, MD;  Location: [facility];  Service: Cardiovascular;  Laterality: N/A;  LAD & 1st Diag  . CATARACT EXTRACTION W/ INTRAOCULAR LENS  IMPLANT, BILATERAL Bilateral 2007  . CORONARY ANGIOPLASTY  09/13/2016  . CORONARY ANGIOPLASTY WITH STENT PLACEMENT  2002  . FRACTURE SURGERY    . HIP PINNING,CANNULATED Left 12/22/2012   Procedure: CANNULATED HIP PINNING;  Surgeon: Mcarthur Rossetti, MD;  Location: [facility];  Service: Orthopedics;  Laterality: Left;  [name] MULTIPLE TOOTH EXTRACTIONS  ~ 1962   Edentulous  . RADICAL NECK DISSECTION Right 01/22/2013  [facility]  . SKIN CANCER EXCISION     "face, ears"  . SKIN GRAFT Left ~ 1961   "grafted skin off my stomach & put it on left hand that I had caught in fan belt"  . TONSILLECTOMY      Family History  Problem Relation Age of Onset  . Coronary artery disease Brother 78  . Heart attack Brother   . Coronary artery disease Father        stents in 70's  . Cancer Mother        bone    Social History   Socioeconomic History  . Marital status: Widowed    Spouse name: Not on file  . Number of children: 2  . Years of education: Not on file  . Highest education level: Not on file  Occupational History   . Occupation: Retired    Fish farm manager: RETIRED    Comment: Engineer, building services  Social Needs  . Financial resource strain: Not on file  . Food insecurity:    Worry: Not on file    Inability: Not on file  . Transportation needs:    Medical: Not on file    Non-medical: Not on file  Tobacco Use  . Smoking status: Former Smoker    Packs/day: 1.00    Years: 27.00    Pack years: 27.00    Types: Cigarettes    Last attempt to quit: 11/08/1983    Years since quitting: 35.3  . Smokeless tobacco: Never Used  Substance and Sexual Activity  . Alcohol use: Yes    Comment: 09/13/2016 "nothing since 12/2012"  . Drug use: No  . Sexual activity: Not on file  Lifestyle  . Physical activity:    Days per week: Not on file    Minutes per session: Not on file  . Stress: Not on file  Relationships  . Social connections:    Talks on phone: Not on file    Gets together: Not on file    Attends religious service: Not on file    Active member of club or organization: Not on file    Attends meetings of clubs or organizations: Not on file    Relationship status: Not on file  Other Topics Concern  . Not on file  Social History Narrative  . Not on file    No current facility-administered medications on file prior to encounter.    Current Outpatient Medications on File Prior to Encounter  Medication Sig Dispense Refill  . acyclovir (ZOVIRAX) 400 MG tablet Take 400 mg by mouth 2 (two) times daily.     [name] aspirin EC 81 MG tablet Take 81 mg by mouth daily.    [name] atorvastatin (LIPITOR) 40 MG tablet Take 40 mg by mouth 2 (two) times daily.     . Calcium Carbonate-Vitamin D (CALCIUM-D) 600-400 MG-UNIT TABS Take 1 tablet by mouth daily.     [name] EPINEPHrine 0.3 mg/0.3 mL IJ SOAJ injection Inject 0.3 mg into the muscle as directed.     . magnesium oxide (MAG-OX) 400 MG tablet Take 400 mg by mouth at bedtime.    . meclizine (ANTIVERT) 25 MG tablet Take 25 mg by mouth every 12 (twelve) hours as needed for dizziness.      . mirtazapine (REMERON) 15 MG tablet Take 7.5 mg by mouth at bedtime.     . Multiple Vitamin (MULTIVITAMIN WITH MINERALS) TABS tablet Take 1 tablet by mouth daily.    [name] omega-3 acid ethyl esters (  LOVAZA) 1 G capsule Take 1 g by mouth daily.     [name] oxyCODONE (ROXICODONE) 15 MG immediate release tablet Take 15 mg by mouth 4 (four) times daily as needed for pain.     . pantoprazole (PROTONIX) 40 MG tablet Take 40 mg by mouth 2 (two) times daily.    Vladimir Faster Glycol-Propyl Glycol (SYSTANE) 0.4-0.3 % GEL ophthalmic gel Place 1 application into both eyes as needed (dry eyes).    . predniSONE (DELTASONE) 5 MG tablet Take 5 mg by mouth at bedtime.     . propranolol (INDERAL) 10 MG tablet Take 10 mg by mouth 2 (two) times daily.    . sertraline (ZOLOFT) 100 MG tablet Take 100 mg by mouth at bedtime.     Alveda Reasons 10 MG TABS tablet Take 10 mg by mouth daily.    . clobetasol (TEMOVATE) 0.05 % external solution Apply 1 application topically as needed. 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 diazepam (VALIUM) 5 MG tablet Take 1 tablet (5 mg total) by mouth every 12 (twelve) hours as needed for anxiety. For anxiety (Patient taking differently: Take 5 mg by mouth at bedtime as needed (sleep). For anxiety) 30 tablet 0  . hydrALAZINE (APRESOLINE) 50 MG tablet Take 1 tablet (50 mg total) by mouth 3 (three) times daily. (Patient not taking: Reported on 03/19/2019) 90 tablet 1  . levothyroxine (SYNTHROID, LEVOTHROID) 112 MCG tablet Take 112 mcg daily before breakfast by mouth.    [name] lisinopril (ZESTRIL) 5 MG tablet Take 5 mg by mouth daily.    . Rivaroxaban (XARELTO) 15 MG TABS tablet Take 1 tablet (15 mg total) by mouth daily with supper. (Patient not taking: Reported on 03/19/2019) 30 tablet 6    Review of Systems: a complete, 10pt review of systems was completed with pertinent positives and negatives as documented in the HPI  Physical Exam: Vitals:   03/19/19 0400 03/19/19 0618  BP: (!) 157/88 (!) 158/107  Pulse: 69 75   Resp:  15  Temp:    SpO2: 94% 100%   Gen: A&Ox3, no distress.  Head: normocephalic, atraumatic. Edentulous. Hearing aids.  Eyes: extraocular motions intact, anicteric.  Neck: supple without mass or thyromegaly Chest: unlabored respirations, symmetrical air entry Cardiovascular: RRR Abdomen: soft, nondistended, nontender. No mass or organomegaly. Deep palpation of the RUQ elicits no tenderness whatsoever.  Extremities: warm, without edema, no deformities  Neuro: grossly intact Psych: appropriate mood and affect, normal insight  Skin: warm and dry   CBC Latest Ref Rng & Units 03/19/2019 10/12/2016 09/14/2016  WBC 4.0 - 10.5 K/uL 6.6 6.1 6.5  Hemoglobin 13.0 - 17.0 g/dL 12.1(L) 12.2(L) 12.8(L)  Hematocrit 39.0 - 52.0 % 38.2(L) 36.3(L) 39.0  Platelets 150 - 400 K/uL 226 171 169    CMP Latest Ref Rng & Units 03/19/2019 10/12/2016 10/11/2016  Glucose 70 - 99 mg/dL 158(H) 106(H) 95  BUN 8 - 23 mg/dL 14 12 12   Creatinine 0.61 - 1.24 mg/dL 1.49(H) 1.77(H) 1.79(H)  Sodium 135 - 145 mmol/L 138 139 140  Potassium 3.5 - 5.1 mmol/L 3.7 3.5 3.5  Chloride 98 - 111 mmol/L 102 104 104  CO2 22 - 32 mmol/L 27 26 29   Calcium 8.9 - 10.3 mg/dL 9.1 8.6(L) 9.2  Total Protein 6.5 - 8.1 g/dL 6.8 - -  Total Bilirubin 0.3 - 1.2 mg/dL 0.5 - -  Alkaline Phos 38 - 126 U/L 89 - -  AST 15 - 41 U/L 39 - -  ALT  0 - 44 U/L 20 - -    Lab Results  Component Value Date   INR 1.1 03/19/2019   INR 1.05 09/13/2016   INR 1.18 03/26/2013    Imaging: Dg Chest Port 1 View  Result Date: 03/19/2019 CLINICAL DATA:  Nausea and vomiting with chest pain EXAM: PORTABLE CHEST 1 VIEW COMPARISON:  Chest CT 04/21/2015 FINDINGS: The heart size and mediastinal contours are within normal limits. Both lungs are clear. The visualized skeletal structures are unremarkable. IMPRESSION: No active disease. Electronically Signed   By: Ulyses Jarred M.D.   On: 03/19/2019 01:17   Ct Angio Chest/abd/pel For Dissection W And/or Wo  Contrast  Result Date: 03/19/2019 CLINICAL DATA:  78 year old male with abdominal pain and vomiting. Concern for aortic dissection. History of squamous cell carcinoma of the head and neck. EXAM: CT ANGIOGRAPHY CHEST, ABDOMEN AND PELVIS TECHNIQUE: Multidetector CT imaging through the chest, abdomen and pelvis was performed using the standard protocol during bolus administration of intravenous contrast. Multiplanar reconstructed images and MIPs were obtained and reviewed to evaluate the vascular anatomy. CONTRAST:  38mL OMNIPAQUE IOHEXOL 350 MG/ML SOLN COMPARISON:  Chest radiograph dated 03/19/2019 and CT dated 03/21/2015 FINDINGS: CTA CHEST FINDINGS Cardiovascular: There is no cardiomegaly or pericardial effusion. Advanced coronary vascular calcification of the LAD. Minimal atherosclerotic calcification of the thoracic aorta. No aneurysmal dilatation or dissection. The origins of the great vessels of the aortic arch appear patent. No pulmonary artery embolus identified. Mediastinum/Nodes: Right hilar calcified lymph nodes. No mediastinal adenopathy. There is an area of dilatation of the upper esophagus which may represent a diverticulum. No mediastinal fluid collection. Lungs/Pleura: There is a cluster of nodular density in the right upper lobe concerning for an infectious process such as atypical infection. There is an 8 mm nodular density in the right lower lobe (series 9 image 99) which may represent inflammatory or infectious in etiology although metastatic disease is not entirely excluded. Clinical correlation and follow-up recommended. There are bibasilar dependent atelectatic changes and scarring. No lobar consolidation, pleural effusion, or pneumothorax. The central airways are patent. Musculoskeletal: There is osteopenia with degenerative changes of the spine. No acute osseous pathology. Old right posterior 12 rib fracture. Review of the MIP images confirms the above findings. CTA ABDOMEN AND PELVIS  FINDINGS VASCULAR Aorta: Mild atherosclerotic disease. No aneurysmal dilatation or dissection. The aorta is mildly tortuous. Celiac: Patent without evidence of aneurysm, dissection, vasculitis or significant stenosis. SMA: Atherosclerotic calcification. The SMA is patent. Renals: There is atherosclerotic calcification of the renal ostia. The renal arteries remain patent. IMA: Patent without evidence of aneurysm, dissection, vasculitis or significant stenosis. 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 The iliac arteries are otherwise patent. Veins: No obvious venous abnormality within the limitations of this arterial phase study. No portal venous gas. Review of the MIP images confirms the above findings. NON-VASCULAR No intra-abdominal free air or free fluid. Hepatobiliary: Apparent fatty infiltration of the liver. No intrahepatic biliary ductal dilatation. There is a calcified stone within the gallbladder. The gallbladder is distended. A stone is noted at the gallbladder neck. There is inflammatory changes and haziness of the gallbladder wall. Findings most consistent with an acute cholecystitis. Further evaluation with ultrasound recommended. Pancreas: Unremarkable. No pancreatic ductal dilatation or surrounding inflammatory changes. Spleen: Normal in size without focal abnormality. Adrenals/Urinary Tract: The adrenal glands are unremarkable. Mild bilateral renal parenchyma atrophy with areas of cortical scarring in the inferior pole  of the right kidney. There is no hydronephrosis on either side. The visualized ureters and urinary bladder appear unremarkable. Stomach/Bowel: There is sigmoid diverticulosis without active inflammatory changes. No bowel obstruction or active inflammation. Normal appendix. Lymphatic: No adenopathy. Reproductive: Dystrophic calcification of the prostate gland. The seminal vesicles are symmetric. Other: None  Musculoskeletal: Osteopenia. No acute osseous pathology. Left femoral neck fixation screws. Review of the MIP images confirms the above findings. IMPRESSION: 1. No aortic aneurysm or dissection. No pulmonary artery embolus identified. 2. Cholelithiasis with findings of acute cholecystitis. Further evaluation with ultrasound recommended. 3. Cluster of nodular densities in the right upper lobe concerning for an infectious process such as atypical pneumonia. Clinical correlation and follow-up recommended. 4. An 8 mm right lower lobe nodular density is also likely infectious in etiology. Non-contrast chest CT at 6-12 months is recommended. If the nodule is stable at time of repeat CT, then future CT at 18-24 months (from today's scan) is considered optional for low-risk patients, but is recommended for high-risk patients. This recommendation follows the consensus statement: Guidelines for Management of Incidental Pulmonary Nodules Detected on CT Images: From the Fleischner Society 2017; Radiology 2017; 284:228-243. 5. 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 6. Sigmoid diverticulosis. No bowel obstruction or active inflammation. Normal appendix. Electronically Signed   By: Anner Crete M.D.   On: 03/19/2019 03:54   US Abdomen Limited Ruq  Result Date: 03/19/2019 CLINICAL DATA:  78 year old male with right upper quadrant pain and suspicion of cholelithiasis and cholecystitis on CTA Chest, Abdomen, and Pelvis earlier today EXAM: ULTRASOUND ABDOMEN LIMITED RIGHT UPPER QUADRANT COMPARISON:  CTA Chest, Abdomen, and Pelvis earlier today. FINDINGS: Gallbladder: On some images the gallbladder wall appears normal, but on image 6 there is thickening at the fundus of 5-6 millimeters. Echogenic partially shadowing cholelithiasis identified (image 11). There is also dependent sludge within the gallbladder. Gallstones size estimated at 8 millimeters. No sonographic Murphy sign  elicited. Common bile duct: Diameter: 5 millimeters, normal. Liver: Echogenic liver (image 22). No discrete liver lesion. No intrahepatic biliary ductal dilatation. Portal vein is patent on color Doppler imaging with normal direction of blood flow towards the liver. Other findings: Negative visible right kidney. IMPRESSION: 1. Focal gallbladder wall thickening with sludge and gallstones. Together with the earlier CTA appearance this is suspicious for Acute Cholecystitis. 2. CBD remains within normal limits.  Fatty liver disease. Electronically Signed   By: Genevie Ann M.D.   On: 03/19/2019 05:12      A/P:  Patient Active Problem List   Diagnosis Date Noted  . Acute cholecystitis 03/19/2019  . Post PTCA 09/13/2016  . Exposed mandible 05/20/2014  . . 03/26/2013  . CAP (community acquired pneumonia) 03/26/2013  . COPD with acute exacerbation ([place]) 03/26/2013  . Pulmonary nodule 01/06/2013  . Diastolic dysfunction, with good LVF 2D Feb 2014 12/25/2012  . Chronic anticoagulation, new Feb 2014- Xarelto 12/25/2012  . PAF, new Feb 2014 with SSS component 12/24/2012  . 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 Hip fracture, left, S/P surgical repair 12/22/12 12/21/2012  . Coronary artery disease   . HTN (hypertension)   . Anxiety   . Sleep apnea   . Chronic kidney disease. stage 2   . GERD (gastroesophageal reflux disease)   . RA (rheumatoid arthritis) ([place])   . Gout   . Anemia   . Hepatitis   . Chronic back pain   . Dyslipidemia   .  Hypothyroid   . CAD, LAD stent 2002      78yo man with multiple medical problems as listed above.  Back pain, emesis and diarrhea x 1. Labs normal. Exam is not consistent with cholecystitis and imaging is equivocal.  -HIDA ordered. If positive, will need cardiac clearance prior to surgery.    Romana Juniper, MD [facility], [place] Pager [phone]

## 2019-03-19 NOTE — ED Provider Notes (Addendum)
TIME SEEN: 12:32 AM  CHIEF COMPLAINT: Cough, vomiting, diarrhea, abdominal pain  HPI: Patient is a 78 year old male with history of rheumatoid arthritis on prednisone, hypothyroidism, hypertension, hepatitis C, CAD s/p stent, a fib on Xarelto who presents to the emergency department with concerns for "food poisoning".  States tonight after eating dinner he had vomiting, diarrhea and diffuse abdominal pain and lower back pain.  No injury to his back.  No numbness, tingling or weakness.  No bowel or bladder incontinence.  No previous abdominal surgery.  No sick contacts.  States he has been isolating at home but still goes to the store as he lives at home alone.  He is concerned about coronavirus and states that he has had a cough with white sputum production for the past month.  He denies chest pain or shortness of breath.  No known fevers or chills. He denies dysuria or hematuria.  Patient came in by EMS.   ROS: See HPI Constitutional: no fever  Eyes: no drainage  ENT: no runny nose   Cardiovascular:  no chest pain  Resp: no SOB  GI: Vomiting and diarrhea GU: no dysuria Integumentary: no rash  Allergy: no hives  Musculoskeletal: no leg swelling  Neurological: no slurred speech ROS otherwise negative  PAST MEDICAL HISTORY/PAST SURGICAL HISTORY:  Past Medical History:  Diagnosis Date  . Anemia    "chronic anemic"  . Anginal pain ([place])   . Anxiety   . 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 Cancer ([place])    pre-cancerous lesions removed 11/22/12 and hx of  . 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 Chronic back pain    hx of  . Chronic kidney disease    renal insufficiency, [place] kidney  . Coronary artery disease    stent placement 2000, does not see cardiologist at this time  . Dyslipidemia   . Full dentures    Edentulous since age [age]  . GERD (gastroesophageal reflux disease)   . Gout    pseudo gout  . Hearing impairment     job related, hbilat hearing aids  . Hepatitis    hepatitis "C"  . History of radiation therapy 03/11/2013-04/23/2013   right retromolar trigone/54Gy/60fx  . Hypertension    sees Dr. Deland Pretty  . Hypertension   . Hypothyroid   . Insomnia    cpap  . Rheumatoid arthritis ([place])    "all over" (09/13/2016)  . Sleep apnea    "tried mask 3 different times; couldn't tolerate it" (09/13/2016)  . Thyroid disease    Hypothyroidism    MEDICATIONS:  Prior to Admission medications   Medication Sig Start Date End Date Taking? Authorizing Provider  acyclovir (ZOVIRAX) 400 MG tablet Take 400 mg by mouth daily.  03/01/13   [provider]  amLODipine (NORVASC) 5 MG tablet Take 5 mg by mouth every evening.  09/09/16   [provider]  aspirin EC 81 MG tablet Take 81 mg by mouth daily.    [provider]  atenolol (TENORMIN) 50 MG tablet Take 50 mg by mouth at bedtime.     [provider]  atorvastatin (LIPITOR) 40 MG tablet Take 40 mg by mouth every evening.  09/09/16   [provider]  Calcium Carbonate-Vitamin D (CALCIUM-D) 600-400 MG-UNIT TABS Take 1 mg by mouth daily.     [provider]  cetirizine (ZYRTEC) 10 MG tablet Take 10 mg by mouth  daily.     [provider]  diazepam (VALIUM) 5 MG tablet Take 1 tablet (5 mg total) by mouth every 12 (twelve) hours as needed for anxiety. For anxiety Patient taking differently: Take 5 mg by mouth at bedtime as needed (sleep). For anxiety 12/25/12   Barton Dubois, MD  EPINEPHrine 0.3 mg/0.3 mL IJ SOAJ injection Inject 0.3 mg into the muscle as directed.  05/01/14   [provider]  Glucosamine-Chondroit-Vit C-Mn (GLUCOSAMINE 1500 COMPLEX PO) Take 1 capsule by mouth daily.    [provider]  hydrALAZINE (APRESOLINE) 50 MG tablet Take 1 tablet (50 mg total) by mouth 3 (three) times daily. 10/12/16   Neldon Labella, NP  levothyroxine (SYNTHROID, LEVOTHROID) 112 MCG tablet Take 112  mcg daily before breakfast by mouth.    [provider]  magnesium oxide (MAG-OX) 400 MG tablet Take 400 mg by mouth at bedtime.    [provider]  meclizine (ANTIVERT) 25 MG tablet Take 25 mg by mouth every 12 (twelve) hours as needed for dizziness.     [provider]  mirtazapine (REMERON) 15 MG tablet Take 15 mg by mouth at bedtime.    [provider]  nitroGLYCERIN (NITRODUR - DOSED IN MG/24 HR) 0.1 mg/hr patch Place 0.1 mg onto the skin daily.  09/07/16   [provider]  omega-3 acid ethyl esters (LOVAZA) 1 G capsule Take 1 g by mouth at bedtime.     [provider]  omeprazole (PRILOSEC) 20 MG capsule Take 20 mg by mouth at bedtime.  11/19/13   [provider]  oxyCODONE (ROXICODONE) 15 MG immediate release tablet Take 15 mg by mouth every 4 (four) hours as needed for pain.     [provider]  polyethylene glycol (MIRALAX / GLYCOLAX) packet Take 17 g by mouth daily as needed for mild constipation.  09/11/08   [provider]  predniSONE (DELTASONE) 5 MG tablet Take 5 mg by mouth at bedtime.     [provider]  Rivaroxaban (XARELTO) 15 MG TABS tablet Take 1 tablet (15 mg total) by mouth daily with supper. Patient taking differently: Take 15 mg every 3 (three) days by mouth.  09/14/16   Adrian Prows, MD  sertraline (ZOLOFT) 100 MG tablet Take 100 mg by mouth at bedtime.  11/20/12   [provider]  simvastatin (ZOCOR) 40 MG tablet Take 40 mg daily by mouth.    [provider]  triamcinolone cream (KENALOG) 0.1 % Apply 1 application topically 2 (two) times daily.     [provider]  zaleplon (SONATA) 10 MG capsule Take 10 mg by mouth at bedtime. 08/25/16   [provider]    ALLERGIES:  Allergies  Allergen Reactions  . Bee Venom Swelling  . Cellcept [Mycophenolate Mofetil] Other (See Comments)    Reaction unknown  . Colcrys [Colchicine] Other (See Comments)     myalgias  . Erythromycin Other (See Comments)    Reaction unknown  . Methotrexate Derivatives Hives, Itching and Rash    Only in tablet form  . Neosporin [Neomycin-Bacitracin Zn-Polymyx] Rash  . Trazodone Palpitations    Weakness, loss of appetite  . Ultram [Tramadol] Rash    SOCIAL HISTORY:  Social History   Tobacco Use  . Smoking status: Former Smoker    Packs/day: 1.00    Years: 27.00    Pack years: 27.00    Types: Cigarettes    Last attempt to quit: 11/08/1983    Years  since quitting: 35.3  . Smokeless tobacco: Never Used  Substance Use Topics  . Alcohol use: Yes    Comment: 09/13/2016 "nothing since 12/2012"    FAMILY HISTORY: Family History  Problem Relation Age of Onset  . Coronary artery disease Brother 80  . Heart attack Brother   . Coronary artery disease Father        stents in 70's  . Cancer Mother        bone    EXAM: BP (!) 172/92 (BP Location: Right Arm)   Pulse 65   Temp 98.1 F (36.7 C) (Oral)   Resp 17   Ht 6\' 3"  (1.905 m)   Wt 77.1 kg   SpO2 100%   BMI 21.25 kg/m  CONSTITUTIONAL: Alert and oriented and responds appropriately to questions.  Elderly, in no distress HEAD: Normocephalic EYES: Conjunctivae clear, pupils appear equal, EOMI ENT: normal nose; moist mucous membranes NECK: Supple, no meningismus, no nuchal rigidity, no LAD  CARD: RRR; S1 and S2 appreciated; no murmurs, no clicks, no rubs, no gallops RESP: Normal chest excursion without splinting or tachypnea; breath sounds clear and equal bilaterally; no wheezes, no rhonchi, no rales, no hypoxia or respiratory distress, speaking full sentences ABD/GI: Normal bowel sounds; non-distended; soft, diffusely tender throughout the abdomen, no rebound, no guarding, no peritoneal signs, no hepatosplenomegaly BACK:  The back appears normal and is non-tender to palpation, there is no CVA tenderness, no midline spinal tenderness or step-off or deformity EXT: Normal ROM in all joints; non-tender to  palpation; no edema; normal capillary refill; no cyanosis, no calf tenderness or swelling    SKIN: Normal color for age and race; warm; no rash NEURO: Moves all extremities equally, normal sensation diffusely, no saddle anesthesia, no facial asymmetry, normal speech PSYCH: The patient's mood and manner are appropriate. Grooming and personal hygiene are appropriate.  MEDICAL DECISION MAKING: Patient here with complaints of nausea, vomiting, diarrhea and abdominal pain that started tonight.  Also has had a productive cough for the past month.  He is concerned about coronavirus.  He has not had any known sick contacts.  On exam, patient is diffusely tender throughout his abdomen.  Differential includes gastroenteritis, food poisoning, colitis, diverticulitis, bowel obstruction, appendicitis, cholecystitis, pancreatitis, UTI.  Will obtain labs, urine and a CT of the abdomen pelvis.  Will give IV fluids, pain and nausea medicine.  He also reports a cough for the past month.  Will obtain chest x-ray and obtain coronavirus screening as he is immunocompromised and may require admission depending on results of patient's work-up.  ED PROGRESS: Patient continues to complain of severe pack pain without any tenderness on exam.  This makes me more concerned for dissection, aneurysm.  Kidney stone also on the differential.  Labs are unremarkable other than creatinine of 1.49 which appears chronic.  LFTs, lipase normal.  Chest x-ray clear.  Will obtain a CT of the chest, abdomen and pelvis rather than just a CT of the abdomen pelvis to evaluate patient's aorta.  No significant improvement in symptoms after fentanyl.  Will give morphine.   3:15 AM  Pt's pain improved with IV morphine.  Urine shows no sign of infection or blood.  CT scan pending.  Coronavirus screening negative.  4:10 AM  Pt's CT showed no dissection or aneurysm but was concerning for acute cholecystitis and right upper lobe atypical pneumonia.  Discussed  with pharmacist who recommended Zosyn and doxycycline for coverage.  Will obtain blood cultures.  Will also  obtain right upper quadrant ultrasound.  Patient reports his pain and nausea have resolved at this time.  5:15 AM  Pt's ultrasound also concerning for acute cholecystitis.  Will discuss with general surgery.  Anticipate medical admission given patient has multiple comorbidities, is on Xarelto and also has pneumonia.  5:30 AM  D/w Dr. Ninfa Linden with general surgery.  Appreciate his help.  He agrees that patient likely a poor surgical candidate and will likely need percutaneous cholecystostomy tube rather than cholecystectomy.  Also agrees with medical admission.  Surgery will see in consultation in the morning.  5:42 AM  Pt updated with his results.  He states he is feeling much better and is convinced that this is "food poisoning".  Discussed with him that his abdominal pain and vomiting are secondary to acute cholecystitis and that he will need antibiotics and a percutaneous drain or symptoms could worsen and he could become septic and die.  He also states that he does not think that he has pneumonia despite his imaging and despite the fact that he has had a cough for the past month with sputum production.  He states that the reason he wants to go home is because he has a hard time sleeping at night and the only thing that has ever helped him to sleep is to drink 1 mixed drink of alcohol before bed.  I have offered him medications here to help with insomnia.  We have discussed at length risk and benefits of hospitalization, treatment and leaving [facility].  He agrees to stay at this time for admission and to see surgery in the morning.  5:52 AM Discussed patient's case with hospitalist, Dr. Alcario Drought.  I have recommended admission and patient (and family if present) agree with this plan. Admitting physician will place admission orders.   I reviewed all nursing notes, vitals, pertinent  previous records, EKGs, lab and urine results, imaging (as available).    CRITICAL CARE Performed by: Pryor Curia   Total critical care time: 65 minutes  Critical care time was exclusive of separately billable procedures and treating other patients.  Critical care was necessary to treat or prevent imminent or life-threatening deterioration.  Critical care was time spent personally by me on the following activities: development of treatment plan with patient and/or surrogate as well as nursing, discussions with consultants, evaluation of patient's response to treatment, examination of patient, obtaining history from patient or surrogate, ordering and performing treatments and interventions, ordering and review of laboratory studies, ordering and review of radiographic studies, pulse oximetry and re-evaluation of patient's condition.       EKG Interpretation  Date/Time:  Tuesday Mar 19 2019 05:54:25 EDT Ventricular Rate:  59 PR Interval:    QRS Duration: 96 QT Interval:  464 QTC Calculation: 460 R Axis:   63 Text Interpretation:  Sinus rhythm No significant change since last tracing Confirmed by Jiovanna Frei, Cyril Mourning [phone]) on 03/19/2019 5:56:15 AM       [place] Canty, Delice Bison, DO 03/19/19 [place], Delice Bison, DO 03/19/19 [id number]

## 2019-03-19 NOTE — Progress Notes (Signed)
Pharmacy Antibiotic Note  Gary [name] is a 78 y.o. male admitted on 03/19/2019 with intra-abdominal infection.  Pharmacy has been consulted for zosyn dosing.  Plan: Zosyn 3.375g IV q8h (4 hour infusion).  F/u scr/cultures  Height: 6\' 3"  (190.5 cm) Weight: 170 lb (77.1 kg) IBW/kg (Calculated) : 84.5  Temp (24hrs), Avg:98.1 F (36.7 C), Min:98.1 F (36.7 C), Max:98.1 F (36.7 C)  Recent Labs  Lab 03/19/19 0043  WBC 6.6  CREATININE 1.49*    Estimated Creatinine Clearance: 44.6 mL/min (A) (by C-G formula based on SCr of 1.49 mg/dL (H)).    Allergies  Allergen Reactions  . Bee Venom Swelling  . Cellcept [Mycophenolate Mofetil] Other (See Comments)    Reaction unknown  . Colcrys [Colchicine] Other (See Comments)    myalgias  . Erythromycin Other (See Comments)    Reaction unknown  . Methotrexate Derivatives Hives, Itching and Rash    Only in tablet form  . Neosporin [Neomycin-Bacitracin Zn-Polymyx] Rash  . Trazodone Palpitations    Weakness, loss of appetite  . Ultram [Tramadol] Rash    Antimicrobials this admission: 5/12 zosyn >>    >>   Dose adjustments this admission:   Microbiology results:  BCx:  UCx:    Sputum:    MRSA PCR:   Thank you for allowing pharmacy to be a part of this patient's care.  Dorrene German 03/19/2019 6:01 AM

## 2019-03-19 NOTE — ED Notes (Signed)
Bed: WA21 Expected date:  Expected time:  Means of arrival:  Comments: 78 yo M/NVD

## 2019-03-19 NOTE — ED Notes (Signed)
Patient transported to CT 

## 2019-03-19 NOTE — Progress Notes (Signed)
PHARMACY NOTE -  [facility] has been assisting with dosing of Zosyn for IAI.  Dosage remains stable at 3.375g IV q8 hr and need for further dosage adjustment appears unlikely at present given renal function  Pharmacy will sign off, following peripherally for culture results or dose adjustments. Please reconsult if a change in clinical status warrants re-evaluation of dosage.  Reuel Boom, PharmD, BCPS [phone] 03/19/2019, 1:55 PM

## 2019-03-19 NOTE — H&P (Signed)
History and Physical    Gary [name] GEX:[id number] DOB: 06-11-1941 DOA: 03/19/2019  PCP: Deland Pretty, MD  Patient coming from: Home  I have personally briefly reviewed patient's old medical records in [place]  Chief Complaint: N/V/D, cough  HPI: Gary [name] is a 78 y.o. male with medical history significant of CAD s/p stent, PAF on Xarelto, RA on prednisone, HTN, HCV, head and neck CA in remission, CKD (looks like stage 3).  Patient presents to the ED at [facility] with c/o "food poisoning".  After eating dinner he had N/V/D and diffuse abd and lower back pain.  No dysuria, no bladder or bowel incontinence.  No sick contacts though he has had cough productive of whiteish sputum for the past 1 month.  Isolating at home but does go to grocery store still.  No fevers / chills.   ED Course: CTA chest/abd/pel is suspicious for acute cholecystitis, this seems to be confirmed on [place].  Also showing nodular densities in RUL and RLL that are suspected to be infectious.  WBC and LFTs are NL, no SIRS.  Patient put on zosyn / doxy for CAP and acute cholecystitis.  Gen surg will do formal consult on patient, though it sounds like they may be going the cholecystostomy tube route if anything.  Symptoms significantly improved in ED following nausea and pain medication treatment.   Review of Systems: As per HPI otherwise 10 point review of systems negative.   Past Medical History:  Diagnosis Date  . Anemia    "chronic anemic"  . Anginal pain ([place])   . Anxiety   . 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 Cancer ([place])    pre-cancerous lesions removed 11/22/12 and hx of  . 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 Chronic back pain    hx of  . Chronic kidney disease    renal insufficiency, [place] kidney  . Coronary artery disease    stent placement 2000, does not see cardiologist at this time  . Dyslipidemia   . Full dentures     Edentulous since age [age]  . GERD (gastroesophageal reflux disease)   . Gout    pseudo gout  . Hearing impairment    job related, hbilat hearing aids  . Hepatitis    hepatitis "C"  . History of radiation therapy 03/11/2013-04/23/2013   right retromolar trigone/54Gy/23fx  . Hypertension    sees Dr. Deland Pretty  . Hypertension   . Hypothyroid   . Insomnia    cpap  . Rheumatoid arthritis ([place])    "all over" (09/13/2016)  . Sleep apnea    "tried mask 3 different times; couldn't tolerate it" (09/13/2016)  . Thyroid disease    Hypothyroidism    Past Surgical History:  Procedure Laterality Date  . CARDIAC CATHETERIZATION N/A 09/13/2016   Procedure: Left Heart Cath and Coronary Angiography;  Surgeon: Adrian Prows, MD;  Location: [place] [facility];  Service: Cardiovascular;  Laterality: N/A;  . CARDIAC CATHETERIZATION N/A 10/11/2016   Procedure: Coronary Stent Intervention;  Surgeon: Adrian Prows, MD;  Location: [place] [facility];  Service: Cardiovascular;  Laterality: N/A;  LAD & 1st Diag  . CATARACT EXTRACTION W/ INTRAOCULAR LENS  IMPLANT, BILATERAL Bilateral 2007  . CORONARY ANGIOPLASTY  09/13/2016  . CORONARY ANGIOPLASTY WITH STENT PLACEMENT  2002  . FRACTURE SURGERY    . HIP PINNING,CANNULATED Left 12/22/2012   Procedure: CANNULATED HIP  PINNING;  Surgeon: Mcarthur Rossetti, MD;  Location: [place];  Service: Orthopedics;  Laterality: Left;  [name] MULTIPLE TOOTH EXTRACTIONS  ~ 1962   Edentulous  . RADICAL NECK DISSECTION Right 01/22/2013   [facility]  . SKIN CANCER EXCISION     "face, ears"  . SKIN GRAFT Left ~ 1961   "grafted skin off my stomach & put it on left hand that I had caught in fan belt"  . TONSILLECTOMY       reports that he quit smoking about 35 years ago. His smoking use included cigarettes. He has a 27.00 pack-year smoking history. He has never used smokeless tobacco. He reports current alcohol use. He reports that he does not use drugs.  Allergies  Allergen Reactions   . Bee Venom Swelling  . Cellcept [Mycophenolate Mofetil] Other (See Comments)    Reaction unknown  . Colcrys [Colchicine] Other (See Comments)    myalgias  . Erythromycin Other (See Comments)    Reaction unknown  . Methotrexate Derivatives Hives, Itching and Rash    Only in tablet form  . Neosporin [Neomycin-Bacitracin Zn-Polymyx] Rash  . Trazodone Palpitations    Weakness, loss of appetite  . Ultram [Tramadol] Rash    Family History  Problem Relation Age of Onset  . Coronary artery disease Brother 46  . Heart attack Brother   . Coronary artery disease Father        stents in 70's  . Cancer Mother        bone     Prior to Admission medications   Medication Sig Start Date End Date Taking? Authorizing Provider  acyclovir (ZOVIRAX) 400 MG tablet Take 400 mg by mouth daily.  03/01/13   [provider]  amLODipine (NORVASC) 5 MG tablet Take 5 mg by mouth every evening.  09/09/16   [provider]  aspirin EC 81 MG tablet Take 81 mg by mouth daily.    [provider]  atenolol (TENORMIN) 50 MG tablet Take 50 mg by mouth at bedtime.     [provider]  atorvastatin (LIPITOR) 40 MG tablet Take 40 mg by mouth every evening.  09/09/16   [provider]  Calcium Carbonate-Vitamin D (CALCIUM-D) 600-400 MG-UNIT TABS Take 1 mg by mouth daily.     [provider]  cetirizine (ZYRTEC) 10 MG tablet Take 10 mg by mouth daily.     [provider]  diazepam (VALIUM) 5 MG tablet Take 1 tablet (5 mg total) by mouth every 12 (twelve) hours as needed for anxiety. For anxiety Patient taking differently: Take 5 mg by mouth at bedtime as needed (sleep). For anxiety 12/25/12   Barton Dubois, MD  EPINEPHrine 0.3 mg/0.3 mL IJ SOAJ injection Inject 0.3 mg into the muscle as directed.  05/01/14   [provider]  Glucosamine-Chondroit-Vit C-Mn (GLUCOSAMINE 1500 COMPLEX PO) Take 1 capsule by mouth daily.    [provider]   hydrALAZINE (APRESOLINE) 50 MG tablet Take 1 tablet (50 mg total) by mouth 3 (three) times daily. 10/12/16   Neldon Labella, NP  levothyroxine (SYNTHROID, LEVOTHROID) 112 MCG tablet Take 112 mcg daily before breakfast by mouth.    [provider]  magnesium oxide (MAG-OX) 400 MG tablet Take 400 mg by mouth at bedtime.    [provider]  meclizine (ANTIVERT) 25 MG tablet Take 25 mg by mouth every 12 (twelve) hours as needed for dizziness.     [provider]  mirtazapine (REMERON) 15  MG tablet Take 15 mg by mouth at bedtime.    [provider]  nitroGLYCERIN (NITRODUR - DOSED IN MG/24 HR) 0.1 mg/hr patch Place 0.1 mg onto the skin daily.  09/07/16   [provider]  omega-3 acid ethyl esters (LOVAZA) 1 G capsule Take 1 g by mouth at bedtime.     [provider]  omeprazole (PRILOSEC) 20 MG capsule Take 20 mg by mouth at bedtime.  11/19/13   [provider]  oxyCODONE (ROXICODONE) 15 MG immediate release tablet Take 15 mg by mouth every 4 (four) hours as needed for pain.     [provider]  polyethylene glycol (MIRALAX / GLYCOLAX) packet Take 17 g by mouth daily as needed for mild constipation.  09/11/08   [provider]  predniSONE (DELTASONE) 5 MG tablet Take 5 mg by mouth at bedtime.     [provider]  Rivaroxaban (XARELTO) 15 MG TABS tablet Take 1 tablet (15 mg total) by mouth daily with supper. Patient taking differently: Take 15 mg every 3 (three) days by mouth.  09/14/16   Adrian Prows, MD  sertraline (ZOLOFT) 100 MG tablet Take 100 mg by mouth at bedtime.  11/20/12   [provider]  simvastatin (ZOCOR) 40 MG tablet Take 40 mg daily by mouth.    [provider]  triamcinolone cream (KENALOG) 0.1 % Apply 1 application topically 2 (two) times daily.     [provider]  zaleplon (SONATA) 10 MG capsule Take 10 mg by mouth at bedtime. 08/25/16   [provider]     Physical Exam: Vitals:   03/19/19 0200 03/19/19 0300 03/19/19 0332 03/19/19 0400  BP: (!) 154/82 (!) 153/83 (!) 165/90 (!) 157/88  Pulse: 63 70 70 69  Resp: 16 16 16    Temp:      TempSrc:      SpO2: 93% 94% 98% 94%  Weight:      Height:        Constitutional: NAD, calm, comfortable Eyes: PERRL, lids and conjunctivae normal ENMT: Mucous membranes are moist. Posterior pharynx clear of any exudate or lesions.Normal dentition.  Neck: normal, supple, no masses, no thyromegaly Respiratory: clear to auscultation bilaterally, no wheezing, no crackles. Normal respiratory effort. No accessory muscle use.  Cardiovascular: Regular rate and rhythm, no murmurs / rubs / gallops. No extremity edema. 2+ pedal pulses. No carotid bruits.  Abdomen: no tenderness, no masses palpated. No hepatosplenomegaly. Bowel sounds positive.  Musculoskeletal: no clubbing / cyanosis. No joint deformity upper and lower extremities. Good ROM, no contractures. Normal muscle tone.  Skin: no rashes, lesions, ulcers. No induration Neurologic: CN 2-12 grossly intact. Sensation intact, DTR normal. Strength 5/5 in all 4.  Psychiatric: Normal judgment and insight. Alert and oriented x 3. Normal mood.    Labs on Admission: I have personally reviewed following labs and imaging studies  CBC: Recent Labs  Lab 03/19/19 0043  WBC 6.6  NEUTROABS 4.8  HGB 12.1*  HCT 38.2*  MCV 100.5*  PLT 355   Basic Metabolic Panel: Recent Labs  Lab 03/19/19 0043  NA 138  K 3.7  CL 102  CO2 27  GLUCOSE 158*  BUN 14  CREATININE 1.49*  CALCIUM 9.1   GFR: Estimated Creatinine Clearance: 44.6 mL/min (A) (by C-G formula based on SCr of 1.49 mg/dL (H)). Liver Function Tests: Recent Labs  Lab 03/19/19 0043  AST 39  ALT 20  ALKPHOS 89  BILITOT 0.5  PROT 6.8  ALBUMIN  3.3*   Recent Labs  Lab 03/19/19 0043  LIPASE 28   No results for input(s): AMMONIA in the last 168 hours. Coagulation Profile: No results for  input(s): INR, PROTIME in the last 168 hours. Cardiac Enzymes: No results for input(s): CKTOTAL, CKMB, CKMBINDEX, TROPONINI in the last 168 hours. BNP (last 3 results) No results for input(s): PROBNP in the last 8760 hours. HbA1C: No results for input(s): HGBA1C in the last 72 hours. CBG: No results for input(s): GLUCAP in the last 168 hours. Lipid Profile: No results for input(s): CHOL, HDL, LDLCALC, TRIG, CHOLHDL, LDLDIRECT in the last 72 hours. Thyroid Function Tests: No results for input(s): TSH, T4TOTAL, FREET4, T3FREE, THYROIDAB in the last 72 hours. Anemia Panel: No results for input(s): VITAMINB12, FOLATE, FERRITIN, TIBC, IRON, RETICCTPCT in the last 72 hours. Urine analysis:    Component Value Date/Time   COLORURINE STRAW (A) 03/19/2019 0240   APPEARANCEUR CLEAR 03/19/2019 0240   LABSPEC 1.009 03/19/2019 0240   PHURINE 8.0 03/19/2019 0240   GLUCOSEU NEGATIVE 03/19/2019 0240   HGBUR NEGATIVE 03/19/2019 0240   BILIRUBINUR NEGATIVE 03/19/2019 0240   KETONESUR NEGATIVE 03/19/2019 0240   PROTEINUR NEGATIVE 03/19/2019 0240   UROBILINOGEN 0.2 03/26/2013 1615   NITRITE NEGATIVE 03/19/2019 0240   LEUKOCYTESUR NEGATIVE 03/19/2019 0240    Radiological Exams on Admission: Dg Chest Port 1 View  Result Date: 03/19/2019 CLINICAL DATA:  Nausea and vomiting with chest pain EXAM: PORTABLE CHEST 1 VIEW COMPARISON:  Chest CT 04/21/2015 FINDINGS: The heart size and mediastinal contours are within normal limits. Both lungs are clear. The visualized skeletal structures are unremarkable. IMPRESSION: No active disease. Electronically Signed   By: Ulyses Jarred M.D.   On: 03/19/2019 01:17   Ct Angio Chest/abd/pel For Dissection W And/or Wo Contrast  Result Date: 03/19/2019 CLINICAL DATA:  78 year old male with abdominal pain and vomiting. Concern for aortic dissection. History of squamous cell carcinoma of the head and neck. EXAM: CT ANGIOGRAPHY CHEST, ABDOMEN AND PELVIS TECHNIQUE:  Multidetector CT imaging through the chest, abdomen and pelvis was performed using the standard protocol during bolus administration of intravenous contrast. Multiplanar reconstructed images and MIPs were obtained and reviewed to evaluate the vascular anatomy. CONTRAST:  42mL OMNIPAQUE IOHEXOL 350 MG/ML SOLN COMPARISON:  Chest radiograph dated 03/19/2019 and CT dated 03/21/2015 FINDINGS: CTA CHEST FINDINGS Cardiovascular: There is no cardiomegaly or pericardial effusion. Advanced coronary vascular calcification of the LAD. Minimal atherosclerotic calcification of the thoracic aorta. No aneurysmal dilatation or dissection. The origins of the great vessels of the aortic arch appear patent. No pulmonary artery embolus identified. Mediastinum/Nodes: Right hilar calcified lymph nodes. No mediastinal adenopathy. There is an area of dilatation of the upper esophagus which may represent a diverticulum. No mediastinal fluid collection. Lungs/Pleura: There is a cluster of nodular density in the right upper lobe concerning for an infectious process such as atypical infection. There is an 8 mm nodular density in the right lower lobe (series 9 image 99) which may represent inflammatory or infectious in etiology although metastatic disease is not entirely excluded. Clinical correlation and follow-up recommended. There are bibasilar dependent atelectatic changes and scarring. No lobar consolidation, pleural effusion, or pneumothorax. The central airways are patent. Musculoskeletal: There is osteopenia with degenerative changes of the spine. No acute osseous pathology. Old right posterior 12 rib fracture. Review of the MIP images confirms the above findings. CTA ABDOMEN AND PELVIS FINDINGS VASCULAR Aorta: Mild atherosclerotic disease. No aneurysmal dilatation or dissection. The aorta is mildly tortuous. Celiac:  Patent without evidence of aneurysm, dissection, vasculitis or significant stenosis. SMA: Atherosclerotic calcification.  The SMA is patent. Renals: There is atherosclerotic calcification of the renal ostia. The renal arteries remain patent. IMA: Patent without evidence of aneurysm, dissection, vasculitis or significant stenosis. 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 The iliac arteries are otherwise patent. Veins: No obvious venous abnormality within the limitations of this arterial phase study. No portal venous gas. Review of the MIP images confirms the above findings. NON-VASCULAR No intra-abdominal free air or free fluid. Hepatobiliary: Apparent fatty infiltration of the liver. No intrahepatic biliary ductal dilatation. There is a calcified stone within the gallbladder. The gallbladder is distended. A stone is noted at the gallbladder neck. There is inflammatory changes and haziness of the gallbladder wall. Findings most consistent with an acute cholecystitis. Further evaluation with ultrasound recommended. Pancreas: Unremarkable. No pancreatic ductal dilatation or surrounding inflammatory changes. Spleen: Normal in size without focal abnormality. Adrenals/Urinary Tract: The adrenal glands are unremarkable. Mild bilateral renal parenchyma atrophy with areas of cortical scarring in the inferior pole of the right kidney. There is no hydronephrosis on either side. The visualized ureters and urinary bladder appear unremarkable. Stomach/Bowel: There is sigmoid diverticulosis without active inflammatory changes. No bowel obstruction or active inflammation. Normal appendix. Lymphatic: No adenopathy. Reproductive: Dystrophic calcification of the prostate gland. The seminal vesicles are symmetric. Other: None Musculoskeletal: Osteopenia. No acute osseous pathology. Left femoral neck fixation screws. Review of the MIP images confirms the above findings. IMPRESSION: 1. No aortic aneurysm or dissection. No pulmonary artery embolus identified. 2. Cholelithiasis with  findings of acute cholecystitis. Further evaluation with ultrasound recommended. 3. Cluster of nodular densities in the right upper lobe concerning for an infectious process such as atypical pneumonia. Clinical correlation and follow-up recommended. 4. An 8 mm right lower lobe nodular density is also likely infectious in etiology. Non-contrast chest CT at 6-12 months is recommended. If the nodule is stable at time of repeat CT, then future CT at 18-24 months (from today's scan) is considered optional for low-risk patients, but is recommended for high-risk patients. This recommendation follows the consensus statement: Guidelines for Management of Incidental Pulmonary Nodules Detected on CT Images: From the Fleischner Society 2017; Radiology 2017; 284:228-243. 5. 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 6. Sigmoid diverticulosis. No bowel obstruction or active inflammation. Normal appendix. Electronically Signed   By: Anner Crete M.D.   On: 03/19/2019 03:54   US Abdomen Limited Ruq  Result Date: 03/19/2019 CLINICAL DATA:  78 year old male with right upper quadrant pain and suspicion of cholelithiasis and cholecystitis on CTA Chest, Abdomen, and Pelvis earlier today EXAM: ULTRASOUND ABDOMEN LIMITED RIGHT UPPER QUADRANT COMPARISON:  CTA Chest, Abdomen, and Pelvis earlier today. FINDINGS: Gallbladder: On some images the gallbladder wall appears normal, but on image 6 there is thickening at the fundus of 5-6 millimeters. Echogenic partially shadowing cholelithiasis identified (image 11). There is also dependent sludge within the gallbladder. Gallstones size estimated at 8 millimeters. No sonographic Murphy sign elicited. Common bile duct: Diameter: 5 millimeters, normal. Liver: Echogenic liver (image 22). No discrete liver lesion. No intrahepatic biliary ductal dilatation. Portal vein is patent on color Doppler imaging with normal direction of blood flow towards the  liver. Other findings: Negative visible right kidney. IMPRESSION: 1. Focal gallbladder wall thickening with sludge and gallstones. Together with the earlier CTA appearance this is suspicious for Acute Cholecystitis. 2.  CBD remains within normal limits.  Fatty liver disease. Electronically Signed   By: Genevie Ann M.D.   On: 03/19/2019 05:12    EKG: Independently reviewed.  Assessment/Plan Principal Problem:   Acute cholecystitis Active Problems:   HTN (hypertension)   RA (rheumatoid arthritis) ([place])   CAD, LAD stent 2002   PAF, new Feb 2014 with SSS component   Chronic anticoagulation, new Feb 2014- Xarelto   Pulmonary nodule   CAP (community acquired pneumonia)    1. Acute cholecystitis - 1. NPO 2. IVF: NS at 100 3. Morphine PRN pain 4. Zofran PRN nausea 5. Zosyn and doxy (for this and CAP) 6. BCx 7. Repeat CBC/CMP tomorrow AM 8. Gen surg to consult 2. CAP - 1. Zosyn / doxy 2. Sputum and BCx 3. COVID test was negative 3. PAF - 1. Continue home beta blocker once med rec completed 2. Hold Xarelto for the moment pending gen surg recs for the cholecystitis 4. HTN, CAD - 1. Continue home BP meds, statin, once med rec completed 2. Hold ASA 5. RA - 1. Continue home prednisone 2. Doesn't look like he needs stress dose steroids at this point, though certainly could end up needing this or dose increase during admission if he becomes more ill. 6. Pulmonary nodule - 1. Possibly just infectious 2. But given h/o head and neck CA, definitely warrants follow up CT in 6 months.  DVT prophylaxis: SCDs Code Status: Full Family Communication: No family in room Disposition Plan: Home after admit Consults called: Dr. Ninfa Linden Admission status: Admit to inpatient  Severity of Illness: The appropriate patient status for this patient is INPATIENT. Inpatient status is judged to be reasonable and necessary in order to provide the required intensity of service to ensure the patient's safety. The  patient's presenting symptoms, physical exam findings, and initial radiographic and laboratory data in the context of their chronic comorbidities is felt to place them at high risk for further clinical deterioration. Furthermore, it is not anticipated that the patient will be medically stable for discharge from the hospital within 2 midnights of admission. The following factors support the patient status of inpatient.   IP status for treatment of acute cholecystitis.  This in a patient who is immuno suppressed on prednisone.  Also has CAP too.   * I certify that at the point of admission it is my clinical judgment that the patient will require inpatient hospital care spanning beyond 2 midnights from the point of admission due to high intensity of service, high risk for further deterioration and high frequency of surveillance required.*    ,  M. DO [facility]  How to contact the [facility] Attending or Consulting provider [place] or covering provider during after hours [place], for this patient?  1. Check the care team in [facility] and look for a) attending/consulting [facility] provider listed and b) the [facility] team listed 2. Log into [email or web address]  Amion Physician Scheduling and messaging for groups and whole hospitals  On call and physician scheduling software for group practices, residents, hospitalists and other medical providers for call, clinic, rotation and shift schedules. OnCall [employer] is a hospital-wide system for scheduling doctors and paging doctors on call. EasyPlot is for scientific plotting and data analysis.  [email or web address]  and use [place]'s universal password to access. If you do not have the password, please contact the hospital operator.  3. Locate the [facility] provider you are looking for under [facility] and page to a number  that you can be directly reached. 4. If you still have difficulty reaching the provider, please page the [facility] (Director on Call) for the  Hospitalists listed on amion for assistance.  03/19/2019, 6:13 AM

## 2019-03-19 NOTE — ED Notes (Signed)
Patient is aware that urine sample is needed but states that he does not have to use the restroom at this time. Urinal at the bedside.

## 2019-03-19 NOTE — Progress Notes (Signed)
Patient seen and examined personally, I reviewed the chart, history and physical and admission note, done by admitting physician this morning and agree with the same with following addendum.  Please refer to the morning admission note for more detailed plan of care.  Briefly, 78 y.o. male with medical history significant of CAD s/p stentx4, PAF on Xarelto, RA on prednisone, HTN, HCV, head and neck CA in remission, CKD (looks like stage 3) presented he ED at [facility] with c/o "food poisoning" with N/V/D and diffuse abd and lower back pain since last night. Symptoms started after taking [employer] chicken and potatoes.  This am no complaints  Discussed w surgery, presentation atypical for acute cholecystitis HIDA scan planned- to r/o acute cholecystitis, if positive will need cardio preop eval for surgery, xarelto remains on hold. Other issues as per HPI,  RUL cluster of densities, possible atypical pneumonia- on zosyn/doxy. 8 mm right lower lobe nodular density is also likely infectious in etiology. Non-contrast chest CT at 6-12 months is recommended.

## 2019-03-19 NOTE — Anesthesia Preprocedure Evaluation (Addendum)
Anesthesia Evaluation  Patient identified by MRN, date of birth, ID band Patient awake    Reviewed: Allergy & Precautions, NPO status , Patient's Chart, lab work & pertinent test results, reviewed documented beta blocker date and time   History of Anesthesia Complications Negative for: history of anesthetic complications  Airway Mallampati: II  TM Distance: <3 FB Neck ROM: Full    Dental  (+) Edentulous Upper, Edentulous Lower   Pulmonary sleep apnea , COPD, former smoker,    Pulmonary exam normal breath sounds clear to auscultation       Cardiovascular Exercise Tolerance: Poor hypertension, Pt. on medications and Pt. on home beta blockers + CAD and + Cardiac Stents (2000 to LAD, multiple DES to LAD and OM in 2017 )  Normal cardiovascular exam+ dysrhythmias (on Xarelto, last taken on 03/17/19) Atrial Fibrillation  Rhythm:Regular Rate:Normal  EF 65% in 2017   Neuro/Psych Anxiety negative neurological ROS     GI/Hepatic GERD  Controlled,(+) Hepatitis -, C  Endo/Other  Hypothyroidism   Renal/GU Renal InsufficiencyRenal disease     Musculoskeletal  (+) Arthritis ,   Abdominal   Peds  Hematology  (+) anemia ,   Anesthesia Other Findings Day of surgery medications reviewed with the patient.  Reproductive/Obstetrics                            Anesthesia Physical Anesthesia Plan  ASA: III  Anesthesia Plan: General   Post-op Pain Management:    Induction: Intravenous and Rapid sequence  PONV Risk Score and Plan: 3 and Treatment may vary due to age or medical condition, Ondansetron and Dexamethasone  Airway Management Planned: Oral ETT  Additional Equipment:   Intra-op Plan:   Post-operative Plan: Extubation in OR  Informed Consent: I have reviewed the patients History and Physical, chart, labs and discussed the procedure including the risks, benefits and alternatives for the proposed  anesthesia with the patient or authorized representative who has indicated his/her understanding and acceptance.     Dental advisory given  Plan Discussed with: CRNA  Anesthesia Plan Comments:        Anesthesia Quick Evaluation

## 2019-03-19 NOTE — ED Notes (Signed)
ED TO INPATIENT HANDOFF REPORT  ED Nurse Name and Phone #:  Earnest Bailey [phone]  S Name/Age/Gender Gary [name] 78 y.o. male Room/Bed: WA21/WA21  Code Status   Code Status: Full Code  Home/SNF/Other Home Patient oriented to: self, place, time and situation Is this baseline? Yes   Triage Complete: Triage complete  Chief Complaint Emesis  Triage Note Pt bib EMS and coming from home.  Pt ate grilled chicken and mashed potatoes from [facility] at around 6pm.  At about 7pm, pt began experiencing n/v/d since. Pt report back pain but pt has chronic back pain d/t arthritis.  Hx heart stents and takes Xarelto. Pt denies dizziness upon standing to EMS. EMS gave pt 4mg  IV zofran enroute with improvement in n/v.  Pt is sinus brady but take propanolol.    Allergies Allergies  Allergen Reactions  . Bee Venom Swelling  . Cellcept [Mycophenolate Mofetil] Other (See Comments)    Reaction unknown  . Colcrys [Colchicine] Other (See Comments)    myalgias  . Erythromycin Other (See Comments)    Reaction unknown  . Methotrexate Derivatives Hives, Itching and Rash    Only in tablet form  . Neosporin [Neomycin-Bacitracin Zn-Polymyx] Rash  . Trazodone Palpitations    Weakness, loss of appetite  . Ultram [Tramadol] Rash    Level of Care/Admitting Diagnosis ED Disposition    ED Disposition Condition [facility] Area: [place] [[id number]]  Level of Care: Med-Surg [16]  Covid Evaluation: Screening Protocol (No Symptoms)  Diagnosis: CAP (community acquired pneumonia) [220254]  Admitting Physician: Etta Quill [id number]  Attending Physician: Etta Quill [phone]  Estimated length of stay: past midnight tomorrow  Certification:: I certify this patient will need inpatient services for at least 2 midnights  PT Class (Do Not Modify): Inpatient [101]  PT Acc Code (Do Not Modify): Private [1]       B Medical/Surgery History Past Medical History:  Diagnosis Date   . Anemia    "chronic anemic"  . Anginal pain ([place])   . Anxiety   . 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 Cancer ([place])    pre-cancerous lesions removed 11/22/12 and hx of  . 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 Chronic back pain    hx of  . Chronic kidney disease    renal insufficiency, [place] kidney  . Coronary artery disease    stent placement 2000, does not see cardiologist at this time  . Dyslipidemia   . Full dentures    Edentulous since age [age]  . GERD (gastroesophageal reflux disease)   . Gout    pseudo gout  . Hearing impairment    job related, hbilat hearing aids  . Hepatitis    hepatitis "C"  . History of radiation therapy 03/11/2013-04/23/2013   right retromolar trigone/54Gy/83fx  . Hypertension    sees Dr. Deland Pretty  . Hypertension   . Hypothyroid   . Insomnia    cpap  . Rheumatoid arthritis ([place])    "all over" (09/13/2016)  . Sleep apnea    "tried mask 3 different times; couldn't tolerate it" (09/13/2016)  . Thyroid disease    Hypothyroidism   Past Surgical History:  Procedure Laterality Date  . CARDIAC CATHETERIZATION N/A 09/13/2016   Procedure: Left Heart Cath and Coronary Angiography;  Surgeon: Adrian Prows, MD;  Location: [facility];  Service: Cardiovascular;  Laterality: N/A;  .  CARDIAC CATHETERIZATION N/A 10/11/2016   Procedure: Coronary Stent Intervention;  Surgeon: Adrian Prows, MD;  Location: [facility];  Service: Cardiovascular;  Laterality: N/A;  LAD & 1st Diag  . CATARACT EXTRACTION W/ INTRAOCULAR LENS  IMPLANT, BILATERAL Bilateral 2007  . CORONARY ANGIOPLASTY  09/13/2016  . CORONARY ANGIOPLASTY WITH STENT PLACEMENT  2002  . FRACTURE SURGERY    . HIP PINNING,CANNULATED Left 12/22/2012   Procedure: CANNULATED HIP PINNING;  Surgeon: Mcarthur Rossetti, MD;  Location: [facility];  Service: Orthopedics;  Laterality: Left;  [name] MULTIPLE TOOTH EXTRACTIONS  ~ 1962   Edentulous  .  RADICAL NECK DISSECTION Right 01/22/2013   [facility]  . SKIN CANCER EXCISION     "face, ears"  . SKIN GRAFT Left ~ 1961   "grafted skin off my stomach & put it on left hand that I had caught in fan belt"  . TONSILLECTOMY       A IV Location/Drains/Wounds Patient Lines/Drains/Airways Status   Active Line/Drains/Airways    Name:   Placement date:   Placement time:   Site:   Days:   Peripheral IV 03/19/19 Left Antecubital   03/19/19    0026    Antecubital   less than 1   Incision 12/22/12 Hip Left   12/22/12    0816     2278          Intake/Output Last 24 hours No intake or output data in the 24 hours ending 03/19/19 0617  Labs/Imaging Results for orders placed or performed during the hospital encounter of 03/19/19 (from the past 48 hour(s))  CBC with Differential     Status: Abnormal   Collection Time: 03/19/19 12:43 AM  Result Value Ref Range   WBC 6.6 4.0 - 10.5 K/uL   RBC 3.80 (L) 4.22 - 5.81 MIL/uL   Hemoglobin 12.1 (L) 13.0 - 17.0 g/dL   HCT 38.2 (L) 39.0 - 52.0 %   MCV 100.5 (H) 80.0 - 100.0 fL   MCH 31.8 26.0 - 34.0 pg   MCHC 31.7 30.0 - 36.0 g/dL   RDW 14.7 11.5 - 15.5 %   Platelets 226 150 - 400 K/uL   nRBC 0.0 0.0 - 0.2 %   Neutrophils Relative % 73 %   Neutro Abs 4.8 1.7 - 7.7 K/uL   Lymphocytes Relative 14 %   Lymphs Abs 0.9 0.7 - 4.0 K/uL   Monocytes Relative 6 %   Monocytes Absolute 0.4 0.1 - 1.0 K/uL   Eosinophils Relative 7 %   Eosinophils Absolute 0.5 0.0 - 0.5 K/uL   Basophils Relative 0 %   Basophils Absolute 0.0 0.0 - 0.1 K/uL   Immature Granulocytes 0 %   Abs Immature Granulocytes 0.02 0.00 - 0.07 K/uL    Comment: Performed at [facility], [place] [address] [place], [place] [address]  Comprehensive metabolic panel     Status: Abnormal   Collection Time: 03/19/19 12:43 AM  Result Value Ref Range   Sodium 138 135 - 145 mmol/L   Potassium 3.7 3.5 - 5.1 mmol/L   Chloride 102 98 - 111 mmol/L   CO2 27 22 - 32 mmol/L   Glucose,  Bld 158 (H) 70 - 99 mg/dL   BUN 14 8 - 23 mg/dL   Creatinine, Ser 1.49 (H) 0.61 - 1.24 mg/dL   Calcium 9.1 8.9 - 10.3 mg/dL   Total Protein 6.8 6.5 - 8.1 g/dL   Albumin 3.3 (L) 3.5 - 5.0 g/dL  AST 39 15 - 41 U/L   ALT 20 0 - 44 U/L   Alkaline Phosphatase 89 38 - 126 U/L   Total Bilirubin 0.5 0.3 - 1.2 mg/dL   GFR calc non Af Amer 44 (L) >60 mL/min   GFR calc Af Amer 51 (L) >60 mL/min   Anion gap 9 5 - 15    Comment: Performed at [facility], [place] [address], [place], [place] [address]  Lipase, blood     Status: None   Collection Time: 03/19/19 12:43 AM  Result Value Ref Range   Lipase 28 11 - 51 U/L    Comment: Performed at [facility], [place] [address], [place], [place] [address]  SARS Coronavirus 2 (CEPHEID - Performed in [place] hospital lab), Hosp Order     Status: None   Collection Time: 03/19/19 12:44 AM  Result Value Ref Range   SARS Coronavirus 2 NEGATIVE NEGATIVE    Comment: (NOTE) If result is NEGATIVE SARS-CoV-2 target nucleic acids are NOT DETECTED. The SARS-CoV-2 RNA is generally detectable in upper and lower  respiratory specimens during the acute phase of infection. The lowest  concentration of SARS-CoV-2 viral copies this assay can detect is 250  copies / mL. A negative result does not preclude SARS-CoV-2 infection  and should not be used as the sole basis for treatment or other  patient management decisions.  A negative result may occur with  improper specimen collection / handling, submission of specimen other  than nasopharyngeal swab, presence of viral mutation(s) within the  areas targeted by this assay, and inadequate number of viral copies  (<250 copies / mL). A negative result must be combined with clinical  observations, patient history, and epidemiological information. If result is POSITIVE SARS-CoV-2 target nucleic acids are DETECTED. The SARS-CoV-2 RNA is generally detectable in upper and lower  respiratory  specimens dur ing the acute phase of infection.  Positive  results are indicative of active infection with SARS-CoV-2.  Clinical  correlation with patient history and other diagnostic information is  necessary to determine patient infection status.  Positive results do  not rule out bacterial infection or co-infection with other viruses. If result is PRESUMPTIVE POSTIVE SARS-CoV-2 nucleic acids MAY BE PRESENT.   A presumptive positive result was obtained on the submitted specimen  and confirmed on repeat testing.  While 2019 novel coronavirus  (SARS-CoV-2) nucleic acids may be present in the submitted sample  additional confirmatory testing may be necessary for epidemiological  and / or clinical management purposes  to differentiate between  SARS-CoV-2 and other Sarbecovirus currently known to infect humans.  If clinically indicated additional testing with an alternate test  methodology [id number]) is advised. The SARS-CoV-2 RNA is generally  detectable in upper and lower respiratory sp ecimens during the acute  phase of infection. The expected result is Negative. Fact Sheet for Patients:  StrictlyIdeas.no Fact Sheet for Healthcare Providers: BankingDealers.co.za This test is not yet approved or cleared by the [place] FDA and has been authorized for detection and/or diagnosis of SARS-CoV-2 by FDA under an Emergency Use Authorization (EUA).  This EUA will remain in effect (meaning this test can be used) for the duration of the COVID-19 declaration under Section 564(b)(1) of the Act, 21 U.S.C. section 360bbb-3(b)(1), unless the authorization is terminated or revoked sooner. Performed at [facility], [place] [address] [place], [place] [address]   Urinalysis, Routine w reflex microscopic     Status: Abnormal  Collection Time: 03/19/19  2:40 AM  Result Value Ref Range   Color, Urine STRAW (A) YELLOW   APPearance  CLEAR CLEAR   Specific Gravity, Urine 1.009 1.005 - 1.030   pH 8.0 5.0 - 8.0   Glucose, UA NEGATIVE NEGATIVE mg/dL   Hgb urine dipstick NEGATIVE NEGATIVE   Bilirubin Urine NEGATIVE NEGATIVE   Ketones, ur NEGATIVE NEGATIVE mg/dL   Protein, ur NEGATIVE NEGATIVE mg/dL   Nitrite NEGATIVE NEGATIVE   Leukocytes,Ua NEGATIVE NEGATIVE    Comment: Performed at [place] [address], [place], [place] [address]   Dg Chest Port 1 View  Result Date: 03/19/2019 CLINICAL DATA:  Nausea and vomiting with chest pain EXAM: PORTABLE CHEST 1 VIEW COMPARISON:  Chest CT 04/21/2015 FINDINGS: The heart size and mediastinal contours are within normal limits. Both lungs are clear. The visualized skeletal structures are unremarkable. IMPRESSION: No active disease. Electronically Signed   By: Ulyses Jarred M.D.   On: 03/19/2019 01:17   Ct Angio Chest/abd/pel For Dissection W And/or Wo Contrast  Result Date: 03/19/2019 CLINICAL DATA:  78 year old male with abdominal pain and vomiting. Concern for aortic dissection. History of squamous cell carcinoma of the head and neck. EXAM: CT ANGIOGRAPHY CHEST, ABDOMEN AND PELVIS TECHNIQUE: Multidetector CT imaging through the chest, abdomen and pelvis was performed using the standard protocol during bolus administration of intravenous contrast. Multiplanar reconstructed images and MIPs were obtained and reviewed to evaluate the vascular anatomy. CONTRAST:  6mL OMNIPAQUE IOHEXOL 350 MG/ML SOLN COMPARISON:  Chest radiograph dated 03/19/2019 and CT dated 03/21/2015 FINDINGS: CTA CHEST FINDINGS Cardiovascular: There is no cardiomegaly or pericardial effusion. Advanced coronary vascular calcification of the LAD. Minimal atherosclerotic calcification of the thoracic aorta. No aneurysmal dilatation or dissection. The origins of the great vessels of the aortic arch appear patent. No pulmonary artery embolus identified. Mediastinum/Nodes: Right hilar calcified lymph nodes.  No mediastinal adenopathy. There is an area of dilatation of the upper esophagus which may represent a diverticulum. No mediastinal fluid collection. Lungs/Pleura: There is a cluster of nodular density in the right upper lobe concerning for an infectious process such as atypical infection. There is an 8 mm nodular density in the right lower lobe (series 9 image 99) which may represent inflammatory or infectious in etiology although metastatic disease is not entirely excluded. Clinical correlation and follow-up recommended. There are bibasilar dependent atelectatic changes and scarring. No lobar consolidation, pleural effusion, or pneumothorax. The central airways are patent. Musculoskeletal: There is osteopenia with degenerative changes of the spine. No acute osseous pathology. Old right posterior 12 rib fracture. Review of the MIP images confirms the above findings. CTA ABDOMEN AND PELVIS FINDINGS VASCULAR Aorta: Mild atherosclerotic disease. No aneurysmal dilatation or dissection. The aorta is mildly tortuous. Celiac: Patent without evidence of aneurysm, dissection, vasculitis or significant stenosis. SMA: Atherosclerotic calcification. The SMA is patent. Renals: There is atherosclerotic calcification of the renal ostia. The renal arteries remain patent. IMA: Patent without evidence of aneurysm, dissection, vasculitis or significant stenosis. 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 The iliac arteries are otherwise patent. Veins: No obvious venous abnormality within the limitations of this arterial phase study. No portal venous gas. Review of the MIP images confirms the above findings. NON-VASCULAR No intra-abdominal free air or free fluid. Hepatobiliary: Apparent fatty infiltration of the liver. No intrahepatic biliary ductal dilatation. There is a calcified stone within the gallbladder. The gallbladder is distended. A  stone is noted at  the gallbladder neck. There is inflammatory changes and haziness of the gallbladder wall. Findings most consistent with an acute cholecystitis. Further evaluation with ultrasound recommended. Pancreas: Unremarkable. No pancreatic ductal dilatation or surrounding inflammatory changes. Spleen: Normal in size without focal abnormality. Adrenals/Urinary Tract: The adrenal glands are unremarkable. Mild bilateral renal parenchyma atrophy with areas of cortical scarring in the inferior pole of the right kidney. There is no hydronephrosis on either side. The visualized ureters and urinary bladder appear unremarkable. Stomach/Bowel: There is sigmoid diverticulosis without active inflammatory changes. No bowel obstruction or active inflammation. Normal appendix. Lymphatic: No adenopathy. Reproductive: Dystrophic calcification of the prostate gland. The seminal vesicles are symmetric. Other: None Musculoskeletal: Osteopenia. No acute osseous pathology. Left femoral neck fixation screws. Review of the MIP images confirms the above findings. IMPRESSION: 1. No aortic aneurysm or dissection. No pulmonary artery embolus identified. 2. Cholelithiasis with findings of acute cholecystitis. Further evaluation with ultrasound recommended. 3. Cluster of nodular densities in the right upper lobe concerning for an infectious process such as atypical pneumonia. Clinical correlation and follow-up recommended. 4. An 8 mm right lower lobe nodular density is also likely infectious in etiology. Non-contrast chest CT at 6-12 months is recommended. If the nodule is stable at time of repeat CT, then future CT at 18-24 months (from today's scan) is considered optional for low-risk patients, but is recommended for high-risk patients. This recommendation follows the consensus statement: Guidelines for Management of Incidental Pulmonary Nodules Detected on CT Images: From the Fleischner Society 2017; Radiology 2017; 284:228-243. 5. 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 6. Sigmoid diverticulosis. No bowel obstruction or active inflammation. Normal appendix. Electronically Signed   By: Anner Crete M.D.   On: 03/19/2019 03:54   US Abdomen Limited Ruq  Result Date: 03/19/2019 CLINICAL DATA:  78 year old male with right upper quadrant pain and suspicion of cholelithiasis and cholecystitis on CTA Chest, Abdomen, and Pelvis earlier today EXAM: ULTRASOUND ABDOMEN LIMITED RIGHT UPPER QUADRANT COMPARISON:  CTA Chest, Abdomen, and Pelvis earlier today. FINDINGS: Gallbladder: On some images the gallbladder wall appears normal, but on image 6 there is thickening at the fundus of 5-6 millimeters. Echogenic partially shadowing cholelithiasis identified (image 11). There is also dependent sludge within the gallbladder. Gallstones size estimated at 8 millimeters. No sonographic Murphy sign elicited. Common bile duct: Diameter: 5 millimeters, normal. Liver: Echogenic liver (image 22). No discrete liver lesion. No intrahepatic biliary ductal dilatation. Portal vein is patent on color Doppler imaging with normal direction of blood flow towards the liver. Other findings: Negative visible right kidney. IMPRESSION: 1. Focal gallbladder wall thickening with sludge and gallstones. Together with the earlier CTA appearance this is suspicious for Acute Cholecystitis. 2. CBD remains within normal limits.  Fatty liver disease. Electronically Signed   By: Genevie Ann M.D.   On: 03/19/2019 05:12    Pending Labs Unresulted Labs (From admission, onward)    Start     Ordered   03/20/19 0500  CBC  Tomorrow morning,   R     03/19/19 0554   03/20/19 0500  Comprehensive metabolic panel  Tomorrow morning,   R     03/19/19 0554   03/19/19 0605  Expectorated sputum assessment w rflx to resp cult  Once,   R     03/19/19 0604   03/19/19 0402  Blood culture (routine x 2)  BLOOD CULTURE X 2,   STAT  03/19/19 0401           Vitals/Pain Today's Vitals   03/19/19 0239 03/19/19 0300 03/19/19 0332 03/19/19 0400  BP:  (!) 153/83 (!) 165/90 (!) 157/88  Pulse:  70 70 69  Resp:  16 16   Temp:      TempSrc:      SpO2:  94% 98% 94%  Weight:      Height:      PainSc: 2        Isolation Precautions No active isolations  Medications Medications  0.9 %  sodium chloride infusion ( Intravenous New Bag/Given 03/19/19 0342)  sodium chloride (PF) 0.9 % injection (has no administration in time range)  doxycycline (VIBRAMYCIN) 100 mg in sodium chloride 0.9 % 250 mL IVPB (100 mg Intravenous New Bag/Given 03/19/19 0528)  acetaminophen (TYLENOL) tablet 650 mg (has no administration in time range)    Or  acetaminophen (TYLENOL) suppository 650 mg (has no administration in time range)  ondansetron (ZOFRAN) tablet 4 mg (has no administration in time range)    Or  ondansetron (ZOFRAN) injection 4 mg (has no administration in time range)  doxycycline (VIBRA-TABS) tablet 100 mg (has no administration in time range)  piperacillin-tazobactam (ZOSYN) IVPB 3.375 g (has no administration in time range)  morphine 4 MG/ML injection 4-6 mg (has no administration in time range)  ondansetron (ZOFRAN) injection 4 mg (4 mg Intravenous Given 03/19/19 0130)  fentaNYL (SUBLIMAZE) injection 50 mcg (50 mcg Intravenous Given 03/19/19 0131)  morphine 4 MG/ML injection 4 mg (4 mg Intravenous Given 03/19/19 0222)  iohexol (OMNIPAQUE) 350 MG/ML injection 100 mL (80 mLs Intravenous Contrast Given 03/19/19 0303)  piperacillin-tazobactam (ZOSYN) IVPB 3.375 g (0 g Intravenous Stopped 03/19/19 0514)    Mobility walks Low fall risk   Focused Assessments     R Recommendations: See Admitting Provider Note  Report given to:   Additional Notes:

## 2019-03-19 NOTE — ED Notes (Signed)
Both sets of blood cultures collected prior to starting antibiotics.

## 2019-03-19 NOTE — Consult Note (Addendum)
Cardiology Consultation:   Patient ID: Gary [name]; [id number]; [birth year]   Admit date: 03/19/2019 Date of Consult: 03/19/2019  Primary Care Provider: Deland Pretty, MD Primary Cardiologist: Gary Mimes, MD  Primary Electrophysiologist:  None   Patient Profile:   Gary [name] is a 78 y.o. male with a hx of pLAD STENT 2002, HTN, HLD, OSA on CPAP, CKD III, GERD, squamous CA, hypothyroid, PAF, PMR, who is being seen today for the preoperative evaluation at the request of Gary [name].  History of Present Illness:   Gary [name] is a 78 year old male with h/o  HTN, HLD, OSA on CPAP, CKD III, GERD, squamous CA, hypothyroid, PAF on chronic Xarelto, CAD, s/p prox LAD stent in 2002, s/p another prox LAD stent in 10/2016 with mild residual CAD by Gary [name]. He has been followed by Gary [name] in [facility] since than. He states that prior to stent placement he experienced chest pain but never since then. He has been dizzy, fatigued, Gary [name] his BP meds with no improvement. He denies any falls, no syncope, no chest pain. He is fully independent, he drives and takes care of household, shops groceries by himself.  He was admitted with back pain, HIDDA scan showed no filling of the gallbladder indicates obstruction of the cystic duct. Findings consistent with acute chelecystitis   Past Medical History:  Diagnosis Date  . Anemia    "chronic anemic"  . Anginal pain ([place])   . Anxiety   . 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 Cancer ([place])    pre-cancerous lesions removed 11/22/12 and hx of  . Cancer ([place]) 10/01/12   R retromolar trigone, squamous cell  . Chronic back pain    hx of  . Chronic kidney disease    renal insufficiency, [place] kidney  . Coronary artery disease    stent placement 2000, does not see cardiologist at this time  . Dyslipidemia   . Full dentures    Edentulous since age [age]  . GERD (gastroesophageal reflux disease)    . Gout    pseudo gout  . Hearing impairment    job related, hbilat hearing aids  . Hepatitis    hepatitis "C"  . History of radiation therapy 03/11/2013   thru -04/23/2013;  right retromolar trigone/54Gy/62fx  . Hypertension    sees Gary. Deland [name]  . Hypertension   . Hypothyroid   . Insomnia    cpap  . Rheumatoid arthritis ([place])    "all over" (09/13/2016)  . Sleep apnea    "tried mask 3 different times; couldn't tolerate it" (09/13/2016)  . Thyroid disease    Hypothyroidism    Past Surgical History:  Procedure Laterality Date  . CARDIAC CATHETERIZATION N/A 09/13/2016   Procedure: Left Heart Cath and Coronary Angiography;  Surgeon: Gary Prows, MD;  Location: [place] [facility];  Service: Cardiovascular;  Laterality: N/A;  . CARDIAC CATHETERIZATION N/A 10/11/2016   Procedure: Coronary Stent Intervention;  Surgeon: Gary Prows, MD;  Location: [place] [facility];  Service: Cardiovascular;  Laterality: N/A;  LAD & 1st Diag  . CATARACT EXTRACTION W/ INTRAOCULAR LENS  IMPLANT, BILATERAL Bilateral 2007  . CORONARY ANGIOPLASTY  09/13/2016  . CORONARY ANGIOPLASTY WITH STENT PLACEMENT  2002  . FRACTURE SURGERY    . HIP PINNING,CANNULATED Left 12/22/2012   Procedure: CANNULATED HIP PINNING;  Surgeon: Gary Rossetti, MD;  Location: [place];  Service: Orthopedics;  Laterality: Left;  [name] MULTIPLE TOOTH EXTRACTIONS  ~ 1962   Edentulous  . RADICAL NECK DISSECTION Right 01/22/2013   [facility]  . SKIN CANCER EXCISION     "face, ears"  . SKIN GRAFT Left ~ 1961   "grafted skin off my stomach & put it on left hand that I had caught in fan belt"  . TONSILLECTOMY      Prior to Admission medications   Medication Sig Start Date End Date Taking? Authorizing Provider  acyclovir (ZOVIRAX) 400 MG tablet Take 400 mg by mouth 2 (two) times daily.  03/01/13  Yes [provider]  aspirin EC 81 MG tablet Take 81 mg by mouth daily.   Yes [provider]  atorvastatin (LIPITOR) 40 MG  tablet Take 40 mg by mouth 2 (two) times daily.  09/09/16  Yes [provider]  Calcium Carbonate-Vitamin D (CALCIUM-D) 600-400 MG-UNIT TABS Take 1 tablet by mouth daily.    Yes [provider]  EPINEPHrine 0.3 mg/0.3 mL IJ SOAJ injection Inject 0.3 mg into the muscle as directed.  05/01/14  Yes [provider]  magnesium oxide (MAG-OX) 400 MG tablet Take 400 mg by mouth at bedtime.   Yes [provider]  meclizine (ANTIVERT) 25 MG tablet Take 25 mg by mouth every 12 (twelve) hours as needed for dizziness.    Yes [provider]  mirtazapine (REMERON) 15 MG tablet Take 7.5 mg by mouth at bedtime.    Yes [provider]  Multiple Vitamin (MULTIVITAMIN WITH MINERALS) TABS tablet Take 1 tablet by mouth daily.   Yes [provider]  omega-3 acid ethyl esters (LOVAZA) 1 G capsule Take 1 g by mouth daily.    Yes [provider]  oxyCODONE (ROXICODONE) 15 MG immediate release tablet Take 15 mg by mouth 4 (four) times daily as needed for pain.    Yes [provider]  pantoprazole (PROTONIX) 40 MG tablet Take 40 mg by mouth 2 (two) times daily. 01/14/19  Yes [provider]  Polyethyl Glycol-Propyl Glycol (SYSTANE) 0.4-0.3 % GEL ophthalmic gel Place 1 application into both eyes as needed (dry eyes).   Yes [provider]  predniSONE (DELTASONE) 5 MG tablet Take 5 mg by mouth at bedtime.    Yes [provider]  propranolol (INDERAL) 10 MG tablet Take 10 mg by mouth 2 (two) times daily.   Yes [provider]  sertraline (ZOLOFT) 100 MG tablet Take 100 mg by mouth at bedtime.  11/20/12  Yes [provider]  XARELTO 10 MG TABS tablet Take 10 mg by mouth daily. 01/03/19  Yes [provider]  clobetasol (TEMOVATE) 0.05 % external solution Apply 1 application topically as needed. psoriasis on head 03/18/19   [provider]  diazepam (VALIUM) 5 MG tablet Take 1 tablet (5 mg total)  by mouth every 12 (twelve) hours as needed for anxiety. For anxiety Patient taking differently: Take 5 mg by mouth at bedtime as needed (sleep). For anxiety 12/25/12   Barton Dubois, MD  levothyroxine (SYNTHROID, LEVOTHROID) 112 MCG tablet Take 112 mcg daily before breakfast by mouth.    [provider]  lisinopril (ZESTRIL) 5 MG tablet Take 5 mg by mouth daily. 02/12/19   [provider]   Inpatient Medications: Scheduled Meds: . [START ON 03/20/2019] atorvastatin  40 mg Oral q1800  . doxycycline  100 mg Oral Q12H  . [START ON 03/20/2019] levothyroxine  112 mcg Oral QAC breakfast  . mirtazapine  7.5 mg Oral QHS  . multivitamin with minerals  1 tablet Oral Daily  . omega-3 acid ethyl esters  1 g Oral Daily  . predniSONE  5 mg Oral QHS  . sertraline  100 mg Oral QHS   Continuous Infusions: . sodium chloride 125 mL/hr at 03/19/19 0342  . piperacillin-tazobactam (ZOSYN)  IV     PRN Meds: acetaminophen **OR** acetaminophen, diazepam, meclizine, morphine injection, ondansetron **OR** ondansetron (ZOFRAN) IV, oxyCODONE  Allergies:    Allergies  Allergen Reactions  . Bee Venom Swelling  . Cellcept [Mycophenolate Mofetil] Other (See Comments)    Reaction unknown  . Colcrys [Colchicine] Other (See Comments)    myalgias  . Erythromycin Other (See Comments)    Reaction unknown  . Methotrexate Derivatives Hives, Itching and Rash    Only in tablet form  . Neosporin [Neomycin-Bacitracin Zn-Polymyx] Rash  . Trazodone Palpitations    Weakness, loss of appetite  . Ultram [Tramadol] Rash    Social History:   Social History   Socioeconomic History  . Marital status: Widowed    Spouse name: Not on file  . Number of children: 2  . Years of education: Not on file  . Highest education level: Not on file  Occupational History  . Occupation: Retired    Fish farm manager: RETIRED    Comment: Engineer, building services  Social Needs  . Financial resource strain: Not on file  . Food insecurity:     Worry: Not on file    Inability: Not on file  . Transportation needs:    Medical: Not on file    Non-medical: Not on file  Tobacco Use  . Smoking status: Former Smoker    Packs/day: 1.00    Years: 27.00    Pack years: 27.00    Types: Cigarettes    Last attempt to quit: 11/08/1983    Years since quitting: 35.3  . Smokeless tobacco: Never Used  Substance and Sexual Activity  . Alcohol use: Yes    Comment: 09/13/2016 "nothing since 12/2012"  . Drug use: No  . Sexual activity: Not on file  Lifestyle  . Physical activity:    Days per week: Not on file    Minutes per session: Not on file  . Stress: Not on file  Relationships  . Social connections:    Talks on phone: Not on file    Gets together: Not on file    Attends religious service: Not on file    Active member of club or organization: Not on file    Attends meetings of clubs or organizations: Not on file    Relationship status: Not on file  . Intimate partner violence:    Fear of current or ex partner: Not on file    Emotionally abused: Not on file    Physically abused: Not on file    Forced sexual activity: Not on file  Other Topics Concern  . Not on file  Social History Narrative  . Not on file    Family History:   Family History  Problem Relation Age of Onset  . Coronary artery disease Brother 59  . Heart attack Brother   . Coronary artery disease Father        stents in 70's  . Cancer Mother        bone   Family Status:  Family Status  Relation Name Status  . Brother  Deceased  . Father  Deceased at age 32       GI  infection  . Mother  Deceased at age 82       Cancer  . Sister  Alive   ROS:  Please see the history of present illness.  All other ROS reviewed and negative.     Physical Exam/Data:   Vitals:   03/19/19 0332 03/19/19 0400 03/19/19 0618 03/19/19 2152  BP: (!) 165/90 (!) 157/88 (!) 158/107 (!) 142/87  Pulse: 70 69 75 81  Resp: 16  15 18   Temp:    98.5 F (36.9 C)  TempSrc:     Oral  SpO2: 98% 94% 100% 96%  Weight:      Height:        Intake/Output Summary (Last 24 hours) at 03/19/2019 2202 Last data filed at 03/19/2019 1910 Gross per 24 hour  Intake 1860 ml  Output 750 ml  Net 1110 ml   Filed Weights   03/19/19 0024  Weight: 77.1 kg   Body mass index is 21.25 kg/m.  General:  Well nourished, well developed, in no acute distress HEENT: normal Lymph: no adenopathy Neck: no JVD Endocrine:  No thryomegaly Vascular: No carotid bruits; 4/4 extremity pulses 2+, without bruits  Cardiac:  normal S1, S2; RRR; no murmur  Lungs:  clear to auscultation bilaterally, no wheezing, rhonchi or rales  Abd: soft, nontender, no hepatomegaly  Ext: no edema Musculoskeletal:  No deformities, BUE and BLE strength normal and equal Skin: warm and dry  Neuro:  CNs 2-12 intact, no focal abnormalities noted Psych:  Normal affect   EKG:  The EKG was personally reviewed and demonstrates:  05/12, Sinus brady, HR 57, V6 not seen, no acute ischemic changes Telemetry:  Telemetry was personally reviewed and demonstrates: Not available for review  Relevant CV Studies:  ECHO: 12/24/2012 - Left ventricle: The cavity size was normal. There was mild concentric hypertrophy. Systolic function was normal. The estimated ejection fraction was in the range of 55% to 60%. Wall motion was normal; there were no regional wall motion abnormalities. Doppler parameters are consistent with abnormal left ventricular relaxation (grade 1 diastolic dysfunction). - Left atrium: The atrium was normal in size. - Tricuspid valve: Mild regurgitation. - Pulmonary arteries: PA peak pressure: 34 mmHg + right atrial pressure (S).  CATH:  STAGED CORONARY STENT INTERVENTION: 10/11/2016 The obtuse marginal balloon angioplasty site performed on 09/13/2016 is widely patent except at the ostium which shows a 30 at most 40% restenosis.  Successful PTCA and stenting of the mid LAD with  implantation of a 2.5 x 12 mm Onyx DES.  Successful PTCA and bifurcation stenting of LAD-D1 with 3.0x2.5 [place] into the proximal LAD and D1 and native LAD in the proximal and midsegment was stented with a 3.0 x 15 mm Onyx DES. Distal LAD stenosis reduced from 80% to 0%, proximal to mid LAD stenosis reduced from 90% to 0%, D1 stenosis reduced from 80% to 0% with maintenance of TIMI-3 to TIMI-3 flow.  Patient received 30.6 minutes of fluoroscopy time, 0.6 in excess of safety, hence will be monitored for any radiation injury patient will also be educated about radiation injury. A total of 220 mL contrast utilized.  Recommendation: He will need aggressive risk factor modification. He'll be continued on Xarelto along with Plavix for atrial fibrillation and CAD/DES stenting. Careful watch on his bleeding complications would performed. He'll be discharged home in the morning. Diagnostic  Dominance: Right    Intervention      Coronary angiogram 09/13/2016:  1. Normal LV systolic function EF 60-10%.  No wall motion abnormality. 2. Left main normal, proximal LAD stent widely patent. Just distal to the stent, at the bifurcation of D1, there is a 80-85% stenosis. Diagonal one is fairly large and the stent jailed and has a at most 40% stenosis. Mid to distal segment of the LAD has a 80-85% stenosis. 3. Moderate to large circumflex which is codominant. OM 2 has a ostial 95% stenosis and a tandem 99% stenosis. S/P balloon angioplasty with 2.5 mm balloon, ostium less than 10% and proximal 0% residual stenosis. 4. Codominant RCA, distal RCA diffuse 50% stenosis. 5. S/P PTCA OM  Laboratory Data:  Chemistry Recent Labs  Lab 03/19/19 0043  NA 138  K 3.7  CL 102  CO2 27  GLUCOSE 158*  BUN 14  CREATININE 1.49*  CALCIUM 9.1  GFRNONAA 44*  GFRAA 51*  ANIONGAP 9    Lab Results  Component Value Date   ALT 20 03/19/2019   AST 39 03/19/2019   ALKPHOS 89 03/19/2019   BILITOT 0.5 03/19/2019    Hematology Recent Labs  Lab 03/19/19 0043  WBC 6.6  RBC 3.80*  HGB 12.1*  HCT 38.2*  MCV 100.5*  MCH 31.8  MCHC 31.7  RDW 14.7  PLT 226   TSH:  Lab Results  Component Value Date   TSH 0.079 (L) 03/26/2013   Radiology/Studies:  Nm Hepato W/eject Fract  Result Date: 03/19/2019 CLINICAL DATA:  RIGHT upper quadrant pain. Cholelithiasis on ultrasound. Pericholecystic fluid on CT examination. EXAM: NUCLEAR MEDICINE HEPATOBILIARY IMAGING TECHNIQUE: Sequential images of the abdomen were obtained out to 60 minutes following intravenous administration of radiopharmaceutical. 3 mg IV morphine RADIOPHARMACEUTICALS:  5.5 mCi Tc-27m Choletec IV (booster dose of 1.1 millicurie Choletec administered after 90 minutes) COMPARISON:  CT 03/19/2019, ultrasound 03/19/2019 FINDINGS: There is prompt clearance radiotracer from the blood pool and homogeneous uptake in the liver. Counts are evident in the common bile duct and small bowel by 30 minutes. The gallbladder is not visualized at 90 minutes. At 90 minutes, IV morphine was administered to contract the sphincter of Oddi. 30 minutes of imaging following IV administration of morphine fail to demonstrate filling of the gallbladder. IMPRESSION: Non filling of the gallbladder indicates obstruction of the cystic duct. Findings consistent with ACUTE CHOLECYSTITIS. These results will be called to the ordering clinician or representative by the Radiologist Assistant, and communication documented in the PACS or zVision Dashboard. Electronically Signed   By: Suzy Bouchard M.D.   On: 03/19/2019 15:11   Dg Chest Port 1 View  Result Date: 03/19/2019 CLINICAL DATA:  Nausea and vomiting with chest pain EXAM: PORTABLE CHEST 1 VIEW COMPARISON:  Chest CT 04/21/2015 FINDINGS: The heart size and mediastinal contours are within normal limits. Both lungs are clear. The visualized skeletal structures are unremarkable. IMPRESSION: No active disease. Electronically Signed   By:  Ulyses Jarred M.D.   On: 03/19/2019 01:17   Ct Angio Chest/abd/pel For Dissection W And/or Wo Contrast  Result Date: 03/19/2019 CLINICAL DATA:  78 year old male with abdominal pain and vomiting. Concern for aortic dissection. History of squamous cell carcinoma of the head and neck. EXAM: CT ANGIOGRAPHY CHEST, ABDOMEN AND PELVIS TECHNIQUE: Multidetector CT imaging through the chest, abdomen and pelvis was performed using the standard protocol during bolus administration of intravenous contrast. Multiplanar reconstructed images and MIPs were obtained and reviewed to evaluate the vascular anatomy. CONTRAST:  38mL OMNIPAQUE IOHEXOL 350 MG/ML SOLN COMPARISON:  Chest radiograph dated 03/19/2019 and CT dated 03/21/2015 FINDINGS: CTA CHEST  FINDINGS Cardiovascular: There is no cardiomegaly or pericardial effusion. Advanced coronary vascular calcification of the LAD. Minimal atherosclerotic calcification of the thoracic aorta. No aneurysmal dilatation or dissection. The origins of the great vessels of the aortic arch appear patent. No pulmonary artery embolus identified. Mediastinum/Nodes: Right hilar calcified lymph nodes. No mediastinal adenopathy. There is an area of dilatation of the upper esophagus which may represent a diverticulum. No mediastinal fluid collection. Lungs/Pleura: There is a cluster of nodular density in the right upper lobe concerning for an infectious process such as atypical infection. There is an 8 mm nodular density in the right lower lobe (series 9 image 99) which may represent inflammatory or infectious in etiology although metastatic disease is not entirely excluded. Clinical correlation and follow-up recommended. There are bibasilar dependent atelectatic changes and scarring. No lobar consolidation, pleural effusion, or pneumothorax. The central airways are patent. Musculoskeletal: There is osteopenia with degenerative changes of the spine. No acute osseous pathology. Old right posterior 12  rib fracture. Review of the MIP images confirms the above findings. CTA ABDOMEN AND PELVIS FINDINGS VASCULAR Aorta: Mild atherosclerotic disease. No aneurysmal dilatation or dissection. The aorta is mildly tortuous. Celiac: Patent without evidence of aneurysm, dissection, vasculitis or significant stenosis. SMA: Atherosclerotic calcification. The SMA is patent. Renals: There is atherosclerotic calcification of the renal ostia. The renal arteries remain patent. IMA: Patent without evidence of aneurysm, dissection, vasculitis or significant stenosis. 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 The iliac arteries are otherwise patent. Veins: No obvious venous abnormality within the limitations of this arterial phase study. No portal venous gas. Review of the MIP images confirms the above findings. NON-VASCULAR No intra-abdominal free air or free fluid. Hepatobiliary: Apparent fatty infiltration of the liver. No intrahepatic biliary ductal dilatation. There is a calcified stone within the gallbladder. The gallbladder is distended. A stone is noted at the gallbladder neck. There is inflammatory changes and haziness of the gallbladder wall. Findings most consistent with an acute cholecystitis. Further evaluation with ultrasound recommended. Pancreas: Unremarkable. No pancreatic ductal dilatation or surrounding inflammatory changes. Spleen: Normal in size without focal abnormality. Adrenals/Urinary Tract: The adrenal glands are unremarkable. Mild bilateral renal parenchyma atrophy with areas of cortical scarring in the inferior pole of the right kidney. There is no hydronephrosis on either side. The visualized ureters and urinary bladder appear unremarkable. Stomach/Bowel: There is sigmoid diverticulosis without active inflammatory changes. No bowel obstruction or active inflammation. Normal appendix. Lymphatic: No adenopathy. Reproductive: Dystrophic  calcification of the prostate gland. The seminal vesicles are symmetric. Other: None Musculoskeletal: Osteopenia. No acute osseous pathology. Left femoral neck fixation screws. Review of the MIP images confirms the above findings. IMPRESSION: 1. No aortic aneurysm or dissection. No pulmonary artery embolus identified. 2. Cholelithiasis with findings of acute cholecystitis. Further evaluation with ultrasound recommended. 3. Cluster of nodular densities in the right upper lobe concerning for an infectious process such as atypical pneumonia. Clinical correlation and follow-up recommended. 4. An 8 mm right lower lobe nodular density is also likely infectious in etiology. Non-contrast chest CT at 6-12 months is recommended. If the nodule is stable at time of repeat CT, then future CT at 18-24 months (from today's scan) is considered optional for low-risk patients, but is recommended for high-risk patients. This recommendation follows the consensus statement: Guidelines for Management of Incidental Pulmonary Nodules Detected on CT Images: From the Fleischner Society 2017; Radiology 2017; 284:228-243. 5. 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 6. Sigmoid diverticulosis. No bowel obstruction or active inflammation. Normal appendix. Electronically Signed   By: Anner Crete M.D.   On: 03/19/2019 03:54   US Abdomen Limited Ruq  Result Date: 03/19/2019 CLINICAL DATA:  78 year old male with right upper quadrant pain and suspicion of cholelithiasis and cholecystitis on CTA Chest, Abdomen, and Pelvis earlier today EXAM: ULTRASOUND ABDOMEN LIMITED RIGHT UPPER QUADRANT COMPARISON:  CTA Chest, Abdomen, and Pelvis earlier today. FINDINGS: Gallbladder: On some images the gallbladder wall appears normal, but on image 6 there is thickening at the fundus of 5-6 millimeters. Echogenic partially shadowing cholelithiasis identified (image 11). There is also dependent sludge within the  gallbladder. Gallstones size estimated at 8 millimeters. No sonographic Murphy sign elicited. Common bile duct: Diameter: 5 millimeters, normal. Liver: Echogenic liver (image 22). No discrete liver lesion. No intrahepatic biliary ductal dilatation. Portal vein is patent on color Doppler imaging with normal direction of blood flow towards the liver. Other findings: Negative visible right kidney. IMPRESSION: 1. Focal gallbladder wall thickening with sludge and gallstones. Together with the earlier CTA appearance this is suspicious for Acute Cholecystitis. 2. CBD remains within normal limits.  Fatty liver disease. Electronically Signed   By: Genevie Ann M.D.   On: 03/19/2019 05:12    Assessment and Plan:   1. Preop cardiovascular evaluation:  - pt has been following with Gary [name] in [facility] - he has been free of chest pain and has no CHF symptoms - in 2017, no obstructive residual CAD after PCI, no new symptoms - the patient can achieve at least 4 METS - ECG shows SR, otherwise normal ECG unchanged from prior - the patient is considered intermediate risk for a moderate risk surgery, however currently doesn't have any contraindications for the planned surgery, no testing is required prior to testing  2. CAD - continue ASA, atorvastatin  3. Hypertension - elevated BP most probably sec to pain - uptitrate lisinopril post surgery as needed  4. Paroxysmal atrial fibrillation - currently in SR - restart Xarelto as soon as safe from surgical/bleeding standpoint  Principal Problem:   Acute cholecystitis Active Problems:   HTN (hypertension)   RA (rheumatoid arthritis) ([place])   CAD, LAD stent 2002   PAF, new Feb 2014 with SSS component   Chronic anticoagulation, new Feb 2014- Xarelto   Pulmonary nodule   CAP (community acquired pneumonia)     For questions or updates, please contact [facility] Please consult [email or web address] for contact info under Cardiology/STEMI.   Signed, Ena Dawley, MD  03/19/2019 10:02 PM

## 2019-03-19 NOTE — Consult Note (Signed)
Surgical Consultation Requesting provider: Dr. Lupita Leash  CC: back pain  HPI: Very nice 78yo man on xarelto (last dose Sunday evening) and with multiple severe comorbidities as listed below who developed severe mid back pain (he has diffuse arthritis and chronically has pain but this was much worse) and one episode of diarrhea about an hour after eating [employer] grilled chicken for dinner last night. He then developed some upper stomach pain and felt he had food poisoning so tried to vomit which he was able to do. The pain persisted and so he called EMS. His workup in the ER as noted below, and surgery is consulted regarding possible cholecystectomy.   The patient is very reluctant about having to undergo any surgery. Denies any prior abdominal operations.   Allergies  Allergen Reactions  . Bee Venom Swelling  . Cellcept [Mycophenolate Mofetil] Other (See Comments)    Reaction unknown  . Colcrys [Colchicine] Other (See Comments)    myalgias  . Erythromycin Other (See Comments)    Reaction unknown  . Methotrexate Derivatives Hives, Itching and Rash    Only in tablet form  . Neosporin [Neomycin-Bacitracin Zn-Polymyx] Rash  . Trazodone Palpitations    Weakness, loss of appetite  . Ultram [Tramadol] Rash    Past Medical History:  Diagnosis Date  . Anemia    "chronic anemic"  . Anginal pain ([place])   . Anxiety   . 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 Cancer ([place])    pre-cancerous lesions removed 11/22/12 and hx of  . 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 Chronic back pain    hx of  . Chronic kidney disease    renal insufficiency, [place] kidney  . Coronary artery disease    stent placement 2000, does not see cardiologist at this time  . Dyslipidemia   . Full dentures    Edentulous since age [age]  . GERD (gastroesophageal reflux disease)   . Gout    pseudo gout  . Hearing impairment    job related, hbilat hearing aids  .  Hepatitis    hepatitis "C"  . History of radiation therapy 03/11/2013-04/23/2013   right retromolar trigone/54Gy/13fx  . Hypertension    sees Dr. Deland Pretty  . Hypertension   . Hypothyroid   . Insomnia    cpap  . Rheumatoid arthritis ([place])    "all over" (09/13/2016)  . Sleep apnea    "tried mask 3 different times; couldn't tolerate it" (09/13/2016)  . Thyroid disease    Hypothyroidism    Past Surgical History:  Procedure Laterality Date  . CARDIAC CATHETERIZATION N/A 09/13/2016   Procedure: Left Heart Cath and Coronary Angiography;  Surgeon: Adrian Prows, MD;  Location: [facility];  Service: Cardiovascular;  Laterality: N/A;  . CARDIAC CATHETERIZATION N/A 10/11/2016   Procedure: Coronary Stent Intervention;  Surgeon: Adrian Prows, MD;  Location: [facility];  Service: Cardiovascular;  Laterality: N/A;  LAD & 1st Diag  . CATARACT EXTRACTION W/ INTRAOCULAR LENS  IMPLANT, BILATERAL Bilateral 2007  . CORONARY ANGIOPLASTY  09/13/2016  . CORONARY ANGIOPLASTY WITH STENT PLACEMENT  2002  . FRACTURE SURGERY    . HIP PINNING,CANNULATED Left 12/22/2012   Procedure: CANNULATED HIP PINNING;  Surgeon: Mcarthur Rossetti, MD;  Location: [facility];  Service: Orthopedics;  Laterality: Left;  [name] MULTIPLE TOOTH EXTRACTIONS  ~ 1962   Edentulous  . RADICAL NECK DISSECTION Right 01/22/2013  [facility]  . SKIN CANCER EXCISION     "face, ears"  . SKIN GRAFT Left ~ 1961   "grafted skin off my stomach & put it on left hand that I had caught in fan belt"  . TONSILLECTOMY      Family History  Problem Relation Age of Onset  . Coronary artery disease Brother 3  . Heart attack Brother   . Coronary artery disease Father        stents in 70's  . Cancer Mother        bone    Social History   Socioeconomic History  . Marital status: Widowed    Spouse name: Not on file  . Number of children: 2  . Years of education: Not on file  . Highest education level: Not on file  Occupational History   . Occupation: Retired    Fish farm manager: RETIRED    Comment: Engineer, building services  Social Needs  . Financial resource strain: Not on file  . Food insecurity:    Worry: Not on file    Inability: Not on file  . Transportation needs:    Medical: Not on file    Non-medical: Not on file  Tobacco Use  . Smoking status: Former Smoker    Packs/day: 1.00    Years: 27.00    Pack years: 27.00    Types: Cigarettes    Last attempt to quit: 11/08/1983    Years since quitting: 35.3  . Smokeless tobacco: Never Used  Substance and Sexual Activity  . Alcohol use: Yes    Comment: 09/13/2016 "nothing since 12/2012"  . Drug use: No  . Sexual activity: Not on file  Lifestyle  . Physical activity:    Days per week: Not on file    Minutes per session: Not on file  . Stress: Not on file  Relationships  . Social connections:    Talks on phone: Not on file    Gets together: Not on file    Attends religious service: Not on file    Active member of club or organization: Not on file    Attends meetings of clubs or organizations: Not on file    Relationship status: Not on file  Other Topics Concern  . Not on file  Social History Narrative  . Not on file    No current facility-administered medications on file prior to encounter.    Current Outpatient Medications on File Prior to Encounter  Medication Sig Dispense Refill  . acyclovir (ZOVIRAX) 400 MG tablet Take 400 mg by mouth 2 (two) times daily.     [name] aspirin EC 81 MG tablet Take 81 mg by mouth daily.    [name] atorvastatin (LIPITOR) 40 MG tablet Take 40 mg by mouth 2 (two) times daily.     . Calcium Carbonate-Vitamin D (CALCIUM-D) 600-400 MG-UNIT TABS Take 1 tablet by mouth daily.     [name] EPINEPHrine 0.3 mg/0.3 mL IJ SOAJ injection Inject 0.3 mg into the muscle as directed.     . magnesium oxide (MAG-OX) 400 MG tablet Take 400 mg by mouth at bedtime.    . meclizine (ANTIVERT) 25 MG tablet Take 25 mg by mouth every 12 (twelve) hours as needed for dizziness.      . mirtazapine (REMERON) 15 MG tablet Take 7.5 mg by mouth at bedtime.     . Multiple Vitamin (MULTIVITAMIN WITH MINERALS) TABS tablet Take 1 tablet by mouth daily.    [name] omega-3 acid ethyl esters (  LOVAZA) 1 G capsule Take 1 g by mouth daily.     [name] oxyCODONE (ROXICODONE) 15 MG immediate release tablet Take 15 mg by mouth 4 (four) times daily as needed for pain.     . pantoprazole (PROTONIX) 40 MG tablet Take 40 mg by mouth 2 (two) times daily.    Vladimir Faster Glycol-Propyl Glycol (SYSTANE) 0.4-0.3 % GEL ophthalmic gel Place 1 application into both eyes as needed (dry eyes).    . predniSONE (DELTASONE) 5 MG tablet Take 5 mg by mouth at bedtime.     . propranolol (INDERAL) 10 MG tablet Take 10 mg by mouth 2 (two) times daily.    . sertraline (ZOLOFT) 100 MG tablet Take 100 mg by mouth at bedtime.     Alveda Reasons 10 MG TABS tablet Take 10 mg by mouth daily.    . clobetasol (TEMOVATE) 0.05 % external solution Apply 1 application topically as needed. 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 diazepam (VALIUM) 5 MG tablet Take 1 tablet (5 mg total) by mouth every 12 (twelve) hours as needed for anxiety. For anxiety (Patient taking differently: Take 5 mg by mouth at bedtime as needed (sleep). For anxiety) 30 tablet 0  . hydrALAZINE (APRESOLINE) 50 MG tablet Take 1 tablet (50 mg total) by mouth 3 (three) times daily. (Patient not taking: Reported on 03/19/2019) 90 tablet 1  . levothyroxine (SYNTHROID, LEVOTHROID) 112 MCG tablet Take 112 mcg daily before breakfast by mouth.    [name] lisinopril (ZESTRIL) 5 MG tablet Take 5 mg by mouth daily.    . Rivaroxaban (XARELTO) 15 MG TABS tablet Take 1 tablet (15 mg total) by mouth daily with supper. (Patient not taking: Reported on 03/19/2019) 30 tablet 6    Review of Systems: a complete, 10pt review of systems was completed with pertinent positives and negatives as documented in the HPI  Physical Exam: Vitals:   03/19/19 0400 03/19/19 0618  BP: (!) 157/88 (!) 158/107  Pulse: 69 75   Resp:  15  Temp:    SpO2: 94% 100%   Gen: A&Ox3, no distress.  Head: normocephalic, atraumatic. Edentulous. Hearing aids.  Eyes: extraocular motions intact, anicteric.  Neck: supple without mass or thyromegaly Chest: unlabored respirations, symmetrical air entry Cardiovascular: RRR Abdomen: soft, nondistended, nontender. No mass or organomegaly. Deep palpation of the RUQ elicits no tenderness whatsoever.  Extremities: warm, without edema, no deformities  Neuro: grossly intact Psych: appropriate mood and affect, normal insight  Skin: warm and dry   CBC Latest Ref Rng & Units 03/19/2019 10/12/2016 09/14/2016  WBC 4.0 - 10.5 K/uL 6.6 6.1 6.5  Hemoglobin 13.0 - 17.0 g/dL 12.1(L) 12.2(L) 12.8(L)  Hematocrit 39.0 - 52.0 % 38.2(L) 36.3(L) 39.0  Platelets 150 - 400 K/uL 226 171 169    CMP Latest Ref Rng & Units 03/19/2019 10/12/2016 10/11/2016  Glucose 70 - 99 mg/dL 158(H) 106(H) 95  BUN 8 - 23 mg/dL 14 12 12   Creatinine 0.61 - 1.24 mg/dL 1.49(H) 1.77(H) 1.79(H)  Sodium 135 - 145 mmol/L 138 139 140  Potassium 3.5 - 5.1 mmol/L 3.7 3.5 3.5  Chloride 98 - 111 mmol/L 102 104 104  CO2 22 - 32 mmol/L 27 26 29   Calcium 8.9 - 10.3 mg/dL 9.1 8.6(L) 9.2  Total Protein 6.5 - 8.1 g/dL 6.8 - -  Total Bilirubin 0.3 - 1.2 mg/dL 0.5 - -  Alkaline Phos 38 - 126 U/L 89 - -  AST 15 - 41 U/L 39 - -  ALT  0 - 44 U/L 20 - -    Lab Results  Component Value Date   INR 1.1 03/19/2019   INR 1.05 09/13/2016   INR 1.18 03/26/2013    Imaging: Dg Chest Port 1 View  Result Date: 03/19/2019 CLINICAL DATA:  Nausea and vomiting with chest pain EXAM: PORTABLE CHEST 1 VIEW COMPARISON:  Chest CT 04/21/2015 FINDINGS: The heart size and mediastinal contours are within normal limits. Both lungs are clear. The visualized skeletal structures are unremarkable. IMPRESSION: No active disease. Electronically Signed   By: Ulyses Jarred M.D.   On: 03/19/2019 01:17   Ct Angio Chest/abd/pel For Dissection W And/or Wo  Contrast  Result Date: 03/19/2019 CLINICAL DATA:  77 year old male with abdominal pain and vomiting. Concern for aortic dissection. History of squamous cell carcinoma of the head and neck. EXAM: CT ANGIOGRAPHY CHEST, ABDOMEN AND PELVIS TECHNIQUE: Multidetector CT imaging through the chest, abdomen and pelvis was performed using the standard protocol during bolus administration of intravenous contrast. Multiplanar reconstructed images and MIPs were obtained and reviewed to evaluate the vascular anatomy. CONTRAST:  25mL OMNIPAQUE IOHEXOL 350 MG/ML SOLN COMPARISON:  Chest radiograph dated 03/19/2019 and CT dated 03/21/2015 FINDINGS: CTA CHEST FINDINGS Cardiovascular: There is no cardiomegaly or pericardial effusion. Advanced coronary vascular calcification of the LAD. Minimal atherosclerotic calcification of the thoracic aorta. No aneurysmal dilatation or dissection. The origins of the great vessels of the aortic arch appear patent. No pulmonary artery embolus identified. Mediastinum/Nodes: Right hilar calcified lymph nodes. No mediastinal adenopathy. There is an area of dilatation of the upper esophagus which may represent a diverticulum. No mediastinal fluid collection. Lungs/Pleura: There is a cluster of nodular density in the right upper lobe concerning for an infectious process such as atypical infection. There is an 8 mm nodular density in the right lower lobe (series 9 image 99) which may represent inflammatory or infectious in etiology although metastatic disease is not entirely excluded. Clinical correlation and follow-up recommended. There are bibasilar dependent atelectatic changes and scarring. No lobar consolidation, pleural effusion, or pneumothorax. The central airways are patent. Musculoskeletal: There is osteopenia with degenerative changes of the spine. No acute osseous pathology. Old right posterior 12 rib fracture. Review of the MIP images confirms the above findings. CTA ABDOMEN AND PELVIS  FINDINGS VASCULAR Aorta: Mild atherosclerotic disease. No aneurysmal dilatation or dissection. The aorta is mildly tortuous. Celiac: Patent without evidence of aneurysm, dissection, vasculitis or significant stenosis. SMA: Atherosclerotic calcification. The SMA is patent. Renals: There is atherosclerotic calcification of the renal ostia. The renal arteries remain patent. IMA: Patent without evidence of aneurysm, dissection, vasculitis or significant stenosis. 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 The iliac arteries are otherwise patent. Veins: No obvious venous abnormality within the limitations of this arterial phase study. No portal venous gas. Review of the MIP images confirms the above findings. NON-VASCULAR No intra-abdominal free air or free fluid. Hepatobiliary: Apparent fatty infiltration of the liver. No intrahepatic biliary ductal dilatation. There is a calcified stone within the gallbladder. The gallbladder is distended. A stone is noted at the gallbladder neck. There is inflammatory changes and haziness of the gallbladder wall. Findings most consistent with an acute cholecystitis. Further evaluation with ultrasound recommended. Pancreas: Unremarkable. No pancreatic ductal dilatation or surrounding inflammatory changes. Spleen: Normal in size without focal abnormality. Adrenals/Urinary Tract: The adrenal glands are unremarkable. Mild bilateral renal parenchyma atrophy with areas of cortical scarring in the inferior pole  of the right kidney. There is no hydronephrosis on either side. The visualized ureters and urinary bladder appear unremarkable. Stomach/Bowel: There is sigmoid diverticulosis without active inflammatory changes. No bowel obstruction or active inflammation. Normal appendix. Lymphatic: No adenopathy. Reproductive: Dystrophic calcification of the prostate gland. The seminal vesicles are symmetric. Other: None  Musculoskeletal: Osteopenia. No acute osseous pathology. Left femoral neck fixation screws. Review of the MIP images confirms the above findings. IMPRESSION: 1. No aortic aneurysm or dissection. No pulmonary artery embolus identified. 2. Cholelithiasis with findings of acute cholecystitis. Further evaluation with ultrasound recommended. 3. Cluster of nodular densities in the right upper lobe concerning for an infectious process such as atypical pneumonia. Clinical correlation and follow-up recommended. 4. An 8 mm right lower lobe nodular density is also likely infectious in etiology. Non-contrast chest CT at 6-12 months is recommended. If the nodule is stable at time of repeat CT, then future CT at 18-24 months (from today's scan) is considered optional for low-risk patients, but is recommended for high-risk patients. This recommendation follows the consensus statement: Guidelines for Management of Incidental Pulmonary Nodules Detected on CT Images: From the Fleischner Society 2017; Radiology 2017; 284:228-243. 5. 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 6. Sigmoid diverticulosis. No bowel obstruction or active inflammation. Normal appendix. Electronically Signed   By: Anner Crete M.D.   On: 03/19/2019 03:54   US Abdomen Limited Ruq  Result Date: 03/19/2019 CLINICAL DATA:  78 year old male with right upper quadrant pain and suspicion of cholelithiasis and cholecystitis on CTA Chest, Abdomen, and Pelvis earlier today EXAM: ULTRASOUND ABDOMEN LIMITED RIGHT UPPER QUADRANT COMPARISON:  CTA Chest, Abdomen, and Pelvis earlier today. FINDINGS: Gallbladder: On some images the gallbladder wall appears normal, but on image 6 there is thickening at the fundus of 5-6 millimeters. Echogenic partially shadowing cholelithiasis identified (image 11). There is also dependent sludge within the gallbladder. Gallstones size estimated at 8 millimeters. No sonographic Murphy sign  elicited. Common bile duct: Diameter: 5 millimeters, normal. Liver: Echogenic liver (image 22). No discrete liver lesion. No intrahepatic biliary ductal dilatation. Portal vein is patent on color Doppler imaging with normal direction of blood flow towards the liver. Other findings: Negative visible right kidney. IMPRESSION: 1. Focal gallbladder wall thickening with sludge and gallstones. Together with the earlier CTA appearance this is suspicious for Acute Cholecystitis. 2. CBD remains within normal limits.  Fatty liver disease. Electronically Signed   By: Genevie Ann M.D.   On: 03/19/2019 05:12      A/P:  Patient Active Problem List   Diagnosis Date Noted  . Acute cholecystitis 03/19/2019  . Post PTCA 09/13/2016  . Exposed mandible 05/20/2014  . . 03/26/2013  . CAP (community acquired pneumonia) 03/26/2013  . COPD with acute exacerbation ([place]) 03/26/2013  . Pulmonary nodule 01/06/2013  . Diastolic dysfunction, with good LVF 2D Feb 2014 12/25/2012  . Chronic anticoagulation, new Feb 2014- Xarelto 12/25/2012  . PAF, new Feb 2014 with SSS component 12/24/2012  . 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 Hip fracture, left, S/P surgical repair 12/22/12 12/21/2012  . Coronary artery disease   . HTN (hypertension)   . Anxiety   . Sleep apnea   . Chronic kidney disease. stage 2   . GERD (gastroesophageal reflux disease)   . RA (rheumatoid arthritis) ([place])   . Gout   . Anemia   . Hepatitis   . Chronic back pain   . Dyslipidemia   .  Hypothyroid   . CAD, LAD stent 2002      78yo man with multiple medical problems as listed above.  Back pain, emesis and diarrhea x 1. Labs normal. Exam is not consistent with cholecystitis and imaging is equivocal.  -HIDA ordered. If positive, will need cardiac clearance prior to surgery.    Romana Juniper, MD [facility], [place] Pager [phone]

## 2019-03-19 NOTE — ED Triage Notes (Signed)
Pt bib EMS and coming from home.  Pt ate grilled chicken and mashed potatoes from [facility] at around 6pm.  At about 7pm, pt began experiencing n/v/d since. Pt report back pain but pt has chronic back pain d/t arthritis.  Hx heart stents and takes Xarelto. Pt denies dizziness upon standing to EMS. EMS gave pt 4mg  IV zofran enroute with improvement in n/v.  Pt is sinus brady but take propanolol.

## 2019-03-20 ENCOUNTER — Inpatient Hospital Stay (HOSPITAL_COMMUNITY): Payer: Medicare Other | Admitting: Anesthesiology

## 2019-03-20 ENCOUNTER — Encounter (HOSPITAL_COMMUNITY): Payer: Self-pay | Admitting: Certified Registered"

## 2019-03-20 ENCOUNTER — Encounter (HOSPITAL_COMMUNITY)
Admission: EM | Disposition: A | Discharge status: Home Health | Payer: Self-pay | Source: Home / Self Care | Attending: Family Medicine

## 2019-03-20 DIAGNOSIS — K819 Cholecystitis, unspecified: Secondary | ICD-10-CM

## 2019-03-20 HISTORY — PX: CHOLECYSTECTOMY: SHX55

## 2019-03-20 LAB — COMPREHENSIVE METABOLIC PANEL
ALT: 17 U/L (ref 0–44)
AST: 28 U/L (ref 15–41)
Albumin: 3.1 g/dL — ABNORMAL LOW (ref 3.5–5.0)
Alkaline Phosphatase: 75 U/L (ref 38–126)
Anion gap: 10 (ref 5–15)
BUN: 9 mg/dL (ref 8–23)
CO2: 26 mmol/L (ref 22–32)
Calcium: 9.1 mg/dL (ref 8.9–10.3)
Chloride: 102 mmol/L (ref 98–111)
Creatinine, Ser: 1.42 mg/dL — ABNORMAL HIGH (ref 0.61–1.24)
GFR calc Af Amer: 54 mL/min — ABNORMAL LOW (ref >60)
GFR calc non Af Amer: 47 mL/min — ABNORMAL LOW (ref >60)
Glucose, Bld: 130 mg/dL — ABNORMAL HIGH (ref 70–99)
Potassium: 3.8 mmol/L (ref 3.5–5.1)
Sodium: 138 mmol/L (ref 135–145)
Total Bilirubin: 0.8 mg/dL (ref 0.3–1.2)
Total Protein: 6.6 g/dL (ref 6.5–8.1)

## 2019-03-20 LAB — CBC
HCT: 38.1 % — ABNORMAL LOW (ref 39.0–52.0)
Hemoglobin: 12.2 g/dL — ABNORMAL LOW (ref 13.0–17.0)
MCH: 32.4 pg (ref 26.0–34.0)
MCHC: 32 g/dL (ref 30.0–36.0)
MCV: 101.1 fL — ABNORMAL HIGH (ref 80.0–100.0)
Platelets: 223 10*3/uL (ref 150–400)
RBC: 3.77 MIL/uL — ABNORMAL LOW (ref 4.22–5.81)
RDW: 15 % (ref 11.5–15.5)
WBC: 6.2 10*3/uL (ref 4.0–10.5)
nRBC: 0 % (ref 0.0–0.2)

## 2019-03-20 LAB — TSH: TSH: 7.632 u[IU]/mL — ABNORMAL HIGH (ref 0.350–4.500)

## 2019-03-20 LAB — SURGICAL PCR SCREEN
MRSA, PCR: NEGATIVE
Staphylococcus aureus: NEGATIVE

## 2019-03-20 SURGERY — LAPAROSCOPIC CHOLECYSTECTOMY
Anesthesia: General | Site: Abdomen

## 2019-03-20 MED ORDER — HYDRALAZINE HCL 20 MG/ML IJ SOLN
5.0000 mg | Freq: Once | INTRAMUSCULAR | Status: AC
  Administered 2019-03-20: 21:00:00 5 mg via INTRAVENOUS
  Filled 2019-03-20: qty 1

## 2019-03-20 MED ORDER — BUPIVACAINE-EPINEPHRINE 0.25% -1:200000 IJ SOLN
INTRAMUSCULAR | Status: DC | PRN
  Administered 2019-03-20: 15 mL

## 2019-03-20 MED ORDER — OXYCODONE HCL 5 MG PO TABS: 5.0000 mg | ORAL_TABLET | Freq: Once | ORAL | Status: DC | PRN

## 2019-03-20 MED ORDER — DEXAMETHASONE SODIUM PHOSPHATE 10 MG/ML IJ SOLN
INTRAMUSCULAR | Status: AC
  Filled 2019-03-20: qty 1

## 2019-03-20 MED ORDER — ROCURONIUM BROMIDE 10 MG/ML (PF) SYRINGE
PREFILLED_SYRINGE | INTRAVENOUS | Status: DC | PRN
  Administered 2019-03-20: 5 mg via INTRAVENOUS
  Administered 2019-03-20: 30 mg via INTRAVENOUS
  Administered 2019-03-20: 5 mg via INTRAVENOUS

## 2019-03-20 MED ORDER — FENTANYL CITRATE (PF) 100 MCG/2ML IJ SOLN
INTRAMUSCULAR | Status: AC
  Filled 2019-03-20: qty 2

## 2019-03-20 MED ORDER — OXYCODONE HCL 5 MG/5ML PO SOLN: 5.0000 mg | Freq: Once | ORAL | Status: DC | PRN

## 2019-03-20 MED ORDER — BUPIVACAINE-EPINEPHRINE (PF) 0.25% -1:200000 IJ SOLN
INTRAMUSCULAR | Status: AC
  Filled 2019-03-20: qty 30

## 2019-03-20 MED ORDER — DEXAMETHASONE SODIUM PHOSPHATE 10 MG/ML IJ SOLN
INTRAMUSCULAR | Status: DC | PRN
  Administered 2019-03-20: 4 mg via INTRAVENOUS

## 2019-03-20 MED ORDER — LABETALOL HCL 5 MG/ML IV SOLN
INTRAVENOUS | Status: DC | PRN
  Administered 2019-03-20: 5 mg via INTRAVENOUS
  Administered 2019-03-20 (×3): 2.5 mg via INTRAVENOUS
  Administered 2019-03-20: 5 mg via INTRAVENOUS
  Administered 2019-03-20: 2.5 mg via INTRAVENOUS

## 2019-03-20 MED ORDER — ONDANSETRON HCL 4 MG/2ML IJ SOLN
INTRAMUSCULAR | Status: AC
  Filled 2019-03-20: qty 2

## 2019-03-20 MED ORDER — SUCCINYLCHOLINE CHLORIDE 200 MG/10ML IV SOSY
PREFILLED_SYRINGE | INTRAVENOUS | Status: DC | PRN
  Administered 2019-03-20: 100 mg via INTRAVENOUS

## 2019-03-20 MED ORDER — 0.9 % SODIUM CHLORIDE (POUR BTL) OPTIME
TOPICAL | Status: DC | PRN
  Administered 2019-03-20: 1000 mL

## 2019-03-20 MED ORDER — ACETAMINOPHEN 10 MG/ML IV SOLN: 1000.0000 mg | Freq: Once | INTRAVENOUS | Status: DC | PRN

## 2019-03-20 MED ORDER — LACTATED RINGERS IV SOLN
INTRAVENOUS | Status: DC
  Administered 2019-03-20 (×2): via INTRAVENOUS

## 2019-03-20 MED ORDER — METOPROLOL TARTRATE 5 MG/5ML IV SOLN
5.0000 mg | Freq: Once | INTRAVENOUS | Status: AC
  Administered 2019-03-20: 5 mg via INTRAVENOUS
  Filled 2019-03-20: qty 5

## 2019-03-20 MED ORDER — DIPHENHYDRAMINE HCL 50 MG/ML IJ SOLN
INTRAMUSCULAR | Status: AC
  Filled 2019-03-20: qty 2

## 2019-03-20 MED ORDER — PROPOFOL 10 MG/ML IV BOLUS
INTRAVENOUS | Status: AC
  Filled 2019-03-20: qty 20

## 2019-03-20 MED ORDER — SUGAMMADEX SODIUM 200 MG/2ML IV SOLN
INTRAVENOUS | Status: DC | PRN
  Administered 2019-03-20: 200 mg via INTRAVENOUS

## 2019-03-20 MED ORDER — PROMETHAZINE HCL 25 MG/ML IJ SOLN: 6.2500 mg | INTRAMUSCULAR | Status: DC | PRN

## 2019-03-20 MED ORDER — PROPOFOL 10 MG/ML IV BOLUS
INTRAVENOUS | Status: DC | PRN
  Administered 2019-03-20: 120 mg via INTRAVENOUS

## 2019-03-20 MED ORDER — LACTATED RINGERS IR SOLN
Status: DC | PRN
  Administered 2019-03-20: 2000 mL

## 2019-03-20 MED ORDER — LIDOCAINE 2% (20 MG/ML) 5 ML SYRINGE
INTRAMUSCULAR | Status: DC | PRN
  Administered 2019-03-20: 60 mg via INTRAVENOUS
  Administered 2019-03-20: 40 mg via INTRAVENOUS

## 2019-03-20 MED ORDER — EPHEDRINE 5 MG/ML INJ
INTRAVENOUS | Status: AC
  Filled 2019-03-20: qty 10

## 2019-03-20 MED ORDER — LIDOCAINE 2% (20 MG/ML) 5 ML SYRINGE
INTRAMUSCULAR | Status: AC
  Filled 2019-03-20: qty 5

## 2019-03-20 MED ORDER — SUGAMMADEX SODIUM 200 MG/2ML IV SOLN
INTRAVENOUS | Status: AC
  Filled 2019-03-20: qty 2

## 2019-03-20 MED ORDER — ROCURONIUM BROMIDE 10 MG/ML (PF) SYRINGE
PREFILLED_SYRINGE | INTRAVENOUS | Status: AC
  Filled 2019-03-20: qty 10

## 2019-03-20 MED ORDER — FENTANYL CITRATE (PF) 250 MCG/5ML IJ SOLN
INTRAMUSCULAR | Status: DC | PRN
  Administered 2019-03-20 (×7): 50 ug via INTRAVENOUS

## 2019-03-20 MED ORDER — EPHEDRINE SULFATE-NACL 50-0.9 MG/10ML-% IV SOSY
PREFILLED_SYRINGE | INTRAVENOUS | Status: DC | PRN
  Administered 2019-03-20 (×2): 5 mg via INTRAVENOUS

## 2019-03-20 MED ORDER — FENTANYL CITRATE (PF) 100 MCG/2ML IJ SOLN: 25.0000 ug | INTRAMUSCULAR | Status: DC | PRN

## 2019-03-20 SURGICAL SUPPLY — 48 items
APPLIER CLIP ROT 10 11.4 M/L (STAPLE) ×3
CABLE HIGH FREQUENCY MONO STRZ (ELECTRODE) ×3 IMPLANT
CHLORAPREP W/TINT 26 (MISCELLANEOUS) ×3 IMPLANT
CLIP APPLIE ROT 10 11.4 M/L (STAPLE) ×1 IMPLANT
COVER MAYO STAND STRL (DRAPES) IMPLANT
COVER SURGICAL LIGHT HANDLE (MISCELLANEOUS) ×3 IMPLANT
COVER WAND RF STERILE (DRAPES) IMPLANT
CUTTER FLEX LINEAR 45M (STAPLE) ×2 IMPLANT
DECANTER SPIKE VIAL GLASS SM (MISCELLANEOUS) ×3 IMPLANT
DERMABOND ADVANCED (GAUZE/BANDAGES/DRESSINGS) ×2
DERMABOND ADVANCED .7 DNX12 (GAUZE/BANDAGES/DRESSINGS) ×1 IMPLANT
DRAIN CHANNEL 19F RND (DRAIN) ×2 IMPLANT
DRAPE C-ARM 42X120 X-RAY (DRAPES) IMPLANT
ELECT REM PT RETURN 15FT ADLT (MISCELLANEOUS) ×3 IMPLANT
EVACUATOR SILICONE 100CC (DRAIN) ×2 IMPLANT
GLOVE BIO SURGEON STRL SZ 6 (GLOVE) ×3 IMPLANT
GLOVE BIO SURGEON STRL SZ8 (GLOVE) ×2 IMPLANT
GLOVE BIOGEL PI IND STRL 7.0 (GLOVE) IMPLANT
GLOVE BIOGEL PI IND STRL 7.5 (GLOVE) IMPLANT
GLOVE BIOGEL PI INDICATOR 7.0 (GLOVE) ×2
GLOVE BIOGEL PI INDICATOR 7.5 (GLOVE) ×4
GLOVE ECLIPSE 6.5 STRL STRAW (GLOVE) ×2 IMPLANT
GLOVE INDICATOR 6.5 STRL GRN (GLOVE) ×3 IMPLANT
GLOVE INDICATOR 8.0 STRL GRN (GLOVE) ×2 IMPLANT
GOWN STRL REUS W/TWL LRG LVL3 (GOWN DISPOSABLE) ×5 IMPLANT
GOWN STRL REUS W/TWL XL LVL3 (GOWN DISPOSABLE) ×6 IMPLANT
GRASPER SUT TROCAR 14GX15 (MISCELLANEOUS) ×3 IMPLANT
HEMOSTAT SNOW SURGICEL 2X4 (HEMOSTASIS) ×2 IMPLANT
KIT BASIN OR (CUSTOM PROCEDURE TRAY) ×3 IMPLANT
KIT TURNOVER KIT A (KITS) IMPLANT
NDL INSUFFLATION 14GA 120MM (NEEDLE) ×1 IMPLANT
NEEDLE INSUFFLATION 14GA 120MM (NEEDLE) ×3 IMPLANT
POUCH SPECIMEN RETRIEVAL 10MM (ENDOMECHANICALS) ×3 IMPLANT
RELOAD STAPLE 45 3.5 BLU ETS (ENDOMECHANICALS) IMPLANT
RELOAD STAPLE TA45 3.5 REG BLU (ENDOMECHANICALS) ×3 IMPLANT
SCISSORS LAP 5X35 DISP (ENDOMECHANICALS) ×3 IMPLANT
SET CHOLANGIOGRAPH MIX (MISCELLANEOUS) IMPLANT
SET IRRIG TUBING LAPAROSCOPIC (IRRIGATION / IRRIGATOR) ×3 IMPLANT
SET TUBE SMOKE EVAC HIGH FLOW (TUBING) ×3 IMPLANT
SLEEVE XCEL OPT CAN 5 100 (ENDOMECHANICALS) ×8 IMPLANT
SPONGE DRAIN TRACH 4X4 STRL 2S (GAUZE/BANDAGES/DRESSINGS) ×2 IMPLANT
SUT ETHILON 2 0 PS N (SUTURE) ×2 IMPLANT
SUT MNCRL AB 4-0 PS2 18 (SUTURE) ×3 IMPLANT
TOWEL OR 17X26 10 PK STRL BLUE (TOWEL DISPOSABLE) ×3 IMPLANT
TOWEL OR NON WOVEN STRL DISP B (DISPOSABLE) IMPLANT
TRAY LAPAROSCOPIC (CUSTOM PROCEDURE TRAY) ×3 IMPLANT
TROCAR BLADELESS OPT 5 100 (ENDOMECHANICALS) ×3 IMPLANT
TROCAR XCEL 12X100 BLDLESS (ENDOMECHANICALS) ×3 IMPLANT

## 2019-03-20 NOTE — Anesthesia Postprocedure Evaluation (Signed)
Anesthesia Post Note  Patient: Gary [name]  Procedure(s) Performed: LAPAROSCOPIC CHOLECYSTECTOMY (N/A Abdomen)     Patient location during evaluation: PACU Anesthesia Type: General Level of consciousness: awake and alert Pain management: pain level controlled Vital Signs Assessment: post-procedure vital signs reviewed and stable Respiratory status: spontaneous breathing, nonlabored ventilation and respiratory function stable Cardiovascular status: blood pressure returned to baseline and stable Postop Assessment: no apparent nausea or vomiting Anesthetic complications: no    Last Vitals:  Vitals:   03/20/19 1615 03/20/19 1630  BP: (!) 170/88 (!) 176/90  Pulse: (!) 57 64  Resp: (!) 9 12  Temp:  36.7 C  SpO2: 100% 100%    Last Pain:  Vitals:   03/20/19 1615  TempSrc:   PainSc: 0-No pain                 Brennan Bailey

## 2019-03-20 NOTE — Consult Note (Signed)
CARDIOLOGY CONSULT NOTE       Patient ID: Gary [name] MRN: [id number] DOB/AGE: 1940/12/19 78 y.o.  Admit date: 03/19/2019 Referring Physician:  Darrick Meigs Primary Physician: Deland Pretty, MD Primary Cardiologist: Pierron here  Reason for Consultation: Preoperative Assessment  Principal Problem:   Acute cholecystitis Active Problems:   HTN (hypertension)   RA (rheumatoid arthritis) ([place])   CAD, LAD stent 2002   PAF, new Feb 2014 with SSS component   Chronic anticoagulation, new Feb 2014- Xarelto   Pulmonary nodule   CAP (community acquired pneumonia)   HPI:  78 y.o. history of CAD Last stent done 09/13/16 stents to LAD and POB to OM2 History of PAF. Normal EF. Has not had stress testing since intervention. Lives alone Activity limited by arthritis. No angina. Can do 5 METS. One week of biliary cholic US/ HIDA and CT all consistent with acute cholecystitis. Baseline ECG is normal Plan for low risk laparoscopic cholecystectomy this am   ROS All other systems reviewed and negative except as noted above  Past Medical History:  Diagnosis Date  . Anemia    "chronic anemic"  . Anginal pain ([place])   . Anxiety   . 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 Cancer ([place])    pre-cancerous lesions removed 11/22/12 and hx of  . 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 Chronic back pain    hx of  . Chronic kidney disease    renal insufficiency, [place] kidney  . Coronary artery disease    stent placement 2000, does not see cardiologist at this time  . Dyslipidemia   . Full dentures    Edentulous since age [age]  . GERD (gastroesophageal reflux disease)   . Gout    pseudo gout  . Hearing impairment    job related, hbilat hearing aids  . Hepatitis    hepatitis "C"  . History of radiation therapy 03/11/2013   thru -04/23/2013;  right retromolar trigone/54Gy/29fx  . Hypertension    sees Dr. Deland Pretty  .  Hypertension   . Hypothyroid   . Insomnia    cpap  . Rheumatoid arthritis ([place])    "all over" (09/13/2016)  . Sleep apnea    "tried mask 3 different times; couldn't tolerate it" (09/13/2016)  . Thyroid disease    Hypothyroidism    Family History  Problem Relation Age of Onset  . Coronary artery disease Brother 32  . Heart attack Brother   . Coronary artery disease Father        stents in 70's  . Cancer Mother        bone    Social History   Socioeconomic History  . Marital status: Widowed    Spouse name: Not on file  . Number of children: 2  . Years of education: Not on file  . Highest education level: Not on file  Occupational History  . Occupation: Retired    Fish farm manager: RETIRED    Comment: Engineer, building services  Social Needs  . Financial resource strain: Not on file  . Food insecurity:    Worry: Not on file    Inability: Not on file  . Transportation needs:    Medical: Not on file    Non-medical: Not on file  Tobacco Use  . Smoking status: Former Smoker    Packs/day: 1.00    Years: 27.00    Pack years: 27.00  Types: Cigarettes    Last attempt to quit: 11/08/1983    Years since quitting: 35.3  . Smokeless tobacco: Never Used  Substance and Sexual Activity  . Alcohol use: Yes    Comment: 09/13/2016 "nothing since 12/2012"  . Drug use: No  . Sexual activity: Not on file  Lifestyle  . Physical activity:    Days per week: Not on file    Minutes per session: Not on file  . Stress: Not on file  Relationships  . Social connections:    Talks on phone: Not on file    Gets together: Not on file    Attends religious service: Not on file    Active member of club or organization: Not on file    Attends meetings of clubs or organizations: Not on file    Relationship status: Not on file  . Intimate partner violence:    Fear of current or ex partner: Not on file    Emotionally abused: Not on file    Physically abused: Not on file    Forced sexual activity: Not on file   Other Topics Concern  . Not on file  Social History Narrative  . Not on file    Past Surgical History:  Procedure Laterality Date  . CARDIAC CATHETERIZATION N/A 09/13/2016   Procedure: Left Heart Cath and Coronary Angiography;  Surgeon: Adrian Prows, MD;  Location: [facility];  Service: Cardiovascular;  Laterality: N/A;  . CARDIAC CATHETERIZATION N/A 10/11/2016   Procedure: Coronary Stent Intervention;  Surgeon: Adrian Prows, MD;  Location: [facility];  Service: Cardiovascular;  Laterality: N/A;  LAD & 1st Diag  . CATARACT EXTRACTION W/ INTRAOCULAR LENS  IMPLANT, BILATERAL Bilateral 2007  . CORONARY ANGIOPLASTY  09/13/2016  . CORONARY ANGIOPLASTY WITH STENT PLACEMENT  2002  . FRACTURE SURGERY    . HIP PINNING,CANNULATED Left 12/22/2012   Procedure: CANNULATED HIP PINNING;  Surgeon: Mcarthur Rossetti, MD;  Location: [facility];  Service: Orthopedics;  Laterality: Left;  [name] MULTIPLE TOOTH EXTRACTIONS  ~ 1962   Edentulous  . RADICAL NECK DISSECTION Right 01/22/2013   [facility]  . SKIN CANCER EXCISION     "face, ears"  . SKIN GRAFT Left ~ 1961   "grafted skin off my stomach & put it on left hand that I had caught in fan belt"  . TONSILLECTOMY       . atorvastatin  40 mg Oral q1800  . doxycycline  100 mg Oral Q12H  . levothyroxine  112 mcg Oral QAC breakfast  . mirtazapine  7.5 mg Oral QHS  . multivitamin with minerals  1 tablet Oral Daily  . omega-3 acid ethyl esters  1 g Oral Daily  . predniSONE  5 mg Oral QHS  . sertraline  100 mg Oral QHS   . sodium chloride 125 mL/hr at 03/19/19 0342  . piperacillin-tazobactam (ZOSYN)  IV 3.375 g (03/20/19 0631)    Physical Exam: Blood pressure (!) 146/94, pulse 96, temperature 98.2 F (36.8 C), temperature source Oral, resp. rate 18, height 6\' 3"  (1.905 m), weight 77.1 kg, SpO2 96 %.    Affect appropriate Healthy:  appears stated age 21: normal Neck supple with no adenopathy JVP normal no bruits no thyromegaly Lungs  clear with no wheezing and good diaphragmatic motion Heart:  S1/S2 no murmur, no rub, gallop or click PMI normal Abdomen: RUQ pain no rebound  no bruit.  No HSM or HJR Distal pulses intact with no bruits  No edema Neuro non-focal Skin warm and dry No muscular weakness   Labs:   Lab Results  Component Value Date   WBC 6.2 03/20/2019   HGB 12.2 (L) 03/20/2019   HCT 38.1 (L) 03/20/2019   MCV 101.1 (H) 03/20/2019   PLT 223 03/20/2019    Recent Labs  Lab 03/20/19 0519  NA 138  K 3.8  CL 102  CO2 26  BUN 9  CREATININE 1.42*  CALCIUM 9.1  PROT 6.6  BILITOT 0.8  ALKPHOS 75  ALT 17  AST 28  GLUCOSE 130*   Lab Results  Component Value Date   TROPONINI <0.30 12/22/2012    Lab Results  Component Value Date   CHOL  11/28/2010    130        ATP III CLASSIFICATION:  <200     mg/dL   Desirable  200-239  mg/dL   Borderline High  >=240    mg/dL   High          Lab Results  Component Value Date   HDL 28 (L) 11/28/2010   Lab Results  Component Value Date   [facility]  11/28/2010    89        Total Cholesterol/HDL:CHD Risk Coronary Heart Disease Risk Table                     Men   Women  1/2 Average Risk   3.4   3.3  Average Risk       5.0   4.4  2 X Average Risk   9.6   7.1  3 X Average Risk  23.4   11.0        Use the calculated Patient Ratio above and the CHD Risk Table to determine the patient's CHD Risk.        ATP III CLASSIFICATION (LDL):  <100     mg/dL   Optimal  100-129  mg/dL   Near or Above                    Optimal  130-159  mg/dL   Borderline  160-189  mg/dL   High  >190     mg/dL   Very High   Lab Results  Component Value Date   TRIG 65 11/28/2010   Lab Results  Component Value Date   CHOLHDL 4.6 11/28/2010   No results found for: LDLDIRECT    Radiology: Nm Hepato W/eject Fract  Result Date: 03/19/2019 CLINICAL DATA:  RIGHT upper quadrant pain. Cholelithiasis on ultrasound. Pericholecystic fluid on CT examination. EXAM: NUCLEAR  MEDICINE HEPATOBILIARY IMAGING TECHNIQUE: Sequential images of the abdomen were obtained out to 60 minutes following intravenous administration of radiopharmaceutical. 3 mg IV morphine RADIOPHARMACEUTICALS:  5.5 mCi Tc-64m Choletec IV (booster dose of 1.1 millicurie Choletec administered after 90 minutes) COMPARISON:  CT 03/19/2019, ultrasound 03/19/2019 FINDINGS: There is prompt clearance radiotracer from the blood pool and homogeneous uptake in the liver. Counts are evident in the common bile duct and small bowel by 30 minutes. The gallbladder is not visualized at 90 minutes. At 90 minutes, IV morphine was administered to contract the sphincter of Oddi. 30 minutes of imaging following IV administration of morphine fail to demonstrate filling of the gallbladder. IMPRESSION: Non filling of the gallbladder indicates obstruction of the cystic duct. Findings consistent with ACUTE CHOLECYSTITIS. These results will be called to the ordering clinician or representative by the Radiologist Assistant, and communication documented in the  PACS or zVision Dashboard. Electronically Signed   By: Suzy Bouchard M.D.   On: 03/19/2019 15:11   Dg Chest Port 1 View  Result Date: 03/19/2019 CLINICAL DATA:  Nausea and vomiting with chest pain EXAM: PORTABLE CHEST 1 VIEW COMPARISON:  Chest CT 04/21/2015 FINDINGS: The heart size and mediastinal contours are within normal limits. Both lungs are clear. The visualized skeletal structures are unremarkable. IMPRESSION: No active disease. Electronically Signed   By: Ulyses Jarred M.D.   On: 03/19/2019 01:17   Ct Angio Chest/abd/pel For Dissection W And/or Wo Contrast  Result Date: 03/19/2019 CLINICAL DATA:  78 year old male with abdominal pain and vomiting. Concern for aortic dissection. History of squamous cell carcinoma of the head and neck. EXAM: CT ANGIOGRAPHY CHEST, ABDOMEN AND PELVIS TECHNIQUE: Multidetector CT imaging through the chest, abdomen and pelvis was performed using  the standard protocol during bolus administration of intravenous contrast. Multiplanar reconstructed images and MIPs were obtained and reviewed to evaluate the vascular anatomy. CONTRAST:  21mL OMNIPAQUE IOHEXOL 350 MG/ML SOLN COMPARISON:  Chest radiograph dated 03/19/2019 and CT dated 03/21/2015 FINDINGS: CTA CHEST FINDINGS Cardiovascular: There is no cardiomegaly or pericardial effusion. Advanced coronary vascular calcification of the LAD. Minimal atherosclerotic calcification of the thoracic aorta. No aneurysmal dilatation or dissection. The origins of the great vessels of the aortic arch appear patent. No pulmonary artery embolus identified. Mediastinum/Nodes: Right hilar calcified lymph nodes. No mediastinal adenopathy. There is an area of dilatation of the upper esophagus which may represent a diverticulum. No mediastinal fluid collection. Lungs/Pleura: There is a cluster of nodular density in the right upper lobe concerning for an infectious process such as atypical infection. There is an 8 mm nodular density in the right lower lobe (series 9 image 99) which may represent inflammatory or infectious in etiology although metastatic disease is not entirely excluded. Clinical correlation and follow-up recommended. There are bibasilar dependent atelectatic changes and scarring. No lobar consolidation, pleural effusion, or pneumothorax. The central airways are patent. Musculoskeletal: There is osteopenia with degenerative changes of the spine. No acute osseous pathology. Old right posterior 12 rib fracture. Review of the MIP images confirms the above findings. CTA ABDOMEN AND PELVIS FINDINGS VASCULAR Aorta: Mild atherosclerotic disease. No aneurysmal dilatation or dissection. The aorta is mildly tortuous. Celiac: Patent without evidence of aneurysm, dissection, vasculitis or significant stenosis. SMA: Atherosclerotic calcification. The SMA is patent. Renals: There is atherosclerotic calcification of the renal  ostia. The renal arteries remain patent. IMA: Patent without evidence of aneurysm, dissection, vasculitis or significant stenosis. 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 The iliac arteries are otherwise patent. Veins: No obvious venous abnormality within the limitations of this arterial phase study. No portal venous gas. Review of the MIP images confirms the above findings. NON-VASCULAR No intra-abdominal free air or free fluid. Hepatobiliary: Apparent fatty infiltration of the liver. No intrahepatic biliary ductal dilatation. There is a calcified stone within the gallbladder. The gallbladder is distended. A stone is noted at the gallbladder neck. There is inflammatory changes and haziness of the gallbladder wall. Findings most consistent with an acute cholecystitis. Further evaluation with ultrasound recommended. Pancreas: Unremarkable. No pancreatic ductal dilatation or surrounding inflammatory changes. Spleen: Normal in size without focal abnormality. Adrenals/Urinary Tract: The adrenal glands are unremarkable. Mild bilateral renal parenchyma atrophy with areas of cortical scarring in the inferior pole of the right kidney. There is no hydronephrosis on either side. The visualized ureters and  urinary bladder appear unremarkable. Stomach/Bowel: There is sigmoid diverticulosis without active inflammatory changes. No bowel obstruction or active inflammation. Normal appendix. Lymphatic: No adenopathy. Reproductive: Dystrophic calcification of the prostate gland. The seminal vesicles are symmetric. Other: None Musculoskeletal: Osteopenia. No acute osseous pathology. Left femoral neck fixation screws. Review of the MIP images confirms the above findings. IMPRESSION: 1. No aortic aneurysm or dissection. No pulmonary artery embolus identified. 2. Cholelithiasis with findings of acute cholecystitis. Further evaluation with ultrasound recommended.  3. Cluster of nodular densities in the right upper lobe concerning for an infectious process such as atypical pneumonia. Clinical correlation and follow-up recommended. 4. An 8 mm right lower lobe nodular density is also likely infectious in etiology. Non-contrast chest CT at 6-12 months is recommended. If the nodule is stable at time of repeat CT, then future CT at 18-24 months (from today's scan) is considered optional for low-risk patients, but is recommended for high-risk patients. This recommendation follows the consensus statement: Guidelines for Management of Incidental Pulmonary Nodules Detected on CT Images: From the Fleischner Society 2017; Radiology 2017; 284:228-243. 5. 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 6. Sigmoid diverticulosis. No bowel obstruction or active inflammation. Normal appendix. Electronically Signed   By: Anner Crete M.D.   On: 03/19/2019 03:54   US Abdomen Limited Ruq  Result Date: 03/19/2019 CLINICAL DATA:  78 year old male with right upper quadrant pain and suspicion of cholelithiasis and cholecystitis on CTA Chest, Abdomen, and Pelvis earlier today EXAM: ULTRASOUND ABDOMEN LIMITED RIGHT UPPER QUADRANT COMPARISON:  CTA Chest, Abdomen, and Pelvis earlier today. FINDINGS: Gallbladder: On some images the gallbladder wall appears normal, but on image 6 there is thickening at the fundus of 5-6 millimeters. Echogenic partially shadowing cholelithiasis identified (image 11). There is also dependent sludge within the gallbladder. Gallstones size estimated at 8 millimeters. No sonographic Murphy sign elicited. Common bile duct: Diameter: 5 millimeters, normal. Liver: Echogenic liver (image 22). No discrete liver lesion. No intrahepatic biliary ductal dilatation. Portal vein is patent on color Doppler imaging with normal direction of blood flow towards the liver. Other findings: Negative visible right kidney. IMPRESSION: 1. Focal gallbladder  wall thickening with sludge and gallstones. Together with the earlier CTA appearance this is suspicious for Acute Cholecystitis. 2. CBD remains within normal limits.  Fatty liver disease. Electronically Signed   By: Genevie Ann M.D.   On: 03/19/2019 05:12    EKG:  NSR normal ECG    ASSESSMENT AND PLAN:   CAD/Preoperative:  Moderate risk given history of CAD, fairly sedentary But no symptoms normal EF, normal ECG and low risk surgery. Clear to proceed this am as risk of sepsis higher than risk of perioperative event. Continue inderal , ASA and statin  PAF:  xarelto held continue inderal bid check   Thyroid on replacement no recent TSH in chart check prior to surgery   Signed: Jenkins Rouge 03/20/2019, 7:42 AM

## 2019-03-20 NOTE — Progress Notes (Signed)
[facility]  PROGRESS NOTE  Gary [name] ZYS:[id number] DOB: [birth year] DOA: 03/19/2019 PCP: Deland Pretty, MD   Brief HPI:   78 year old male with history of CAD, status post stent x4, proximal atrial fibrillation on Xarelto, remote arthritis on prednisone, hypertension, HCV, head and neck CA in remission, CKD stage III came to ED with complaints of nausea vomiting and diarrhea.  With diffuse abdominal pain.HIDA scan came back positive for acute cholecystitis    Subjective   Patient seen and examined, awaiting to go  For surgery   Assessment/Plan:     1. Acute cholecystitis-HIDA scan positive for acute cholecystitis.  Plan for cholecystectomy today.  Patient started on Zosyn and doxycycline.  General surgery following.  2. Community-acquired pneumonia-nodular density seen on the CT chest.  Started on Zosyn and doxycycline.  COVID-19 test was negative.  3. Paroxysmal atrial fibrillation-continue metoprolol.  Xarelto is on hold.  4. CAD status post stents x 4-cardiology was consulted patient deemed moderate risk given history of CAD.  Xarelto was held.  Continued on aspirin and statin.  5. Rheumatoid arthritis-continue prednisone at home dose.  6. Pulmonary nodule-likely infectious, will need repeat CT in 6 months.  Patient has history of head and neck cancer.       CBG: No results for input(s): GLUCAP in the last 168 hours.  CBC: Recent Labs  Lab 03/19/19 0043 03/20/19 0519  WBC 6.6 6.2  NEUTROABS 4.8  --   HGB 12.1* 12.2*  HCT 38.2* 38.1*  MCV 100.5* 101.1*  PLT 226 932    Basic Metabolic Panel: Recent Labs  Lab 03/19/19 0043 03/20/19 0519  NA 138 138  K 3.7 3.8  CL 102 102  CO2 27 26  GLUCOSE 158* 130*  BUN 14 9  CREATININE 1.49* 1.42*  CALCIUM 9.1 9.1     DVT prophylaxis: SCDs  Code Status: Full code  Family Communication: No family at bedside  Disposition Plan: likely home when medically ready for  discharge     Consultants:  General surgery  Procedures:     Antibiotics:   Anti-infectives (From admission, onward)   Start     Dose/Rate Route Frequency Ordered Stop   03/19/19 2200  doxycycline (VIBRA-TABS) tablet 100 mg     100 mg Oral Every 12 hours 03/19/19 0554     03/19/19 1100  piperacillin-tazobactam (ZOSYN) IVPB 3.375 g     3.375 g 12.5 mL/hr over 240 Minutes Intravenous Every 8 hours 03/19/19 0603     03/19/19 0415  piperacillin-tazobactam (ZOSYN) IVPB 3.375 g     3.375 g 100 mL/hr over 30 Minutes Intravenous  Once 03/19/19 0403 03/19/19 0514   03/19/19 0415  doxycycline (VIBRAMYCIN) 100 mg in sodium chloride 0.9 % 250 mL IVPB     100 mg 125 mL/hr over 120 Minutes Intravenous  Once 03/19/19 0404 03/19/19 0728       Objective   Vitals:   03/20/19 1658 03/20/19 1800 03/20/19 1842 03/20/19 1940  BP: (!) 172/91 (!) 174/88 (!) 176/89 (!) 177/96  Pulse: (!) 58 66 69 66  Resp: 17 18 18 18   Temp: 97.9 F (36.6 C) 98 F (36.7 C) 98 F (36.7 C) 97.7 F (36.5 C)  TempSrc: Oral Oral Oral Oral  SpO2:  100% 100% 100%  Weight:      Height:        Intake/Output Summary (Last 24 hours) at 03/20/2019 1957 Last data filed at 03/20/2019 1615 Gross per 24 hour  Intake 2930.42 ml  Output 1425 ml  Net 1505.42 ml   Filed Weights   03/19/19 0024  Weight: 77.1 kg     Physical Examination:    General: Appears in no acute distress  Cardiovascular: S1-S2, regular, no murmur auscultated  Respiratory: Clear to auscultation bilaterally, no wheezing or crackles  Abdomen: Abdomen is soft, nontender, no organomegaly  Extremities: No edema in the lower extremities      Data Reviewed: I have personally reviewed following labs and imaging studies   Recent Results (from the past 240 hour(s))  SARS Coronavirus 2 (CEPHEID - Performed in [place] hospital lab), Hosp Order     Status: None   Collection Time: 03/19/19 12:44 AM  Result Value Ref Range Status    SARS Coronavirus 2 NEGATIVE NEGATIVE Final    Comment: (NOTE) If result is NEGATIVE SARS-CoV-2 target nucleic acids are NOT DETECTED. The SARS-CoV-2 RNA is generally detectable in upper and lower  respiratory specimens during the acute phase of infection. The lowest  concentration of SARS-CoV-2 viral copies this assay can detect is 250  copies / mL. A negative result does not preclude SARS-CoV-2 infection  and should not be used as the sole basis for treatment or other  patient management decisions.  A negative result may occur with  improper specimen collection / handling, submission of specimen other  than nasopharyngeal swab, presence of viral mutation(s) within the  areas targeted by this assay, and inadequate number of viral copies  (<250 copies / mL). A negative result must be combined with clinical  observations, patient history, and epidemiological information. If result is POSITIVE SARS-CoV-2 target nucleic acids are DETECTED. The SARS-CoV-2 RNA is generally detectable in upper and lower  respiratory specimens dur ing the acute phase of infection.  Positive  results are indicative of active infection with SARS-CoV-2.  Clinical  correlation with patient history and other diagnostic information is  necessary to determine patient infection status.  Positive results do  not rule out bacterial infection or co-infection with other viruses. If result is PRESUMPTIVE POSTIVE SARS-CoV-2 nucleic acids MAY BE PRESENT.   A presumptive positive result was obtained on the submitted specimen  and confirmed on repeat testing.  While 2019 novel coronavirus  (SARS-CoV-2) nucleic acids may be present in the submitted sample  additional confirmatory testing may be necessary for epidemiological  and / or clinical management purposes  to differentiate between  SARS-CoV-2 and other Sarbecovirus currently known to infect humans.  If clinically indicated additional testing with an alternate test   methodology [phone]) is advised. The SARS-CoV-2 RNA is generally  detectable in upper and lower respiratory sp ecimens during the acute  phase of infection. The expected result is Negative. Fact Sheet for Patients:  StrictlyIdeas.no Fact Sheet for Healthcare Providers: BankingDealers.co.za This test is not yet approved or cleared by the [place] FDA and has been authorized for detection and/or diagnosis of SARS-CoV-2 by FDA under an Emergency Use Authorization (EUA).  This EUA will remain in effect (meaning this test can be used) for the duration of the COVID-19 declaration under Section 564(b)(1) of the Act, 21 U.S.C. section 360bbb-3(b)(1), unless the authorization is terminated or revoked sooner. Performed at [facility], [place] [address] [place], [place] [address]   Blood culture (routine x 2)     Status: None (Preliminary result)   Collection Time: 03/19/19  4:29 AM  Result Value Ref Range Status   Specimen Description   Final    BLOOD RIGHT ARM  Performed at [facility], [place] [address], [place], [place] [address]    Special Requests   Final    BOTTLES DRAWN AEROBIC AND ANAEROBIC Blood Culture adequate volume Performed at [facility] [address], [place], [place] [address]    Culture   Final    NO GROWTH 1 DAY Performed at [facility], [place] [address] [place], [place] [address]    Report Status PENDING  Incomplete  Blood culture (routine x 2)     Status: None (Preliminary result)   Collection Time: 03/19/19  4:37 AM  Result Value Ref Range Status   Specimen Description   Final    BLOOD RIGHT ANTECUBITAL Performed at [facility] [address] [place], [place] [address]    Special Requests   Final    BOTTLES DRAWN AEROBIC AND ANAEROBIC Blood Culture results may not be optimal due to an excessive volume of blood received in  culture bottles Performed at [facility] [address] [place], [place] [address]    Culture   Final    NO GROWTH 1 DAY Performed at [facility], [place] [address], [place], [place] [address]    Report Status PENDING  Incomplete  Surgical pcr screen     Status: None   Collection Time: 03/20/19  4:25 AM  Result Value Ref Range Status   MRSA, PCR NEGATIVE NEGATIVE Final   Staphylococcus aureus NEGATIVE NEGATIVE Final    Comment: (NOTE) The Xpert SA Assay (FDA approved for NASAL specimens in patients 41 years of age and older), is one component of a comprehensive surveillance program. It is not intended to diagnose infection nor to guide or monitor treatment. Performed at [facility], [place] [address], [place], [place] [address]      Liver Function Tests: Recent Labs  Lab 03/19/19 0043 03/20/19 0519  AST 39 28  ALT 20 17  ALKPHOS 89 75  BILITOT 0.5 0.8  PROT 6.8 6.6  ALBUMIN 3.3* 3.1*   Recent Labs  Lab 03/19/19 0043  LIPASE 28   No results for input(s): AMMONIA in the last 168 hours.  Cardiac Enzymes: No results for input(s): CKTOTAL, CKMB, CKMBINDEX, TROPONINI in the last 168 hours. BNP (last 3 results) No results for input(s): BNP in the last 8760 hours.  ProBNP (last 3 results) No results for input(s): PROBNP in the last 8760 hours.    Studies: Nm Hepato W/eject Fract  Result Date: 03/19/2019 CLINICAL DATA:  RIGHT upper quadrant pain. Cholelithiasis on ultrasound. Pericholecystic fluid on CT examination. EXAM: NUCLEAR MEDICINE HEPATOBILIARY IMAGING TECHNIQUE: Sequential images of the abdomen were obtained out to 60 minutes following intravenous administration of radiopharmaceutical. 3 mg IV morphine RADIOPHARMACEUTICALS:  5.5 mCi Tc-68m Choletec IV (booster dose of 1.1 millicurie Choletec administered after 90 minutes) COMPARISON:  CT 03/19/2019, ultrasound 03/19/2019 FINDINGS: There is prompt clearance radiotracer  from the blood pool and homogeneous uptake in the liver. Counts are evident in the common bile duct and small bowel by 30 minutes. The gallbladder is not visualized at 90 minutes. At 90 minutes, IV morphine was administered to contract the sphincter of Oddi. 30 minutes of imaging following IV administration of morphine fail to demonstrate filling of the gallbladder. IMPRESSION: Non filling of the gallbladder indicates obstruction of the cystic duct. Findings consistent with ACUTE CHOLECYSTITIS. These results will be called to the ordering clinician or representative by the Radiologist Assistant, and communication documented in  the PACS or zVision Dashboard. Electronically Signed   By: Suzy Bouchard M.D.   On: 03/19/2019 15:11   Dg Chest Port 1 View  Result Date: 03/19/2019 CLINICAL DATA:  Nausea and vomiting with chest pain EXAM: PORTABLE CHEST 1 VIEW COMPARISON:  Chest CT 04/21/2015 FINDINGS: The heart size and mediastinal contours are within normal limits. Both lungs are clear. The visualized skeletal structures are unremarkable. IMPRESSION: No active disease. Electronically Signed   By: Ulyses Jarred M.D.   On: 03/19/2019 01:17   Ct Angio Chest/abd/pel For Dissection W And/or Wo Contrast  Result Date: 03/19/2019 CLINICAL DATA:  78 year old male with abdominal pain and vomiting. Concern for aortic dissection. History of squamous cell carcinoma of the head and neck. EXAM: CT ANGIOGRAPHY CHEST, ABDOMEN AND PELVIS TECHNIQUE: Multidetector CT imaging through the chest, abdomen and pelvis was performed using the standard protocol during bolus administration of intravenous contrast. Multiplanar reconstructed images and MIPs were obtained and reviewed to evaluate the vascular anatomy. CONTRAST:  65mL OMNIPAQUE IOHEXOL 350 MG/ML SOLN COMPARISON:  Chest radiograph dated 03/19/2019 and CT dated 03/21/2015 FINDINGS: CTA CHEST FINDINGS Cardiovascular: There is no cardiomegaly or pericardial effusion. Advanced  coronary vascular calcification of the LAD. Minimal atherosclerotic calcification of the thoracic aorta. No aneurysmal dilatation or dissection. The origins of the great vessels of the aortic arch appear patent. No pulmonary artery embolus identified. Mediastinum/Nodes: Right hilar calcified lymph nodes. No mediastinal adenopathy. There is an area of dilatation of the upper esophagus which may represent a diverticulum. No mediastinal fluid collection. Lungs/Pleura: There is a cluster of nodular density in the right upper lobe concerning for an infectious process such as atypical infection. There is an 8 mm nodular density in the right lower lobe (series 9 image 99) which may represent inflammatory or infectious in etiology although metastatic disease is not entirely excluded. Clinical correlation and follow-up recommended. There are bibasilar dependent atelectatic changes and scarring. No lobar consolidation, pleural effusion, or pneumothorax. The central airways are patent. Musculoskeletal: There is osteopenia with degenerative changes of the spine. No acute osseous pathology. Old right posterior 12 rib fracture. Review of the MIP images confirms the above findings. CTA ABDOMEN AND PELVIS FINDINGS VASCULAR Aorta: Mild atherosclerotic disease. No aneurysmal dilatation or dissection. The aorta is mildly tortuous. Celiac: Patent without evidence of aneurysm, dissection, vasculitis or significant stenosis. SMA: Atherosclerotic calcification. The SMA is patent. Renals: There is atherosclerotic calcification of the renal ostia. The renal arteries remain patent. IMA: Patent without evidence of aneurysm, dissection, vasculitis or significant stenosis. 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 The iliac arteries are otherwise patent. Veins: No obvious venous abnormality within the limitations of this arterial phase study. No portal venous gas. Review  of the MIP images confirms the above findings. NON-VASCULAR No intra-abdominal free air or free fluid. Hepatobiliary: Apparent fatty infiltration of the liver. No intrahepatic biliary ductal dilatation. There is a calcified stone within the gallbladder. The gallbladder is distended. A stone is noted at the gallbladder neck. There is inflammatory changes and haziness of the gallbladder wall. Findings most consistent with an acute cholecystitis. Further evaluation with ultrasound recommended. Pancreas: Unremarkable. No pancreatic ductal dilatation or surrounding inflammatory changes. Spleen: Normal in size without focal abnormality. Adrenals/Urinary Tract: The adrenal glands are unremarkable. Mild bilateral renal parenchyma atrophy with areas of cortical scarring in the inferior pole of the right kidney. There is no hydronephrosis on either side. The visualized ureters  and urinary bladder appear unremarkable. Stomach/Bowel: There is sigmoid diverticulosis without active inflammatory changes. No bowel obstruction or active inflammation. Normal appendix. Lymphatic: No adenopathy. Reproductive: Dystrophic calcification of the prostate gland. The seminal vesicles are symmetric. Other: None Musculoskeletal: Osteopenia. No acute osseous pathology. Left femoral neck fixation screws. Review of the MIP images confirms the above findings. IMPRESSION: 1. No aortic aneurysm or dissection. No pulmonary artery embolus identified. 2. Cholelithiasis with findings of acute cholecystitis. Further evaluation with ultrasound recommended. 3. Cluster of nodular densities in the right upper lobe concerning for an infectious process such as atypical pneumonia. Clinical correlation and follow-up recommended. 4. An 8 mm right lower lobe nodular density is also likely infectious in etiology. Non-contrast chest CT at 6-12 months is recommended. If the nodule is stable at time of repeat CT, then future CT at 18-24 months (from today's scan) is  considered optional for low-risk patients, but is recommended for high-risk patients. This recommendation follows the consensus statement: Guidelines for Management of Incidental Pulmonary Nodules Detected on CT Images: From the Fleischner Society 2017; Radiology 2017; 284:228-243. 5. 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 6. Sigmoid diverticulosis. No bowel obstruction or active inflammation. Normal appendix. Electronically Signed   By: Anner Crete M.D.   On: 03/19/2019 03:54   US Abdomen Limited Ruq  Result Date: 03/19/2019 CLINICAL DATA:  78 year old male with right upper quadrant pain and suspicion of cholelithiasis and cholecystitis on CTA Chest, Abdomen, and Pelvis earlier today EXAM: ULTRASOUND ABDOMEN LIMITED RIGHT UPPER QUADRANT COMPARISON:  CTA Chest, Abdomen, and Pelvis earlier today. FINDINGS: Gallbladder: On some images the gallbladder wall appears normal, but on image 6 there is thickening at the fundus of 5-6 millimeters. Echogenic partially shadowing cholelithiasis identified (image 11). There is also dependent sludge within the gallbladder. Gallstones size estimated at 8 millimeters. No sonographic Murphy sign elicited. Common bile duct: Diameter: 5 millimeters, normal. Liver: Echogenic liver (image 22). No discrete liver lesion. No intrahepatic biliary ductal dilatation. Portal vein is patent on color Doppler imaging with normal direction of blood flow towards the liver. Other findings: Negative visible right kidney. IMPRESSION: 1. Focal gallbladder wall thickening with sludge and gallstones. Together with the earlier CTA appearance this is suspicious for Acute Cholecystitis. 2. CBD remains within normal limits.  Fatty liver disease. Electronically Signed   By: Genevie Ann M.D.   On: 03/19/2019 05:12    Scheduled Meds: . atorvastatin  40 mg Oral q1800  . doxycycline  100 mg Oral Q12H  . levothyroxine  112 mcg Oral QAC breakfast  . mirtazapine   7.5 mg Oral QHS  . multivitamin with minerals  1 tablet Oral Daily  . omega-3 acid ethyl esters  1 g Oral Daily  . predniSONE  5 mg Oral QHS  . sertraline  100 mg Oral QHS    Admission status: Inpatient: Based on patients clinical presentation and evaluation of above clinical data, I have made determination that patient meets Inpatient criteria at this time.  Time spent: 20 min  [facility] Pager [phone]. If 7PM-7AM, please contact night-coverage at [email or web address], Office  [phone]  password [email or web address]  03/20/2019, 7:57 PM  LOS: 1 day

## 2019-03-20 NOTE — Discharge Instructions (Signed)
[facility] ______[facility] °LAPAROSCOPIC SURGERY: POST OP INSTRUCTIONS °Always review your discharge instruction sheet given to you by the facility where your surgery was performed. °IF YOU HAVE DISABILITY OR FAMILY LEAVE FORMS, YOU MUST BRING THEM TO THE OFFICE FOR PROCESSING.   °DO NOT GIVE THEM TO YOUR DOCTOR. ° °1. A prescription for pain medication may be given to you upon discharge.  Take your pain medication as prescribed, if needed.  If narcotic pain medicine is not needed, then you may take acetaminophen (Tylenol) or ibuprofen (Advil) as needed. °2. Take your usually prescribed medications unless otherwise directed. °3. If you need a refill on your pain medication, please contact your pharmacy.  They will contact our office to request authorization. Prescriptions will not be filled after 5pm or on week-ends. °4. You should follow a light diet the first few days after arrival home, such as soup and crackers, etc.  Be sure to include lots of fluids daily. °5. Most patients will experience some swelling and bruising in the area of the incisions.  Ice packs will help.  Swelling and bruising can take several days to resolve.  °6. It is common to experience some constipation if taking pain medication after surgery.  Increasing fluid intake and taking a stool softener (such as Colace) will usually help or prevent this problem from occurring.  A mild laxative (Milk of Magnesia or Miralax) should be taken according to package instructions if there are no bowel movements after 48 hours. °7. Unless discharge instructions indicate otherwise, you may remove your bandages 24-48 hours after surgery, and you may shower at that time.  You may have steri-strips (small skin tapes) in place directly over the incision.  These strips should be left on the skin for 7-10 days.  If your surgeon used skin glue on the incision, you may shower in 24 hours.  The glue will flake off over the next 2-3 weeks.  Any sutures or  staples will be removed at the office during your follow-up visit. °8. ACTIVITIES:  You may resume regular (light) daily activities beginning the next day--such as daily self-care, walking, climbing stairs--gradually increasing activities as tolerated.  You may have sexual intercourse when it is comfortable.  Refrain from any heavy lifting or straining until approved by your doctor. °a. You may drive when you are no longer taking prescription pain medication, you can comfortably wear a seatbelt, and you can safely maneuver your car and apply brakes. °b. RETURN TO WORK:  __________________________________________________________ °9. You should see your doctor in the office for a follow-up appointment approximately 2-3 weeks after your surgery.  Make sure that you call for this appointment within a day or two after you arrive home to insure a convenient appointment time. °10. OTHER INSTRUCTIONS: __________________________________________________________________________________________________________________________ __________________________________________________________________________________________________________________________ °WHEN TO CALL YOUR DOCTOR: °1. Fever over 101.0 °2. Inability to urinate °3. Continued bleeding from incision. °4. Increased pain, redness, or drainage from the incision. °5. Increasing abdominal pain ° °The clinic staff is available to answer your questions during regular business hours.  Please don’t hesitate to call and ask to speak to one of the nurses for clinical concerns.  If you have a medical emergency, go to the nearest emergency room or call 911.  A surgeon from [facility] is always on call at the hospital. °[address], [address], [place], [place]  [address] ? [address] [address], [place], [place]   [address] °[phone] ? [phone] ? FAX [phone] °Web site:   [email or web address] °

## 2019-03-20 NOTE — Op Note (Signed)
Operative Note  DERVIN VORE 78 y.o. 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 Prophylactic antibiotics were administered. SCD's were applied. General endotracheal anesthesia was initiated and a formal time-out was performed. The abdomen was prepped and draped in the usual sterile fashion and the abdomen was entered using an infraumbilical veress needle after instilling the site with local. Insufflation to 54mmHg was obtained and gross inspection revealed no evidence of injury from our entry or other intraabdominal abnormalities. Two 55mm trocars were introduced in the right midclavicular and right anterior axillary lines under direct visualization and following infiltration with local. An 78mm trocar was placed in the epigastrium.  An additional 5 mm trochar was ultimately placed in the right midabdomen to aid in retraction and visualization given this unusually difficult case.   The gallbladder was tensely distended and encased in dense omental adhesions. The Nezhat aspirator was used to decompress the gallbladder so that the fundus could be grasped and retracted cephalad. The omental and mesenteric adhesions to the gallbladder were carefully taken down with blunt dissection and cautery where indicated, ensuring no injury to  the adjacent viscera. As this blunt dissection progressed we were able to identify the infundibulum and retract this laterally.  The peritoneum overlying the cystic triangle was very thickened and firm consistent with chronic inflammation and was very difficult to peel-away from the underlying structures.  We kept working in small translucent layers on the anterior and posterior surface of the gallbladder using mostly blunt dissection until I had isolated what appeared to be a cystic artery as well as tapering of the infundibulum consistent with cystic duct.  The gallbladder was lifted off the cystic plate.  What appeared to be the proximal cystic duct seem to be densely adherent to the bulb of the duodenum and I could not safely separate these 2 structures without significant risk of injury to the duodenum.  I proceeded to continue dissecting the gallbladder off of the liver bed which was made quite difficult by the friable tissues in the liver bed and there was a fair amount of bleeding from this dissection.  Ultimately we were able to free the entire gallbladder off of the liver bed so that it was connected to the patient only by the cystic artery and the cystic duct which was densely adherent and scarred to the duodenum.  The artery was ligated with 2 clips proximally and 1 distally and then divided sharply.  The cystic duct structure was about 1.5 to 2 cm across and somewhat thickened, and therefore this was divided with a blue load GIA stapler just proximal to where it scars onto the duodenum.  The gallbladder was then placed in an Endo Catch bag and removed through the epigastric trocar site.     The liver bed and right upper quadrant were then inspected and hemostasis achieved with cautery.  A piece of Surgicel snow was also placed in  the liver bed.  The right upper quadrant was copiously irrigated and aspirated and the effluent was clear.  The staple line appears intact and hemostatic.  A 19 French round  Blake drain is introduced and placed in the gallbladder fossa and exits the right mid abdominal trocar site.  This is secured to the skin with a 2-0 nylon.   The 35mm trocar site in the epigastrium was closed with a 0 vicryl in the fascia under direct visualization using a PMI device. The abdomen was desufflated and all trocars removed. The skin incisions were closed with running subcuticular monocryl and Dermabond. The patient was awakened, extubated and transported to the recovery room in stable condition.   All counts were correct at the completion of the case.

## 2019-03-20 NOTE — Anesthesia Procedure Notes (Signed)
Date/Time: 03/20/2019 3:45 PM Performed by: Cynda Familia, CRNA Oxygen Delivery Method: Simple face mask Placement Confirmation: positive ETCO2 and breath sounds checked- equal and bilateral Dental Injury: Teeth and Oropharynx as per pre-operative assessment

## 2019-03-20 NOTE — Anesthesia Procedure Notes (Signed)
Procedure Name: Intubation Date/Time: 03/20/2019 1:57 PM Performed by: Eben Burow, CRNA Pre-anesthesia Checklist: Patient identified, Emergency Drugs available, Suction available, Patient being monitored and Timeout performed Patient Re-evaluated:Patient Re-evaluated prior to induction Oxygen Delivery Method: Circle system utilized Preoxygenation: Pre-oxygenation with 100% oxygen Induction Type: IV induction and Rapid sequence Laryngoscope Size: Glidescope and 4 Grade View: Grade I Tube type: Oral Tube size: 7.5 mm Number of attempts: 1 Airway Equipment and Method: Stylet Placement Confirmation: ETT inserted through vocal cords under direct vision,  positive ETCO2 and breath sounds checked- equal and bilateral Secured at: 23 cm Tube secured with: Tape Dental Injury: Teeth and Oropharynx as per pre-operative assessment

## 2019-03-20 NOTE — Progress Notes (Signed)
On call made aware of BP 177/96, 175/103, patient asymptomatic. New order: one time dose Hydralazine given. Will continue to monitor.  Norlene Duel RN, BSN

## 2019-03-20 NOTE — Transfer of Care (Signed)
Immediate Anesthesia Transfer of Care Note  Patient: Gary [name]  Procedure(s) Performed: LAPAROSCOPIC CHOLECYSTECTOMY (N/A Abdomen)  Patient Location: PACU  Anesthesia Type:General  Level of Consciousness: sedated  Airway & Oxygen Therapy: Patient Spontanous Breathing and Patient connected to face mask oxygen  Post-op Assessment: Report given to RN and Post -op Vital signs reviewed and stable  Post vital signs: Reviewed and stable  Last Vitals:  Vitals Value Taken Time  BP 164/88 03/20/2019  3:50 PM  Temp    Pulse 59 03/20/2019  3:52 PM  Resp 9 03/20/2019  3:52 PM  SpO2 100 % 03/20/2019  3:52 PM  Vitals shown include unvalidated device data.  Last Pain:  Vitals:   03/20/19 1300  TempSrc: Oral  PainSc:          Complications: No apparent anesthesia complications

## 2019-03-20 NOTE — Interval H&P Note (Signed)
History and Physical Interval Note:  03/20/2019 1:18 PM  Gary [name]  has presented today for surgery, with the diagnosis of CHOLECYSTITIS.  The various methods of treatment have been discussed with the patient and family. After consideration of risks, benefits and other options for treatment, the patient has consented to  Procedure(s): LAPAROSCOPIC CHOLECYSTECTOMY (N/A) as a surgical intervention.  The patient's history has been reviewed, patient examined, no change in status, stable for surgery.  I have reviewed the patient's chart and labs.  Questions were answered to the patient's satisfaction.  He has been evaluated by cardiology and no further cardiac evaluation is needed. He last took xarelto on Sunday evening 5/10.    Virdell Hoiland Rich Brave

## 2019-03-21 ENCOUNTER — Encounter (HOSPITAL_COMMUNITY): Payer: Self-pay | Admitting: Surgery

## 2019-03-21 DIAGNOSIS — J189 Pneumonia, unspecified organism: Secondary | ICD-10-CM

## 2019-03-21 LAB — HEPATIC FUNCTION PANEL
ALT: 28 U/L (ref 0–44)
AST: 67 U/L — ABNORMAL HIGH (ref 15–41)
Albumin: 3 g/dL — ABNORMAL LOW (ref 3.5–5.0)
Alkaline Phosphatase: 68 U/L (ref 38–126)
Bilirubin, Direct: 0.1 mg/dL (ref 0.0–0.2)
Indirect Bilirubin: 0.5 mg/dL (ref 0.3–0.9)
Total Bilirubin: 0.6 mg/dL (ref 0.3–1.2)
Total Protein: 6.5 g/dL (ref 6.5–8.1)

## 2019-03-21 LAB — BASIC METABOLIC PANEL
Anion gap: 11 (ref 5–15)
BUN: 11 mg/dL (ref 8–23)
CO2: 26 mmol/L (ref 22–32)
Calcium: 9.1 mg/dL (ref 8.9–10.3)
Chloride: 102 mmol/L (ref 98–111)
Creatinine, Ser: 1.3 mg/dL — ABNORMAL HIGH (ref 0.61–1.24)
GFR calc Af Amer: >60 mL/min (ref >60)
GFR calc non Af Amer: 52 mL/min — ABNORMAL LOW (ref >60)
Glucose, Bld: 141 mg/dL — ABNORMAL HIGH (ref 70–99)
Potassium: 4.3 mmol/L (ref 3.5–5.1)
Sodium: 139 mmol/L (ref 135–145)

## 2019-03-21 LAB — CBC
HCT: 37.6 % — ABNORMAL LOW (ref 39.0–52.0)
Hemoglobin: 11.9 g/dL — ABNORMAL LOW (ref 13.0–17.0)
MCH: 31.7 pg (ref 26.0–34.0)
MCHC: 31.6 g/dL (ref 30.0–36.0)
MCV: 100.3 fL — ABNORMAL HIGH (ref 80.0–100.0)
Platelets: 240 10*3/uL (ref 150–400)
RBC: 3.75 MIL/uL — ABNORMAL LOW (ref 4.22–5.81)
RDW: 14.9 % (ref 11.5–15.5)
WBC: 10.7 10*3/uL — ABNORMAL HIGH (ref 4.0–10.5)
nRBC: 0 % (ref 0.0–0.2)

## 2019-03-21 MED ORDER — GELATIN ABSORBABLE 12-7 MM EX MISC
1.0000 | Freq: Once | CUTANEOUS | Status: AC
  Administered 2019-03-21: 12:00:00 1 via TOPICAL
  Filled 2019-03-21: qty 1

## 2019-03-21 MED ORDER — ACETAMINOPHEN 500 MG PO TABS: 1000.0000 mg | ORAL_TABLET | Freq: Three times a day (TID) | ORAL | Status: AC

## 2019-03-21 MED ORDER — ACETAMINOPHEN 500 MG PO TABS
1000.0000 mg | ORAL_TABLET | Freq: Three times a day (TID) | ORAL | Status: DC
  Filled 2019-03-21 (×5): qty 2

## 2019-03-21 NOTE — Progress Notes (Signed)
1 Day Post-Op    CC: Nausea, vomiting and diarrhea, cough  Subjective: He looks pretty good this AM.  Blood in the JP and still bleeding around the JP insertion site(port)  He is sore and has not been OOB yet.  Tolerating the clears and looks like he is ready for more.  He complains of not being able to sleep long term issue he resolves with [place] Mist (2 shots) each night at home for the last 5 years.  He drank more in his past.    Objective: Vital signs in last 24 hours: Temp:  [97.5 F (36.4 C)-99.5 F (37.5 C)] 98.6 F (37 C) (05/14 0545) Pulse Rate:  [57-118] 90 (05/14 0545) Resp:  [6-18] 14 (05/14 0545) BP: (147-177)/(87-114) 147/97 (05/14 0545) SpO2:  [97 %-100 %] 98 % (05/14 0545) Last BM Date: 03/20/19 240 Po 2900 IV 1550 urine 120 drain  Afebrile, BP up last PM, still on O2 Creatinine 1.3 Intake/Output from previous day: 05/13 0701 - 05/14 0700 In: 3190 [P.O.:240; I.V.:2850; IV Piggyback:100] Out: 1870 [Urine:1550; Drains:120; Blood:200] Intake/Output this shift: No intake/output data recorded.  General appearance: alert, cooperative and no distress Resp: clear to auscultation bilaterally GI: soft, tender, port sites OK, drain has blood in it, I stripped the drain.  He is also bleeding around the JP.    Lab Results:  Recent Labs    03/20/19 0519 03/21/19 0413  WBC 6.2 10.7*  HGB 12.2* 11.9*  HCT 38.1* 37.6*  PLT 223 240    BMET Recent Labs    03/20/19 0519 03/21/19 0413  NA 138 139  K 3.8 4.3  CL 102 102  CO2 26 26  GLUCOSE 130* 141*  BUN 9 11  CREATININE 1.42* 1.30*  CALCIUM 9.1 9.1   PT/INR Recent Labs    03/19/19 0731  LABPROT 14.0  INR 1.1    Recent Labs  Lab 03/19/19 0043 03/20/19 0519 03/21/19 0413  AST 39 28 67*  ALT 20 17 28   ALKPHOS 89 75 68  BILITOT 0.5 0.8 0.6  PROT 6.8 6.6 6.5  ALBUMIN 3.3* 3.1* 3.0*     Lipase     Component Value Date/Time   LIPASE 28 03/19/2019 0043     Prior to Admission medications    Medication Sig Start Date End Date Taking? Authorizing Provider  acyclovir (ZOVIRAX) 400 MG tablet Take 400 mg by mouth 2 (two) times daily.  03/01/13  Yes [provider]  aspirin EC 81 MG tablet Take 81 mg by mouth daily.   Yes [provider]  atorvastatin (LIPITOR) 40 MG tablet Take 40 mg by mouth 2 (two) times daily.  09/09/16  Yes [provider]  Calcium Carbonate-Vitamin D (CALCIUM-D) 600-400 MG-UNIT TABS Take 1 tablet by mouth daily.    Yes [provider]  EPINEPHrine 0.3 mg/0.3 mL IJ SOAJ injection Inject 0.3 mg into the muscle as directed.  05/01/14  Yes [provider]  magnesium oxide (MAG-OX) 400 MG tablet Take 400 mg by mouth at bedtime.   Yes [provider]  meclizine (ANTIVERT) 25 MG tablet Take 25 mg by mouth every 12 (twelve) hours as needed for dizziness.    Yes [provider]  mirtazapine (REMERON) 15 MG tablet Take 7.5 mg by mouth at bedtime.    Yes [provider]  Multiple Vitamin (MULTIVITAMIN WITH MINERALS) TABS tablet Take 1 tablet by mouth daily.   Yes [provider]  omega-3 acid ethyl esters (LOVAZA) 1  G capsule Take 1 g by mouth daily.    Yes [provider]  oxyCODONE (ROXICODONE) 15 MG immediate release tablet Take 15 mg by mouth 4 (four) times daily as needed for pain.    Yes [provider]  pantoprazole (PROTONIX) 40 MG tablet Take 40 mg by mouth 2 (two) times daily. 01/14/19  Yes [provider]  Polyethyl Glycol-Propyl Glycol (SYSTANE) 0.4-0.3 % GEL ophthalmic gel Place 1 application into both eyes as needed (dry eyes).   Yes [provider]  predniSONE (DELTASONE) 5 MG tablet Take 5 mg by mouth at bedtime.    Yes [provider]  propranolol (INDERAL) 10 MG tablet Take 10 mg by mouth 2 (two) times daily.   Yes [provider]  sertraline (ZOLOFT) 100 MG tablet Take 100 mg by mouth at bedtime.  11/20/12  Yes [provider]  XARELTO 10 MG TABS tablet Take 10 mg by mouth daily. 01/03/19  Yes [provider]  clobetasol (TEMOVATE) 0.05 % external solution Apply 1 application topically as needed. 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 For anxiety Patient taking differently: Take 5 mg by mouth at bedtime as needed (sleep). For anxiety 12/25/12   Barton Dubois, MD  levothyroxine (SYNTHROID, LEVOTHROID) 112 MCG tablet Take 112 mcg daily before breakfast by mouth.    [provider]  lisinopril (ZESTRIL) 5 MG tablet Take 5 mg by mouth daily. 02/12/19   [provider]    Medications: . atorvastatin  40 mg Oral q1800  . doxycycline  100 mg Oral Q12H  . levothyroxine  112 mcg Oral QAC breakfast  . mirtazapine  7.5 mg Oral QHS  . multivitamin with minerals  1 tablet Oral Daily  . omega-3 acid ethyl esters  1 g Oral Daily  . predniSONE  5 mg Oral QHS  . sertraline  100 mg Oral QHS   . sodium chloride 100 mL/hr at 03/21/19 0511  . piperacillin-tazobactam (ZOSYN)  IV 3.375 g (03/21/19 0507)   Assessment/Plan Community acquired pneumonia/nondular density on CT  - negative COVID test PAF - on chronic anticoagulation(Xarelto) CAD/4 stents Rheumatoid arthritis - on chronic prednisone CKD - creatinine 1.49>>1.42>>1.30 (5/14) Hypothyroid  ETOH use - 2 shots of [place] Mist each night to sleep x 5 Plus years Heavier ETOH use in the past  Acute on chronic cholecystitis with concrete adhesion of cystic duct to duodenal bulb Laparoscopic cholecystectomy, 03/20/19, Dr.Chelsea Kae Heller POD#1  FEN:  IV fluids/Clear liquids ID: Doxycycline tabs 5/12 >> day 3; Zosyn 5/12 >> day 3 DVT:  SCD's - off Xarelto Follow up:  [facility] [place]: [place],Rick Son [phone]  [phone]  [place], [place] [phone]  [phone]    Plan:  Low fat diet, clean JP site and redress,  I will get some gelfoam to put around the site. I would hold off on anticoagulation for now  He is getting Valium for sleep, but not working well for him.  He says they tried multiple sleep aids and nothing but ETOH seemed to help.  Will work on pain control and mobilizing him today.  Recheck labs in AM. I will defer sleep aids to Dr. Darrick Meigs.      LOS: 2 days    Autumn Pruitt 03/21/2019 [phone]

## 2019-03-21 NOTE — Progress Notes (Signed)
Progress Note  Patient Name: Gary [name] Date of Encounter: 03/21/2019  Primary Cardiologist: Franchot Mimes, MD  [facility]  Subjective   No cardiac complaints   Inpatient Medications    Scheduled Meds: . atorvastatin  40 mg Oral q1800  . doxycycline  100 mg Oral Q12H  . levothyroxine  112 mcg Oral QAC breakfast  . mirtazapine  7.5 mg Oral QHS  . multivitamin with minerals  1 tablet Oral Daily  . omega-3 acid ethyl esters  1 g Oral Daily  . predniSONE  5 mg Oral QHS  . sertraline  100 mg Oral QHS   Continuous Infusions: . sodium chloride 100 mL/hr at 03/21/19 0511  . piperacillin-tazobactam (ZOSYN)  IV 3.375 g (03/21/19 0507)   PRN Meds: acetaminophen **OR** acetaminophen, diazepam, meclizine, morphine injection, ondansetron **OR** ondansetron (ZOFRAN) IV, oxyCODONE   Vital Signs    Vitals:   03/21/19 0237 03/21/19 0338 03/21/19 0500 03/21/19 0545  BP: (!) 151/94 (!) 153/95 (!) 159/94 (!) 147/97  Pulse: 86 83 88 90  Resp: 14 14 14 14   Temp: 99.5 F (37.5 C) 99.2 F (37.3 C) 98.9 F (37.2 C) 98.6 F (37 C)  TempSrc: Oral Oral Oral Oral  SpO2: 98% 99% 99% 98%  Weight:      Height:        Intake/Output Summary (Last 24 hours) at 03/21/2019 0818 Last data filed at 03/21/2019 0600 Gross per 24 hour  Intake 3190 ml  Output 1870 ml  Net 1320 ml   Last 3 Weights 03/19/2019 10/05/2017 09/19/2017  Weight (lbs) 170 lb 176 lb 176 lb  Weight (kg) 77.111 kg 79.833 kg 79.833 kg      Telemetry    NSR . - Personally Reviewed  ECG    NSR no acute changes  - Personally Reviewed  Physical Exam  Post Lap choly GEN: No acute distress.   Neck: No JVD Cardiac: RRR, no murmurs, rubs, or gallops.  Respiratory: Clear to auscultation bilaterally. GI: JP drain still in place  MS: No edema; No deformity. Neuro:  Nonfocal  Psych: Normal affect   Labs    Chemistry Recent Labs  Lab 03/19/19 0043 03/20/19 0519 03/21/19 0413  NA 138 138 139  K 3.7 3.8 4.3   CL 102 102 102  CO2 27 26 26   GLUCOSE 158* 130* 141*  BUN 14 9 11   CREATININE 1.49* 1.42* 1.30*  CALCIUM 9.1 9.1 9.1  PROT 6.8 6.6 6.5  ALBUMIN 3.3* 3.1* 3.0*  AST 39 28 67*  ALT 20 17 28   ALKPHOS 89 75 68  BILITOT 0.5 0.8 0.6  GFRNONAA 44* 47* 52*  GFRAA 51* 54* >60  ANIONGAP 9 10 11      Hematology Recent Labs  Lab 03/19/19 0043 03/20/19 0519 03/21/19 0413  WBC 6.6 6.2 10.7*  RBC 3.80* 3.77* 3.75*  HGB 12.1* 12.2* 11.9*  HCT 38.2* 38.1* 37.6*  MCV 100.5* 101.1* 100.3*  MCH 31.8 32.4 31.7  MCHC 31.7 32.0 31.6  RDW 14.7 15.0 14.9  PLT 226 223 240    Cardiac EnzymesNo results for input(s): TROPONINI in the last 168 hours. No results for input(s): TROPIPOC in the last 168 hours.   BNPNo results for input(s): BNP, PROBNP in the last 168 hours.   DDimer No results for input(s): DDIMER in the last 168 hours.   Radiology    Nm Hepato W/eject Fract  Result Date: 03/19/2019 CLINICAL DATA:  RIGHT upper quadrant pain. Cholelithiasis on ultrasound. Pericholecystic  fluid on CT examination. EXAM: NUCLEAR MEDICINE HEPATOBILIARY IMAGING TECHNIQUE: Sequential images of the abdomen were obtained out to 60 minutes following intravenous administration of radiopharmaceutical. 3 mg IV morphine RADIOPHARMACEUTICALS:  5.5 mCi Tc-24m Choletec IV (booster dose of 1.1 millicurie Choletec administered after 90 minutes) COMPARISON:  CT 03/19/2019, ultrasound 03/19/2019 FINDINGS: There is prompt clearance radiotracer from the blood pool and homogeneous uptake in the liver. Counts are evident in the common bile duct and small bowel by 30 minutes. The gallbladder is not visualized at 90 minutes. At 90 minutes, IV morphine was administered to contract the sphincter of Oddi. 30 minutes of imaging following IV administration of morphine fail to demonstrate filling of the gallbladder. IMPRESSION: Non filling of the gallbladder indicates obstruction of the cystic duct. Findings consistent with ACUTE  CHOLECYSTITIS. These results will be called to the ordering clinician or representative by the Radiologist Assistant, and communication documented in the PACS or zVision Dashboard. Electronically Signed   By: Suzy Bouchard M.D.   On: 03/19/2019 15:11    Cardiac Studies   None  Patient Profile     78 y.o. male with history of CAD stent to LAD 2017 and POB to OM2. History of PAF and noraml EF post lap choly Day #1  Assessment & Plan    CAD:  Stable no angina continue ASA, statin , beta blocker PAF:  Only on xarelto 10 mg on admission can resume in 2-3 days pending when JP  Drain out and felt safe by surgery GB:  Tough procedure with GB adhered to liver plan per Dr Rayburn Go       For questions or updates, please contact [facility] Please consult [email or web address] for contact info under        Signed, Jenkins Rouge, MD  03/21/2019, 8:18 AM

## 2019-03-21 NOTE — Progress Notes (Signed)
[facility]  PROGRESS NOTE  Gary [name] GGE:[id number] DOB: [birth year] DOA: 03/19/2019 PCP: Deland Pretty, MD   Brief HPI:   78 year old male with history of CAD, status post stent x4, proximal atrial fibrillation on Xarelto, remote arthritis on prednisone, hypertension, HCV, head and neck CA in remission, CKD stage III came to ED with complaints of nausea vomiting and diarrhea.  With diffuse abdominal pain.HIDA scan came back positive for acute cholecystitis    Subjective   Patient seen and examined, status post cholecystectomy.   Assessment/Plan:     1. Acute cholecystitis/status post cholecystectomy-HIDA scan was positive for acute cholecystitis.  Patient started on Zosyn and doxycycline.  General surgery following.  2. Community-acquired pneumonia-nodular density seen on the CT chest.  Started on Zosyn and doxycycline.  COVID-19 test was negative.  3. Paroxysmal atrial fibrillation-continue metoprolol.  Xarelto is on hold.  4. CAD status post stents x 4-cardiology was consulted patient deemed moderate risk given history of CAD.  Xarelto was held.  Continued on aspirin and statin.  5. Rheumatoid arthritis-continue prednisone at home dose.  6. Pulmonary nodule-likely infectious, will need repeat CT in 6 months.  Patient has history of head and neck cancer.       CBG: No results for input(s): GLUCAP in the last 168 hours.  CBC: Recent Labs  Lab 03/19/19 0043 03/20/19 0519 03/21/19 0413  WBC 6.6 6.2 10.7*  NEUTROABS 4.8  --   --   HGB 12.1* 12.2* 11.9*  HCT 38.2* 38.1* 37.6*  MCV 100.5* 101.1* 100.3*  PLT 226 223 465    Basic Metabolic Panel: Recent Labs  Lab 03/19/19 0043 03/20/19 0519 03/21/19 0413  NA 138 138 139  K 3.7 3.8 4.3  CL 102 102 102  CO2 27 26 26   GLUCOSE 158* 130* 141*  BUN 14 9 11   CREATININE 1.49* 1.42* 1.30*  CALCIUM 9.1 9.1 9.1     DVT prophylaxis: SCDs  Code Status: Full code  Family Communication: No family at  bedside  Disposition Plan: likely home when medically ready for discharge     Consultants:  General surgery  Procedures:     Antibiotics:   Anti-infectives (From admission, onward)   Start     Dose/Rate Route Frequency Ordered Stop   03/19/19 2200  doxycycline (VIBRA-TABS) tablet 100 mg     100 mg Oral Every 12 hours 03/19/19 0554     03/19/19 1100  piperacillin-tazobactam (ZOSYN) IVPB 3.375 g     3.375 g 12.5 mL/hr over 240 Minutes Intravenous Every 8 hours 03/19/19 0603     03/19/19 0415  piperacillin-tazobactam (ZOSYN) IVPB 3.375 g     3.375 g 100 mL/hr over 30 Minutes Intravenous  Once 03/19/19 0403 03/19/19 0514   03/19/19 0415  doxycycline (VIBRAMYCIN) 100 mg in sodium chloride 0.9 % 250 mL IVPB     100 mg 125 mL/hr over 120 Minutes Intravenous  Once 03/19/19 0404 03/19/19 0728       Objective   Vitals:   03/21/19 0338 03/21/19 0500 03/21/19 0545 03/21/19 1304  BP: (!) 153/95 (!) 159/94 (!) 147/97 (!) 136/92  Pulse: 83 88 90 100  Resp: 14 14 14 18   Temp: 99.2 F (37.3 C) 98.9 F (37.2 C) 98.6 F (37 C) 98 F (36.7 C)  TempSrc: Oral Oral Oral Oral  SpO2: 99% 99% 98% 93%  Weight:      Height:        Intake/Output Summary (Last 24 hours) at 03/21/2019 1445  Last data filed at 03/21/2019 1341 Gross per 24 hour  Intake 3010 ml  Output 1520 ml  Net 1490 ml   Filed Weights   03/19/19 0024  Weight: 77.1 kg     Physical Examination:   General: Appears in no acute distress  Cardiovascular: S1-S2, regular  Respiratory: Clear to auscultation bilaterally, no wheezing or crackles  Abdomen: Tenderness in right upper quadrant  Extremities: No edema in the lower extremities      Data Reviewed: I have personally reviewed following labs and imaging studies   Recent Results (from the past 240 hour(s))  SARS Coronavirus 2 (CEPHEID - Performed in [facility] hospital lab), Hosp Order     Status: None   Collection Time: 03/19/19 12:44 AM  Result  Value Ref Range Status   SARS Coronavirus 2 NEGATIVE NEGATIVE Final    Comment: (NOTE) If result is NEGATIVE SARS-CoV-2 target nucleic acids are NOT DETECTED. The SARS-CoV-2 RNA is generally detectable in upper and lower  respiratory specimens during the acute phase of infection. The lowest  concentration of SARS-CoV-2 viral copies this assay can detect is 250  copies / mL. A negative result does not preclude SARS-CoV-2 infection  and should not be used as the sole basis for treatment or other  patient management decisions.  A negative result may occur with  improper specimen collection / handling, submission of specimen other  than nasopharyngeal swab, presence of viral mutation(s) within the  areas targeted by this assay, and inadequate number of viral copies  (<250 copies / mL). A negative result must be combined with clinical  observations, patient history, and epidemiological information. If result is POSITIVE SARS-CoV-2 target nucleic acids are DETECTED. The SARS-CoV-2 RNA is generally detectable in upper and lower  respiratory specimens dur ing the acute phase of infection.  Positive  results are indicative of active infection with SARS-CoV-2.  Clinical  correlation with patient history and other diagnostic information is  necessary to determine patient infection status.  Positive results do  not rule out bacterial infection or co-infection with other viruses. If result is PRESUMPTIVE POSTIVE SARS-CoV-2 nucleic acids MAY BE PRESENT.   A presumptive positive result was obtained on the submitted specimen  and confirmed on repeat testing.  While 2019 novel coronavirus  (SARS-CoV-2) nucleic acids may be present in the submitted sample  additional confirmatory testing may be necessary for epidemiological  and / or clinical management purposes  to differentiate between  SARS-CoV-2 and other Sarbecovirus currently known to infect humans.  If clinically indicated additional testing  with an alternate test  methodology [phone]) is advised. The SARS-CoV-2 RNA is generally  detectable in upper and lower respiratory sp ecimens during the acute  phase of infection. The expected result is Negative. Fact Sheet for Patients:  StrictlyIdeas.no Fact Sheet for Healthcare Providers: BankingDealers.co.za This test is not yet approved or cleared by the [place] FDA and has been authorized for detection and/or diagnosis of SARS-CoV-2 by FDA under an Emergency Use Authorization (EUA).  This EUA will remain in effect (meaning this test can be used) for the duration of the COVID-19 declaration under Section 564(b)(1) of the Act, 21 U.S.C. section 360bbb-3(b)(1), unless the authorization is terminated or revoked sooner. Performed at [facility], [place] [address] [place], [place] [address]   Blood culture (routine x 2)     Status: None (Preliminary result)   Collection Time: 03/19/19  4:29 AM  Result Value Ref Range Status   Specimen  Description   Final    BLOOD RIGHT ARM Performed at [facility] [address], [place], [place] [address]    Special Requests   Final    BOTTLES DRAWN AEROBIC AND ANAEROBIC Blood Culture adequate volume Performed at [facility] [address] [place], [place] [address]    Culture   Final    NO GROWTH 2 DAYS Performed at [facility] [address], [place], [place] [address]    Report Status PENDING  Incomplete  Blood culture (routine x 2)     Status: None (Preliminary result)   Collection Time: 03/19/19  4:37 AM  Result Value Ref Range Status   Specimen Description   Final    BLOOD RIGHT ANTECUBITAL Performed at [facility] [address] [place], [place] [address]    Special Requests   Final    BOTTLES DRAWN AEROBIC AND ANAEROBIC Blood Culture results may not be optimal due to an excessive volume of  blood received in culture bottles Performed at [facility] [address] [place], [place] [address]    Culture   Final    NO GROWTH 2 DAYS Performed at [facility] [address], [place], [place] [address]    Report Status PENDING  Incomplete  Surgical pcr screen     Status: None   Collection Time: 03/20/19  4:25 AM  Result Value Ref Range Status   MRSA, PCR NEGATIVE NEGATIVE Final   Staphylococcus aureus NEGATIVE NEGATIVE Final    Comment: (NOTE) The Xpert SA Assay (FDA approved for NASAL specimens in patients 53 years of age and older), is one component of a comprehensive surveillance program. It is not intended to diagnose infection nor to guide or monitor treatment. Performed at [facility], [place] [address], [place], [place] [address]      Liver Function Tests: Recent Labs  Lab 03/19/19 0043 03/20/19 0519 03/21/19 0413  AST 39 28 67*  ALT 20 17 28   ALKPHOS 89 75 68  BILITOT 0.5 0.8 0.6  PROT 6.8 6.6 6.5  ALBUMIN 3.3* 3.1* 3.0*   Recent Labs  Lab 03/19/19 0043  LIPASE 28   No results for input(s): AMMONIA in the last 168 hours.  Cardiac Enzymes: No results for input(s): CKTOTAL, CKMB, CKMBINDEX, TROPONINI in the last 168 hours. BNP (last 3 results) No results for input(s): BNP in the last 8760 hours.  ProBNP (last 3 results) No results for input(s): PROBNP in the last 8760 hours.    Studies: Nm Hepato W/eject Fract  Result Date: 03/19/2019 CLINICAL DATA:  RIGHT upper quadrant pain. Cholelithiasis on ultrasound. Pericholecystic fluid on CT examination. EXAM: NUCLEAR MEDICINE HEPATOBILIARY IMAGING TECHNIQUE: Sequential images of the abdomen were obtained out to 60 minutes following intravenous administration of radiopharmaceutical. 3 mg IV morphine RADIOPHARMACEUTICALS:  5.5 mCi Tc-107m Choletec IV (booster dose of 1.1 millicurie Choletec administered after 90 minutes) COMPARISON:  CT 03/19/2019, ultrasound  03/19/2019 FINDINGS: There is prompt clearance radiotracer from the blood pool and homogeneous uptake in the liver. Counts are evident in the common bile duct and small bowel by 30 minutes. The gallbladder is not visualized at 90 minutes. At 90 minutes, IV morphine was administered to contract the sphincter of Oddi. 30 minutes of imaging following IV administration of morphine fail to demonstrate filling of the gallbladder. IMPRESSION: Non filling of the gallbladder indicates obstruction of the cystic duct. Findings consistent with ACUTE CHOLECYSTITIS. These  results will be called to the ordering clinician or representative by the Radiologist Assistant, and communication documented in the PACS or zVision Dashboard. Electronically Signed   By: Suzy Bouchard M.D.   On: 03/19/2019 15:11    Scheduled Meds: . acetaminophen  1,000 mg Oral Q8H  . [START ON 03/22/2019] acetaminophen  1,000 mg Oral Q8H  . atorvastatin  40 mg Oral q1800  . doxycycline  100 mg Oral Q12H  . levothyroxine  112 mcg Oral QAC breakfast  . mirtazapine  7.5 mg Oral QHS  . multivitamin with minerals  1 tablet Oral Daily  . omega-3 acid ethyl esters  1 g Oral Daily  . predniSONE  5 mg Oral QHS  . sertraline  100 mg Oral QHS    Admission status: Inpatient: Based on patients clinical presentation and evaluation of above clinical data, I have made determination that patient meets Inpatient criteria at this time.  Time spent: 20 min  [facility] Pager [phone]. If 7PM-7AM, please contact night-coverage at [email or web address], Office  [phone]  password [place]  03/21/2019, 2:45 PM  LOS: 2 days

## 2019-03-21 NOTE — Progress Notes (Signed)
On call made aware of BP 160/108, 115/114 and Pulse 118, MEWS red. Charge nurse made aware as well. Patient is asymptomatic, calm and has no complaints at this time. New orders for one time dose of metoprolol  5mg  given. MEWs now green will continue to monitor BP, and patient condition.   Norlene Duel RN, BSN

## 2019-03-21 NOTE — Evaluation (Signed)
Physical Therapy Evaluation Patient Details Name: Gary [name] MRN: [id number] DOB: 21-Jul-1941 Today's Date: 03/21/2019   History of Present Illness  78 yo admitted 03/19/19 with acute on chronic cholecystitis with concrete adhesion of cystic duct to duodenal bulb s/p Laparoscopic Cholecystectomy on 5/13.   PmHx: remote arthritis on prednisone, hypertension, HCV, head and neck CA in remission, CKD      Clinical Impression  Pt admitted with above diagnosis. Pt currently with functional limitations due to the deficits listed below (see PT Problem List).  Pt amb into hallway ~110' with RW and min assist, c/o back and right shoulder pain, hot packs provided for right shoulder and RN  gave meds; anticipate pt will be able to d/c home with HHPT, will need RW; will follow in acute setting.  Pt will benefit from skilled PT to increase their independence and safety with mobility to allow discharge to the venue listed below.      Follow Up Recommendations Home health PT    Equipment Recommendations  Rolling walker with 5" wheels    Recommendations for Other Services       Precautions / Restrictions Precautions Precautions: Other (comment) Precaution Comments: JP drain      Mobility  Bed Mobility Overal bed mobility: Needs Assistance Bed Mobility: Sit to Supine       Sit to supine: Min assist   General bed mobility comments: assist with LEs   Transfers Overall transfer level: Needs assistance Equipment used: Rolling walker (2 wheeled) Transfers: Sit to/from Stand Sit to Stand: Min assist;Min guard         General transfer comment: light assist to rise and transition to RW  Ambulation/Gait Ambulation/Gait assistance: Min assist;Min guard Gait Distance (Feet): 110 Feet Assistive device: Rolling walker (2 wheeled) Gait Pattern/deviations: Step-through pattern;Trunk flexed;Decreased stride length     General Gait Details: cues for RW safety with turns   Science writer    Modified Rankin (Stroke Patients Only)       Balance Overall balance assessment: Needs assistance Sitting-balance support: Feet supported Sitting balance-Leahy Scale: Fair       Standing balance-Leahy Scale: Poor Standing balance comment: reliant on UEs                              Pertinent Vitals/Pain Pain Assessment: Faces Faces Pain Scale: Hurts even more Pain Location: right shoulder and back  Pain Descriptors / Indicators: Grimacing;Guarding;Discomfort Pain Intervention(s): Monitored during session;Repositioned;RN gave pain meds during session    Home Living   Living Arrangements: Alone                    Prior Function Level of Independence: Independent;Independent with assistive device(s)         Comments: amb with cane at times inside and when going out     Hand Dominance        Extremity/Trunk Assessment   Upper Extremity Assessment Upper Extremity Assessment: Overall WFL for tasks assessed    Lower Extremity Assessment Lower Extremity Assessment: Generalized weakness       Communication   Communication: No difficulties  Cognition Arousal/Alertness: Awake/alert Behavior During Therapy: WFL for tasks assessed/performed Overall Cognitive Status: Within Functional Limits for tasks assessed  General Comments      Exercises     Assessment/Plan    PT Assessment Patient needs continued PT services  PT Problem List Decreased strength;Decreased range of motion;Decreased activity tolerance;Decreased mobility;Pain;Decreased knowledge of use of DME       PT Treatment Interventions DME instruction;Gait training;Functional mobility training;Therapeutic activities;Patient/family education;Therapeutic exercise    PT Goals (Current goals can be found in the Care Plan section)  Acute Rehab PT Goals Patient Stated Goal: home soon PT Goal  Formulation: With patient Time For Goal Achievement: 04/04/19 Potential to Achieve Goals: Good    Frequency Min 3X/week   Barriers to discharge        Co-evaluation               AM-PAC PT "6 Clicks" Mobility  Outcome Measure Help needed turning from your back to your side while in a flat bed without using bedrails?: A Little Help needed moving from lying on your back to sitting on the side of a flat bed without using bedrails?: A Little Help needed moving to and from a bed to a chair (including a wheelchair)?: A Little Help needed standing up from a chair using your arms (e.g., wheelchair or bedside chair)?: A Little Help needed to walk in hospital room?: A Little Help needed climbing 3-5 steps with a railing? : A Little 6 Click Score: 18    End of Session   Activity Tolerance: Patient limited by fatigue;Patient limited by pain Patient left: in bed;with call bell/phone within reach;with bed alarm set   PT Visit Diagnosis: Difficulty in walking, not elsewhere classified (R26.2)    Time: 8638-1771 PT Time Calculation (min) (ACUTE ONLY): 12 min   Charges:   PT Evaluation $PT Eval Low Complexity: 1 Low          Kenyon Ana, PT  Pager: [phone] Acute Rehab Dept [facility]): [phone]   03/21/2019   [facility] 03/21/2019, 2:59 PM

## 2019-03-22 LAB — COMPREHENSIVE METABOLIC PANEL
ALT: 20 U/L (ref 0–44)
AST: 33 U/L (ref 15–41)
Albumin: 2.5 g/dL — ABNORMAL LOW (ref 3.5–5.0)
Alkaline Phosphatase: 54 U/L (ref 38–126)
Anion gap: 7 (ref 5–15)
BUN: 16 mg/dL (ref 8–23)
CO2: 26 mmol/L (ref 22–32)
Calcium: 8.4 mg/dL — ABNORMAL LOW (ref 8.9–10.3)
Chloride: 105 mmol/L (ref 98–111)
Creatinine, Ser: 1.49 mg/dL — ABNORMAL HIGH (ref 0.61–1.24)
GFR calc Af Amer: 51 mL/min — ABNORMAL LOW (ref >60)
GFR calc non Af Amer: 44 mL/min — ABNORMAL LOW (ref >60)
Glucose, Bld: 121 mg/dL — ABNORMAL HIGH (ref 70–99)
Potassium: 4.3 mmol/L (ref 3.5–5.1)
Sodium: 138 mmol/L (ref 135–145)
Total Bilirubin: 0.8 mg/dL (ref 0.3–1.2)
Total Protein: 5.5 g/dL — ABNORMAL LOW (ref 6.5–8.1)

## 2019-03-22 NOTE — Care Management Important Message (Signed)
Important Message  Patient Details IM Letter given to Kathrin Greathouse SW to present to the Patient Name: Gary [name] MRN: [id number] Date of Birth: [birth year]   Medicare Important Message Given:  Yes    Kerin Salen 03/22/2019, 10:39 AM

## 2019-03-22 NOTE — Progress Notes (Signed)
Physical Therapy Treatment Patient Details Name: Gary [name] MRN: [id number] DOB: 11-21-1940 Today's Date: 03/22/2019    History of Present Illness 79 yo admitted 03/19/19 with acute on chronic cholecystitis with concrete adhesion of cystic duct to duodenal bulb s/p Laparoscopic Cholecystectomy on 5/13.   PmHx: remote arthritis on prednisone, hypertension, HCV, head and neck CA in remission, CKD        PT Comments    Pt assisted to bathroom for slight BM.  Pt then ambulated good distance in hallway.  Pt is requiring occasional min assist to stand and for steadying during ambulation.  Recommend initial assist for mobility upon d/c for safety.  Pt also reports he does not have a RW and would benefit from RW for d/c.   Follow Up Recommendations  Home health PT;Supervision for mobility/OOB     Equipment Recommendations  Rolling walker with 5" wheels    Recommendations for Other Services       Precautions / Restrictions Precautions Precautions: Other (comment);Fall Precaution Comments: JP drain    Mobility  Bed Mobility Overal bed mobility: Needs Assistance Bed Mobility: Supine to Sit     Supine to sit: HOB elevated;Min assist     General bed mobility comments: assist for trunk upright  Transfers Overall transfer level: Needs assistance Equipment used: Rolling walker (2 wheeled) Transfers: Sit to/from Stand Sit to Stand: Min assist         General transfer comment: assist to rise and steady  Ambulation/Gait Ambulation/Gait assistance: Min assist Gait Distance (Feet): 160 Feet Assistive device: Rolling walker (2 wheeled) Gait Pattern/deviations: Step-through pattern;Trunk flexed;Decreased stride length     General Gait Details: verbal cues for safe use of RW, posture; 3-4 instances of light steadying assist required   Stairs             Wheelchair Mobility    Modified Rankin (Stroke Patients Only)       Balance Overall balance assessment: Mild  deficits observed, not formally tested         Standing balance support: Bilateral upper extremity supported Standing balance-Leahy Scale: Poor Standing balance comment: reliant on UEs                             Cognition Arousal/Alertness: Awake/alert Behavior During Therapy: WFL for tasks assessed/performed Overall Cognitive Status: Within Functional Limits for tasks assessed                                        Exercises      General Comments        Pertinent Vitals/Pain Pain Assessment: Faces Faces Pain Scale: Hurts whole lot Pain Location: abdomen Pain Descriptors / Indicators: Grimacing;Guarding;Discomfort Pain Intervention(s): Monitored during session;Repositioned;Patient requesting pain meds-RN notified    Home Living                      Prior Function            PT Goals (current goals can now be found in the care plan section) Progress towards PT goals: Progressing toward goals    Frequency    Min 3X/week      PT Plan Current plan remains appropriate    Co-evaluation              AM-PAC PT "6 Clicks" Mobility   Outcome  Measure  Help needed turning from your back to your side while in a flat bed without using bedrails?: A Little Help needed moving from lying on your back to sitting on the side of a flat bed without using bedrails?: A Little Help needed moving to and from a bed to a chair (including a wheelchair)?: A Little Help needed standing up from a chair using your arms (e.g., wheelchair or bedside chair)?: A Little Help needed to walk in hospital room?: A Little Help needed climbing 3-5 steps with a railing? : A Little 6 Click Score: 18    End of Session   Activity Tolerance: Patient limited by pain Patient left: with call bell/phone within reach;in chair Nurse Communication: Mobility status PT Visit Diagnosis: Difficulty in walking, not elsewhere classified (R26.2)     Time:  1347-1410 PT Time Calculation (min) (ACUTE ONLY): 23 min  Charges:  $Gait Training: 8-22 mins          Carmelia Bake, PT, DPT Acute Rehabilitation Services Office: [phone] Pager: [phone]    Trena Platt 03/22/2019, 3:53 PM

## 2019-03-22 NOTE — Progress Notes (Signed)
Progress Note  Patient Name: Gary [name] Date of Encounter: 03/22/2019  Primary Cardiologist: Franchot Mimes, MD   Subjective   Taking PO no cardiac complaints Likely home in am  Inpatient Medications    Scheduled Meds: . acetaminophen  1,000 mg Oral Q8H  . atorvastatin  40 mg Oral q1800  . doxycycline  100 mg Oral Q12H  . levothyroxine  112 mcg Oral QAC breakfast  . mirtazapine  7.5 mg Oral QHS  . multivitamin with minerals  1 tablet Oral Daily  . omega-3 acid ethyl esters  1 g Oral Daily  . predniSONE  5 mg Oral QHS  . sertraline  100 mg Oral QHS   Continuous Infusions: . sodium chloride 100 mL/hr at 03/22/19 0532  . piperacillin-tazobactam (ZOSYN)  IV 3.375 g (03/22/19 0620)   PRN Meds: diazepam, meclizine, morphine injection, ondansetron **OR** ondansetron (ZOFRAN) IV, oxyCODONE   Vital Signs    Vitals:   03/21/19 0545 03/21/19 1304 03/21/19 2116 03/22/19 0622  BP: (!) 147/97 (!) 136/92 122/75 139/83  Pulse: 90 100 89 82  Resp: 14 18 18 18   Temp: 98.6 F (37 C) 98 F (36.7 C) 98.8 F (37.1 C) 98.5 F (36.9 C)  TempSrc: Oral Oral Oral Oral  SpO2: 98% 93% 97% 95%  Weight:      Height:        Intake/Output Summary (Last 24 hours) at 03/22/2019 0759 Last data filed at 03/22/2019 6834 Gross per 24 hour  Intake 3002.73 ml  Output 560 ml  Net 2442.73 ml   Last 3 Weights 03/19/2019 10/05/2017 09/19/2017  Weight (lbs) 170 lb 176 lb 176 lb  Weight (kg) 77.111 kg 79.833 kg 79.833 kg      Telemetry    ECG    No new tracings - Personally Reviewed  Physical Exam  Elderly white male Pale  GEN: No acute distress.   Neck: No JVD Cardiac: RRR, no murmurs, rubs, or gallops.  Respiratory: Clear to auscultation bilaterally. GI: post GB surgery BS positive MS: No edema; No deformity. Neuro:  Nonfocal  Psych: Normal affect   Labs    Chemistry Recent Labs  Lab 03/20/19 0519 03/21/19 0413 03/22/19 0408  NA 138 139 138  K 3.8 4.3 4.3  CL 102  102 105  CO2 26 26 26   GLUCOSE 130* 141* 121*  BUN 9 11 16   CREATININE 1.42* 1.30* 1.49*  CALCIUM 9.1 9.1 8.4*  PROT 6.6 6.5 5.5*  ALBUMIN 3.1* 3.0* 2.5*  AST 28 67* 33  ALT 17 28 20   ALKPHOS 75 68 54  BILITOT 0.8 0.6 0.8  GFRNONAA 47* 52* 44*  GFRAA 54* >60 51*  ANIONGAP 10 11 7      Hematology Recent Labs  Lab 03/20/19 0519 03/21/19 0413 03/22/19 0408  WBC 6.2 10.7* 6.7  RBC 3.77* 3.75* 2.93*  HGB 12.2* 11.9* 9.2*  HCT 38.1* 37.6* 29.8*  MCV 101.1* 100.3* 101.7*  MCH 32.4 31.7 31.4  MCHC 32.0 31.6 30.9  RDW 15.0 14.9 15.6*  PLT 223 240 199    Cardiac EnzymesNo results for input(s): TROPONINI in the last 168 hours. No results for input(s): TROPIPOC in the last 168 hours.   BNPNo results for input(s): BNP, PROBNP in the last 168 hours.   DDimer No results for input(s): DDIMER in the last 168 hours.   Radiology    No results found.  Cardiac Studies   LHC 10/11/16: The obtuse marginal balloon angioplasty site performed on 09/13/2016  is widely patent except at the ostium which shows a 30 at most 40% restenosis.  Successful PTCA and stenting of the mid LAD with implantation of a 2.5 x 12 mm Onyx DES.  Successful PTCA and bifurcation stenting of LAD-D1 with 3.0x2.5 [place] into the proximal LAD and D1 and native LAD in the proximal and midsegment was stented with a 3.0 x 15 mm Onyx DES. Distal LAD stenosis reduced from 80% to 0%, proximal to mid LAD stenosis reduced from 90% to 0%, D1 stenosis reduced from 80% to 0% with maintenance of TIMI-3 to TIMI-3 flow.  Patient received 30.6 minutes of fluoroscopy time, 0.6 in excess of safety, hence will be monitored for any radiation injury patient will also be educated about radiation injury. A total of 220 mL contrast utilized.  Recommendation: He will need aggressive risk factor modification. He'll be continued on Xarelto along with Plavix for atrial fibrillation and CAD/DES stenting. Careful watch on his bleeding  complications would performed. He'll be discharged home in the morning.  Patient Profile     78 y.o. male with history of CAD stent to LAD 2017 and POB to OM2. History of PAF and noraml EF post lap choly Day #2  Assessment & Plan    1. CAD - hx of stents x 4 (2017) - continue statin - ASA was held for surgery -resume when safe to do so - no anginal complaints   2. PAF - xarelto 10 mg BID held for surgery, and now with reduced hemoglobin (below) - resume when safe to do so per surgery - telemetry with HR in he 80s - propranolol on hold   3. Lap chole - per surgery   4. CAP -Nodular density on CT chest - Antibiotics Zosyn and doxycycline - per primary   5. Anemia - Hb 9.2, down from 11.9 yesterday - per surgery    For questions or updates, please contact [place] Please consult [email or web address] for contact info under       [employer]

## 2019-03-22 NOTE — Progress Notes (Signed)
2 Days Post-Op    CC: Nausea, vomiting and diarrhea, cough  Subjective: He denies having any pain this morning.  He has been able to get up out of bed.  Objective: Vital signs in last 24 hours: Temp:  [98 F (36.7 C)-98.8 F (37.1 C)] 98.5 F (36.9 C) (05/15 0622) Pulse Rate:  [82-100] 82 (05/15 0622) Resp:  [18] 18 (05/15 0622) BP: (122-139)/(75-92) 139/83 (05/15 0622) SpO2:  [93 %-97 %] 95 % (05/15 0622) Last BM Date: 03/20/19 240 Po 2900 IV 1550 urine 120 drain  Afebrile, BP up last PM, still on O2 Creatinine 1.3 Intake/Output from previous day: 05/14 0701 - 05/15 0700 In: 3002.7 [P.O.:1460; I.V.:1400; IV Piggyback:142.7] Out: 560 [Urine:500; Drains:60] Intake/Output this shift: No intake/output data recorded.  General appearance: alert, cooperative and no distress Resp: clear to auscultation bilaterally GI: soft, appropriately tender most at extraction site in the epigastrium, incisions are clean, dry intact without cellulitis, JP output is sanguino-serous  Lab Results:  Recent Labs    03/21/19 0413 03/22/19 0408  WBC 10.7* 6.7  HGB 11.9* 9.2*  HCT 37.6* 29.8*  PLT 240 199    BMET Recent Labs    03/21/19 0413 03/22/19 0408  NA 139 138  K 4.3 4.3  CL 102 105  CO2 26 26  GLUCOSE 141* 121*  BUN 11 16  CREATININE 1.30* 1.49*  CALCIUM 9.1 8.4*   PT/INR No results for input(s): LABPROT, INR in the last 72 hours.  Recent Labs  Lab 03/19/19 0043 03/20/19 0519 03/21/19 0413 03/22/19 0408  AST 39 28 67* 33  ALT 20 17 28 20   ALKPHOS 89 75 68 54  BILITOT 0.5 0.8 0.6 0.8  PROT 6.8 6.6 6.5 5.5*  ALBUMIN 3.3* 3.1* 3.0* 2.5*     Lipase     Component Value Date/Time   LIPASE 28 03/19/2019 0043     Prior to Admission medications   Medication Sig Start Date End Date Taking? Authorizing Provider  acyclovir (ZOVIRAX) 400 MG tablet Take 400 mg by mouth 2 (two) times daily.  03/01/13  Yes [provider]  aspirin EC 81 MG tablet Take 81 mg  by mouth daily.   Yes [provider]  atorvastatin (LIPITOR) 40 MG tablet Take 40 mg by mouth 2 (two) times daily.  09/09/16  Yes [provider]  Calcium Carbonate-Vitamin D (CALCIUM-D) 600-400 MG-UNIT TABS Take 1 tablet by mouth daily.    Yes [provider]  EPINEPHrine 0.3 mg/0.3 mL IJ SOAJ injection Inject 0.3 mg into the muscle as directed.  05/01/14  Yes [provider]  magnesium oxide (MAG-OX) 400 MG tablet Take 400 mg by mouth at bedtime.   Yes [provider]  meclizine (ANTIVERT) 25 MG tablet Take 25 mg by mouth every 12 (twelve) hours as needed for dizziness.    Yes [provider]  mirtazapine (REMERON) 15 MG tablet Take 7.5 mg by mouth at bedtime.    Yes [provider]  Multiple Vitamin (MULTIVITAMIN WITH MINERALS) TABS tablet Take 1 tablet by mouth daily.   Yes [provider]  omega-3 acid ethyl esters (LOVAZA) 1 G capsule Take 1 g by mouth daily.    Yes [provider]  oxyCODONE (ROXICODONE) 15 MG immediate release tablet Take 15 mg by mouth 4 (four) times daily as needed for pain.    Yes [provider]  pantoprazole (PROTONIX) 40 MG tablet Take 40 mg by mouth 2 (two) times daily. 01/14/19  Yes [provider]  Polyethyl Glycol-Propyl Glycol (SYSTANE) 0.4-0.3 % GEL ophthalmic gel Place 1 application into both eyes as needed (dry eyes).   Yes [provider]  predniSONE (DELTASONE) 5 MG tablet Take 5 mg by mouth at bedtime.    Yes [provider]  propranolol (INDERAL) 10 MG tablet Take 10 mg by mouth 2 (two) times daily.   Yes [provider]  sertraline (ZOLOFT) 100 MG tablet Take 100 mg by mouth at bedtime.  11/20/12  Yes [provider]  XARELTO 10 MG TABS tablet Take 10 mg by mouth daily. 01/03/19  Yes [provider]  clobetasol (TEMOVATE) 0.05 % external solution Apply 1 application topically as needed. 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 For anxiety Patient taking differently: Take 5 mg by mouth at bedtime as needed (sleep). For anxiety 12/25/12   Barton Dubois, MD  levothyroxine (SYNTHROID, LEVOTHROID) 112 MCG tablet Take 112 mcg daily before breakfast by mouth.    [provider]  lisinopril (ZESTRIL) 5 MG tablet Take 5 mg by mouth daily. 02/12/19   [provider]    Medications: . acetaminophen  1,000 mg Oral Q8H  . atorvastatin  40 mg Oral q1800  . doxycycline  100 mg Oral Q12H  . levothyroxine  112 mcg Oral QAC breakfast  . mirtazapine  7.5 mg Oral QHS  . multivitamin with minerals  1 tablet Oral Daily  . omega-3 acid ethyl esters  1 g Oral Daily  . predniSONE  5 mg Oral QHS  . sertraline  100 mg Oral QHS   . sodium chloride 100 mL/hr at 03/22/19 0532  . piperacillin-tazobactam (ZOSYN)  IV 3.375 g (03/22/19 4782)   Assessment/Plan Community acquired pneumonia/nondular density on CT  - negative COVID test PAF - on chronic anticoagulation(Xarelto) CAD/4 stents Rheumatoid arthritis - on chronic prednisone CKD - creatinine 1.49>>1.42>>1.30>>1.49 (5/15) Hypothyroid  ETOH use - 2 shots of [place] Mist each night to sleep x 5 Plus years Heavier ETOH use in the past  Acute on chronic cholecystitis with concrete adhesion of cystic duct to duodenal bulb Laparoscopic cholecystectomy, 03/20/19, Dr.Rishi Vicario Kae Heller   FEN:  IV fluids/ heart healthy diet ID: Doxycycline tabs, Zosyn 5/12 >>  DVT:  SCD's - off Xarelto Follow up:  [facility] POC: Mar[place],Rick Son [phone]  [phone]  [place], Nolden [phone]  [phone]    Plan: He is tolerating diet.  Continue working on mobilizing and pulmonary toilet.  His hemoglobin did drop somewhat today, but this appears to be dilutional based on the rest of his CBC, would recheck this tomorrow or at least prior to  discharge.  No clinical signs of bleeding, but would hold Xarelto 1 more day.        LOS: 3 days    Clovis Riley 03/22/2019 [phone]

## 2019-03-22 NOTE — Progress Notes (Signed)
[facility]  PROGRESS NOTE  Gary [name] HMC:[id number] DOB: May 28, 1941 DOA: 03/19/2019 PCP: Deland Pretty, MD   Brief HPI:   78 year old male with history of CAD, status post stent x4, proximal atrial fibrillation on Xarelto, remote arthritis on prednisone, hypertension, HCV, head and neck CA in remission, CKD stage III came to ED with complaints of nausea vomiting and diarrhea.  With diffuse abdominal pain.HIDA scan came back positive for acute cholecystitis   Subjective   Patient seen and examined, denies nausea vomiting.  Started on heart healthy diet.   Assessment/Plan:     1. Acute cholecystitis/status post cholecystectomy-HIDA scan was positive for acute cholecystitis.   General surgery following.  Patient tolerating heart healthy diet.  2. Community-acquired pneumonia-nodular density seen on the CT chest.  Started on Zosyn and doxycycline.  COVID-19 test was negative.  Will switch to p.o. Augmentin at the time of discharge.  He will need a repeat CT chest in 6  Months  to check for resolution of nodules.  3. Paroxysmal atrial fibrillation-continue metoprolol.  Xarelto is on hold.  Restart as per surgery.  4. CAD status post stents x 4-cardiology was consulted patient deemed moderate risk given history of CAD.  Xarelto was held.  Continued on aspirin and statin.  5. Rheumatoid arthritis-continue prednisone at home dose.  6. Pulmonary nodule-likely infectious, will need repeat CT in 6 months.  Patient has history of head and neck cancer.   CBC: Recent Labs  Lab 03/19/19 0043 03/20/19 0519 03/21/19 0413 03/22/19 0408  WBC 6.6 6.2 10.7* 6.7  NEUTROABS 4.8  --   --   --   HGB 12.1* 12.2* 11.9* 9.2*  HCT 38.2* 38.1* 37.6* 29.8*  MCV 100.5* 101.1* 100.3* 101.7*  PLT 226 223 240 662    Basic Metabolic Panel: Recent Labs  Lab 03/19/19 0043 03/20/19 0519 03/21/19 0413 03/22/19 0408  NA 138 138 139 138  K 3.7 3.8 4.3 4.3  CL 102 102 102 105  CO2 27 26 26 26    GLUCOSE 158* 130* 141* 121*  BUN 14 9 11 16   CREATININE 1.49* 1.42* 1.30* 1.49*  CALCIUM 9.1 9.1 9.1 8.4*     DVT prophylaxis: SCDs  Code Status: Full code  Family Communication: No family at bedside  Disposition Plan: likely home when medically ready for discharge     Consultants:  General surgery  Procedures:     Antibiotics:   Anti-infectives (From admission, onward)   Start     Dose/Rate Route Frequency Ordered Stop   03/19/19 2200  doxycycline (VIBRA-TABS) tablet 100 mg     100 mg Oral Every 12 hours 03/19/19 0554     03/19/19 1100  piperacillin-tazobactam (ZOSYN) IVPB 3.375 g     3.375 g 12.5 mL/hr over 240 Minutes Intravenous Every 8 hours 03/19/19 0603     03/19/19 0415  piperacillin-tazobactam (ZOSYN) IVPB 3.375 g     3.375 g 100 mL/hr over 30 Minutes Intravenous  Once 03/19/19 0403 03/19/19 0514   03/19/19 0415  doxycycline (VIBRAMYCIN) 100 mg in sodium chloride 0.9 % 250 mL IVPB     100 mg 125 mL/hr over 120 Minutes Intravenous  Once 03/19/19 0404 03/19/19 0728       Objective   Vitals:   03/21/19 1304 03/21/19 2116 03/22/19 0622 03/22/19 1329  BP: (!) 136/92 122/75 139/83 122/68  Pulse: 100 89 82 83  Resp: 18 18 18    Temp: 98 F (36.7 C) 98.8 F (37.1 C) 98.5 F (  36.9 C) 98.8 F (37.1 C)  TempSrc: Oral Oral Oral Oral  SpO2: 93% 97% 95% 95%  Weight:      Height:        Intake/Output Summary (Last 24 hours) at 03/22/2019 1935 Last data filed at 03/22/2019 7253 Gross per 24 hour  Intake 3970 ml  Output 552 ml  Net 3418 ml   Filed Weights   03/19/19 0024  Weight: 77.1 kg     Physical Examination:   General: Appears in no acute distress  Cardiovascular: S1-S2, regular, no murmur auscultated  Respiratory: Clear to auscultation bilaterally  Abdomen: Soft, mild tenderness in right upper quadrant, JP drain in place  Extremities: No edema of the lower extremities  Neurologic: Alert, oriented x3      Data Reviewed: I  have personally reviewed following labs and imaging studies   Recent Results (from the past 240 hour(s))  SARS Coronavirus 2 (CEPHEID - Performed in [place] hospital lab), Hosp Order     Status: None   Collection Time: 03/19/19 12:44 AM  Result Value Ref Range Status   SARS Coronavirus 2 NEGATIVE NEGATIVE Final    Comment: (NOTE) If result is NEGATIVE SARS-CoV-2 target nucleic acids are NOT DETECTED. The SARS-CoV-2 RNA is generally detectable in upper and lower  respiratory specimens during the acute phase of infection. The lowest  concentration of SARS-CoV-2 viral copies this assay can detect is 250  copies / mL. A negative result does not preclude SARS-CoV-2 infection  and should not be used as the sole basis for treatment or other  patient management decisions.  A negative result may occur with  improper specimen collection / handling, submission of specimen other  than nasopharyngeal swab, presence of viral mutation(s) within the  areas targeted by this assay, and inadequate number of viral copies  (<250 copies / mL). A negative result must be combined with clinical  observations, patient history, and epidemiological information. If result is POSITIVE SARS-CoV-2 target nucleic acids are DETECTED. The SARS-CoV-2 RNA is generally detectable in upper and lower  respiratory specimens dur ing the acute phase of infection.  Positive  results are indicative of active infection with SARS-CoV-2.  Clinical  correlation with patient history and other diagnostic information is  necessary to determine patient infection status.  Positive results do  not rule out bacterial infection or co-infection with other viruses. If result is PRESUMPTIVE POSTIVE SARS-CoV-2 nucleic acids MAY BE PRESENT.   A presumptive positive result was obtained on the submitted specimen  and confirmed on repeat testing.  While 2019 novel coronavirus  (SARS-CoV-2) nucleic acids may be present in the submitted sample   additional confirmatory testing may be necessary for epidemiological  and / or clinical management purposes  to differentiate between  SARS-CoV-2 and other Sarbecovirus currently known to infect humans.  If clinically indicated additional testing with an alternate test  methodology [phone]) is advised. The SARS-CoV-2 RNA is generally  detectable in upper and lower respiratory sp ecimens during the acute  phase of infection. The expected result is Negative. Fact Sheet for Patients:  StrictlyIdeas.no Fact Sheet for Healthcare Providers: BankingDealers.co.za This test is not yet approved or cleared by the [place] FDA and has been authorized for detection and/or diagnosis of SARS-CoV-2 by FDA under an Emergency Use Authorization (EUA).  This EUA will remain in effect (meaning this test can be used) for the duration of the COVID-19 declaration under Section 564(b)(1) of the Act, 21 U.S.C. section 360bbb-3(b)(1), unless the  authorization is terminated or revoked sooner. Performed at [facility], [place] [address] [place], [place] [address]   Blood culture (routine x 2)     Status: None (Preliminary result)   Collection Time: 03/19/19  4:29 AM  Result Value Ref Range Status   Specimen Description   Final    BLOOD RIGHT ARM Performed at [facility] [address] [place], [place] [address]    Special Requests   Final    BOTTLES DRAWN AEROBIC AND ANAEROBIC Blood Culture adequate volume Performed at [facility] [address] [place], [place] [address]    Culture   Final    NO GROWTH 3 DAYS Performed at [facility], [place] [address] [place], [place] [address]    Report Status PENDING  Incomplete  Blood culture (routine x 2)     Status: None (Preliminary result)   Collection Time: 03/19/19  4:37 AM  Result Value Ref Range Status   Specimen Description   Final     BLOOD RIGHT ANTECUBITAL Performed at [facility] [address], [place], [place] [address]    Special Requests   Final    BOTTLES DRAWN AEROBIC AND ANAEROBIC Blood Culture results may not be optimal due to an excessive volume of blood received in culture bottles Performed at [facility] [address], [place], [place] [address]    Culture   Final    NO GROWTH 3 DAYS Performed at [facility], [place] [address] [place], [place] [address]    Report Status PENDING  Incomplete  Surgical pcr screen     Status: None   Collection Time: 03/20/19  4:25 AM  Result Value Ref Range Status   MRSA, PCR NEGATIVE NEGATIVE Final   Staphylococcus aureus NEGATIVE NEGATIVE Final    Comment: (NOTE) The Xpert SA Assay (FDA approved for NASAL specimens in patients 66 years of age and older), is one component of a comprehensive surveillance program. It is not intended to diagnose infection nor to guide or monitor treatment. Performed at [facility], [place] [address] [place],  [address]      Liver Function Tests: Recent Labs  Lab 03/19/19 0043 03/20/19 0519 03/21/19 0413 03/22/19 0408  AST 39 28 67* 33  ALT 20 17 28 20   ALKPHOS 89 75 68 54  BILITOT 0.5 0.8 0.6 0.8  PROT 6.8 6.6 6.5 5.5*  ALBUMIN 3.3* 3.1* 3.0* 2.5*   Recent Labs  Lab 03/19/19 0043  LIPASE 28   No results for input(s): AMMONIA in the last 168 hours.  Cardiac Enzymes: No results for input(s): CKTOTAL, CKMB, CKMBINDEX, TROPONINI in the last 168 hours. BNP (last 3 results) No results for input(s): BNP in the last 8760 hours.  ProBNP (last 3 results) No results for input(s): PROBNP in the last 8760 hours.    Studies: No results found.  Scheduled Meds: . acetaminophen  1,000 mg Oral Q8H  . atorvastatin  40 mg Oral q1800  . doxycycline  100 mg Oral Q12H  . levothyroxine  112 mcg Oral QAC breakfast  . mirtazapine  7.5 mg Oral QHS  .  multivitamin with minerals  1 tablet Oral Daily  . omega-3 acid ethyl esters  1 g Oral Daily  . predniSONE  5 mg Oral QHS  . sertraline  100 mg Oral QHS    Admission status: Inpatient: Based on patients clinical presentation and evaluation of above  clinical data, I have made determination that patient meets Inpatient criteria at this time.  Time spent: 20 min  [facility] Pager [phone]. If 7PM-7AM, please contact night-coverage at [email or web address], Office  [phone]  password [email or web address]  03/22/2019, 7:35 PM  LOS: 3 days

## 2019-03-23 LAB — CBC
HCT: 34.1 % — ABNORMAL LOW (ref 39.0–52.0)
Hemoglobin: 10.5 g/dL — ABNORMAL LOW (ref 13.0–17.0)
MCH: 31.7 pg (ref 26.0–34.0)
MCHC: 30.8 g/dL (ref 30.0–36.0)
MCV: 103 fL — ABNORMAL HIGH (ref 80.0–100.0)
Platelets: 295 10*3/uL (ref 150–400)
RBC: 3.31 MIL/uL — ABNORMAL LOW (ref 4.22–5.81)
RDW: 15.6 % — ABNORMAL HIGH (ref 11.5–15.5)
WBC: 8.5 10*3/uL (ref 4.0–10.5)
nRBC: 0 % (ref 0.0–0.2)

## 2019-03-23 MED ORDER — AMOXICILLIN-POT CLAVULANATE 875-125 MG PO TABS: 1.0000 | ORAL_TABLET | Freq: Two times a day (BID) | ORAL | 0 refills | Status: AC

## 2019-03-23 MED ORDER — OXYCODONE HCL 15 MG PO TABS: 15.0000 mg | ORAL_TABLET | Freq: Four times a day (QID) | ORAL | 0 refills | Status: AC | PRN

## 2019-03-23 MED ORDER — OXYCODONE HCL 15 MG PO TABS: 15.0000 mg | ORAL_TABLET | Freq: Four times a day (QID) | ORAL | 0 refills | Status: DC | PRN

## 2019-03-23 NOTE — Progress Notes (Signed)
Progress Note  Patient Name: Gary [name] Date of Encounter: 03/23/2019  Primary Cardiologist: Franchot Mimes, MD   Subjective   Home today no cardiac complaints   Inpatient Medications    Scheduled Meds: . acetaminophen  1,000 mg Oral Q8H  . atorvastatin  40 mg Oral q1800  . doxycycline  100 mg Oral Q12H  . levothyroxine  112 mcg Oral QAC breakfast  . mirtazapine  7.5 mg Oral QHS  . multivitamin with minerals  1 tablet Oral Daily  . omega-3 acid ethyl esters  1 g Oral Daily  . predniSONE  5 mg Oral QHS  . sertraline  100 mg Oral QHS   Continuous Infusions: . sodium chloride 100 mL/hr at 03/22/19 2204  . piperacillin-tazobactam (ZOSYN)  IV 3.375 g (03/23/19 0602)   PRN Meds: diazepam, meclizine, morphine injection, ondansetron **OR** ondansetron (ZOFRAN) IV, oxyCODONE   Vital Signs    Vitals:   03/22/19 0622 03/22/19 1329 03/22/19 2136 03/23/19 0554  BP: 139/83 122/68 127/80 134/81  Pulse: 82 83 77 75  Resp: 18  16 16   Temp: 98.5 F (36.9 C) 98.8 F (37.1 C) 99.2 F (37.3 C) 99.1 F (37.3 C)  TempSrc: Oral Oral Oral Oral  SpO2: 95% 95% 92% 92%  Weight:      Height:        Intake/Output Summary (Last 24 hours) at 03/23/2019 0801 Last data filed at 03/23/2019 0600 Gross per 24 hour  Intake 2950 ml  Output 592 ml  Net 2358 ml   Last 3 Weights 03/19/2019 10/05/2017 09/19/2017  Weight (lbs) 170 lb 176 lb 176 lb  Weight (kg) 77.111 kg 79.833 kg 79.833 kg      Telemetry    ECG    No new tracings - Personally Reviewed  Physical Exam  Elderly white male Pale  GEN: No acute distress.   Neck: No JVD Cardiac: RRR, no murmurs, rubs, or gallops.  Respiratory: Clear to auscultation bilaterally. GI: post GB surgery BS positive MS: No edema; No deformity. Neuro:  Nonfocal  Psych: Normal affect   Labs    Chemistry Recent Labs  Lab 03/20/19 0519 03/21/19 0413 03/22/19 0408  NA 138 139 138  K 3.8 4.3 4.3  CL 102 102 105  CO2 26 26 26    GLUCOSE 130* 141* 121*  BUN 9 11 16   CREATININE 1.42* 1.30* 1.49*  CALCIUM 9.1 9.1 8.4*  PROT 6.6 6.5 5.5*  ALBUMIN 3.1* 3.0* 2.5*  AST 28 67* 33  ALT 17 28 20   ALKPHOS 75 68 54  BILITOT 0.8 0.6 0.8  GFRNONAA 47* 52* 44*  GFRAA 54* >60 51*  ANIONGAP 10 11 7      Hematology Recent Labs  Lab 03/20/19 0519 03/21/19 0413 03/22/19 0408  WBC 6.2 10.7* 6.7  RBC 3.77* 3.75* 2.93*  HGB 12.2* 11.9* 9.2*  HCT 38.1* 37.6* 29.8*  MCV 101.1* 100.3* 101.7*  MCH 32.4 31.7 31.4  MCHC 32.0 31.6 30.9  RDW 15.0 14.9 15.6*  PLT 223 240 199    Cardiac EnzymesNo results for input(s): TROPONINI in the last 168 hours. No results for input(s): TROPIPOC in the last 168 hours.   BNPNo results for input(s): BNP, PROBNP in the last 168 hours.   DDimer No results for input(s): DDIMER in the last 168 hours.   Radiology    No results found.  Cardiac Studies   LHC 10/11/16: The obtuse marginal balloon angioplasty site performed on 09/13/2016 is widely patent except at  the ostium which shows a 30 at most 40% restenosis.  Successful PTCA and stenting of the mid LAD with implantation of a 2.5 x 12 mm Onyx DES.  Successful PTCA and bifurcation stenting of LAD-D1 with 3.0x2.5 [place] into the proximal LAD and D1 and native LAD in the proximal and midsegment was stented with a 3.0 x 15 mm Onyx DES. Distal LAD stenosis reduced from 80% to 0%, proximal to mid LAD stenosis reduced from 90% to 0%, D1 stenosis reduced from 80% to 0% with maintenance of TIMI-3 to TIMI-3 flow.  Patient received 30.6 minutes of fluoroscopy time, 0.6 in excess of safety, hence will be monitored for any radiation injury patient will also be educated about radiation injury. A total of 220 mL contrast utilized.  Recommendation: He will need aggressive risk factor modification. He'll be continued on Xarelto along with Plavix for atrial fibrillation and CAD/DES stenting. Careful watch on his bleeding complications would performed.  He'll be discharged home in the morning.  Patient Profile     78 y.o. male with history of CAD stent to LAD 2017 and POB to OM2. History of PAF and noraml EF post lap choly Day #2  Assessment & Plan    1. CAD - hx of stents x 4 (2017) - continue statin - ASA was held for surgery -resume when safe to do so - no anginal complaints   2. PAF - xarelto 10 mg held for surgery, and now with reduced hemoglobin (below) - resume when safe to do so per surgery - telemetry with HR in he 80s - propranolol on hold  Should be d/c home with his xarelto and close f/u of his Hct   3. Lap chole - per surgery   4. CAP -Nodular density on CT chest - Antibiotics Zosyn and doxycycline - per primary   5. Anemia - Hct 29.8 significant decline since 5/14 per primary service needs to be followed closely with need for anticoagulation    For questions or updates, please contact [place] Please consult [email or web address] for contact info under       [employer]

## 2019-03-23 NOTE — Progress Notes (Signed)
Gave pt his medication that was picked up from the pharmacy by the social worker.

## 2019-03-23 NOTE — Plan of Care (Signed)
Patient sitting up in chair this morning; pain well controlled. No concerns or complaints noted at this time. Will continue to monitor.

## 2019-03-23 NOTE — Progress Notes (Signed)
     Home health agencies that serve [phone]. Your favorite home health agencies  [place] of Patient Care Rating Patient [place]  [phone] 3 out of 5 stars 4 out of [place]  [phone] 4  out of 5 stars 3 out of [place]  [phone] 4 out of 5 stars 4 out of [place]  [phone] 4  out of 5 stars 4 out of [place]  [phone] 4 out of 5 stars 4 out of [place]  [phone] 4 out of 5 stars 4 out of 5 stars  [facility] [place]  [phone] 3  out of 5 stars 4 out of [place]  [phone] 3 out of 5 stars 4 out of 5 stars  [facility]  [phone] 4 out of 5 stars Not Available  [facility]  [phone] 3  out of 5 stars 3 out of [place]  [phone] 3  out of 5 stars 4 out of [place]  [phone] 3  out of 5 stars 3 out of [place]  [phone] 3  out of 5 stars Not Available  [place]  [phone] 4  out of 5 stars 3 out of [place] number Footnote as displayed on [place]  1 This agency provides services under a federal waiver program to non-traditional, chronic long term population.  2 This agency provides services to a special needs population.  3 Not Available.  4 The number of patient episodes for this measure is too small to report.  5 This measure currently does not have data or provider has been certified/recertified for less than 6 months.  6 The national average for this measure is not provided because of state-to-state differences in data collection.  7 Medicare  is not displaying rates for this measure for any home health agency, because of an issue with the data.  8 There were problems with the data and they are being corrected.  9 Zero, or very few, patients met the survey's rules for inclusion. The scores shown, if any, reflect a very small number of surveys and may not accurately tell how an agency is doing.  10 Survey results are based on less than 12 months of data.  11 Fewer than 70 patients completed the survey. Use the scores shown, if any, with caution as the number of surveys may be too low to accurately tell how an agency is doing.  12 No survey results are available for this period.  13 Data suppressed by CMS for one or more quarters.

## 2019-03-23 NOTE — Progress Notes (Signed)
3 Days Post-Op   Subjective/Chief Complaint: No complaints   Objective: Vital signs in last 24 hours: Temp:  [98.8 F (37.1 C)-99.2 F (37.3 C)] 99.1 F (37.3 C) (05/16 0554) Pulse Rate:  [75-83] 75 (05/16 0554) Resp:  [16] 16 (05/16 0554) BP: (122-134)/(68-81) 134/81 (05/16 0554) SpO2:  [92 %-95 %] 92 % (05/16 0554) Last BM Date: 03/22/19  Intake/Output from previous day: 05/15 0701 - 05/16 0700 In: 2950 [P.O.:850; I.V.:2000; IV Piggyback:100] Out: 592 [Urine:500; Drains:92] Intake/Output this shift: No intake/output data recorded.  Exam: Awake and alert Looks comfortable Abdomen soft, non-tender, drain serosang  Lab Results:  Recent Labs    03/21/19 0413 03/22/19 0408  WBC 10.7* 6.7  HGB 11.9* 9.2*  HCT 37.6* 29.8*  PLT 240 199   BMET Recent Labs    03/21/19 0413 03/22/19 0408  NA 139 138  K 4.3 4.3  CL 102 105  CO2 26 26  GLUCOSE 141* 121*  BUN 11 16  CREATININE 1.30* 1.49*  CALCIUM 9.1 8.4*   PT/INR No results for input(s): LABPROT, INR in the last 72 hours. ABG No results for input(s): PHART, HCO3 in the last 72 hours.  Invalid input(s): PCO2, PO2  Studies/Results: No results found.  Anti-infectives: Anti-infectives (From admission, onward)   Start     Dose/Rate Route Frequency Ordered Stop   03/19/19 2200  doxycycline (VIBRA-TABS) tablet 100 mg     100 mg Oral Every 12 hours 03/19/19 0554     03/19/19 1100  piperacillin-tazobactam (ZOSYN) IVPB 3.375 g     3.375 g 12.5 mL/hr over 240 Minutes Intravenous Every 8 hours 03/19/19 0603     03/19/19 0415  piperacillin-tazobactam (ZOSYN) IVPB 3.375 g     3.375 g 100 mL/hr over 30 Minutes Intravenous  Once 03/19/19 0403 03/19/19 0514   03/19/19 0415  doxycycline (VIBRAMYCIN) 100 mg in sodium chloride 0.9 % 250 mL IVPB     100 mg 125 mL/hr over 120 Minutes Intravenous  Once 03/19/19 0404 03/19/19 0728      Assessment/Plan: s/p Procedure(s): LAPAROSCOPIC CHOLECYSTECTOMY (N/A)  Given low  Hgb yesterday, will repeat cbc this morning.  If ok, can start anticoag meds and possible discharge home.  LOS: 4 days    Coralie Keens 03/23/2019

## 2019-03-23 NOTE — Discharge Summary (Signed)
Physician Discharge Summary  KEY CEN CVE:[id number] DOB: 06/09/1941 DOA: 03/19/2019  PCP: Deland Pretty, MD  Admit date: 03/19/2019 Discharge date: 03/23/2019  Time spent: 50 minutes  Recommendations for Outpatient Follow-up:  1. Check CBC in 2 weeks. 2. Check CT chest in 3 months to check for resolution of pneumonia and also patient has lung nodules. 3. Patient to go with home health PT, social work, home health aide, RN.   Discharge Diagnoses:  Principal Problem:   Acute cholecystitis Active Problems:   HTN (hypertension)   RA (rheumatoid arthritis) ([place])   CAD, LAD stent 2002   PAF, new Feb 2014 with SSS component   Chronic anticoagulation, new Feb 2014- Xarelto   Pulmonary nodule   CAP (community acquired pneumonia)   Discharge Condition: Stable  Diet recommendation: Heart healthy diet  Filed Weights   03/19/19 0024  Weight: 77.1 kg    History of present illness:  78 year old male with history of CAD, status post stent x4, proximal atrial fibrillation on Xarelto, remote arthritis on prednisone, hypertension, HCV, head and neck CA in remission, CKD stage III came to ED with complaints of nausea vomiting and diarrhea.  With diffuse abdominal pain.HIDA scan came back positive for acute cholecystitis  Hospital Course:   1. Acute cholecystitis/status post cholecystectomy-HIDA scan was positive for acute cholecystitis.   General surgery following.  Patient tolerating heart healthy diet.  2. Community-acquired pneumonia-nodular density seen on the CT chest.  Started on Zosyn and doxycycline.  COVID-19 test was negative.  Will switch to p.o. Augmentin for 3 more days. He will need a repeat CT chest in 3  Months  to check for resolution of pneumonia and also patient has lung nodules.  3. Paroxysmal atrial fibrillation-continue metoprolol.  Continue Xarelto.  4. CAD status post stents x 4-cardiology was consulted patient deemed moderate risk given history of CAD.   Xarelto was held.  Continued on aspirin and statin.  5. Rheumatoid arthritis-continue prednisone at home dose.  6. Pulmonary nodule-likely infectious, will need repeat CT in3 months.  Patient has history of head and neck cancer.   Procedures:    Consultations:    Discharge Exam: Vitals:   03/22/19 2136 03/23/19 0554  BP: 127/80 134/81  Pulse: 77 75  Resp: 16 16  Temp: 99.2 F (37.3 C) 99.1 F (37.3 C)  SpO2: 92% 92%    General: Appears in no acute distress Cardiovascular: S1-S2, regular Respiratory: Clear to auscultation bilaterally Abdomen tenderness in right upper quadrant, status post cholecystectomy.  Discharge Instructions   Discharge Instructions    Diet - low sodium heart healthy   Complete by:  As directed    Increase activity slowly   Complete by:  As directed      Allergies as of 03/23/2019      Reactions   Bee Venom Swelling   Cellcept [mycophenolate Mofetil] Other (See Comments)   Reaction unknown   Colcrys [colchicine] Other (See Comments)   myalgias   Erythromycin Other (See Comments)   Reaction unknown   Methotrexate Derivatives Hives, Itching, Rash   Only in tablet form   Neosporin [neomycin-bacitracin Zn-polymyx] Rash   Trazodone Palpitations   Weakness, loss of appetite   Ultram [tramadol] Rash      Medication List    TAKE these medications   acyclovir 400 MG tablet Commonly known as:  ZOVIRAX Take 400 mg by mouth 2 (two) times daily.   amoxicillin-clavulanate 875-125 MG tablet Commonly known as:  Augmentin Take 1 tablet  by mouth 2 (two) times daily for 3 days.   aspirin EC 81 MG tablet Take 81 mg by mouth daily.   atorvastatin 40 MG tablet Commonly known as:  LIPITOR Take 40 mg by mouth 2 (two) times daily.   Calcium-D 600-400 MG-UNIT Tabs Take 1 tablet by mouth daily.   clobetasol 0.05 % external solution Commonly known as:  TEMOVATE Apply 1 application topically as needed. psoriasis on head   diazepam 5 MG  tablet Commonly known as:  VALIUM Take 1 tablet (5 mg total) by mouth every 12 (twelve) hours as needed for anxiety. For anxiety What changed:    when to take this  reasons to take this   EPINEPHrine 0.3 mg/0.3 mL Soaj injection Commonly known as:  EPI-PEN Inject 0.3 mg into the muscle as directed.   levothyroxine 112 MCG tablet Commonly known as:  SYNTHROID Take 112 mcg daily before breakfast by mouth.   lisinopril 5 MG tablet Commonly known as:  ZESTRIL Take 5 mg by mouth daily.   magnesium oxide 400 MG tablet Commonly known as:  MAG-OX Take 400 mg by mouth at bedtime.   meclizine 25 MG tablet Commonly known as:  ANTIVERT Take 25 mg by mouth every 12 (twelve) hours as needed for dizziness.   mirtazapine 15 MG tablet Commonly known as:  REMERON Take 7.5 mg by mouth at bedtime.   multivitamin with minerals Tabs tablet Take 1 tablet by mouth daily.   omega-3 acid ethyl esters 1 g capsule Commonly known as:  LOVAZA Take 1 g by mouth daily.   oxyCODONE 15 MG immediate release tablet Commonly known as:  ROXICODONE Take 1 tablet (15 mg total) by mouth every 6 (six) hours as needed for up to 5 days for pain. What changed:  when to take this   pantoprazole 40 MG tablet Commonly known as:  PROTONIX Take 40 mg by mouth 2 (two) times daily.   predniSONE 5 MG tablet Commonly known as:  DELTASONE Take 5 mg by mouth at bedtime.   propranolol 10 MG tablet Commonly known as:  INDERAL Take 10 mg by mouth 2 (two) times daily.   sertraline 100 MG tablet Commonly known as:  ZOLOFT Take 100 mg by mouth at bedtime.   Systane 0.4-0.3 % Gel ophthalmic gel Generic drug:  Polyethyl Glycol-Propyl Glycol Place 1 application into both eyes as needed (dry eyes).   Xarelto 10 MG Tabs tablet Generic drug:  rivaroxaban Take 10 mg by mouth daily.            Durable Medical Equipment  (From admission, onward)         Start     Ordered   03/23/19 1131  For home use only  DME Walker rolling  Once    Comments:  Cholecystectomy  Question:  Patient needs a walker to treat with the following condition  Answer:  Pneumonia   03/23/19 1130         Allergies  Allergen Reactions  . Bee Venom Swelling  . Cellcept [Mycophenolate Mofetil] Other (See Comments)    Reaction unknown  . Colcrys [Colchicine] Other (See Comments)    myalgias  . Erythromycin Other (See Comments)    Reaction unknown  . Methotrexate Derivatives Hives, Itching and Rash    Only in tablet form  . Neosporin [Neomycin-Bacitracin Zn-Polymyx] Rash  . Trazodone Palpitations    Weakness, loss of appetite  . Ultram [Tramadol] Rash   Follow-up Information    [facility] up on 04/02/2019.   Specialty:  General Surgery Why:  our follow up will be a phone call appointment  (due to the [facility] virus, to limit exposure.) Carlena Hurl will call you at 11:15AM.  Please email a picture if you have an concerns with you wound to : [email or web address]  Contact information: [place] [place] [address] [phone]        Deland Pretty, MD Follow up.   Specialty:  Internal Medicine Why:  follow up for medical issues;  let them know you had surgery Contact information: [address] [place] [address] [phone]        Franchot Mimes, MD .   Specialty:  Family Medicine Contact information: [address] [facility] [place] [address] [facility] [place]. Go on 03/28/2019.   Specialty:  General Surgery Why:  Nurse visit for drain check and possible drain removal scheduled for 2:00 PM. Please arrive 30 min prior to appointment time. Bring photo ID and insurance information.  Contact information: [address] [place] [phone]           The results of significant diagnostics from this hospitalization (including  imaging, microbiology, ancillary and laboratory) are listed below for reference.    Significant Diagnostic Studies: Nm Hepato W/eject Fract  Result Date: 03/19/2019 CLINICAL DATA:  RIGHT upper quadrant pain. Cholelithiasis on ultrasound. Pericholecystic fluid on CT examination. EXAM: NUCLEAR MEDICINE HEPATOBILIARY IMAGING TECHNIQUE: Sequential images of the abdomen were obtained out to 60 minutes following intravenous administration of radiopharmaceutical. 3 mg IV morphine RADIOPHARMACEUTICALS:  5.5 mCi Tc-23m Choletec IV (booster dose of 1.1 millicurie Choletec administered after 90 minutes) COMPARISON:  CT 03/19/2019, ultrasound 03/19/2019 FINDINGS: There is prompt clearance radiotracer from the blood pool and homogeneous uptake in the liver. Counts are evident in the common bile duct and small bowel by 30 minutes. The gallbladder is not visualized at 90 minutes. At 90 minutes, IV morphine was administered to contract the sphincter of Oddi. 30 minutes of imaging following IV administration of morphine fail to demonstrate filling of the gallbladder. IMPRESSION: Non filling of the gallbladder indicates obstruction of the cystic duct. Findings consistent with ACUTE CHOLECYSTITIS. These results will be called to the ordering clinician or representative by the Radiologist Assistant, and communication documented in the PACS or zVision Dashboard. Electronically Signed   By: Suzy Bouchard M.D.   On: 03/19/2019 15:11   Dg Chest Port 1 View  Result Date: 03/19/2019 CLINICAL DATA:  Nausea and vomiting with chest pain EXAM: PORTABLE CHEST 1 VIEW COMPARISON:  Chest CT 04/21/2015 FINDINGS: The heart size and mediastinal contours are within normal limits. Both lungs are clear. The visualized skeletal structures are unremarkable. IMPRESSION: No active disease. Electronically Signed   By: Ulyses Jarred M.D.   On: 03/19/2019 01:17   Ct Angio Chest/abd/pel For Dissection W And/or Wo Contrast  Result Date:  03/19/2019 CLINICAL DATA:  78 year old male with abdominal pain and vomiting. Concern for aortic dissection. History of squamous cell carcinoma of the head and neck. EXAM: CT ANGIOGRAPHY CHEST, ABDOMEN AND PELVIS TECHNIQUE: Multidetector CT imaging through the chest, abdomen and pelvis was performed using the standard protocol during bolus administration of intravenous contrast. Multiplanar reconstructed images and MIPs were obtained and reviewed to evaluate the vascular anatomy. CONTRAST:  32mL OMNIPAQUE IOHEXOL 350 MG/ML SOLN COMPARISON:  Chest radiograph dated  03/19/2019 and CT dated 03/21/2015 FINDINGS: CTA CHEST FINDINGS Cardiovascular: There is no cardiomegaly or pericardial effusion. Advanced coronary vascular calcification of the LAD. Minimal atherosclerotic calcification of the thoracic aorta. No aneurysmal dilatation or dissection. The origins of the great vessels of the aortic arch appear patent. No pulmonary artery embolus identified. Mediastinum/Nodes: Right hilar calcified lymph nodes. No mediastinal adenopathy. There is an area of dilatation of the upper esophagus which may represent a diverticulum. No mediastinal fluid collection. Lungs/Pleura: There is a cluster of nodular density in the right upper lobe concerning for an infectious process such as atypical infection. There is an 8 mm nodular density in the right lower lobe (series 9 image 99) which may represent inflammatory or infectious in etiology although metastatic disease is not entirely excluded. Clinical correlation and follow-up recommended. There are bibasilar dependent atelectatic changes and scarring. No lobar consolidation, pleural effusion, or pneumothorax. The central airways are patent. Musculoskeletal: There is osteopenia with degenerative changes of the spine. No acute osseous pathology. Old right posterior 12 rib fracture. Review of the MIP images confirms the above findings. CTA ABDOMEN AND PELVIS FINDINGS VASCULAR Aorta: Mild  atherosclerotic disease. No aneurysmal dilatation or dissection. The aorta is mildly tortuous. Celiac: Patent without evidence of aneurysm, dissection, vasculitis or significant stenosis. SMA: Atherosclerotic calcification. The SMA is patent. Renals: There is atherosclerotic calcification of the renal ostia. The renal arteries remain patent. IMA: Patent without evidence of aneurysm, dissection, vasculitis or significant stenosis. 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 The iliac arteries are otherwise patent. Veins: No obvious venous abnormality within the limitations of this arterial phase study. No portal venous gas. Review of the MIP images confirms the above findings. NON-VASCULAR No intra-abdominal free air or free fluid. Hepatobiliary: Apparent fatty infiltration of the liver. No intrahepatic biliary ductal dilatation. There is a calcified stone within the gallbladder. The gallbladder is distended. A stone is noted at the gallbladder neck. There is inflammatory changes and haziness of the gallbladder wall. Findings most consistent with an acute cholecystitis. Further evaluation with ultrasound recommended. Pancreas: Unremarkable. No pancreatic ductal dilatation or surrounding inflammatory changes. Spleen: Normal in size without focal abnormality. Adrenals/Urinary Tract: The adrenal glands are unremarkable. Mild bilateral renal parenchyma atrophy with areas of cortical scarring in the inferior pole of the right kidney. There is no hydronephrosis on either side. The visualized ureters and urinary bladder appear unremarkable. Stomach/Bowel: There is sigmoid diverticulosis without active inflammatory changes. No bowel obstruction or active inflammation. Normal appendix. Lymphatic: No adenopathy. Reproductive: Dystrophic calcification of the prostate gland. The seminal vesicles are symmetric. Other: None Musculoskeletal: Osteopenia. No  acute osseous pathology. Left femoral neck fixation screws. Review of the MIP images confirms the above findings. IMPRESSION: 1. No aortic aneurysm or dissection. No pulmonary artery embolus identified. 2. Cholelithiasis with findings of acute cholecystitis. Further evaluation with ultrasound recommended. 3. Cluster of nodular densities in the right upper lobe concerning for an infectious process such as atypical pneumonia. Clinical correlation and follow-up recommended. 4. An 8 mm right lower lobe nodular density is also likely infectious in etiology. Non-contrast chest CT at 6-12 months is recommended. If the nodule is stable at time of repeat CT, then future CT at 18-24 months (from today's scan) is considered optional for low-risk patients, but is recommended for high-risk patients. This recommendation follows the consensus statement: Guidelines for Management of Incidental Pulmonary Nodules Detected on CT Images: From the Fleischner Society 2017;  Radiology 2017; 284:228-243. 5. 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 6. Sigmoid diverticulosis. No bowel obstruction or active inflammation. Normal appendix. Electronically Signed   By: Anner Crete M.D.   On: 03/19/2019 03:54   US Abdomen Limited Ruq  Result Date: 03/19/2019 CLINICAL DATA:  78 year old male with right upper quadrant pain and suspicion of cholelithiasis and cholecystitis on CTA Chest, Abdomen, and Pelvis earlier today EXAM: ULTRASOUND ABDOMEN LIMITED RIGHT UPPER QUADRANT COMPARISON:  CTA Chest, Abdomen, and Pelvis earlier today. FINDINGS: Gallbladder: On some images the gallbladder wall appears normal, but on image 6 there is thickening at the fundus of 5-6 millimeters. Echogenic partially shadowing cholelithiasis identified (image 11). There is also dependent sludge within the gallbladder. Gallstones size estimated at 8 millimeters. No sonographic Murphy sign elicited. Common bile duct: Diameter: 5  millimeters, normal. Liver: Echogenic liver (image 22). No discrete liver lesion. No intrahepatic biliary ductal dilatation. Portal vein is patent on color Doppler imaging with normal direction of blood flow towards the liver. Other findings: Negative visible right kidney. IMPRESSION: 1. Focal gallbladder wall thickening with sludge and gallstones. Together with the earlier CTA appearance this is suspicious for Acute Cholecystitis. 2. CBD remains within normal limits.  Fatty liver disease. Electronically Signed   By: Genevie Ann M.D.   On: 03/19/2019 05:12    Microbiology: Recent Results (from the past 240 hour(s))  SARS Coronavirus 2 (CEPHEID - Performed in [facility] hospital lab), Hosp Order     Status: None   Collection Time: 03/19/19 12:44 AM  Result Value Ref Range Status   SARS Coronavirus 2 NEGATIVE NEGATIVE Final    Comment: (NOTE) If result is NEGATIVE SARS-CoV-2 target nucleic acids are NOT DETECTED. The SARS-CoV-2 RNA is generally detectable in upper and lower  respiratory specimens during the acute phase of infection. The lowest  concentration of SARS-CoV-2 viral copies this assay can detect is 250  copies / mL. A negative result does not preclude SARS-CoV-2 infection  and should not be used as the sole basis for treatment or other  patient management decisions.  A negative result may occur with  improper specimen collection / handling, submission of specimen other  than nasopharyngeal swab, presence of viral mutation(s) within the  areas targeted by this assay, and inadequate number of viral copies  (<250 copies / mL). A negative result must be combined with clinical  observations, patient history, and epidemiological information. If result is POSITIVE SARS-CoV-2 target nucleic acids are DETECTED. The SARS-CoV-2 RNA is generally detectable in upper and lower  respiratory specimens dur ing the acute phase of infection.  Positive  results are indicative of active infection with  SARS-CoV-2.  Clinical  correlation with patient history and other diagnostic information is  necessary to determine patient infection status.  Positive results do  not rule out bacterial infection or co-infection with other viruses. If result is PRESUMPTIVE POSTIVE SARS-CoV-2 nucleic acids MAY BE PRESENT.   A presumptive positive result was obtained on the submitted specimen  and confirmed on repeat testing.  While 2019 novel coronavirus  (SARS-CoV-2) nucleic acids may be present in the submitted sample  additional confirmatory testing may be necessary for epidemiological  and / or clinical management purposes  to differentiate between  SARS-CoV-2 and other Sarbecovirus currently known to infect humans.  If clinically indicated additional testing with an alternate test  methodology [phone]) is advised. The SARS-CoV-2 RNA is generally  detectable in upper and lower respiratory  sp ecimens during the acute  phase of infection. The expected result is Negative. Fact Sheet for Patients:  StrictlyIdeas.no Fact Sheet for Healthcare Providers: BankingDealers.co.za This test is not yet approved or cleared by the [place] FDA and has been authorized for detection and/or diagnosis of SARS-CoV-2 by FDA under an Emergency Use Authorization (EUA).  This EUA will remain in effect (meaning this test can be used) for the duration of the COVID-19 declaration under Section 564(b)(1) of the Act, 21 U.S.C. section 360bbb-3(b)(1), unless the authorization is terminated or revoked sooner. Performed at [facility], [place] [address] [place], [place] [address]   Blood culture (routine x 2)     Status: None (Preliminary result)   Collection Time: 03/19/19  4:29 AM  Result Value Ref Range Status   Specimen Description   Final    BLOOD RIGHT ARM Performed at [facility] [address] [place], [place] [address]     Special Requests   Final    BOTTLES DRAWN AEROBIC AND ANAEROBIC Blood Culture adequate volume Performed at [facility] [address], [place], [place] [address]    Culture   Final    NO GROWTH 4 DAYS Performed at [facility], [place] [address], [place], [place] [address]    Report Status PENDING  Incomplete  Blood culture (routine x 2)     Status: None (Preliminary result)   Collection Time: 03/19/19  4:37 AM  Result Value Ref Range Status   Specimen Description   Final    BLOOD RIGHT ANTECUBITAL Performed at [facility] [address], [place], [place] [address]    Special Requests   Final    BOTTLES DRAWN AEROBIC AND ANAEROBIC Blood Culture results may not be optimal due to an excessive volume of blood received in culture bottles Performed at [facility] [address] [place], [place] [address]    Culture   Final    NO GROWTH 4 DAYS Performed at [facility], [place] [address] [place], [place] [address]    Report Status PENDING  Incomplete  Surgical pcr screen     Status: None   Collection Time: 03/20/19  4:25 AM  Result Value Ref Range Status   MRSA, PCR NEGATIVE NEGATIVE Final   Staphylococcus aureus NEGATIVE NEGATIVE Final    Comment: (NOTE) The Xpert SA Assay (FDA approved for NASAL specimens in patients 61 years of age and older), is one component of a comprehensive surveillance program. It is not intended to diagnose infection nor to guide or monitor treatment. Performed at [facility], [place] [address] [place], [place] [address]      Labs: Basic Metabolic Panel: Recent Labs  Lab 03/19/19 0043 03/20/19 0519 03/21/19 0413 03/22/19 0408  NA 138 138 139 138  K 3.7 3.8 4.3 4.3  CL 102 102 102 105  CO2 27 26 26 26   GLUCOSE 158* 130* 141* 121*  BUN 14 9 11 16   CREATININE 1.49* 1.42* 1.30* 1.49*  CALCIUM 9.1 9.1 9.1 8.4*   Liver Function Tests: Recent Labs  Lab  03/19/19 0043 03/20/19 0519 03/21/19 0413 03/22/19 0408  AST 39 28 67* 33  ALT 20 17 28 20   ALKPHOS 89 75 68 54  BILITOT 0.5 0.8 0.6 0.8  PROT 6.8 6.6 6.5 5.5*  ALBUMIN 3.3* 3.1* 3.0* 2.5*   Recent Labs  Lab 03/19/19 0043  LIPASE 28   No results  for input(s): AMMONIA in the last 168 hours. CBC: Recent Labs  Lab 03/19/19 0043 03/20/19 0519 03/21/19 0413 03/22/19 0408 03/23/19 0946  WBC 6.6 6.2 10.7* 6.7 8.5  NEUTROABS 4.8  --   --   --   --   HGB 12.1* 12.2* 11.9* 9.2* 10.5*  HCT 38.2* 38.1* 37.6* 29.8* 34.1*  MCV 100.5* 101.1* 100.3* 101.7* 103.0*  PLT 226 223 240 199 295       Signed:  Oswald Hillock MD.  [facility] 03/23/2019, 11:41 AM

## 2019-03-23 NOTE — TOC Initial Note (Signed)
Transition of Care [facility]) - Initial/Assessment Note    Patient Details  Name: Gary [name] MRN: [id number] Date of Birth: 06/14/1941  Transition of Care [facility]) CM/SW Contact:    Erenest Rasher, RN Phone Number: 03/23/2019, 5:28 PM  Clinical Narrative:                 Spoke to pt and he lives at home alone. His sons live out of town and their assistance is limited. States he does not have a way to go and get medications. TOC CM picked up meds from his [facility] and given to his Unit RN. PTAR arranged. Offered choice for [facility]. Pt agreeable to [place]. Needs RW, contacted [place] for RW to be delivered to room.   400 pm TOC CM delivered RW to room. Faxed signed paperwork to [place], [employer] rep to process.    Expected Discharge Plan: [place] Barriers to Discharge: No Barriers Identified   Patient Goals and CMS Choice Patient states their goals for this hospitalization and ongoing recovery are:: will have to make it  CMS Medicare.gov Compare Post Acute Care list provided to:: Patient Choice offered to / list presented to : Patient  Expected Discharge Plan and Services Expected Discharge Plan: [place]   Discharge Planning Services: CM Consult Post Acute Care Choice: Home Health, Durable Medical Equipment Living arrangements for the past 2 months: Single Family Home Expected Discharge Date: 03/23/19               DME Arranged: Gilford Rile rolling DME Agency: [employer] Date DME Agency Contacted: 03/23/19 Time DME Agency Contacted: 44 Representative spoke with at DME Agency: [place]: PT, RN, Nurse's Aide, Social Work [employer] Agency: [place]) Date [place]: 03/23/19 Time [place]: 1142 Representative spoke with at [place]: Floydene Flock  Prior Living Arrangements/Services Living arrangements for the past 2 months: [place] with:: Self Patient  language and need for interpreter reviewed:: Yes Do you feel safe going back to the place where you live?: Yes      Need for Family Participation in Patient Care: Yes (Comment) Care giver support system in place?: No (comment)   Criminal Activity/Legal Involvement Pertinent to Current Situation/Hospitalization: No - Comment as needed  Activities of Daily Living Home Assistive Devices/Equipment: Eyeglasses, Dentures (specify type), Cane (specify quad or straight)(upper/lower dentures, single point cane) ADL Screening (condition at time of admission) Patient's cognitive ability adequate to safely complete daily activities?: Yes Is the patient deaf or have difficulty hearing?: Yes(wears bilateral hearing aids) Does the patient have difficulty seeing, even when wearing glasses/contacts?: No Does the patient have difficulty concentrating, remembering, or making decisions?: No Patient able to express need for assistance with ADLs?: Yes Does the patient have difficulty dressing or bathing?: No Independently performs ADLs?: Yes (appropriate for developmental age) Does the patient have difficulty walking or climbing stairs?: Yes(secondary to chronic pain) Weakness of Legs: Both Weakness of Arms/Hands: None  Permission Sought/Granted Permission sought to share information with : Case Manager, PCP, Family Supports Permission granted to share information with : Yes, Verbal Permission Granted  Share Information with NAME: Ezariah Nace  Permission granted to share info w AGENCY: [place], [place] granted to share info w Relationship: son  Permission granted to share info w Contact Information: [phone]  Emotional Assessment Appearance:: Appears stated age Attitude/Demeanor/Rapport: Engaged Affect (typically observed): Accepting Orientation: :  Oriented to Self, Oriented to Place, Oriented to  Time, Oriented to Situation   Psych Involvement: No  (comment)  Admission diagnosis:  Cholecystitis [K81.9] Vomiting and diarrhea [R11.10, R19.7] Pneumonia of right upper lobe due to infectious organism [facility]) [J18.1] Patient Active Problem List   Diagnosis Date Noted  . Acute cholecystitis 03/19/2019  . Post PTCA 09/13/2016  . Exposed mandible 05/20/2014  . . 03/26/2013  . CAP (community acquired pneumonia) 03/26/2013  . COPD with acute exacerbation ([place]) 03/26/2013  . Pulmonary nodule 01/06/2013  . Diastolic dysfunction, with good LVF 2D Feb 2014 12/25/2012  . Chronic anticoagulation, new Feb 2014- Xarelto 12/25/2012  . PAF, new Feb 2014 with SSS component 12/24/2012  . 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 Hip fracture, left, S/P surgical repair 12/22/12 12/21/2012  . Coronary artery disease   . HTN (hypertension)   . Anxiety   . Sleep apnea   . Chronic kidney disease. stage 2   . GERD (gastroesophageal reflux disease)   . RA (rheumatoid arthritis) ([place])   . Gout   . Anemia   . Hepatitis   . Chronic back pain   . Dyslipidemia   . Hypothyroid   . CAD, LAD stent 2002    PCP:  Deland Pretty, MD Pharmacy:   [facility] [place], [place] - [address] [place] [address] Phone: [phone] Fax: [phone]     Social Determinants of Health (SDOH) Interventions    Readmission Risk Interventions No flowsheet data found.

## 2019-03-24 ENCOUNTER — Telehealth: Payer: Self-pay | Admitting: *Deleted

## 2019-03-24 LAB — CULTURE, BLOOD (ROUTINE X 2)
Culture: NO GROWTH
Culture: NO GROWTH
Special Requests: ADEQUATE

## 2019-03-24 LAB — CBC
HCT: 29.8 % — ABNORMAL LOW (ref 39.0–52.0)
Hemoglobin: 9.2 g/dL — ABNORMAL LOW (ref 13.0–17.0)
MCH: 31.4 pg (ref 26.0–34.0)
MCHC: 30.9 g/dL (ref 30.0–36.0)
MCV: 101.7 fL — ABNORMAL HIGH (ref 80.0–100.0)
Platelets: 199 K/uL (ref 150–400)
RBC: 2.93 MIL/uL — ABNORMAL LOW (ref 4.22–5.81)
RDW: 15.6 % — ABNORMAL HIGH (ref 11.5–15.5)
WBC: 6.7 K/uL (ref 4.0–10.5)
nRBC: 0 % (ref 0.0–0.2)

## 2019-03-24 NOTE — Telephone Encounter (Signed)
Received call from pt and had question about his Xarelto. Explained on his AVS shows to continue on medication. States his friend from [place] brought him some food. Jonnie Finner RN CCM Case Mgmt phone [phone]

## 2019-03-28 ENCOUNTER — Other Ambulatory Visit: Payer: Self-pay | Admitting: *Deleted

## 2019-03-28 NOTE — Patient Outreach (Signed)
[facility]) Care Management  03/28/2019  Gary [name] 11-Jan-1941 [id number]   EMMI-general discharge from [place] Day # 1 Date: 03/26/19 1034 Tuesday  Red Alert Reason: Scheduled follow-up? No Unfilled prescriptions? Yes Able to fill today/tomorrow? No  Insurance: united health care medicare   [facility] admissions x 1  ED visits x 1 in the last 6 months   Outreach attempt # 1 No answer. [facility] RN CM left HIPAA compliant voicemail message along with CM's contact info.   Plan: [facility] RN CM sent an unsuccessful outreach letter and scheduled this patient for another call attempt within 4 business days    Gary Brousseau L. Lavina Hamman, RN, BSN, [place] Coordinator Office number [phone] Mobile number [phone]  Main [facility] number [phone] Fax number [phone]

## 2019-03-29 ENCOUNTER — Other Ambulatory Visit: Payer: Self-pay | Admitting: *Deleted

## 2019-03-29 ENCOUNTER — Other Ambulatory Visit: Payer: Self-pay

## 2019-03-29 NOTE — Patient Outreach (Signed)
  [place] [facility]) Care Management  03/29/2019  Gary [name] 24-Nov-1940 [id number]   EMMI-general discharge from [place] Day # 1 Date: 03/26/19 1034 Tuesday  Red Alert Reason: Scheduled follow-up? No Unfilled prescriptions? Yes Able to fill today/tomorrow? No  Insurance: united health care medicare   [facility] admissions x 1  ED visits x 1 in the last 6 months  last Admit date: 03/19/2019 Discharge date: 03/23/2019 for acute cholecystitis D/c home with [place] (PT, RN, Nurse's Aide, Social Work.) Needs RW, contacted [place] for RW    Patient is able to verify HIPAA Reviewed and addressed EMMI red alert/referral to [facility] with patient   EMMI  Gary [name] reports parts of the EMMI answers were correct related to only the prescriptions.   Scheduled follow-up? Gary [name] report he did have a follow up with the [place] surgeon's RN on 03/28/19 and he report no medical concerns Unfilled prescriptions?  Gary [name] reports having to get his prednisone filled after discharge home and his neighbor assisted with doing this for him    He confirms Acute cholecystitis with gall bladder removal He states he has wonder improvements  thanks to his faith in God  He voiced interest in community resources for meals on wheels and food resources when CM inquired about any necessity needs during COVID 19 stay at home time.   Transition of care assessment questions asked    Social: Gary [name] is retired and lives alone but has assist of sons, neighbors and church members. His sons live out of town He denies need for transportation to medical appointments He reports being independent with all his care needs He gets transportation assist from family and friends   Conditions: HTN, CAD, PAD, sleep apnea, COPD,  GERD, Hepatitis, T2N1 SCCA of right retromolar trigone, hypothyroid, RA, hip fx, CKD stage 2 chronic anticoagulation, anemia, HLD, post PTCA,  exposed mandible, chronic back pain, diastolic dysfunction with good LVF 2D Feb 2014 hx of CAP, pulmonary nodule    DME: eyeglasses, dentures, cane, RW  Medications:Had ran out of prednisone It was called to the  pharmacy and picked up by a neighbor He otherwise denies concerns with taking medications as prescribed, affording medications, side effects of medications and questions about medications  Appointments: Apr 02 2019 f/u [place] surgery Advance Directives: he has a living will and a POA   Consent: [facility] RN CM reviewed [facility] services with patient. Patient gave verbal consent for services [facility] telephonic RN CM and [facility].  Plans [facility] RN CM will refer Gary [name] to [facility] for interest in meals on wheels and community food resources [facility] RN CM will close telephonic case at this time as patient has been assessed and no needs identified/needs resolved.   Pt encouraged to return a call to [facility] RN CM prn . Route note to MDs  Joelene Millin L. Lavina Hamman, RN, BSN, [place] Coordinator Office number [phone] Mobile number [phone]  Main [facility] number [phone] Fax number [phone]

## 2019-04-02 ENCOUNTER — Other Ambulatory Visit: Payer: Self-pay

## 2019-04-02 NOTE — Patient Outreach (Signed)
[place] [facility]) Care Management  04/02/2019  Gary [name] August 17, 1941 [id number]  BSW received referral on Saturday, 03/30/19 to contact patient regarding food resources/ Meals on Wheels.   Successful outreach to patient today, 04/03/19.  BSW explained reason for referral and patient stated "it's too late now because I've already got food."  BSW informed patient about [place] as well as Meals on Wheels and offered to submit referrals for both.  Patient declined.  BSW asked patient if he would like my contact information so that he can call me if he changes his mind and he stated "No, I won't use it". BSW closing case.  Gary [name], BSW Social Worker [phone]

## 2019-04-03 DIAGNOSIS — Z87898 Personal history of other specified conditions: Secondary | ICD-10-CM | POA: Diagnosis not present

## 2019-04-03 DIAGNOSIS — Z8719 Personal history of other diseases of the digestive system: Secondary | ICD-10-CM | POA: Diagnosis not present

## 2019-04-04 DIAGNOSIS — D509 Iron deficiency anemia, unspecified: Secondary | ICD-10-CM | POA: Diagnosis not present

## 2019-04-04 DIAGNOSIS — D649 Anemia, unspecified: Secondary | ICD-10-CM | POA: Diagnosis not present

## 2019-04-18 DIAGNOSIS — Z7901 Long term (current) use of anticoagulants: Secondary | ICD-10-CM | POA: Diagnosis not present

## 2019-04-18 DIAGNOSIS — Z7952 Long term (current) use of systemic steroids: Secondary | ICD-10-CM | POA: Diagnosis not present

## 2019-04-18 DIAGNOSIS — M353 Polymyalgia rheumatica: Secondary | ICD-10-CM | POA: Diagnosis not present

## 2019-04-18 DIAGNOSIS — Z0000 Encounter for general adult medical examination without abnormal findings: Secondary | ICD-10-CM | POA: Diagnosis not present

## 2019-04-22 DIAGNOSIS — G894 Chronic pain syndrome: Secondary | ICD-10-CM | POA: Diagnosis not present

## 2019-04-24 ENCOUNTER — Telehealth: Payer: Self-pay | Admitting: Hematology

## 2019-04-24 DIAGNOSIS — M19041 Primary osteoarthritis, right hand: Secondary | ICD-10-CM | POA: Diagnosis not present

## 2019-04-24 DIAGNOSIS — M85871 Other specified disorders of bone density and structure, right ankle and foot: Secondary | ICD-10-CM | POA: Diagnosis not present

## 2019-04-24 DIAGNOSIS — M1180 Other specified crystal arthropathies, unspecified site: Secondary | ICD-10-CM | POA: Diagnosis not present

## 2019-04-24 DIAGNOSIS — M79671 Pain in right foot: Secondary | ICD-10-CM | POA: Diagnosis not present

## 2019-04-24 DIAGNOSIS — M85872 Other specified disorders of bone density and structure, left ankle and foot: Secondary | ICD-10-CM | POA: Diagnosis not present

## 2019-04-24 DIAGNOSIS — L409 Psoriasis, unspecified: Secondary | ICD-10-CM | POA: Diagnosis not present

## 2019-04-24 DIAGNOSIS — Z8739 Personal history of other diseases of the musculoskeletal system and connective tissue: Secondary | ICD-10-CM | POA: Diagnosis not present

## 2019-04-24 DIAGNOSIS — M6530 Trigger finger, unspecified finger: Secondary | ICD-10-CM | POA: Diagnosis not present

## 2019-04-24 DIAGNOSIS — M79641 Pain in right hand: Secondary | ICD-10-CM | POA: Diagnosis not present

## 2019-04-24 DIAGNOSIS — M79642 Pain in left hand: Secondary | ICD-10-CM | POA: Diagnosis not present

## 2019-04-24 DIAGNOSIS — Z7952 Long term (current) use of systemic steroids: Secondary | ICD-10-CM | POA: Diagnosis not present

## 2019-04-24 NOTE — Telephone Encounter (Signed)
Referral received from [facility] Dr. Shelia Media, for pt to see hem /dx of anemia. Gary [name] has been cld and scheduled to see Gary [name] on 6/26 at 2:20pm. Aware to arrive 20 minutes early.

## 2019-04-26 DIAGNOSIS — Z1212 Encounter for screening for malignant neoplasm of rectum: Secondary | ICD-10-CM | POA: Diagnosis not present

## 2019-05-02 ENCOUNTER — Telehealth: Payer: Self-pay

## 2019-05-02 ENCOUNTER — Other Ambulatory Visit: Payer: Self-pay

## 2019-05-02 NOTE — Telephone Encounter (Signed)
Called and left voicemail regarding pre-screening questions for appt on 6/26

## 2019-05-03 ENCOUNTER — Inpatient Hospital Stay: Payer: Medicare Other

## 2019-05-03 ENCOUNTER — Encounter: Payer: Self-pay | Admitting: Internal Medicine

## 2019-05-03 ENCOUNTER — Other Ambulatory Visit: Payer: Self-pay

## 2019-05-03 ENCOUNTER — Telehealth: Payer: Self-pay | Admitting: Internal Medicine

## 2019-05-03 ENCOUNTER — Inpatient Hospital Stay: Payer: Medicare Other | Attending: Internal Medicine | Admitting: Internal Medicine

## 2019-05-03 VITALS — BP 134/71 | HR 64 | Temp 98.3°F | Resp 18 | Ht 75.0 in | Wt 163.2 lb

## 2019-05-03 DIAGNOSIS — I2510 Atherosclerotic heart disease of native coronary artery without angina pectoris: Secondary | ICD-10-CM

## 2019-05-03 DIAGNOSIS — I10 Essential (primary) hypertension: Secondary | ICD-10-CM | POA: Diagnosis not present

## 2019-05-03 DIAGNOSIS — N289 Disorder of kidney and ureter, unspecified: Secondary | ICD-10-CM | POA: Diagnosis not present

## 2019-05-03 DIAGNOSIS — D509 Iron deficiency anemia, unspecified: Secondary | ICD-10-CM

## 2019-05-03 DIAGNOSIS — D7589 Other specified diseases of blood and blood-forming organs: Secondary | ICD-10-CM

## 2019-05-03 DIAGNOSIS — M109 Gout, unspecified: Secondary | ICD-10-CM

## 2019-05-03 DIAGNOSIS — R911 Solitary pulmonary nodule: Secondary | ICD-10-CM | POA: Diagnosis not present

## 2019-05-03 DIAGNOSIS — D508 Other iron deficiency anemias: Secondary | ICD-10-CM

## 2019-05-03 DIAGNOSIS — E039 Hypothyroidism, unspecified: Secondary | ICD-10-CM

## 2019-05-03 LAB — CMP (CANCER CENTER ONLY)
ALT: 12 U/L (ref 0–44)
AST: 26 U/L (ref 15–41)
Albumin: 3.4 g/dL — ABNORMAL LOW (ref 3.5–5.0)
Alkaline Phosphatase: 109 U/L (ref 38–126)
Anion gap: 8 (ref 5–15)
BUN: 15 mg/dL (ref 8–23)
CO2: 30 mmol/L (ref 22–32)
Calcium: 9.2 mg/dL (ref 8.9–10.3)
Chloride: 104 mmol/L (ref 98–111)
Creatinine: 1.75 mg/dL — ABNORMAL HIGH (ref 0.61–1.24)
GFR, Est AFR Am: 42 mL/min — ABNORMAL LOW (ref >60)
GFR, Estimated: 36 mL/min — ABNORMAL LOW (ref >60)
Glucose, Bld: 98 mg/dL (ref 70–99)
Potassium: 4.4 mmol/L (ref 3.5–5.1)
Sodium: 142 mmol/L (ref 135–145)
Total Bilirubin: 0.5 mg/dL (ref 0.3–1.2)
Total Protein: 7.6 g/dL (ref 6.5–8.1)

## 2019-05-03 LAB — CBC WITH DIFFERENTIAL (CANCER CENTER ONLY)
Abs Immature Granulocytes: 0.01 10*3/uL (ref 0.00–0.07)
Basophils Absolute: 0 10*3/uL (ref 0.0–0.1)
Basophils Relative: 1 %
Eosinophils Absolute: 0.2 10*3/uL (ref 0.0–0.5)
Eosinophils Relative: 5 %
HCT: 32.2 % — ABNORMAL LOW (ref 39.0–52.0)
Hemoglobin: 10.2 g/dL — ABNORMAL LOW (ref 13.0–17.0)
Immature Granulocytes: 0 %
Lymphocytes Relative: 23 %
Lymphs Abs: 1.2 10*3/uL (ref 0.7–4.0)
MCH: 30.1 pg (ref 26.0–34.0)
MCHC: 31.7 g/dL (ref 30.0–36.0)
MCV: 95 fL (ref 80.0–100.0)
Monocytes Absolute: 0.5 10*3/uL (ref 0.1–1.0)
Monocytes Relative: 9 %
Neutro Abs: 3.2 10*3/uL (ref 1.7–7.7)
Neutrophils Relative %: 62 %
Platelet Count: 286 10*3/uL (ref 150–400)
RBC: 3.39 MIL/uL — ABNORMAL LOW (ref 4.22–5.81)
RDW: 14.7 % (ref 11.5–15.5)
WBC Count: 5.1 10*3/uL (ref 4.0–10.5)
nRBC: 0 % (ref 0.0–0.2)

## 2019-05-03 LAB — LACTATE DEHYDROGENASE: LDH: 160 U/L (ref 98–192)

## 2019-05-03 LAB — VITAMIN B12: Vitamin B-12: 516 pg/mL (ref 180–914)

## 2019-05-03 LAB — FOLATE: Folate: 24.5 ng/mL (ref >5.9)

## 2019-05-03 NOTE — Telephone Encounter (Signed)
Called and spoke with patient. Confirmed date and time of phone visit

## 2019-05-03 NOTE — Progress Notes (Signed)
Referring Physician:  Dr. Deland Pretty  Diagnosis Other iron deficiency anemia - Plan: CBC with Differential ([place] Only), CMP ([place] only), Lactate dehydrogenase (LDH), Ferritin, Iron and TIBC, Vitamin B12, Folate, Serum, Methylmalonic acid, serum, SPEP with reflex to IFE  Macrocytosis - Plan: CBC with Differential ([place] Only), CMP ([place] only), Lactate dehydrogenase (LDH), Ferritin, Iron and TIBC, Vitamin B12, Folate, Serum, Methylmalonic acid, serum, SPEP with reflex to IFE  Staging Cancer Staging No matching staging information was found for the patient.  Assessment and Plan:  1.  Iron deficiency anemia.  78 year old male referred for evaluation due to Iron deficiency anemia.  ( IDA).  Last IV iron was 10/13/2017.  He denies any blood in stool or urine.  He reports has had colonoscopy done in the past.  Pt had labs done 03/22/2019 that showed WBC 6.7 HB 9.2 MCV 102 plts 199,000.  Chemistries showed K+ 4.3 Cr 1.49 and normal LFTs.  Labs done 03/23/2019 showed WBC 8.5 HB 10.5  MCV 103 plts 295,000.  Labs done 04/04/2019 shwoed iron level of 18.  Pt is seen today for consultation due to iron deficiency anemia.    Labs done today 05/03/2019 reviewed and showed WBC 5.1 HB 10.2 plts 286,000.  MCV 95.  Chemistries showed K+ 4.4 Cr 1.75 and normal LFTs.  Awaiting results of  Ferritin, Iron and TIBC, Vitamin B12, Folate, Serum, Methylmalonic acid, serum, SPEP with reflex to IFE.  Pt will be notified of iron results and if findings consistent with iron deficiency anemia he will be recommended for IV iron.  Potential side effects of IV discussed with pt and include an allergic reaction.  Pt will have phone follow-up in 05/2019 to go over labs.    2.  Macrocytosis.  Outside CBC done 03/23/2019 showed MCV of 103.  Labs done today 05/03/2019 showed HB 10.2 and MCV of 95.  Awaiting results of B12, folate, MMA.  Pt will have phone visit follow-up in 05/2019 to go over results.    3.   Renal insufficiency.  Cr noted to be 1.75 on labs done today 05/03/2019.  Previous outside labs done on 03/22/2019 showed Cr 1.49.  Awaiting results of SPEP.  Pt will have phone follow-up in 05/2019 to go over results.   4.  Hypertension.  BP is 134/71.  Follow-up with PCP for ongoing monitoring.    5.  Hypothyroidism.  Recent TSH in 03/2019 was elevated at 7.6.  Hypothyroidism can also play a role in anemia.  Pt should follow-up with PCP for monitoring and management.    6. Gout.  Pt is on tapering dose of Prednisone.    7.  CAD.  Pt reports he is no longer on ASA but is on Xarelto.  He denies any bleeding episodes.    8.  Pulmonary nodule/Lung densities.  Pt CT agio of CAP done 03/19/2019 that showed  Impression: 1. No aortic aneurysm or dissection. No pulmonary artery embolus identified. 2. Cholelithiasis with findings of acute cholecystitis. Further evaluation with ultrasound recommended. 3. Cluster of nodular densities in the right upper lobe concerning for an infectious process such as atypical pneumonia. Clinical correlation and follow-up recommended. 4. An 8 mm right lower lobe nodular density is also likely infectious in etiology. Non-contrast chest CT at 6-12 months is recommended. If the nodule is stable at time of repeat CT, then future CT at 18-24 months (from today's scan) is considered optional for low-risk patients, but is recommended for  high-risk patients. This recommendation follows the consensus statement: Guidelines for Management of Incidental Pulmonary Nodules Detected on CT Images: From the Fleischner Society 2017; Radiology 2017; 284:228-243. 5. 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 6. Sigmoid diverticulosis. No bowel obstruction or active inflammation. 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  Pt had surgery at [facility] on 01/22/2013 with pathology showing Right Tonsillar fossa and right radical neck dissection: Invasive SCC moderately differentiated. 1/21 LN involved with metastatic SCC.  Margins were negative.  He was staged as T2N1 SCC.    He was treated with Adjuvant RT which per records was completed 04/23/2013.    Pt is followed by Dr. Janace Hoard of ENT and was last seen by him on 07/10/2018.   He had CT neck done 07/10/2018 that showed  IMPRESSION: 1. No evidence of recurrent tumor. 2. 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 3. Chronic right maxillary sinusitis   Will refer to pulmonary for ongoing monitoring of pulmonary nodule which was 8 mm on recent imaging.  Pt had COVID testing on 03/19/2019 that was negative.    9.  Health maintenance.  GI follow-up per PCP.    40 minutes spent with more than 50% spent in review of records, counseling and coordination of care.     HPI:  78 year old male referred for evaluation due to Iron deficiency anemia.  ( IDA).  Last IV iron was 10/13/2017.  He denies any blood in stool or urine.  He reports has had colonoscopy done in the past.  Pt had labs done 03/22/2019 that showed WBC 6.7 HB 9.2 MCV 102 plts 199,000.  Chemistries showed K+ 4.3 Cr 1.49 and normal LFTs.  Labs done 03/23/2019 showed WBC 8.5 HB 10.5  MCV 103 plts 295,000.  Labs done 04/04/2019 showed iron level of 18.  Pt has reported history of Head and neck cancer.  Pt is seen today for consultation due to iron deficiency anemia.    Problem List Patient Active Problem List   Diagnosis Date Noted  . Acute cholecystitis [K81.0] 03/19/2019  . Post PTCA [Z98.61] 09/13/2016  . Exposed mandible [R29.898] 05/20/2014  . . [J96.01] 03/26/2013  . CAP (community acquired pneumonia) [J18.9] 03/26/2013  . COPD with acute exacerbation ([place]) [J44.1] 03/26/2013  . Pulmonary nodule [R91.1] 01/06/2013  . Diastolic dysfunction, with good LVF 2D Feb  2014 [I51.89] 12/25/2012  . Chronic anticoagulation, new Feb 2014- Xarelto [Z79.01] 12/25/2012  . PAF, new Feb 2014 with SSS component [I48.0] 12/24/2012  . T2N1 SCCA of right retromolar trigone. [C06.2] 12/24/2012  . Hip fracture, left, S/P surgical repair 12/22/12 [S72.002A] 12/21/2012  . Coronary artery disease [I25.10]   . HTN (hypertension) [I10]   . Anxiety [F41.9]   . Sleep apnea [G47.30]   . 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 GERD (gastroesophageal reflux disease) [K21.9]   . RA (rheumatoid arthritis) ([place]) [M06.9]   . Gout [M10.9]   . Anemia [D64.9]   . Hepatitis [K75.9]   . Chronic back pain [M54.9, G89.29]   . Dyslipidemia [E78.5]   . Hypothyroid [E03.9]   . CAD, LAD stent 2002 [I25.10]     Past Medical History Past Medical History:  Diagnosis Date  . Anemia    "chronic anemic"  . Anginal pain ([place])   .  Anxiety   . 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 Cancer ([place])    pre-cancerous lesions removed 11/22/12 and hx of  . Cancer ([place]) 10/01/12   R retromolar trigone, squamous cell  . Chronic back pain    hx of  . Chronic kidney disease    renal insufficiency, [place] kidney  . Coronary artery disease    stent placement 2000, does not see cardiologist at this time  . Dyslipidemia   . Full dentures    Edentulous since age [age]  . GERD (gastroesophageal reflux disease)   . Gout    pseudo gout  . Hearing impairment    job related, hbilat hearing aids  . Hepatitis    hepatitis "C"  . History of radiation therapy 03/11/2013   thru -04/23/2013;  right retromolar trigone/54Gy/5fx  . Hypertension    sees Dr. Deland Pretty  . Hypertension   . Hypothyroid   . Insomnia    cpap  . Rheumatoid arthritis ([place])    "all over" (09/13/2016)  . Sleep apnea    "tried mask 3 different times; couldn't tolerate it" (09/13/2016)  . Thyroid disease    Hypothyroidism    Past Surgical History Past Surgical History:  Procedure  Laterality Date  . CARDIAC CATHETERIZATION N/A 09/13/2016   Procedure: Left Heart Cath and Coronary Angiography;  Surgeon: Adrian Prows, MD;  Location: [place];  Service: Cardiovascular;  Laterality: N/A;  . CARDIAC CATHETERIZATION N/A 10/11/2016   Procedure: Coronary Stent Intervention;  Surgeon: Adrian Prows, MD;  Location: [facility];  Service: Cardiovascular;  Laterality: N/A;  LAD & 1st Diag  . CATARACT EXTRACTION W/ INTRAOCULAR LENS  IMPLANT, BILATERAL Bilateral 2007  . CHOLECYSTECTOMY N/A 03/20/2019   Procedure: LAPAROSCOPIC CHOLECYSTECTOMY;  Surgeon: Clovis Riley, MD;  Location: [facility];  Service: General;  Laterality: N/A;  . CORONARY ANGIOPLASTY  09/13/2016  . CORONARY ANGIOPLASTY WITH STENT PLACEMENT  2002  . FRACTURE SURGERY    . HIP PINNING,CANNULATED Left 12/22/2012   Procedure: CANNULATED HIP PINNING;  Surgeon: Mcarthur Rossetti, MD;  Location: [facility];  Service: Orthopedics;  Laterality: Left;  [name] MULTIPLE TOOTH EXTRACTIONS  ~ 1962   Edentulous  . RADICAL NECK DISSECTION Right 01/22/2013   [facility]  . SKIN CANCER EXCISION     "face, ears"  . SKIN GRAFT Left ~ 1961   "grafted skin off my stomach & put it on left hand that I had caught in fan belt"  . TONSILLECTOMY      Family History Family History  Problem Relation Age of Onset  . Coronary artery disease Brother 76  . Heart attack Brother   . Coronary artery disease Father        stents in 70's  . Cancer Mother        bone     Social History  reports that he quit smoking about 35 years ago. His smoking use included cigarettes. He has a 27.00 pack-year smoking history. He has never used smokeless tobacco. He reports current alcohol use. He reports that he does not use drugs.  Medications  Current Outpatient Medications:  .  acyclovir (ZOVIRAX) 400 MG tablet, Take 400 mg by mouth 2 (two) times daily. , Disp: , Rfl:  .  aspirin EC 81 MG tablet, Take 81 mg by mouth daily., Disp: , Rfl:  .   atorvastatin (LIPITOR) 40 MG tablet, Take 40 mg by mouth 2 (two)  times daily. , Disp: , Rfl:  .  Calcium Carbonate-Vitamin D (CALCIUM-D) 600-400 MG-UNIT TABS, Take 1 tablet by mouth daily. , Disp: , Rfl:  .  clobetasol (TEMOVATE) 0.05 % external solution, Apply 1 application topically as needed. psoriasis on head, Disp: , Rfl:  .  diazepam (VALIUM) 5 MG tablet, Take 1 tablet (5 mg total) by mouth every 12 (twelve) hours as needed for anxiety. For anxiety (Patient taking differently: Take 5 mg by mouth at bedtime as needed (sleep). For anxiety), Disp: 30 tablet, Rfl: 0 .  EPINEPHrine 0.3 mg/0.3 mL IJ SOAJ injection, Inject 0.3 mg into the muscle as directed. , Disp: , Rfl:  .  levothyroxine (SYNTHROID, LEVOTHROID) 112 MCG tablet, Take 112 mcg daily before breakfast by mouth., Disp: , Rfl:  .  lisinopril (ZESTRIL) 5 MG tablet, Take 5 mg by mouth daily., Disp: , Rfl:  .  magnesium oxide (MAG-OX) 400 MG tablet, Take 400 mg by mouth at bedtime., Disp: , Rfl:  .  meclizine (ANTIVERT) 25 MG tablet, Take 25 mg by mouth every 12 (twelve) hours as needed for dizziness. , Disp: , Rfl:  .  mirtazapine (REMERON) 15 MG tablet, Take 7.5 mg by mouth at bedtime. , Disp: , Rfl:  .  Multiple Vitamin (MULTIVITAMIN WITH MINERALS) TABS tablet, Take 1 tablet by mouth daily., Disp: , Rfl:  .  omega-3 acid ethyl esters (LOVAZA) 1 G capsule, Take 1 g by mouth daily. , Disp: , Rfl:  .  pantoprazole (PROTONIX) 40 MG tablet, Take 40 mg by mouth 2 (two) times daily., Disp: , Rfl:  .  Polyethyl Glycol-Propyl Glycol (SYSTANE) 0.4-0.3 % GEL ophthalmic gel, Place 1 application into both eyes as needed (dry eyes)., Disp: , Rfl:  .  predniSONE (DELTASONE) 5 MG tablet, Take 5 mg by mouth at bedtime. , Disp: , Rfl:  .  propranolol (INDERAL) 10 MG tablet, Take 10 mg by mouth 2 (two) times daily., Disp: , Rfl:  .  sertraline (ZOLOFT) 100 MG tablet, Take 100 mg by mouth at bedtime. , Disp: , Rfl:  .  XARELTO 10 MG TABS tablet, Take 10 mg  by mouth daily., Disp: , Rfl:   Allergies Bee venom, Cellcept [mycophenolate mofetil], Colcrys [colchicine], Erythromycin, Methotrexate derivatives, Neosporin [neomycin-bacitracin zn-polymyx], Trazodone, and Ultram [tramadol]  Review of Systems Review of Systems - Oncology ROS negative   Physical Exam  Vitals Wt Readings from Last 3 Encounters:  05/03/19 163 lb 3.2 oz (74 kg)  03/19/19 170 lb (77.1 kg)  10/05/17 176 lb (79.8 kg)   Temp Readings from Last 3 Encounters:  05/03/19 98.3 F (36.8 C) (Oral)  03/23/19 100.1 F (37.8 C) (Oral)  05/21/18 98 F (36.7 C) (Oral)   BP Readings from Last 3 Encounters:  05/03/19 134/71  03/23/19 127/80  05/21/18 (!) 145/91   Pulse Readings from Last 3 Encounters:  05/03/19 64  03/23/19 69  05/21/18 60    Constitutional: Well-developed, well-nourished, and in no distress.   HENT: Head: Normocephalic and atraumatic.  Mouth/Throat: No oropharyngeal exudate. Mucosa moist. Eyes: Pupils are equal, round, and reactive to light. Conjunctivae are normal. No scleral icterus.  Neck: Normal range of motion. Neck supple. No JVD present.  Cardiovascular: Normal rate, regular rhythm and normal heart sounds.  Exam reveals no gallop and no friction rub.   No murmur heard. Pulmonary/Chest: Effort normal and breath sounds normal. No respiratory distress. No wheezes.No rales.  Abdominal: Soft. Bowel sounds are normal. No distension. There is  no tenderness. There is no guarding.  Musculoskeletal: No edema or tenderness.  Lymphadenopathy: No cervical,axillary or supraclavicular adenopathy.  Neurological: Alert and oriented to person, place, and time. No cranial nerve deficit.  Skin: Skin is warm and dry. No rash noted. No erythema. No pallor.  Psychiatric: Affect and judgment normal.   Labs Appointment on 05/03/2019  Component Date Value Ref Range Status  . Sodium 05/03/2019 142  135 - 145 mmol/L Final  . Potassium 05/03/2019 4.4  3.5 - 5.1 mmol/L  Final  . Chloride 05/03/2019 104  98 - 111 mmol/L Final  . CO2 05/03/2019 30  22 - 32 mmol/L Final  . Glucose, Bld 05/03/2019 98  70 - 99 mg/dL Final  . BUN 05/03/2019 15  8 - 23 mg/dL Final  . Creatinine 05/03/2019 1.75* 0.61 - 1.24 mg/dL Final  . Calcium 05/03/2019 9.2  8.9 - 10.3 mg/dL Final  . Total Protein 05/03/2019 7.6  6.5 - 8.1 g/dL Final  . Albumin 05/03/2019 3.4* 3.5 - 5.0 g/dL Final  . AST 05/03/2019 26  15 - 41 U/L Final  . ALT 05/03/2019 12  0 - 44 U/L Final  . Alkaline Phosphatase 05/03/2019 109  38 - 126 U/L Final  . Total Bilirubin 05/03/2019 0.5  0.3 - 1.2 mg/dL Final  . GFR, Est Non Af Am 05/03/2019 36* >60 mL/min Final  . GFR, Est AFR Am 05/03/2019 42* >60 mL/min Final  . Anion gap 05/03/2019 8  5 - 15 Final   Performed at [facility], [place] [address], [place], [place] [address]  . WBC Count 05/03/2019 5.1  4.0 - 10.5 K/uL Final  . RBC 05/03/2019 3.39* 4.22 - 5.81 MIL/uL Final  . Hemoglobin 05/03/2019 10.2* 13.0 - 17.0 g/dL Final  . HCT 05/03/2019 32.2* 39.0 - 52.0 % Final  . MCV 05/03/2019 95.0  80.0 - 100.0 fL Final  . MCH 05/03/2019 30.1  26.0 - 34.0 pg Final  . MCHC 05/03/2019 31.7  30.0 - 36.0 g/dL Final  . RDW 05/03/2019 14.7  11.5 - 15.5 % Final  . Platelet Count 05/03/2019 286  150 - 400 K/uL Final  . nRBC 05/03/2019 0.0  0.0 - 0.2 % Final  . Neutrophils Relative % 05/03/2019 62  % Final  . Neutro Abs 05/03/2019 3.2  1.7 - 7.7 K/uL Final  . Lymphocytes Relative 05/03/2019 23  % Final  . Lymphs Abs 05/03/2019 1.2  0.7 - 4.0 K/uL Final  . Monocytes Relative 05/03/2019 9  % Final  . Monocytes Absolute 05/03/2019 0.5  0.1 - 1.0 K/uL Final  . Eosinophils Relative 05/03/2019 5  % Final  . Eosinophils Absolute 05/03/2019 0.2  0.0 - 0.5 K/uL Final  . Basophils Relative 05/03/2019 1  % Final  . Basophils Absolute 05/03/2019 0.0  0.0 - 0.1 K/uL Final  . Immature Granulocytes 05/03/2019 0  % Final  . Abs Immature Granulocytes 05/03/2019  0.01  0.00 - 0.07 K/uL Final   Performed at [facility], [place] [address], [place], [place] [address]  . LDH 05/03/2019 160  98 - 192 U/L Final   Performed at [facility], [place] [address] [place], [place] [address]     Pathology Orders Placed This Encounter  Procedures  . CBC with Differential (Cancer Center Only)    Standing Status:   Future    Number of Occurrences:   1    Standing Expiration Date:   05/02/2020  .  CMP ([place] only)    Standing Status:   Future    Number of Occurrences:   1    Standing Expiration Date:   05/02/2020  . Lactate dehydrogenase (LDH)    Standing Status:   Future    Number of Occurrences:   1    Standing Expiration Date:   05/02/2020  . Ferritin    Standing Status:   Future    Number of Occurrences:   1    Standing Expiration Date:   05/02/2020  . Iron and TIBC    Standing Status:   Future    Number of Occurrences:   1    Standing Expiration Date:   05/02/2020  . Vitamin B12    Standing Status:   Future    Number of Occurrences:   1    Standing Expiration Date:   05/02/2020  . Folate, Serum    Standing Status:   Future    Number of Occurrences:   1    Standing Expiration Date:   05/02/2020  . Methylmalonic acid, serum    Standing Status:   Future    Number of Occurrences:   1    Standing Expiration Date:   05/02/2020  . SPEP with reflex to IFE    Standing Status:   Future    Number of Occurrences:   1    Standing Expiration Date:   05/02/2020       Zoila Shutter MD

## 2019-05-06 LAB — FERRITIN: Ferritin: 35 ng/mL (ref 24–336)

## 2019-05-06 LAB — IRON AND TIBC
Iron: 34 ug/dL — ABNORMAL LOW (ref 42–163)
Saturation Ratios: 9 % — ABNORMAL LOW (ref 20–55)
TIBC: 359 ug/dL (ref 202–409)
UIBC: 325 ug/dL (ref 117–376)

## 2019-05-06 LAB — METHYLMALONIC ACID, SERUM: Methylmalonic Acid, Quantitative: 298 nmol/L (ref 0–378)

## 2019-05-07 LAB — PROTEIN ELECTROPHORESIS, SERUM, WITH REFLEX
A/G Ratio: 1.1 (ref 0.7–1.7)
Albumin ELP: 3.6 g/dL (ref 2.9–4.4)
Alpha-1-Globulin: 0.2 g/dL (ref 0.0–0.4)
Alpha-2-Globulin: 0.9 g/dL (ref 0.4–1.0)
Beta Globulin: 1.1 g/dL (ref 0.7–1.3)
Gamma Globulin: 1 g/dL (ref 0.4–1.8)
Globulin, Total: 3.2 g/dL (ref 2.2–3.9)
Total Protein ELP: 6.8 g/dL (ref 6.0–8.5)

## 2019-05-21 DIAGNOSIS — Z85828 Personal history of other malignant neoplasm of skin: Secondary | ICD-10-CM | POA: Diagnosis not present

## 2019-05-21 DIAGNOSIS — L812 Freckles: Secondary | ICD-10-CM | POA: Diagnosis not present

## 2019-05-21 DIAGNOSIS — L308 Other specified dermatitis: Secondary | ICD-10-CM | POA: Diagnosis not present

## 2019-05-21 DIAGNOSIS — Z85820 Personal history of malignant melanoma of skin: Secondary | ICD-10-CM | POA: Diagnosis not present

## 2019-05-21 DIAGNOSIS — L509 Urticaria, unspecified: Secondary | ICD-10-CM | POA: Diagnosis not present

## 2019-05-23 ENCOUNTER — Telehealth: Payer: Self-pay | Admitting: Internal Medicine

## 2019-05-23 NOTE — Telephone Encounter (Signed)
Call pt to remind him of his appt.

## 2019-05-24 ENCOUNTER — Encounter: Payer: Self-pay | Admitting: Internal Medicine

## 2019-05-24 ENCOUNTER — Inpatient Hospital Stay: Payer: Medicare Other | Attending: Internal Medicine | Admitting: Internal Medicine

## 2019-05-24 ENCOUNTER — Ambulatory Visit: Payer: Medicare Other | Admitting: Internal Medicine

## 2019-05-24 DIAGNOSIS — D508 Other iron deficiency anemias: Secondary | ICD-10-CM | POA: Diagnosis not present

## 2019-05-24 DIAGNOSIS — R911 Solitary pulmonary nodule: Secondary | ICD-10-CM

## 2019-05-24 DIAGNOSIS — D509 Iron deficiency anemia, unspecified: Secondary | ICD-10-CM | POA: Insufficient documentation

## 2019-05-24 HISTORY — DX: Iron deficiency anemia, unspecified: D50.9

## 2019-05-24 NOTE — Progress Notes (Signed)
Virtual Visit via Telephone Note  I connected with Gary [name] on 05/24/19 at  2:20 PM EDT by telephone and verified that I am speaking with the correct person using two identifiers.   I discussed the limitations, risks, security and privacy concerns of performing an evaluation and management service by telephone and the availability of in person appointments. I also discussed with the patient that there may be a patient responsible charge related to this service. The patient expressed understanding and agreed to proceed.  Interval history:  Historical data obtained from note dated 05/03/2019.  78 year old male referred for evaluation due to Iron deficiency anemia.  ( IDA).  Last IV iron was 10/13/2017.  He denies any blood in stool or urine.  He reports has had colonoscopy done in the past.  Pt had labs done 03/22/2019 that showed WBC 6.7 HB 9.2 MCV 102 plts 199,000.  Chemistries showed K+ 4.3 Cr 1.49 and normal LFTs.  Labs done 03/23/2019 showed WBC 8.5 HB 10.5  MCV 103 plts 295,000.  Labs done 04/04/2019 showed iron level of 18.  Pt has reported history of Head and neck cancer.     Observations/Objective: Review of labs.     Assessment and Plan:1.  Iron deficiency anemia.  78 year old male referred for evaluation due to Iron deficiency anemia.  ( IDA).  Last IV iron was 10/13/2017.  He denies any blood in stool or urine.  He reports has had colonoscopy done in the past.  Pt had labs done 03/22/2019 that showed WBC 6.7 HB 9.2 MCV 102 plts 199,000.  Chemistries showed K+ 4.3 Cr 1.49 and normal LFTs.  Labs done 03/23/2019 showed WBC 8.5 HB 10.5  MCV 103 plts 295,000.  Labs done 04/04/2019 showed  iron level of 18.  Labs done 05/03/2019 reviewed and showed WBC 5.1 HB 10.2 plts 286,000.  MCV 95.  Chemistries showed K+ 4.4 Cr 1.75 and normal LFTs.  Ferritin low normal at 35.  SPEP negative.  He had normal B12, folate  and MMA.   Pt was last treated with IV iron in 2018.  He is recommended for repeat IV iron with  Feraheme 510 mg IV D1 and D8. Potential side effects of IV discussed with pt and include an allergic reaction. He is referred to GI.  Pt will have labs and follow-up in 08/2019.    2.  Macrocytosis.  Outside CBC done 03/23/2019 showed MCV of 103.  Labs done 05/03/2019 showed HB 10.2 and MCV of 95.  He has normal B12, folate, MMA.  MCV WNL on labs done in 04/2019.    3.  Renal insufficiency.  Cr noted to be 1.75 on labs done today 05/03/2019.  Previous outside labs done on 03/22/2019 showed Cr 1.49.  He has negative SPEP.  Pt is followed by nephrology and should followwill have phone follow-up in 05/2019 to go over results.   4.  Hypertension.  BP was134/71.  Follow-up with PCP for ongoing monitoring.    5.  Hypothyroidism.  Recent TSH in 03/2019 was elevated at 7.6.  Hypothyroidism can also play a role in anemia.  Pt should follow-up with PCP for monitoring and management.    6. Gout.  Pt is on tapering dose of Prednisone.    7.  CAD.  Pt reports he is no longer on ASA but is on Xarelto.  He denies any bleeding episodes.  Follow-up with cardiology as directed.    8.  Pulmonary nodule/Lung densities.  Pt CT agio of CAP done 03/19/2019 that showed  Impression: 1. No aortic aneurysm or dissection. No pulmonary artery embolus identified. 2. Cholelithiasis with findings of acute cholecystitis. Further evaluation with ultrasound recommended. 3. Cluster of nodular densities in the right upper lobe concerning for an infectious process such as atypical pneumonia. Clinical correlation and follow-up recommended. 4. An 8 mm right lower lobe nodular density is also likely infectious in etiology. Non-contrast chest CT at 6-12 months is recommended. If the nodule is stable at time of repeat CT, then future CT at 18-24 months (from today's scan) is considered optional for low-risk patients, but is recommended for high-risk patients. This recommendation follows the consensus statement: Guidelines for Management  of Incidental Pulmonary Nodules Detected on CT Images: From the Fleischner Society 2017; Radiology 2017; 284:228-243. 5. 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 6. Sigmoid diverticulosis. No bowel obstruction or active inflammation. Normal appendix  Pt has history of Head and neck cancer diagnosed 10/01/2012 when he had a biopsy done that showed  Mouth, biopsy, right retromolar trigone - SQUAMOUS CELL CARCINOMA.  Pt had surgery at [facility] on 01/22/2013 with pathology showing Right Tonsillar fossa and right radical neck dissection: Invasive SCC moderately differentiated. 1/21 LN involved with metastatic SCC.  Margins were negative.  He was staged as T2N1 SCC.    He was treated with Adjuvant RT which per records was completed 04/23/2013.    Pt is followed by Dr. Janace Hoard of ENT and was last seen by him on 07/10/2018.   He had CT neck done 07/10/2018 that showed  IMPRESSION: 1. No evidence of recurrent tumor. 2. 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 3. Chronic right maxillary sinusitis   Will refer to pulmonary for ongoing monitoring of pulmonary nodule which was 8 mm on recent imaging.  Pt had COVID testing on 03/19/2019 that was negative.    9.  Health maintenance.  GI follow-up per PCP.     Follow Up Instructions: Schedule for IV iron.  Labs and follow-up in 08/2019.  Pulmonary referral.     I discussed the assessment and treatment plan with the patient. The patient was provided an opportunity to ask questions and all were answered. The patient agreed with the plan and demonstrated an understanding of the instructions.   The patient was advised to call back or seek an in-person evaluation if the symptoms worsen or if the condition fails to improve as anticipated.  I provided 15 minutes of non-face-to-face time during this encounter.   Zoila Shutter, MD

## 2019-05-27 ENCOUNTER — Telehealth: Payer: Self-pay | Admitting: Internal Medicine

## 2019-05-27 NOTE — Telephone Encounter (Signed)
Called and left msg for patient  °

## 2019-05-29 ENCOUNTER — Telehealth: Payer: Self-pay | Admitting: *Deleted

## 2019-05-29 NOTE — Telephone Encounter (Signed)
[name] patient to review upcoming appts with him. He will be receiving  Ferrlecit (Ferric Gluconate) and will require 4 visits, a week apart.  Spoke with patient and provided dates/times for his upcoming appts. He voiced understanding.

## 2019-05-31 ENCOUNTER — Inpatient Hospital Stay: Payer: Medicare Other

## 2019-05-31 ENCOUNTER — Ambulatory Visit: Payer: Medicare Other

## 2019-05-31 ENCOUNTER — Other Ambulatory Visit: Payer: Self-pay

## 2019-05-31 VITALS — BP 126/73 | HR 51 | Temp 98.7°F | Resp 16

## 2019-05-31 DIAGNOSIS — D508 Other iron deficiency anemias: Secondary | ICD-10-CM

## 2019-05-31 DIAGNOSIS — D509 Iron deficiency anemia, unspecified: Secondary | ICD-10-CM | POA: Diagnosis not present

## 2019-05-31 MED ORDER — SODIUM CHLORIDE 0.9 % IV SOLN
Freq: Once | INTRAVENOUS | Status: AC
  Administered 2019-05-31: 14:00:00 via INTRAVENOUS
  Filled 2019-05-31: qty 250

## 2019-05-31 MED ORDER — DIPHENHYDRAMINE HCL 25 MG PO CAPS
25.0000 mg | ORAL_CAPSULE | Freq: Once | ORAL | Status: AC
  Administered 2019-05-31: 25 mg via ORAL

## 2019-05-31 MED ORDER — SODIUM CHLORIDE 0.9 % IV SOLN
250.0000 mg | INTRAVENOUS | Status: DC
  Administered 2019-05-31: 250 mg via INTRAVENOUS
  Filled 2019-05-31: qty 20

## 2019-05-31 MED ORDER — DIPHENHYDRAMINE HCL 25 MG PO CAPS
ORAL_CAPSULE | ORAL | Status: AC
  Filled 2019-05-31: qty 1

## 2019-05-31 NOTE — Patient Instructions (Signed)
Sodium Ferric Gluconate Complex injection What is this medicine? SODIUM FERRIC GLUCONATE COMPLEX (SOE dee um FER ik GLOO koe nate KOM pleks) is an iron replacement. It is used with epoetin therapy to treat low iron levels in patients who are receiving hemodialysis. This medicine may be used for other purposes; ask your health care provider or pharmacist if you have questions. COMMON BRAND NAME(S): Ferrlecit, Nulecit What should I tell my health care provider before I take this medicine? They need to know if you have any of the following conditions:  anemia that is not from iron deficiency  high levels of iron in the body  an unusual or allergic reaction to iron, benzyl alcohol, other medicines, foods, dyes, or preservatives  pregnant or are trying to become pregnant  breast-feeding How should I use this medicine? This medicine is for infusion into a vein. It is given by a health care professional in a hospital or clinic setting. Talk to your pediatrician regarding the use of this medicine in children. While this drug may be prescribed for children as young as 6 years old for selected conditions, precautions do apply. Overdosage: If you think you have taken too much of this medicine contact a poison control center or emergency room at once. NOTE: This medicine is only for you. Do not share this medicine with others. What if I miss a dose? It is important not to miss your dose. Call your doctor or health care professional if you are unable to keep an appointment. What may interact with this medicine? Do not take this medicine with any of the following medications:  deferoxamine  dimercaprol  other iron products This medicine may also interact with the following medications:  chloramphenicol  deferasirox  medicine for blood pressure like enalapril This list may not describe all possible interactions. Give your health care provider a list of all the medicines, herbs,  non-prescription drugs, or dietary supplements you use. Also tell them if you smoke, drink alcohol, or use illegal drugs. Some items may interact with your medicine. What should I watch for while using this medicine? Your condition will be monitored carefully while you are receiving this medicine. Visit your doctor for check-ups as directed. What side effects may I notice from receiving this medicine? Side effects that you should report to your doctor or health care professional as soon as possible:  allergic reactions like skin rash, itching or hives, swelling of the face, lips, or tongue  breathing problems  changes in hearing  changes in vision  chills, flushing, or sweating  fast, irregular heartbeat  feeling faint or lightheaded, falls  fever, flu-like symptoms  high or low blood pressure  pain, tingling, numbness in the hands or feet  severe pain in the chest, back, flanks, or groin  swelling of the ankles, feet, hands  trouble passing urine or change in the amount of urine  unusually weak or tired Side effects that usually do not require medical attention (report to your doctor or health care professional if they continue or are bothersome):  cramps  dark colored stools  diarrhea  headache  nausea, vomiting  stomach upset This list may not describe all possible side effects. Call your doctor for medical advice about side effects. You may report side effects to FDA at 1-800-FDA-1088. Where should I keep my medicine? This drug is given in a hospital or clinic and will not be stored at home. NOTE: This sheet is a summary. It may not cover all   possible information. If you have questions about this medicine, talk to your doctor, pharmacist, or health care provider.  2020 Elsevier/Gold Standard (2008-06-25 15:58:57)  

## 2019-05-31 NOTE — Progress Notes (Signed)
From Dee--> Dr. Walden Field:  For this pt, they have [place], with this insurance they prefer Venofer or Ferrlecit. Can we use one of these instead? If not, a peer to peer can be done for [facility], which may still get denied.   If it is ok with you to use one of these preferred drugs, please change the order to reflect one of these drugs. Venofer or Ferrlecit.   Pt on sched for 05/31/19. Kennith Center, Pharm.D., CPP 05/29/2019@8 :36 AM

## 2019-06-03 ENCOUNTER — Telehealth: Payer: Self-pay | Admitting: *Deleted

## 2019-06-03 NOTE — Telephone Encounter (Signed)
Gary [name] called to report that he started having hand swelling so much that he "could not make a fist". Became very weak and feels like he cannot take another infusion. Slept all night Friday night and all day Saturday until 3:00pm. All joints felt swollen causing him to have difficulty walking. He felt better on Saturday evening. Swelling has resolved.

## 2019-06-05 ENCOUNTER — Other Ambulatory Visit: Payer: Self-pay | Admitting: Internal Medicine

## 2019-06-05 ENCOUNTER — Telehealth: Payer: Self-pay | Admitting: *Deleted

## 2019-06-05 NOTE — Telephone Encounter (Addendum)
Received call back from patient. He states he is feeling back to his baseline.  He states he started feeling quite bad after he left the [facility] on 05/31/19-had trouble moving, walking, talking, generalized muscle aches/pains.  As stated in earlier call, he felt better by Saturday evening.  He declines to take any more IV  treatments of Ferrlecit.  He had Fereheme 3-4 years ago and did well.  Informed patient that his insurance company would not approve the fereheme. Advised, per Dr. Walden Field, that patient take OTC Slow Fe preparation, 1 daily.  He is due to come back for repeat labs and MD appt in October. Encouraged patient to call back with any questions or concerns.  He voiced understanding. Next 3 IV iron appts have been cancelled at pt request.

## 2019-06-05 NOTE — Telephone Encounter (Signed)
[name] patient regarding his c/o hand swelling and joint pain over this past weekend, weakness. These symptoms occurred after his iron infusion on 05/31/19. No answer but was able to leave message for pt to return call @ [phone]. Options for patient is for him to try OTC Slow FE daily if he does not wish to complete he future iron infusions, next one is scheduled for 06/07/19

## 2019-06-07 ENCOUNTER — Ambulatory Visit: Payer: Medicare Other

## 2019-06-11 ENCOUNTER — Other Ambulatory Visit: Payer: Self-pay | Admitting: Oncology

## 2019-06-11 DIAGNOSIS — D508 Other iron deficiency anemias: Secondary | ICD-10-CM

## 2019-06-12 ENCOUNTER — Telehealth: Payer: Self-pay | Admitting: Oncology

## 2019-06-12 DIAGNOSIS — Z79899 Other long term (current) drug therapy: Secondary | ICD-10-CM | POA: Diagnosis not present

## 2019-06-12 DIAGNOSIS — R899 Unspecified abnormal finding in specimens from other organs, systems and tissues: Secondary | ICD-10-CM | POA: Diagnosis not present

## 2019-06-12 NOTE — Telephone Encounter (Signed)
Noted. Thanks.

## 2019-06-12 NOTE — Telephone Encounter (Signed)
[facility] transfer. Spoke with patient re lab/fu with FS in 3 months. Per patient he has decided to let his primary take care of him. No further appointments scheduled at [facility]. Message routed to [facility].

## 2019-06-14 ENCOUNTER — Ambulatory Visit: Payer: Medicare Other

## 2019-06-21 ENCOUNTER — Ambulatory Visit: Payer: Medicare Other

## 2019-06-24 ENCOUNTER — Other Ambulatory Visit: Payer: Self-pay | Admitting: Internal Medicine

## 2019-06-24 ENCOUNTER — Other Ambulatory Visit: Payer: Self-pay

## 2019-06-24 DIAGNOSIS — M6530 Trigger finger, unspecified finger: Secondary | ICD-10-CM | POA: Diagnosis not present

## 2019-06-24 DIAGNOSIS — M1180 Other specified crystal arthropathies, unspecified site: Secondary | ICD-10-CM | POA: Diagnosis not present

## 2019-06-24 DIAGNOSIS — Z8739 Personal history of other diseases of the musculoskeletal system and connective tissue: Secondary | ICD-10-CM | POA: Diagnosis not present

## 2019-06-24 DIAGNOSIS — L409 Psoriasis, unspecified: Secondary | ICD-10-CM | POA: Diagnosis not present

## 2019-06-24 DIAGNOSIS — R918 Other nonspecific abnormal finding of lung field: Secondary | ICD-10-CM

## 2019-06-24 DIAGNOSIS — Z7952 Long term (current) use of systemic steroids: Secondary | ICD-10-CM | POA: Diagnosis not present

## 2019-06-24 NOTE — Patient Outreach (Signed)
[facility]) Care Management  06/24/2019  Gary [name] [birth year] [id number]   Medication Adherence call to Gary [name] HIPPA Compliant Voice message left with a call back number. Gary [name] is showing past due on Lisinoprl 5 mg under [place].   [place] Management Direct Dial [phone]  Fax [phone] [name]

## 2019-07-02 DIAGNOSIS — D539 Nutritional anemia, unspecified: Secondary | ICD-10-CM | POA: Diagnosis not present

## 2019-07-03 ENCOUNTER — Ambulatory Visit
Admission: RE | Admit: 2019-07-03 | Discharge: 2019-07-03 | Disposition: A | Discharge status: Home / Self Care | Payer: Medicare Other | Source: Ambulatory Visit | Attending: Internal Medicine | Admitting: Internal Medicine

## 2019-07-03 ENCOUNTER — Other Ambulatory Visit: Payer: Self-pay

## 2019-07-03 DIAGNOSIS — R918 Other nonspecific abnormal finding of lung field: Secondary | ICD-10-CM | POA: Diagnosis not present

## 2019-07-04 DIAGNOSIS — R251 Tremor, unspecified: Secondary | ICD-10-CM | POA: Diagnosis not present

## 2019-07-04 DIAGNOSIS — D509 Iron deficiency anemia, unspecified: Secondary | ICD-10-CM | POA: Diagnosis not present

## 2019-07-04 DIAGNOSIS — I2510 Atherosclerotic heart disease of native coronary artery without angina pectoris: Secondary | ICD-10-CM | POA: Diagnosis not present

## 2019-07-04 DIAGNOSIS — Z23 Encounter for immunization: Secondary | ICD-10-CM | POA: Diagnosis not present

## 2019-07-04 DIAGNOSIS — R9389 Abnormal findings on diagnostic imaging of other specified body structures: Secondary | ICD-10-CM | POA: Diagnosis not present

## 2019-07-04 DIAGNOSIS — R2689 Other abnormalities of gait and mobility: Secondary | ICD-10-CM | POA: Diagnosis not present

## 2019-07-09 DIAGNOSIS — A310 Pulmonary mycobacterial infection: Secondary | ICD-10-CM | POA: Diagnosis not present

## 2019-07-16 ENCOUNTER — Other Ambulatory Visit: Payer: Self-pay

## 2019-07-16 NOTE — Patient Outreach (Signed)
[facility]) Care Management  07/16/2019  DAILEY BUCCHERI November 26, 1940 [id number]   Medication Adherence call to Mr. Tayjon Halladay Telephone call to Patient regarding Medication Adherence unable to reach patient. Mr. Capano is showing past due on Lisinopril 5 mg under [place].   [place] Management Direct Dial [phone]  Fax [phone] [email or web address]

## 2019-08-13 ENCOUNTER — Other Ambulatory Visit: Payer: Self-pay

## 2019-08-13 ENCOUNTER — Ambulatory Visit: Payer: Medicare Other | Admitting: Pulmonary Disease

## 2019-08-13 ENCOUNTER — Encounter: Payer: Self-pay | Admitting: Pulmonary Disease

## 2019-08-13 ENCOUNTER — Institutional Professional Consult (permissible substitution): Payer: Medicare Other | Admitting: Pulmonary Disease

## 2019-08-13 DIAGNOSIS — R911 Solitary pulmonary nodule: Secondary | ICD-10-CM | POA: Diagnosis not present

## 2019-08-13 NOTE — Patient Instructions (Signed)
I am not convinced that you have mycobacterial infection but we will monitor you for this.  Please call me if you develop fevers, weight loss or a cough that will not go away.  Check sputum for AFB x1. Repeat CT chest without contrast in 1 year

## 2019-08-13 NOTE — Progress Notes (Signed)
Subjective:    Patient ID: Gary [name], male    DOB: November 15, 1940, 78 y.o.   MRN: [id number]  HPI  78 year old remote smoker presents for evaluation of abnormal CT chest with pulmonary nodules suggestive of mycobacterial infection.  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  Since then he has severe dryness of mouth and occasional dysphagia to solids.  He has been prednisone dependent for gouty arthritis and there is also mention of rheumatoid arthritis in his past medical history.  He underwent CT angiogram chest and 03/2019 to rule out aortic dissection when he presented with abdominal pain and vomiting.  This showed a cluster of nodules in the right upper lobe and an 8 mm nodule in the right lower lobe.  He had a 33-month follow-up scan due to his prior history of smoking and 06/2019 which showed a new cluster of tiny nodules and the previous right lower lobe nodule are resolved.  Some concern was raised for mycobacterial infection.  I note that he was evaluated by [place] in 2014 for similar findings which was felt to be inflammatory.  Of note he denies cough, fevers or sputum production.  He denies weight loss.  He has been in failing health and reports weakness in both his legs.  He has a regular heartbeat for which he is on anticoagulation   Significant tests/ events reviewed   05/2013  Pet scan 1. 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 There are small asymmetric level II lymph nodes on the right which are mildly hypermetabolic and could reflect metastases.  2. No other lesions of the pharyngeal mucosal space identified.  3. Right lung apical nodule and lower lobe air space disease with associated hypermetabolic activity.   CT chest without contrast 06/2019 mild emphysema , cluster of nodules in the posterior right upper lobe new compared to 03/2019 mild scarring in the lower lobes  CT  angiogram chest 03/2019 -cluster of nodules in the right upper lobe, 8 mm nodule right lower lobe   Past Medical History:  Diagnosis Date  . Anemia    "chronic anemic"  . Anginal pain ([place])   . Anxiety   . 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 Cancer ([place])    pre-cancerous lesions removed 11/22/12 and hx of  . Cancer ([place]) 10/01/12   R retromolar trigone, squamous cell  . Chronic back pain    hx of  . Chronic kidney disease    renal insufficiency, [place] kidney  . Coronary artery disease    stent placement 2000, does not see cardiologist at this time  . Dyslipidemia   . Full dentures    Edentulous since age [age]  . GERD (gastroesophageal reflux disease)   . Gout    pseudo gout  . Hearing impairment    job related, hbilat hearing aids  . Hepatitis    hepatitis "C"  . History of radiation therapy 03/11/2013   thru -04/23/2013;  right retromolar trigone/54Gy/32fx  . Hypertension    sees Dr. Deland Pretty  . Hypertension   . Hypothyroid   . Insomnia    cpap  . Iron deficiency anemia 05/24/2019  . Rheumatoid arthritis ([place])    "all over" (09/13/2016)  . Sleep apnea    "tried mask 3 different times; couldn't tolerate it" (09/13/2016)  . Thyroid disease    Hypothyroidism  Past Surgical History:  Procedure Laterality Date  . CARDIAC CATHETERIZATION N/A 09/13/2016   Procedure: Left Heart Cath and Coronary Angiography;  Surgeon: Adrian Prows, MD;  Location: [facility];  Service: Cardiovascular;  Laterality: N/A;  . CARDIAC CATHETERIZATION N/A 10/11/2016   Procedure: Coronary Stent Intervention;  Surgeon: Adrian Prows, MD;  Location: [facility];  Service: Cardiovascular;  Laterality: N/A;  LAD & 1st Diag  . CATARACT EXTRACTION W/ INTRAOCULAR LENS  IMPLANT, BILATERAL Bilateral 2007  . CHOLECYSTECTOMY N/A 03/20/2019   Procedure: LAPAROSCOPIC CHOLECYSTECTOMY;  Surgeon: Clovis Riley, MD;  Location: [facility];  Service: General;  Laterality:  N/A;  . CORONARY ANGIOPLASTY  09/13/2016  . CORONARY ANGIOPLASTY WITH STENT PLACEMENT  2002  . FRACTURE SURGERY    . HIP PINNING,CANNULATED Left 12/22/2012   Procedure: CANNULATED HIP PINNING;  Surgeon: Mcarthur Rossetti, MD;  Location: [facility];  Service: Orthopedics;  Laterality: Left;  [name] MULTIPLE TOOTH EXTRACTIONS  ~ 1962   Edentulous  . RADICAL NECK DISSECTION Right 01/22/2013   [facility]  . SKIN CANCER EXCISION     "face, ears"  . SKIN GRAFT Left ~ 1961   "grafted skin off my stomach & put it on left hand that I had caught in fan belt"  . TONSILLECTOMY      Allergies  Allergen Reactions  . Bee Venom Swelling  . Cellcept [Mycophenolate Mofetil] Other (See Comments)    Reaction unknown  . Colcrys [Colchicine] Other (See Comments)    myalgias  . Erythromycin Other (See Comments)    Reaction unknown  . Methotrexate Derivatives Hives, Itching and Rash    Only in tablet form  . Neosporin [Neomycin-Bacitracin Zn-Polymyx] Rash  . Trazodone Palpitations    Weakness, loss of appetite  . Ultram [Tramadol] Rash   Social History   Socioeconomic History  . Marital status: Widowed    Spouse name: Not on file  . Number of children: 2  . Years of education: Not on file  . Highest education level: Not on file  Occupational History  . Occupation: Retired    Fish farm manager: RETIRED    Comment: Engineer, building services  Social Needs  . Financial resource strain: Not on file  . Food insecurity    Worry: Not on file    Inability: Not on file  . Transportation needs    Medical: Not on file    Non-medical: Not on file  Tobacco Use  . Smoking status: Former Smoker    Packs/day: 1.00    Years: 27.00    Pack years: 27.00    Types: Cigarettes    Quit date: 11/08/1983    Years since quitting: 35.7  . Smokeless tobacco: Never Used  Substance and Sexual Activity  . Alcohol use: Yes    Comment: 09/13/2016 "nothing since 12/2012"  . Drug use: No  . Sexual activity: Not on file  Lifestyle  .  Physical activity    Days per week: Not on file    Minutes per session: Not on file  . Stress: Not on file  Relationships  . Social Herbalist on phone: Not on file    Gets together: Not on file    Attends religious service: Not on file    Active member of club or organization: Not on file    Attends meetings of clubs or organizations: Not on file    Relationship status: Not on file  . Intimate partner violence  Fear of current or ex partner: Not on file    Emotionally abused: Not on file    Physically abused: Not on file    Forced sexual activity: Not on file  Other Topics Concern  . Not on file  Social History Narrative  . Not on file     Family History  Problem Relation Age of Onset  . Coronary artery disease Brother 78  . Heart attack Brother   . Coronary artery disease Father        stents in 70's  . Cancer Mother        bone     Review of Systems Constitutional: negative for anorexia, fevers and sweats  Eyes: negative for irritation, redness and visual disturbance  Ears, nose, mouth, throat, and face: negative for earaches, epistaxis, nasal congestion and sore throat  Respiratory: negative for cough, dyspnea on exertion, sputum and wheezing  Cardiovascular: negative for chest pain, dyspnea, lower extremity edema, orthopnea, palpitations and syncope  Gastrointestinal: negative for abdominal pain, constipation, diarrhea, melena, nausea and vomiting  Genitourinary:negative for dysuria, frequency and hematuria  Hematologic/lymphatic: negative for bleeding, easy bruising and lymphadenopathy  Musculoskeletal:negative for arthralgias, muscle weakness positive for joint stiffness Neurological: negative for coordination problems, gait problems, headaches and weakness  Endocrine: negative for diabetic symptoms including polydipsia, polyuria and weight loss     Objective:   Physical Exam   Gen. Pleasant, well-nourished, in no distress, normal affect,  elderly, ambulates with cane ENT - no pallor,icterus, no post nasal drip Neck: No JVD, no thyromegaly, no carotid bruits Lungs: no use of accessory muscles, no dullness to percussion, clear without rales or rhonchi  Cardiovascular: Rhythm regular, heart sounds  normal, no murmurs or gallops, no peripheral edema Abdomen: soft and non-tender, no hepatosplenomegaly, BS normal. Musculoskeletal: No deformities, no cyanosis or clubbing Neuro:  alert, non focal      Assessment & Plan:

## 2019-08-13 NOTE — Assessment & Plan Note (Signed)
Waxing and waning nodules with mild bronchiectasis  I am not convinced that you have mycobacterial infection but we will monitor you for this.  More likely that this is related to subclinical aspiration given his history of cancer floor of the mouth and some dysphagia Review of his imaging shows very mild changes compared to 2016 -does not need any invasive work-up at this point .  Can consider swallowing study if dysphagia gets worse  Please call me if you develop fevers, weight loss or a cough that will not go away.  Check sputum for AFB x1. Repeat CT chest without contrast in 1 year

## 2019-08-27 DIAGNOSIS — Z5181 Encounter for therapeutic drug level monitoring: Secondary | ICD-10-CM | POA: Diagnosis not present

## 2019-08-27 DIAGNOSIS — Z79899 Other long term (current) drug therapy: Secondary | ICD-10-CM | POA: Diagnosis not present

## 2019-08-27 DIAGNOSIS — D509 Iron deficiency anemia, unspecified: Secondary | ICD-10-CM | POA: Diagnosis not present

## 2019-08-28 DIAGNOSIS — N183 Chronic kidney disease, stage 3 unspecified: Secondary | ICD-10-CM | POA: Diagnosis not present

## 2019-08-28 DIAGNOSIS — E785 Hyperlipidemia, unspecified: Secondary | ICD-10-CM | POA: Diagnosis not present

## 2019-08-28 DIAGNOSIS — I129 Hypertensive chronic kidney disease with stage 1 through stage 4 chronic kidney disease, or unspecified chronic kidney disease: Secondary | ICD-10-CM | POA: Diagnosis not present

## 2019-08-28 DIAGNOSIS — N2581 Secondary hyperparathyroidism of renal origin: Secondary | ICD-10-CM | POA: Diagnosis not present

## 2019-08-28 DIAGNOSIS — D631 Anemia in chronic kidney disease: Secondary | ICD-10-CM | POA: Diagnosis not present

## 2019-08-29 DIAGNOSIS — R911 Solitary pulmonary nodule: Secondary | ICD-10-CM | POA: Diagnosis not present

## 2019-08-29 DIAGNOSIS — D509 Iron deficiency anemia, unspecified: Secondary | ICD-10-CM | POA: Diagnosis not present

## 2019-08-29 DIAGNOSIS — R251 Tremor, unspecified: Secondary | ICD-10-CM | POA: Diagnosis not present

## 2019-08-29 DIAGNOSIS — I4891 Unspecified atrial fibrillation: Secondary | ICD-10-CM | POA: Diagnosis not present

## 2019-08-30 ENCOUNTER — Telehealth: Payer: Self-pay | Admitting: Pulmonary Disease

## 2019-08-30 NOTE — Telephone Encounter (Signed)
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 We will continue to follow him for symptoms. Ensure follow-up office visit in 3 months

## 2019-09-02 DIAGNOSIS — D649 Anemia, unspecified: Secondary | ICD-10-CM | POA: Diagnosis not present

## 2019-09-03 DIAGNOSIS — M545 Low back pain: Secondary | ICD-10-CM | POA: Diagnosis not present

## 2019-09-06 NOTE — Telephone Encounter (Signed)
ATC pt, no answer. Left message for pt to call back.  

## 2019-09-10 DIAGNOSIS — H40013 Open angle with borderline findings, low risk, bilateral: Secondary | ICD-10-CM | POA: Diagnosis not present

## 2019-09-10 DIAGNOSIS — H30033 Focal chorioretinal inflammation, peripheral, bilateral: Secondary | ICD-10-CM | POA: Diagnosis not present

## 2019-09-10 DIAGNOSIS — H3581 Retinal edema: Secondary | ICD-10-CM | POA: Diagnosis not present

## 2019-09-10 DIAGNOSIS — Z961 Presence of intraocular lens: Secondary | ICD-10-CM | POA: Diagnosis not present

## 2019-10-24 DIAGNOSIS — M1180 Other specified crystal arthropathies, unspecified site: Secondary | ICD-10-CM | POA: Diagnosis not present

## 2019-10-24 DIAGNOSIS — M6530 Trigger finger, unspecified finger: Secondary | ICD-10-CM | POA: Diagnosis not present

## 2019-10-24 DIAGNOSIS — Z8739 Personal history of other diseases of the musculoskeletal system and connective tissue: Secondary | ICD-10-CM | POA: Diagnosis not present

## 2019-10-24 DIAGNOSIS — Z7952 Long term (current) use of systemic steroids: Secondary | ICD-10-CM | POA: Diagnosis not present

## 2019-10-24 DIAGNOSIS — L409 Psoriasis, unspecified: Secondary | ICD-10-CM | POA: Diagnosis not present

## 2019-11-26 DIAGNOSIS — L570 Actinic keratosis: Secondary | ICD-10-CM | POA: Diagnosis not present

## 2019-11-26 DIAGNOSIS — L812 Freckles: Secondary | ICD-10-CM | POA: Diagnosis not present

## 2019-11-26 DIAGNOSIS — Z85828 Personal history of other malignant neoplasm of skin: Secondary | ICD-10-CM | POA: Diagnosis not present

## 2019-11-26 DIAGNOSIS — L218 Other seborrheic dermatitis: Secondary | ICD-10-CM | POA: Diagnosis not present

## 2019-11-26 DIAGNOSIS — L821 Other seborrheic keratosis: Secondary | ICD-10-CM | POA: Diagnosis not present

## 2019-12-03 DIAGNOSIS — H3581 Retinal edema: Secondary | ICD-10-CM | POA: Diagnosis not present

## 2019-12-03 DIAGNOSIS — Z961 Presence of intraocular lens: Secondary | ICD-10-CM | POA: Diagnosis not present

## 2019-12-03 DIAGNOSIS — H40013 Open angle with borderline findings, low risk, bilateral: Secondary | ICD-10-CM | POA: Diagnosis not present

## 2019-12-03 DIAGNOSIS — H30033 Focal chorioretinal inflammation, peripheral, bilateral: Secondary | ICD-10-CM | POA: Diagnosis not present

## 2019-12-05 NOTE — Progress Notes (Signed)
[facility] Note  12/09/2019     CHIEF COMPLAINT Patient presents for Retina Evaluation   HISTORY OF PRESENT ILLNESS: Gary [name] is a 79 y.o. male who presents to the clinic today for:   HPI    Retina Evaluation    In both eyes.  I, the attending physician,  performed the HPI with the patient and updated documentation appropriately.          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 Silvestre Gunner with [facility] at this time and received injections from Dr. Silvestre Gunner OU, once a week? Hx of Hepatitis C from 1970  Patient states his vision is worse in his left eye but has had no problems thus far with his visual acuity.  Patient denies eye pain.  Patient has occasional white floating spots in both eyes and occasional black floater OU.  Patient denies flashes of light.       Last edited by Bernarda Caffey, MD on 12/09/2019  1:20 PM. (History)    pt is here for a FA per Dr. Manuella Ghazi, pt states he has been a pt of Dr. Manuella Ghazi since Dr. Silvestre Gunner left [place], pt states he usually sees Dr. Manuella Ghazi in [place], pt states he has had cataract sx (surgeon unknown), but no other eye sx, pt states both Drs. 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 Silvestre Gunner used to give him injections in his eyes, he states Dr. Silvestre Gunner cleared his vision up, but Dr. Manuella Ghazi think the retinopathy may be coming back, pt states he feels like his vision may be declining  Referring physician: Feliz Beam, MD [place],  [place] [id number]  HISTORICAL INFORMATION:   Selected notes from the MEDICAL RECORD NUMBER    CURRENT MEDICATIONS: Current Outpatient Medications (Ophthalmic Drugs)  Medication Sig  . Polyethyl Glycol-Propyl Glycol (SYSTANE) 0.4-0.3 % GEL ophthalmic gel Place 1 application into both eyes as needed (dry eyes).   No current  facility-administered medications for this visit. (Ophthalmic Drugs)   Current Outpatient Medications (Other)  Medication Sig  . acyclovir (ZOVIRAX) 400 MG tablet Take 400 mg by mouth 2 (two) times daily.   [name] atorvastatin (LIPITOR) 40 MG tablet Take 40 mg by mouth 2 (two) times daily.   . Calcium Carbonate-Vitamin D (CALCIUM-D) 600-400 MG-UNIT TABS Take 1 tablet by mouth daily.   . Cholecalciferol (VITAMIN D3) 50 MCG (2000 UT) capsule Take 2,000 Units by mouth daily.  . clobetasol (TEMOVATE) 0.05 % external solution Apply 1 application topically as needed. 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 diazepam (VALIUM) 5 MG tablet Take 1 tablet (5 mg total) by mouth every 12 (twelve) hours as needed for anxiety. For anxiety (Patient taking differently: Take 5 mg by mouth at bedtime as needed (sleep). For anxiety)  . EPINEPHrine 0.3 mg/0.3 mL IJ SOAJ injection Inject 0.3 mg into the muscle as directed.   . ferrous sulfate 220 (44 Fe) MG/5ML solution   . Glucosamine 500 MG CAPS Take 1 capsule by mouth daily.  [name] lactose free nutrition (BOOST) LIQD Take 237 mLs by mouth 3 (three) times daily between meals. As needed  . levothyroxine (SYNTHROID, LEVOTHROID) 112 MCG tablet Take 112 mcg daily before breakfast by mouth.  [name] lisinopril (ZESTRIL) 5 MG tablet Take 5 mg by mouth daily.  [name]  magnesium oxide (MAG-OX) 400 MG tablet Take 400 mg by mouth at bedtime.  . meclizine (ANTIVERT) 25 MG tablet Take 25 mg by mouth every 12 (twelve) hours as needed for dizziness.   . mirtazapine (REMERON) 15 MG tablet Take 7.5 mg by mouth at bedtime.   . Multiple Vitamin (MULTIVITAMIN WITH MINERALS) TABS tablet Take 1 tablet by mouth daily.  . nitroGLYCERIN (NITROSTAT) 0.4 MG SL tablet TAKE 1 TABLET SUBLINGUALLY EVERY 5 MINUTES AS NEEDED FOR CHEST PAIN UP TO 3 DOSES THEN CALL 911  . Omega-3 1000 MG CAPS Take 2 capsules by mouth daily.  . pantoprazole (PROTONIX) 40 MG tablet Take 40 mg by mouth 2 (two) times daily.  . polyethylene glycol (MIRALAX /  GLYCOLAX) 17 g packet Take 17 g by mouth daily.  . predniSONE (DELTASONE) 5 MG tablet Take 5 mg by mouth at bedtime.   . predniSONE (DELTASONE) 5 MG tablet Take 1 tablet by mouth daily.  . propranolol (INDERAL) 10 MG tablet Take 10 mg by mouth 2 (two) times daily.  . sertraline (ZOLOFT) 100 MG tablet Take 100 mg by mouth at bedtime.   Alveda Reasons 10 MG TABS tablet Take 10 mg by mouth daily.   No current facility-administered medications for this visit. (Other)      REVIEW OF SYSTEMS: ROS    Positive for: Eyes   Negative for: Constitutional, Gastrointestinal, Neurological, Skin, Genitourinary, Musculoskeletal, HENT, Endocrine, Cardiovascular, Respiratory, Psychiatric, Allergic/Imm, Heme/Lymph   Last edited by Doneen Poisson on 12/09/2019  1:12 PM. (History)       ALLERGIES Allergies  Allergen Reactions  . Bee Venom Swelling  . Cellcept [Mycophenolate Mofetil] Other (See Comments)    Reaction unknown  . Colcrys [Colchicine] Other (See Comments)    myalgias  . Erythromycin Other (See Comments)    Reaction unknown  . Methotrexate Derivatives Hives, Itching and Rash    Only in tablet form  . Neosporin [Neomycin-Bacitracin Zn-Polymyx] Rash  . Trazodone Palpitations    Weakness, loss of appetite  . Ultram [Tramadol] Rash    PAST MEDICAL HISTORY Past Medical History:  Diagnosis Date  . Anemia    "chronic anemic"  . Anginal pain ([place])   . Anxiety   . 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 Cancer ([place])    pre-cancerous lesions removed 11/22/12 and hx of  . 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 Chronic back pain    hx of  . Chronic kidney disease    renal insufficiency, [place] [facility]  . Coronary artery disease    stent placement 2000, does not see cardiologist at this time  . Dyslipidemia   . Full dentures    Edentulous since age 49/20  . GERD (gastroesophageal reflux disease)   . Gout    pseudo gout  . Hearing  impairment    job related, hbilat hearing aids  . Hepatitis    hepatitis "C"  . History of radiation therapy 03/11/2013   thru -04/23/2013;  right retromolar trigone/54Gy/70fx  . Hypertension    sees Dr. Deland Pretty  . Hypertension   . Hypothyroid   . Insomnia    cpap  . Iron deficiency anemia 05/24/2019  . Rheumatoid arthritis ([place])    "all over" (09/13/2016)  . Sleep apnea    "tried mask 3 different times; couldn't tolerate it" (09/13/2016)  . Thyroid disease    Hypothyroidism   Past Surgical History:  Procedure Laterality Date  . CARDIAC CATHETERIZATION N/A 09/13/2016   Procedure: Left Heart Cath and Coronary Angiography;  Surgeon: Adrian Prows, MD;  Location: [facility];  Service: Cardiovascular;  Laterality: N/A;  . CARDIAC CATHETERIZATION N/A 10/11/2016   Procedure: Coronary Stent Intervention;  Surgeon: Adrian Prows, MD;  Location: [facility];  Service: Cardiovascular;  Laterality: N/A;  LAD & 1st Diag  . CATARACT EXTRACTION W/ INTRAOCULAR LENS  IMPLANT, BILATERAL Bilateral 2007  . CHOLECYSTECTOMY N/A 03/20/2019   Procedure: LAPAROSCOPIC CHOLECYSTECTOMY;  Surgeon: Clovis Riley, MD;  Location: [facility];  Service: General;  Laterality: N/A;  . CORONARY ANGIOPLASTY  09/13/2016  . CORONARY ANGIOPLASTY WITH STENT PLACEMENT  2002  . FRACTURE SURGERY    . HIP PINNING,CANNULATED Left 12/22/2012   Procedure: CANNULATED HIP PINNING;  Surgeon: Mcarthur Rossetti, MD;  Location: [facility];  Service: Orthopedics;  Laterality: Left;  [name] MULTIPLE TOOTH EXTRACTIONS  ~ 1962   Edentulous  . RADICAL NECK DISSECTION Right 01/22/2013   [facility]  . SKIN CANCER EXCISION     "face, ears"  . SKIN GRAFT Left ~ 1961   "grafted skin off my stomach & put it on left hand that I had caught in fan belt"  . TONSILLECTOMY      FAMILY HISTORY Family History  Problem Relation Age of Onset  . Coronary artery disease Brother 32  . Heart attack Brother   . Coronary artery disease Father         stents in 70's  . Cancer Mother        bone    SOCIAL HISTORY Social History   Tobacco Use  . Smoking status: Former Smoker    Packs/day: 1.00    Years: 27.00    Pack years: 27.00    Types: Cigarettes    Quit date: 11/08/1983    Years since quitting: 36.1  . Smokeless tobacco: Never Used  Substance Use Topics  . Alcohol use: Yes    Comment: 09/13/2016 "nothing since 12/2012"  . Drug use: No         OPHTHALMIC EXAM:  Base Eye Exam    Visual Acuity (Snellen - Linear)      Right Left   Dist cc 20/25 -2 20/30 -1   Dist ph cc NI NI   Correction: Glasses       Tonometry (Tonopen, 1:15 PM)      Right Left   Pressure 12 13       Pupils      Dark Light Shape React APD   Right 2 1 Round Minimal 0   Left 2 1 Round Minimal 0       Visual Fields (Counting fingers)      Left Right    Full Full       Extraocular Movement      Right Left    Full Full       Neuro/Psych    Oriented x3: Yes   Mood/Affect: Normal       Dilation    Both eyes: 1.0% Mydriacyl, 2.5% Phenylephrine @ 1:15 PM        Slit Lamp and Fundus Exam    Slit Lamp Exam      Right Left   Lids/Lashes Dermatochalasis - upper lid Dermatochalasis - upper lid   Conjunctiva/Sclera White and quiet White and quiet   Cornea Mild Arcus, well healed temporal cataract wounds Mild Arcus, well healed temporal cataract wounds, Debris in  tear film   Anterior Chamber Deep and quiet, trace cell, no flare Deep and quiet, trace cell, no flare   Iris Round and dilated Round and dilated   Lens 3 piece PC IOL in good position 3 piece PC IOL in good position   Vitreous Vitreous syneresis, +Cell, Posterior vitreous detachment Vitreous syneresis, +Cell, Posterior vitreous detachment       Fundus Exam      Right Left   Disc Pallor, Sharp rim, +cupping, Peripapillary atrophy Pallor, thin inferior rim, +cupping, Peripapillary atrophy   C/D Ratio 0.7 0.8   Macula Flat, Blunted foveal reflex, Retinal pigment epithelial  mottling, trace Epiretinal membrane, No heme or edema, focal creamy CR temporal macula Flat, Blunted foveal reflex, Retinal pigment epithelial mottling, trace Epiretinal membrane, No heme or edema   Vessels Vascular attenuation Vascular attenuation   Periphery Attached; creamy birdshot lesions Attached, creamy birdshot lesions; focal pigmented CR scarring inf nasal periphery        Refraction    Wearing Rx      Sphere Cylinder Axis   Right -1.50 +0.75 040   Left -0.50 Sphere        Manifest Refraction      Sphere Cylinder Dist VA   Right -1.50 Sphere 20/25-1   Left +0.00 Sphere 20/30+2          IMAGING AND PROCEDURES  Imaging and Procedures for @TODAY @  OCT, Retina - OU - Both Eyes       Right Eye Quality was good. Central Foveal Thickness: 218. Progression has no prior data. Findings include normal foveal contour, no IRF, no SRF, outer retinal atrophy, inner retinal atrophy (Diffuse retinal thinning with focal areas of ORA).   Left Eye Quality was good. Central Foveal Thickness: 217. Progression has no prior data. Findings include normal foveal contour, no IRF, no SRF, inner retinal atrophy, outer retinal atrophy (Blunted foveal profile, diffuse retinal thinning, patchy ORA).   Notes *Images captured and stored on drive  Diagnosis / Impression:  Diffuse retinal thinning/atrophy OU Patchy ORA OU No CME/IRF/SRF OU  Clinical management:  See below  Abbreviations: NFP - Normal foveal profile. CME - cystoid macular edema. PED - pigment epithelial detachment. IRF - intraretinal fluid. SRF - subretinal fluid. EZ - ellipsoid zone. ERM - epiretinal membrane. ORA - outer retinal atrophy. ORT - outer retinal tubulation. SRHM - subretinal hyper-reflective material        Fluorescein Angiography Optos (Transit OS)       Right Eye   Progression has no prior data. Early phase findings include staining. Mid/Late phase findings include staining (Hyperfluorescence of birdshot  lesions -- greatest inferior and nasal midzone).   Left Eye   Progression has no prior data. Early phase findings include vascular perfusion defect, delayed filling, staining. Mid/Late phase findings include staining.   Notes **Images stored on drive**  Impression: Birdshot chorioretinopathy OU No angiographic CME OD: late hyperfluorescence of birdshot lesions -- greatest inferior and nasal midzone OS: patchy choroidal filling; mildly delayed retinal filling time; late hyperfluorescence of birdshot lesions                  ASSESSMENT/PLAN:    ICD-10-CM   1. Focal choroiditis and chorioretinitis, peripheral, bilateral  H30.033   2. 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 Retinal edema  H35.81 OCT, Retina - OU - Both Eyes  4. Essential hypertension  I10   5. Hypertensive retinopathy of both eyes  H35.033 Fluorescein Angiography Optos (Transit OS)  6. Pseudophakia of both eyes  Z96.1     1. Focal, peripheral choroiditis and chorioretinitis / Birdshot chorioretinopathy OU  - pt of Dr. Manuella Ghazi, Retina/Uveitis expert, inherited from Dr. Silvestre Gunner  - history of multiple Ozurdex injections OU by Dr. Silvestre Gunner -- last being 2013  - referred here for Optos FA  - BCVA 20/25 OD, 20/30 OS  - exam shows focal creamy birdshot lesions OU; no retinal edema or CNVM  - OCT shows diffuse atrophy OU  - FA with late hyperfluorescence of birdshot lesions OU  - will share images with Dr. Manuella Ghazi for review  - f/u here prn per Dr. Manuella Ghazi  3. No retinal edema on exam or OCT  4,5. Hypertensive retinopathy OU  - discussed importance of tight BP control  - monitor  6. Pseudophakia OU  - s/p CE/IOL (2007, surgeon unknown)  - IOLs in excellent posiiton  - monitor    Ophthalmic Meds Ordered this visit:  No orders of the defined types were placed in this encounter.      Return if symptoms worsen or fail to improve.  There are no Patient Instructions on file for this visit.  This document serves  as a record of services personally performed by Gardiner Sleeper, MD, PhD. It was created on their behalf by Leeann Must, [place], a certified ophthalmic assistant. The creation of this record is the provider's dictation and/or activities during the visit.    Electronically signed by: Leeann Must, COA @TODAY @ 4:28 PM   This document serves as a record of services personally performed by Gardiner Sleeper, MD, PhD. It was created on their behalf by Ernest Mallick, OA, an ophthalmic assistant. The creation of this record is the provider's dictation and/or activities during the visit.    Electronically signed by: Ernest Mallick, OA 02.01.2021 4:28 PM   Explained the diagnoses, plan, and follow up with the patient and they expressed understanding.  Patient expressed understanding of the importance of proper follow up care.   Gardiner Sleeper, M.D., Ph.D. Diseases & Surgery of the Retina and Vitreous [facility]  I have reviewed the above documentation for accuracy and completeness, and I agree with the above. Gardiner Sleeper, M.D., Ph.D. 12/09/19 4:28 PM   Abbreviations: M myopia (nearsighted); A astigmatism; H hyperopia (farsighted); P presbyopia; Mrx spectacle prescription;  CTL contact lenses; OD right eye; OS left eye; OU both eyes  XT exotropia; ET esotropia; PEK punctate epithelial keratitis; PEE punctate epithelial erosions; DES dry eye syndrome; MGD meibomian gland dysfunction; ATs artificial tears; PFAT's preservative free artificial tears; [place] nuclear sclerotic cataract; PSC posterior subcapsular cataract; ERM epi-retinal membrane; PVD posterior vitreous detachment; RD retinal detachment; DM diabetes mellitus; DR diabetic retinopathy; NPDR non-proliferative diabetic retinopathy; PDR proliferative diabetic retinopathy; CSME clinically significant macular edema; DME diabetic macular edema; dbh dot blot hemorrhages; CWS cotton wool spot; POAG primary open angle glaucoma; C/D  cup-to-disc ratio; HVF humphrey visual field; GVF goldmann visual field; OCT optical coherence tomography; IOP intraocular pressure; BRVO Branch retinal vein occlusion; CRVO central retinal vein occlusion; CRAO central retinal artery occlusion; BRAO branch retinal artery occlusion; RT retinal tear; SB scleral buckle; PPV pars plana vitrectomy; VH Vitreous hemorrhage; PRP panretinal laser photocoagulation; IVK intravitreal kenalog; VMT vitreomacular traction; MH Macular hole;  NVD neovascularization of the disc; NVE neovascularization elsewhere; AREDS age related eye disease study; ARMD age related macular degeneration; POAG primary open angle glaucoma; EBMD epithelial/anterior basement membrane dystrophy; ACIOL anterior chamber intraocular  lens; IOL intraocular lens; PCIOL posterior chamber intraocular lens; Phaco/IOL phacoemulsification with intraocular lens placement; [place] photorefractive keratectomy; LASIK laser assisted in situ keratomileusis; HTN hypertension; DM diabetes mellitus; COPD chronic obstructive pulmonary disease

## 2019-12-09 ENCOUNTER — Ambulatory Visit (INDEPENDENT_AMBULATORY_CARE_PROVIDER_SITE_OTHER): Payer: Medicare Other | Admitting: Ophthalmology

## 2019-12-09 ENCOUNTER — Other Ambulatory Visit: Payer: Self-pay

## 2019-12-09 ENCOUNTER — Encounter (INDEPENDENT_AMBULATORY_CARE_PROVIDER_SITE_OTHER): Payer: Self-pay | Admitting: Ophthalmology

## 2019-12-09 DIAGNOSIS — H35033 Hypertensive retinopathy, bilateral: Secondary | ICD-10-CM

## 2019-12-09 DIAGNOSIS — Z961 Presence of intraocular lens: Secondary | ICD-10-CM

## 2019-12-09 DIAGNOSIS — H3581 Retinal edema: Secondary | ICD-10-CM | POA: Diagnosis not present

## 2019-12-09 DIAGNOSIS — H3090 Unspecified chorioretinal inflammation, unspecified eye: Secondary | ICD-10-CM | POA: Diagnosis not present

## 2019-12-09 DIAGNOSIS — I10 Essential (primary) hypertension: Secondary | ICD-10-CM | POA: Diagnosis not present

## 2019-12-09 DIAGNOSIS — H30033 Focal chorioretinal inflammation, peripheral, bilateral: Secondary | ICD-10-CM | POA: Diagnosis not present

## 2019-12-27 DIAGNOSIS — Z79899 Other long term (current) drug therapy: Secondary | ICD-10-CM | POA: Diagnosis not present

## 2019-12-27 DIAGNOSIS — M545 Low back pain: Secondary | ICD-10-CM | POA: Diagnosis not present

## 2019-12-27 DIAGNOSIS — G894 Chronic pain syndrome: Secondary | ICD-10-CM | POA: Diagnosis not present

## 2019-12-27 DIAGNOSIS — Z79891 Long term (current) use of opiate analgesic: Secondary | ICD-10-CM | POA: Diagnosis not present

## 2019-12-31 DIAGNOSIS — H3581 Retinal edema: Secondary | ICD-10-CM | POA: Diagnosis not present

## 2019-12-31 DIAGNOSIS — H30033 Focal chorioretinal inflammation, peripheral, bilateral: Secondary | ICD-10-CM | POA: Diagnosis not present

## 2019-12-31 DIAGNOSIS — Z961 Presence of intraocular lens: Secondary | ICD-10-CM | POA: Diagnosis not present

## 2019-12-31 DIAGNOSIS — H40013 Open angle with borderline findings, low risk, bilateral: Secondary | ICD-10-CM | POA: Diagnosis not present

## 2020-03-10 DIAGNOSIS — D631 Anemia in chronic kidney disease: Secondary | ICD-10-CM | POA: Diagnosis not present

## 2020-03-10 DIAGNOSIS — N183 Chronic kidney disease, stage 3 unspecified: Secondary | ICD-10-CM | POA: Diagnosis not present

## 2020-03-10 DIAGNOSIS — I129 Hypertensive chronic kidney disease with stage 1 through stage 4 chronic kidney disease, or unspecified chronic kidney disease: Secondary | ICD-10-CM | POA: Diagnosis not present

## 2020-03-10 DIAGNOSIS — N2581 Secondary hyperparathyroidism of renal origin: Secondary | ICD-10-CM | POA: Diagnosis not present

## 2020-03-10 DIAGNOSIS — E785 Hyperlipidemia, unspecified: Secondary | ICD-10-CM | POA: Diagnosis not present

## 2020-03-10 DIAGNOSIS — N189 Chronic kidney disease, unspecified: Secondary | ICD-10-CM | POA: Diagnosis not present

## 2020-04-03 IMAGING — DX PORTABLE CHEST - 1 VIEW
1 series · 1 of 1 positions shown · non-contrast
Comparison: Chest CT 04/21/2015

CLINICAL DATA: Nausea and vomiting with chest pain

EXAM:
PORTABLE CHEST 1 VIEW

[chest ap]
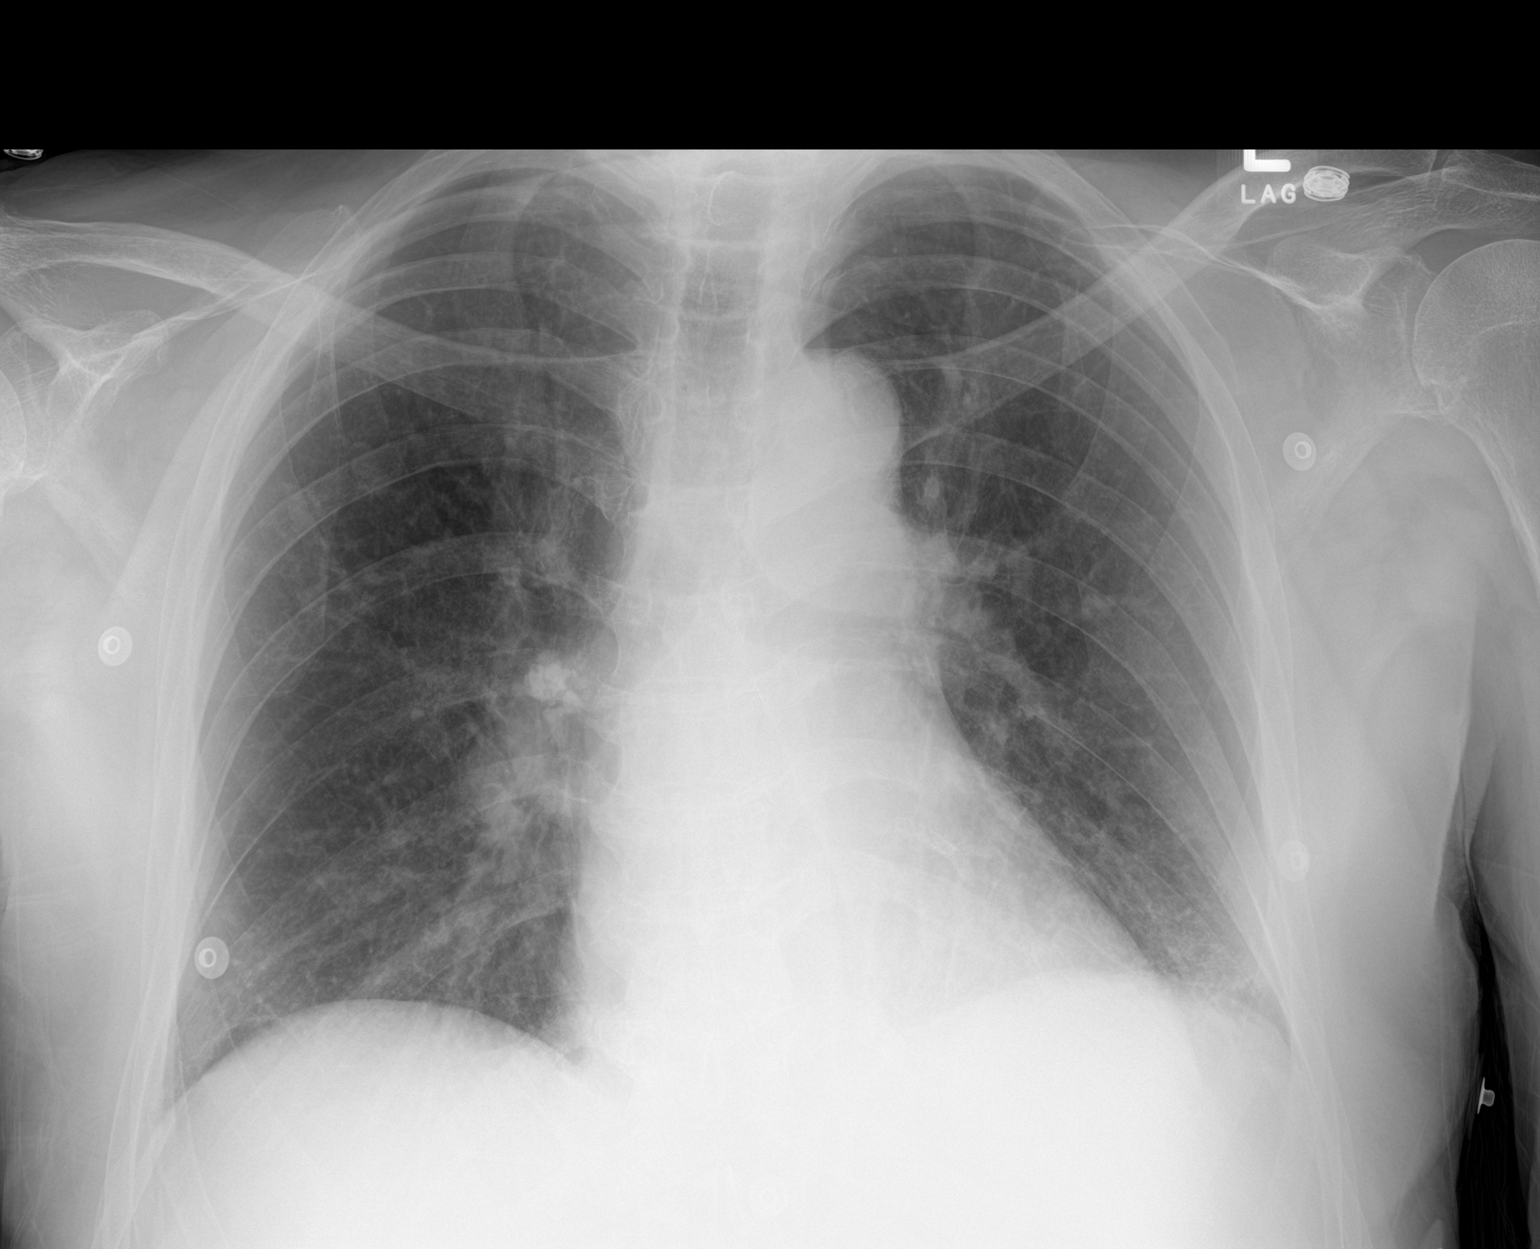

[1 of 1 positions shown; findings below may reference images not displayed]

FINDINGS: The heart size and mediastinal contours are within normal limits.
Both lungs are clear. The visualized skeletal structures are
unremarkable.
IMPRESSION: No active disease.

## 2020-04-03 IMAGING — NM NUCLEAR MEDICINE HEPATOBILIARY IMAGING WITH GALLBLADDER EF
2 series · 12 of 12 positions shown · non-contrast
Comparison: CT 03/19/2019, ultrasound 03/19/2019

CLINICAL DATA: RIGHT upper quadrant pain. Cholelithiasis on
ultrasound. Pericholecystic fluid on CT examination.

EXAM:
NUCLEAR MEDICINE HEPATOBILIARY IMAGING
TECHNIQUE: Sequential images of the abdomen were obtained [DATE] minutes
following intravenous administration of radiopharmaceutical. 3 mg IV
morphine
RADIOPHARMACEUTICALS:  5.5 mCi Ac-AAm Choletec IV (booster dose of
1.1 millicurie Choletec administered after 90 minutes)

[Series 1: raw data · 4.46mm/px · 6 of 29 frames shown (1 of 2)]
[frame 3/29]
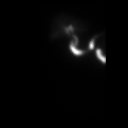
[frame 7/29]
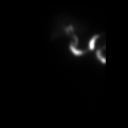
[frame 12/29]
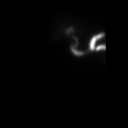
[frame 17/29]
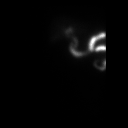
[frame 22/29]
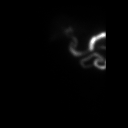
[frame 27/29]
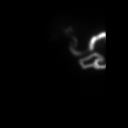

[Series 1: raw data · 4.46mm/px · 6 of 60 frames shown (2 of 2)]
[frame 6/60]
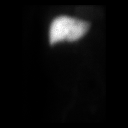
[frame 16/60]
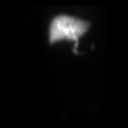
[frame 26/60]
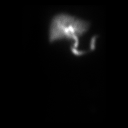
[frame 36/60]
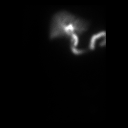
[frame 46/60]
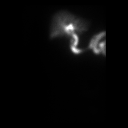
[frame 56/60]
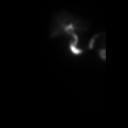

[12 of 12 positions shown; findings below may reference images not displayed]

FINDINGS: There is prompt clearance radiotracer from the blood pool and
homogeneous uptake in the liver. Counts are evident in the common
bile duct and small bowel by 30 minutes. The gallbladder is not
visualized at 90 minutes. At 90 minutes, IV morphine was
administered to contract the sphincter of Oddi. 30 minutes of
imaging following IV administration of morphine fail to demonstrate
filling of the gallbladder.
IMPRESSION: Non filling of the gallbladder indicates obstruction of the cystic
duct. Findings consistent with ACUTE CHOLECYSTITIS.

These results will be called to the ordering clinician or
representative by the Radiologist Assistant, and communication
documented in the PACS or zVision Dashboard.

## 2020-04-03 IMAGING — US ULTRASOUND ABDOMEN LIMITED
1 series · 14 of 25 positions shown · non-contrast
Comparison: CTA Chest, Abdomen, and Pelvis earlier today.

CLINICAL DATA: 78-year-old male with right upper quadrant pain and
suspicion of cholelithiasis and cholecystitis on CTA Chest, Abdomen,
and Pelvis earlier today

EXAM:
ULTRASOUND ABDOMEN LIMITED RIGHT UPPER QUADRANT

[Series 1: ultrasound abdomen limited · 14 of 50 slices shown]
[im 1/50]
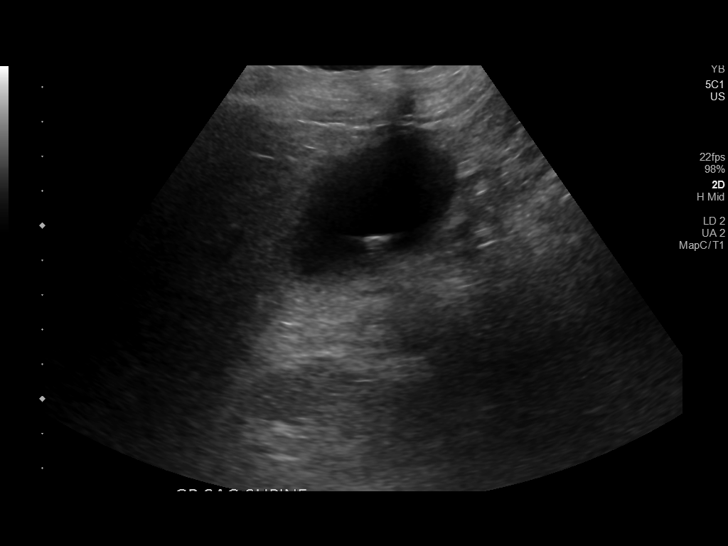
[im 5/50]
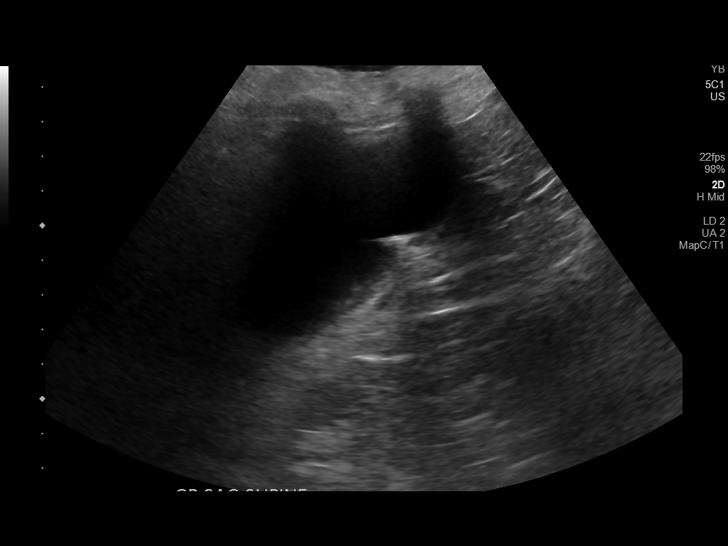
[im 9/50]
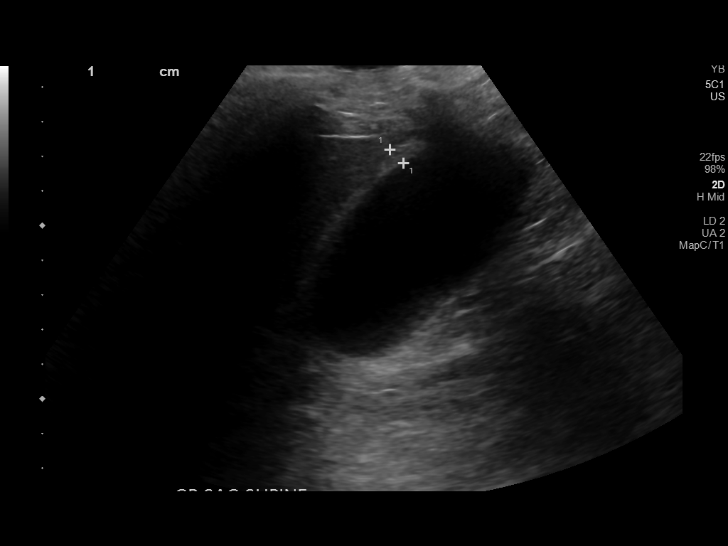
[im 13/50]
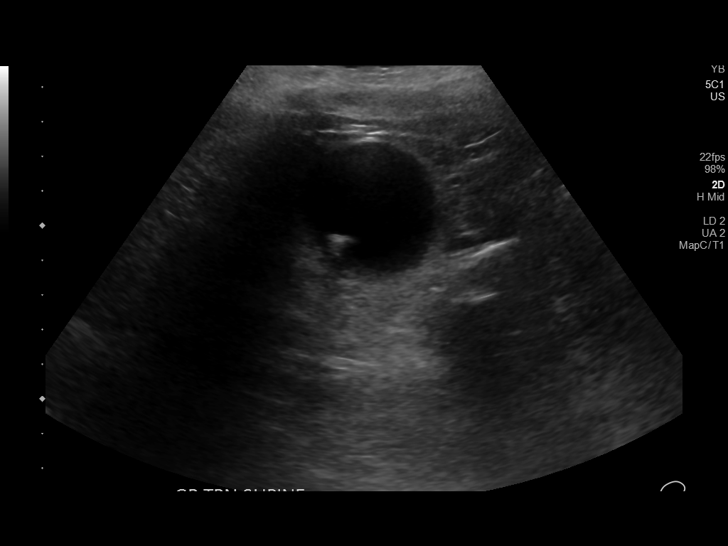
[im 17/50]
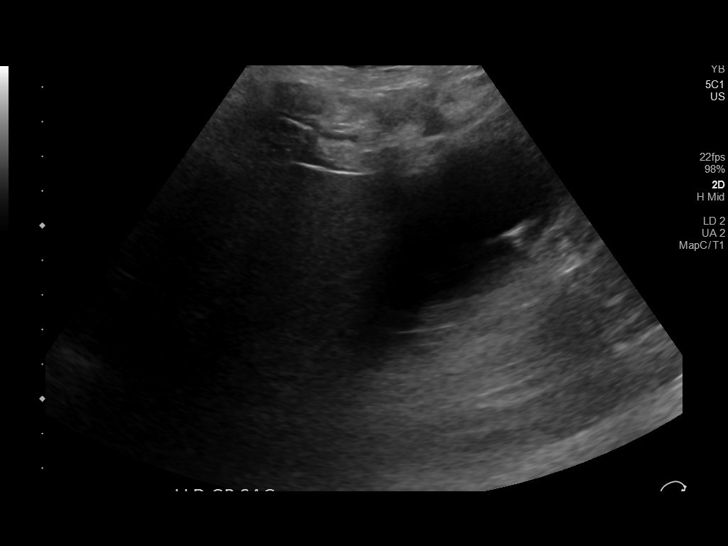
[im 19/50]
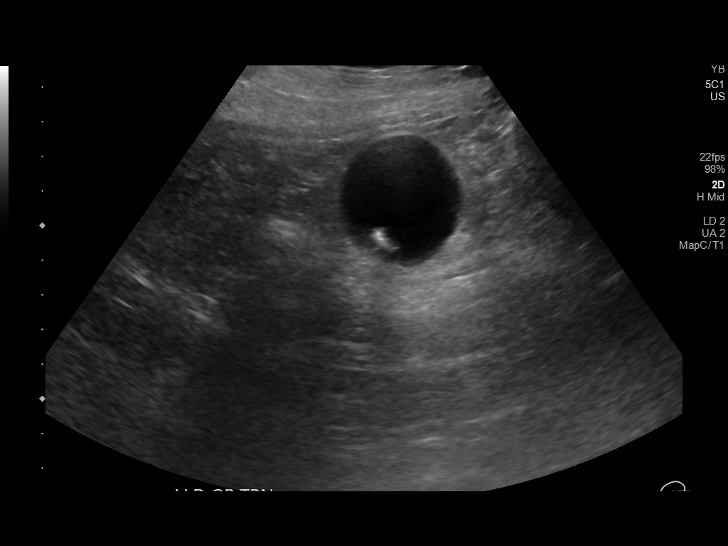
[im 23/50]
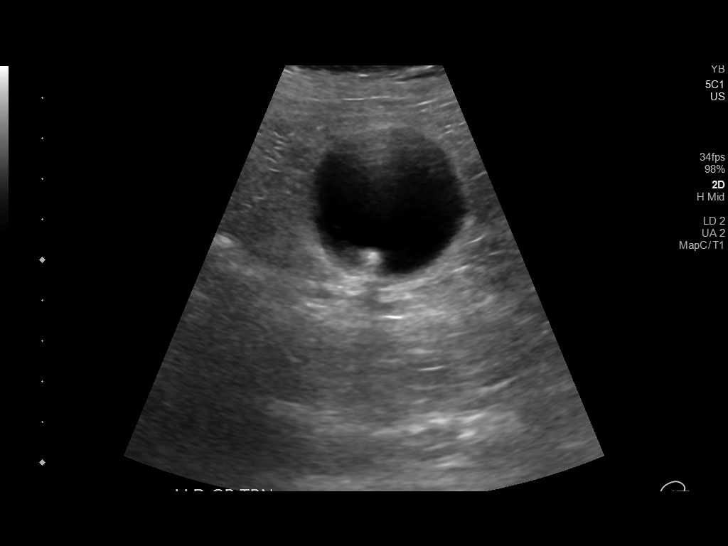
[im 27/50]
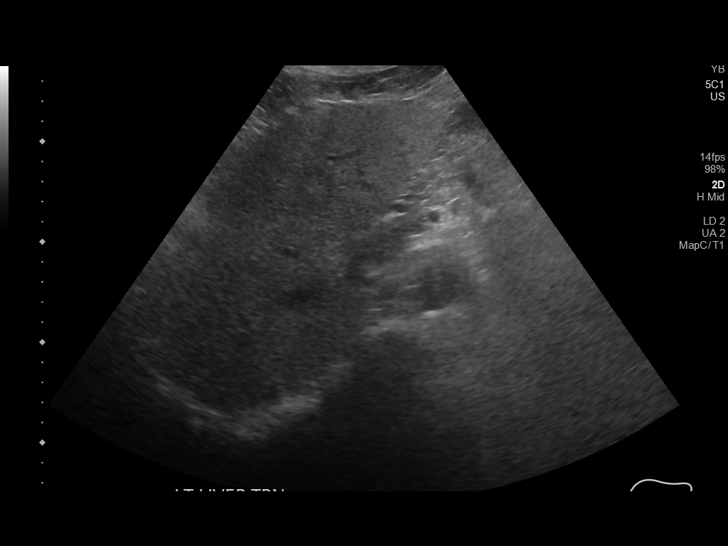
[im 31/50]
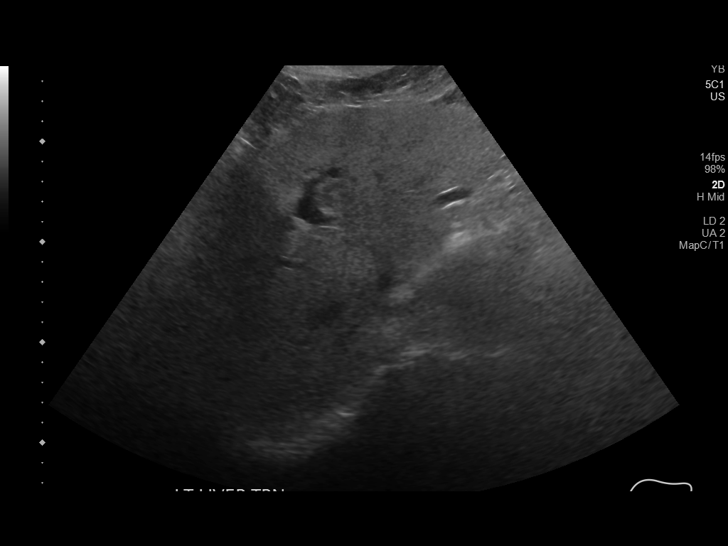
[im 33/50]
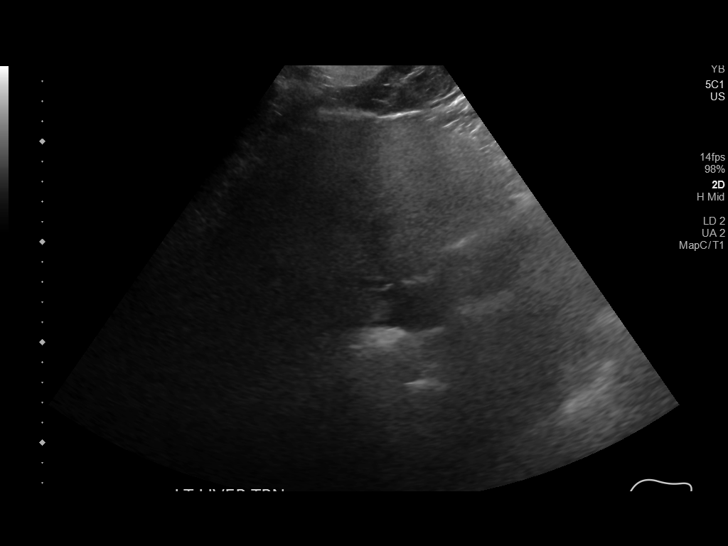
[im 37/50]
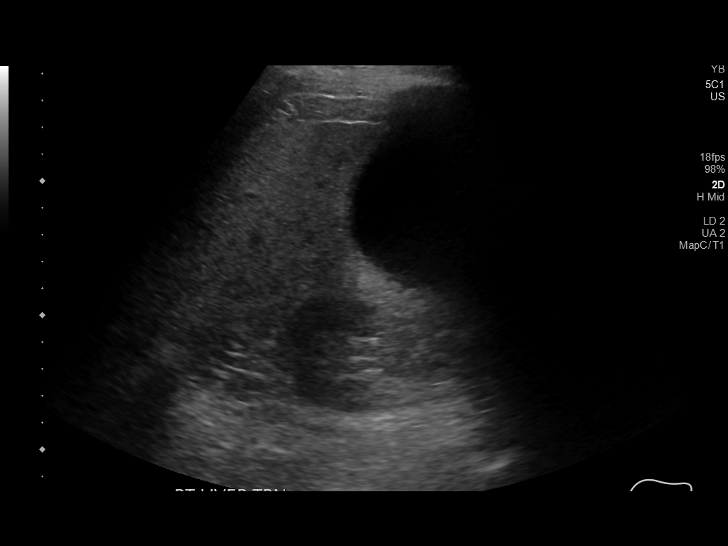
[im 41/50]
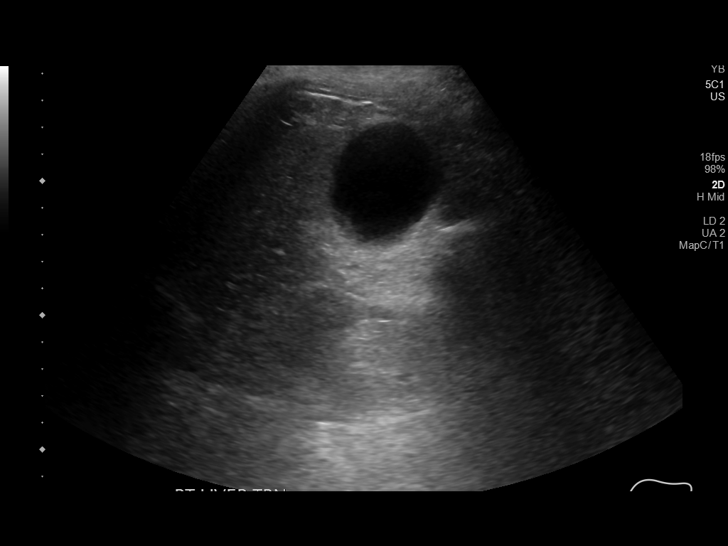
[im 45/50]
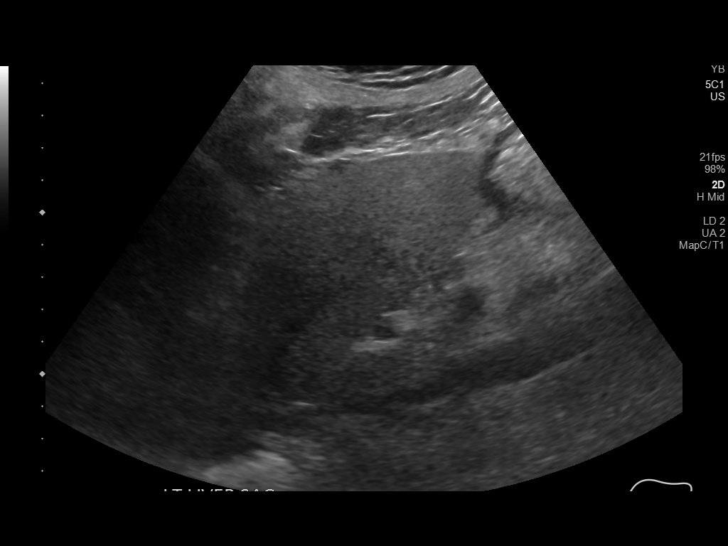
[im 50/50]
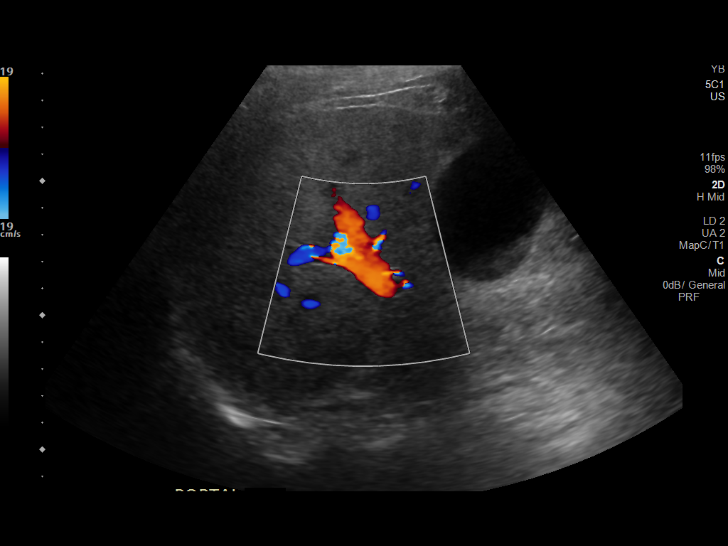

[14 of 25 positions shown; findings below may reference images not displayed]

FINDINGS: Gallbladder:

On some images the gallbladder wall appears normal, but on image 6
there is thickening at the fundus of 5-6 millimeters. Echogenic
partially shadowing cholelithiasis identified (image 11). There is
also dependent sludge within the gallbladder. Gallstones size
estimated at 8 millimeters.

No sonographic Murphy sign elicited.

Common bile duct:

Diameter: 5 millimeters, normal.

Liver:

Echogenic liver (image 22). No discrete liver lesion. No
intrahepatic biliary ductal dilatation. Portal vein is patent on
color Doppler imaging with normal direction of blood flow towards
the liver.

Other findings: Negative visible right kidney.
IMPRESSION: 1. Focal gallbladder wall thickening with sludge and gallstones.
Together with the earlier CTA appearance this is suspicious for
Acute Cholecystitis.
2. CBD remains within normal limits.  Fatty liver disease.

## 2020-04-20 DIAGNOSIS — K219 Gastro-esophageal reflux disease without esophagitis: Secondary | ICD-10-CM | POA: Diagnosis not present

## 2020-04-20 DIAGNOSIS — E039 Hypothyroidism, unspecified: Secondary | ICD-10-CM | POA: Diagnosis not present

## 2020-04-20 DIAGNOSIS — I10 Essential (primary) hypertension: Secondary | ICD-10-CM | POA: Diagnosis not present

## 2020-04-22 DIAGNOSIS — Z79891 Long term (current) use of opiate analgesic: Secondary | ICD-10-CM | POA: Diagnosis not present

## 2020-04-22 DIAGNOSIS — M5136 Other intervertebral disc degeneration, lumbar region: Secondary | ICD-10-CM | POA: Diagnosis not present

## 2020-04-22 DIAGNOSIS — M170 Bilateral primary osteoarthritis of knee: Secondary | ICD-10-CM | POA: Diagnosis not present

## 2020-04-22 DIAGNOSIS — G894 Chronic pain syndrome: Secondary | ICD-10-CM | POA: Diagnosis not present

## 2020-04-29 DIAGNOSIS — L409 Psoriasis, unspecified: Secondary | ICD-10-CM | POA: Diagnosis not present

## 2020-04-29 DIAGNOSIS — M6530 Trigger finger, unspecified finger: Secondary | ICD-10-CM | POA: Diagnosis not present

## 2020-04-29 DIAGNOSIS — M1180 Other specified crystal arthropathies, unspecified site: Secondary | ICD-10-CM | POA: Diagnosis not present

## 2020-04-29 DIAGNOSIS — Z8739 Personal history of other diseases of the musculoskeletal system and connective tissue: Secondary | ICD-10-CM | POA: Diagnosis not present

## 2020-04-29 DIAGNOSIS — Z7952 Long term (current) use of systemic steroids: Secondary | ICD-10-CM | POA: Diagnosis not present

## 2020-05-14 DIAGNOSIS — M170 Bilateral primary osteoarthritis of knee: Secondary | ICD-10-CM | POA: Diagnosis not present

## 2020-05-26 DIAGNOSIS — D485 Neoplasm of uncertain behavior of skin: Secondary | ICD-10-CM | POA: Diagnosis not present

## 2020-05-26 DIAGNOSIS — D692 Other nonthrombocytopenic purpura: Secondary | ICD-10-CM | POA: Diagnosis not present

## 2020-05-26 DIAGNOSIS — Z85828 Personal history of other malignant neoplasm of skin: Secondary | ICD-10-CM | POA: Diagnosis not present

## 2020-05-26 DIAGNOSIS — I788 Other diseases of capillaries: Secondary | ICD-10-CM | POA: Diagnosis not present

## 2020-05-26 DIAGNOSIS — H61021 Chronic perichondritis of right external ear: Secondary | ICD-10-CM | POA: Diagnosis not present

## 2020-05-26 DIAGNOSIS — L570 Actinic keratosis: Secondary | ICD-10-CM | POA: Diagnosis not present

## 2020-05-26 DIAGNOSIS — H61001 Unspecified perichondritis of right external ear: Secondary | ICD-10-CM | POA: Diagnosis not present

## 2020-06-10 DIAGNOSIS — M353 Polymyalgia rheumatica: Secondary | ICD-10-CM | POA: Diagnosis not present

## 2020-06-10 DIAGNOSIS — Z0001 Encounter for general adult medical examination with abnormal findings: Secondary | ICD-10-CM | POA: Diagnosis not present

## 2020-06-10 DIAGNOSIS — D692 Other nonthrombocytopenic purpura: Secondary | ICD-10-CM | POA: Diagnosis not present

## 2020-06-10 DIAGNOSIS — E039 Hypothyroidism, unspecified: Secondary | ICD-10-CM | POA: Diagnosis not present

## 2020-06-10 DIAGNOSIS — R05 Cough: Secondary | ICD-10-CM | POA: Diagnosis not present

## 2020-06-10 DIAGNOSIS — I10 Essential (primary) hypertension: Secondary | ICD-10-CM | POA: Diagnosis not present

## 2020-06-18 DIAGNOSIS — I10 Essential (primary) hypertension: Secondary | ICD-10-CM | POA: Diagnosis not present

## 2020-06-24 DIAGNOSIS — M25561 Pain in right knee: Secondary | ICD-10-CM | POA: Diagnosis not present

## 2020-06-24 DIAGNOSIS — R262 Difficulty in walking, not elsewhere classified: Secondary | ICD-10-CM | POA: Diagnosis not present

## 2020-06-24 DIAGNOSIS — M25562 Pain in left knee: Secondary | ICD-10-CM | POA: Diagnosis not present

## 2020-06-24 DIAGNOSIS — R293 Abnormal posture: Secondary | ICD-10-CM | POA: Diagnosis not present

## 2020-06-29 DIAGNOSIS — M25562 Pain in left knee: Secondary | ICD-10-CM | POA: Diagnosis not present

## 2020-06-29 DIAGNOSIS — M25561 Pain in right knee: Secondary | ICD-10-CM | POA: Diagnosis not present

## 2020-06-29 DIAGNOSIS — R293 Abnormal posture: Secondary | ICD-10-CM | POA: Diagnosis not present

## 2020-06-29 DIAGNOSIS — R262 Difficulty in walking, not elsewhere classified: Secondary | ICD-10-CM | POA: Diagnosis not present

## 2020-06-30 DIAGNOSIS — H3581 Retinal edema: Secondary | ICD-10-CM | POA: Diagnosis not present

## 2020-06-30 DIAGNOSIS — H40013 Open angle with borderline findings, low risk, bilateral: Secondary | ICD-10-CM | POA: Diagnosis not present

## 2020-06-30 DIAGNOSIS — H30033 Focal chorioretinal inflammation, peripheral, bilateral: Secondary | ICD-10-CM | POA: Diagnosis not present

## 2020-06-30 DIAGNOSIS — Z961 Presence of intraocular lens: Secondary | ICD-10-CM | POA: Diagnosis not present

## 2020-07-03 DIAGNOSIS — I10 Essential (primary) hypertension: Secondary | ICD-10-CM | POA: Diagnosis not present

## 2020-08-04 DIAGNOSIS — I10 Essential (primary) hypertension: Secondary | ICD-10-CM | POA: Diagnosis not present

## 2020-08-04 DIAGNOSIS — E039 Hypothyroidism, unspecified: Secondary | ICD-10-CM | POA: Diagnosis not present

## 2020-08-04 DIAGNOSIS — Z23 Encounter for immunization: Secondary | ICD-10-CM | POA: Diagnosis not present

## 2020-08-04 DIAGNOSIS — I4891 Unspecified atrial fibrillation: Secondary | ICD-10-CM | POA: Diagnosis not present

## 2020-08-18 ENCOUNTER — Inpatient Hospital Stay: Admission: RE | Admit: 2020-08-18 | Payer: Medicare Other | Source: Ambulatory Visit

## 2020-08-19 ENCOUNTER — Other Ambulatory Visit: Payer: Medicare Other

## 2020-08-19 DIAGNOSIS — Z79899 Other long term (current) drug therapy: Secondary | ICD-10-CM | POA: Diagnosis not present

## 2020-08-19 DIAGNOSIS — Z79891 Long term (current) use of opiate analgesic: Secondary | ICD-10-CM | POA: Diagnosis not present

## 2020-08-19 DIAGNOSIS — Z5181 Encounter for therapeutic drug level monitoring: Secondary | ICD-10-CM | POA: Diagnosis not present

## 2020-08-28 DIAGNOSIS — M545 Low back pain, unspecified: Secondary | ICD-10-CM | POA: Diagnosis not present

## 2020-09-02 DIAGNOSIS — E039 Hypothyroidism, unspecified: Secondary | ICD-10-CM | POA: Diagnosis not present

## 2020-09-10 DIAGNOSIS — I4891 Unspecified atrial fibrillation: Secondary | ICD-10-CM | POA: Diagnosis not present

## 2020-09-10 DIAGNOSIS — E039 Hypothyroidism, unspecified: Secondary | ICD-10-CM | POA: Diagnosis not present

## 2020-09-10 DIAGNOSIS — I2510 Atherosclerotic heart disease of native coronary artery without angina pectoris: Secondary | ICD-10-CM | POA: Diagnosis not present

## 2020-09-10 DIAGNOSIS — I10 Essential (primary) hypertension: Secondary | ICD-10-CM | POA: Diagnosis not present

## 2020-09-15 DIAGNOSIS — N183 Chronic kidney disease, stage 3 unspecified: Secondary | ICD-10-CM | POA: Diagnosis not present

## 2020-09-15 DIAGNOSIS — I129 Hypertensive chronic kidney disease with stage 1 through stage 4 chronic kidney disease, or unspecified chronic kidney disease: Secondary | ICD-10-CM | POA: Diagnosis not present

## 2020-09-15 DIAGNOSIS — N2581 Secondary hyperparathyroidism of renal origin: Secondary | ICD-10-CM | POA: Diagnosis not present

## 2020-09-15 DIAGNOSIS — N189 Chronic kidney disease, unspecified: Secondary | ICD-10-CM | POA: Diagnosis not present

## 2020-09-15 DIAGNOSIS — D631 Anemia in chronic kidney disease: Secondary | ICD-10-CM | POA: Diagnosis not present

## 2020-09-15 DIAGNOSIS — E039 Hypothyroidism, unspecified: Secondary | ICD-10-CM | POA: Diagnosis not present

## 2020-09-21 ENCOUNTER — Inpatient Hospital Stay: Admission: RE | Admit: 2020-09-21 | Payer: Medicare Other | Source: Ambulatory Visit

## 2020-09-21 DIAGNOSIS — R1319 Other dysphagia: Secondary | ICD-10-CM | POA: Diagnosis not present

## 2020-09-21 DIAGNOSIS — K225 Diverticulum of esophagus, acquired: Secondary | ICD-10-CM | POA: Diagnosis not present

## 2020-09-21 DIAGNOSIS — Z85810 Personal history of malignant neoplasm of tongue: Secondary | ICD-10-CM | POA: Diagnosis not present

## 2020-10-05 ENCOUNTER — Other Ambulatory Visit: Payer: Self-pay | Admitting: Otolaryngology

## 2020-10-05 DIAGNOSIS — K225 Diverticulum of esophagus, acquired: Secondary | ICD-10-CM

## 2020-10-07 ENCOUNTER — Other Ambulatory Visit: Payer: Self-pay | Admitting: Otolaryngology

## 2020-10-07 ENCOUNTER — Ambulatory Visit
Admission: RE | Admit: 2020-10-07 | Discharge: 2020-10-07 | Disposition: A | Discharge status: Home / Self Care | Payer: Medicare Other | Source: Ambulatory Visit | Attending: Otolaryngology | Admitting: Otolaryngology

## 2020-10-07 DIAGNOSIS — K225 Diverticulum of esophagus, acquired: Secondary | ICD-10-CM

## 2020-10-07 DIAGNOSIS — K2289 Other specified disease of esophagus: Secondary | ICD-10-CM | POA: Diagnosis not present

## 2020-10-16 DIAGNOSIS — Z85810 Personal history of malignant neoplasm of tongue: Secondary | ICD-10-CM | POA: Diagnosis not present

## 2020-10-16 DIAGNOSIS — R1319 Other dysphagia: Secondary | ICD-10-CM | POA: Diagnosis not present

## 2020-10-16 DIAGNOSIS — K225 Diverticulum of esophagus, acquired: Secondary | ICD-10-CM | POA: Diagnosis not present

## 2020-10-22 DIAGNOSIS — I2510 Atherosclerotic heart disease of native coronary artery without angina pectoris: Secondary | ICD-10-CM | POA: Diagnosis not present

## 2020-10-22 DIAGNOSIS — I10 Essential (primary) hypertension: Secondary | ICD-10-CM | POA: Diagnosis not present

## 2020-10-22 DIAGNOSIS — E039 Hypothyroidism, unspecified: Secondary | ICD-10-CM | POA: Diagnosis not present

## 2020-10-22 DIAGNOSIS — I4891 Unspecified atrial fibrillation: Secondary | ICD-10-CM | POA: Diagnosis not present

## 2020-10-29 DIAGNOSIS — M8589 Other specified disorders of bone density and structure, multiple sites: Secondary | ICD-10-CM | POA: Diagnosis not present

## 2020-11-12 DIAGNOSIS — M79643 Pain in unspecified hand: Secondary | ICD-10-CM | POA: Diagnosis not present

## 2020-11-12 DIAGNOSIS — M1180 Other specified crystal arthropathies, unspecified site: Secondary | ICD-10-CM | POA: Diagnosis not present

## 2020-11-12 DIAGNOSIS — M810 Age-related osteoporosis without current pathological fracture: Secondary | ICD-10-CM | POA: Diagnosis not present

## 2020-11-12 DIAGNOSIS — N183 Chronic kidney disease, stage 3 unspecified: Secondary | ICD-10-CM | POA: Diagnosis not present

## 2020-11-12 DIAGNOSIS — M6530 Trigger finger, unspecified finger: Secondary | ICD-10-CM | POA: Diagnosis not present

## 2020-11-12 DIAGNOSIS — Z7952 Long term (current) use of systemic steroids: Secondary | ICD-10-CM | POA: Diagnosis not present

## 2020-11-12 DIAGNOSIS — Z8739 Personal history of other diseases of the musculoskeletal system and connective tissue: Secondary | ICD-10-CM | POA: Diagnosis not present

## 2020-11-12 DIAGNOSIS — L409 Psoriasis, unspecified: Secondary | ICD-10-CM | POA: Diagnosis not present

## 2020-12-03 DIAGNOSIS — Z85828 Personal history of other malignant neoplasm of skin: Secondary | ICD-10-CM | POA: Diagnosis not present

## 2020-12-03 DIAGNOSIS — L821 Other seborrheic keratosis: Secondary | ICD-10-CM | POA: Diagnosis not present

## 2020-12-03 DIAGNOSIS — L308 Other specified dermatitis: Secondary | ICD-10-CM | POA: Diagnosis not present

## 2020-12-03 DIAGNOSIS — L400 Psoriasis vulgaris: Secondary | ICD-10-CM | POA: Diagnosis not present

## 2020-12-03 DIAGNOSIS — L570 Actinic keratosis: Secondary | ICD-10-CM | POA: Diagnosis not present

## 2020-12-03 DIAGNOSIS — Z85820 Personal history of malignant melanoma of skin: Secondary | ICD-10-CM | POA: Diagnosis not present

## 2021-01-01 ENCOUNTER — Emergency Department (HOSPITAL_COMMUNITY): Payer: Medicare Other

## 2021-01-01 ENCOUNTER — Inpatient Hospital Stay (HOSPITAL_COMMUNITY)
Admission: EM | Admit: 2021-01-01 | Discharge: 2021-01-05 | DRG: 296 | Disposition: E | Payer: Medicare Other | Attending: Pulmonary Disease | Admitting: Pulmonary Disease

## 2021-01-01 DIAGNOSIS — J9601 Acute respiratory failure with hypoxia: Secondary | ICD-10-CM | POA: Diagnosis not present

## 2021-01-01 DIAGNOSIS — E161 Other hypoglycemia: Secondary | ICD-10-CM | POA: Diagnosis not present

## 2021-01-01 DIAGNOSIS — N179 Acute kidney failure, unspecified: Secondary | ICD-10-CM | POA: Diagnosis not present

## 2021-01-01 DIAGNOSIS — I499 Cardiac arrhythmia, unspecified: Secondary | ICD-10-CM | POA: Diagnosis not present

## 2021-01-01 DIAGNOSIS — C3412 Malignant neoplasm of upper lobe, left bronchus or lung: Secondary | ICD-10-CM | POA: Diagnosis not present

## 2021-01-01 DIAGNOSIS — R402432 Glasgow coma scale score 3-8, at arrival to emergency department: Secondary | ICD-10-CM

## 2021-01-01 DIAGNOSIS — G40409 Other generalized epilepsy and epileptic syndromes, not intractable, without status epilepticus: Secondary | ICD-10-CM | POA: Diagnosis not present

## 2021-01-01 DIAGNOSIS — R739 Hyperglycemia, unspecified: Secondary | ICD-10-CM | POA: Diagnosis present

## 2021-01-01 DIAGNOSIS — K6389 Other specified diseases of intestine: Secondary | ICD-10-CM | POA: Diagnosis not present

## 2021-01-01 DIAGNOSIS — S22068A Other fracture of T7-T8 thoracic vertebra, initial encounter for closed fracture: Secondary | ICD-10-CM | POA: Diagnosis not present

## 2021-01-01 DIAGNOSIS — M47816 Spondylosis without myelopathy or radiculopathy, lumbar region: Secondary | ICD-10-CM | POA: Diagnosis not present

## 2021-01-01 DIAGNOSIS — Z79890 Hormone replacement therapy (postmenopausal): Secondary | ICD-10-CM

## 2021-01-01 DIAGNOSIS — Z789 Other specified health status: Secondary | ICD-10-CM

## 2021-01-01 DIAGNOSIS — G931 Anoxic brain damage, not elsewhere classified: Secondary | ICD-10-CM | POA: Diagnosis present

## 2021-01-01 DIAGNOSIS — J9811 Atelectasis: Secondary | ICD-10-CM | POA: Diagnosis not present

## 2021-01-01 DIAGNOSIS — M419 Scoliosis, unspecified: Secondary | ICD-10-CM | POA: Diagnosis not present

## 2021-01-01 DIAGNOSIS — K219 Gastro-esophageal reflux disease without esophagitis: Secondary | ICD-10-CM | POA: Diagnosis present

## 2021-01-01 DIAGNOSIS — J449 Chronic obstructive pulmonary disease, unspecified: Secondary | ICD-10-CM | POA: Diagnosis not present

## 2021-01-01 DIAGNOSIS — R578 Other shock: Secondary | ICD-10-CM | POA: Diagnosis not present

## 2021-01-01 DIAGNOSIS — J9620 Acute and chronic respiratory failure, unspecified whether with hypoxia or hypercapnia: Secondary | ICD-10-CM | POA: Diagnosis not present

## 2021-01-01 DIAGNOSIS — S22069A Unspecified fracture of T7-T8 vertebra, initial encounter for closed fracture: Secondary | ICD-10-CM | POA: Diagnosis present

## 2021-01-01 DIAGNOSIS — S2222XA Fracture of body of sternum, initial encounter for closed fracture: Secondary | ICD-10-CM | POA: Diagnosis not present

## 2021-01-01 DIAGNOSIS — J320 Chronic maxillary sinusitis: Secondary | ICD-10-CM | POA: Diagnosis not present

## 2021-01-01 DIAGNOSIS — S270XXA Traumatic pneumothorax, initial encounter: Secondary | ICD-10-CM | POA: Diagnosis present

## 2021-01-01 DIAGNOSIS — N182 Chronic kidney disease, stage 2 (mild): Secondary | ICD-10-CM | POA: Diagnosis not present

## 2021-01-01 DIAGNOSIS — Z4682 Encounter for fitting and adjustment of non-vascular catheter: Secondary | ICD-10-CM | POA: Diagnosis not present

## 2021-01-01 DIAGNOSIS — M069 Rheumatoid arthritis, unspecified: Secondary | ICD-10-CM | POA: Diagnosis not present

## 2021-01-01 DIAGNOSIS — Z7952 Long term (current) use of systemic steroids: Secondary | ICD-10-CM

## 2021-01-01 DIAGNOSIS — Z7189 Other specified counseling: Secondary | ICD-10-CM

## 2021-01-01 DIAGNOSIS — M5126 Other intervertebral disc displacement, lumbar region: Secondary | ICD-10-CM | POA: Diagnosis not present

## 2021-01-01 DIAGNOSIS — M549 Dorsalgia, unspecified: Secondary | ICD-10-CM

## 2021-01-01 DIAGNOSIS — M48061 Spinal stenosis, lumbar region without neurogenic claudication: Secondary | ICD-10-CM | POA: Diagnosis not present

## 2021-01-01 DIAGNOSIS — R079 Chest pain, unspecified: Secondary | ICD-10-CM | POA: Diagnosis not present

## 2021-01-01 DIAGNOSIS — R34 Anuria and oliguria: Secondary | ICD-10-CM | POA: Diagnosis present

## 2021-01-01 DIAGNOSIS — S2220XA Unspecified fracture of sternum, initial encounter for closed fracture: Secondary | ICD-10-CM | POA: Diagnosis present

## 2021-01-01 DIAGNOSIS — S3993XA Unspecified injury of pelvis, initial encounter: Secondary | ICD-10-CM | POA: Diagnosis not present

## 2021-01-01 DIAGNOSIS — E872 Acidosis: Secondary | ICD-10-CM | POA: Diagnosis not present

## 2021-01-01 DIAGNOSIS — I129 Hypertensive chronic kidney disease with stage 1 through stage 4 chronic kidney disease, or unspecified chronic kidney disease: Secondary | ICD-10-CM | POA: Diagnosis present

## 2021-01-01 DIAGNOSIS — J9600 Acute respiratory failure, unspecified whether with hypoxia or hypercapnia: Secondary | ICD-10-CM | POA: Diagnosis present

## 2021-01-01 DIAGNOSIS — Y848 Other medical procedures as the cause of abnormal reaction of the patient, or of later complication, without mention of misadventure at the time of the procedure: Secondary | ICD-10-CM | POA: Diagnosis present

## 2021-01-01 DIAGNOSIS — I898 Other specified noninfective disorders of lymphatic vessels and lymph nodes: Secondary | ICD-10-CM | POA: Diagnosis not present

## 2021-01-01 DIAGNOSIS — J439 Emphysema, unspecified: Secondary | ICD-10-CM | POA: Diagnosis not present

## 2021-01-01 DIAGNOSIS — Z515 Encounter for palliative care: Secondary | ICD-10-CM

## 2021-01-01 DIAGNOSIS — I469 Cardiac arrest, cause unspecified: Secondary | ICD-10-CM | POA: Diagnosis not present

## 2021-01-01 DIAGNOSIS — J3489 Other specified disorders of nose and nasal sinuses: Secondary | ICD-10-CM | POA: Diagnosis not present

## 2021-01-01 DIAGNOSIS — E875 Hyperkalemia: Secondary | ICD-10-CM | POA: Diagnosis not present

## 2021-01-01 DIAGNOSIS — S2231XA Fracture of one rib, right side, initial encounter for closed fracture: Secondary | ICD-10-CM | POA: Diagnosis not present

## 2021-01-01 DIAGNOSIS — I2510 Atherosclerotic heart disease of native coronary artery without angina pectoris: Secondary | ICD-10-CM | POA: Diagnosis not present

## 2021-01-01 DIAGNOSIS — Z91030 Bee allergy status: Secondary | ICD-10-CM

## 2021-01-01 DIAGNOSIS — Z743 Need for continuous supervision: Secondary | ICD-10-CM | POA: Diagnosis not present

## 2021-01-01 DIAGNOSIS — Z79899 Other long term (current) drug therapy: Secondary | ICD-10-CM

## 2021-01-01 DIAGNOSIS — J9602 Acute respiratory failure with hypercapnia: Secondary | ICD-10-CM | POA: Diagnosis not present

## 2021-01-01 DIAGNOSIS — Z20822 Contact with and (suspected) exposure to covid-19: Secondary | ICD-10-CM | POA: Diagnosis present

## 2021-01-01 DIAGNOSIS — S32038A Other fracture of third lumbar vertebra, initial encounter for closed fracture: Secondary | ICD-10-CM | POA: Diagnosis not present

## 2021-01-01 DIAGNOSIS — M9689 Other intraoperative and postprocedural complications and disorders of the musculoskeletal system: Secondary | ICD-10-CM | POA: Diagnosis present

## 2021-01-01 DIAGNOSIS — G9341 Metabolic encephalopathy: Secondary | ICD-10-CM | POA: Diagnosis not present

## 2021-01-01 DIAGNOSIS — I480 Paroxysmal atrial fibrillation: Secondary | ICD-10-CM | POA: Diagnosis present

## 2021-01-01 DIAGNOSIS — S2243XA Multiple fractures of ribs, bilateral, initial encounter for closed fracture: Secondary | ICD-10-CM | POA: Diagnosis not present

## 2021-01-01 DIAGNOSIS — Z955 Presence of coronary angioplasty implant and graft: Secondary | ICD-10-CM

## 2021-01-01 DIAGNOSIS — J939 Pneumothorax, unspecified: Secondary | ICD-10-CM | POA: Diagnosis not present

## 2021-01-01 DIAGNOSIS — G253 Myoclonus: Secondary | ICD-10-CM | POA: Diagnosis not present

## 2021-01-01 DIAGNOSIS — Z8249 Family history of ischemic heart disease and other diseases of the circulatory system: Secondary | ICD-10-CM

## 2021-01-01 DIAGNOSIS — I462 Cardiac arrest due to underlying cardiac condition: Secondary | ICD-10-CM | POA: Diagnosis not present

## 2021-01-01 DIAGNOSIS — M5127 Other intervertebral disc displacement, lumbosacral region: Secondary | ICD-10-CM | POA: Diagnosis not present

## 2021-01-01 DIAGNOSIS — Z87891 Personal history of nicotine dependence: Secondary | ICD-10-CM

## 2021-01-01 DIAGNOSIS — Z888 Allergy status to other drugs, medicaments and biological substances status: Secondary | ICD-10-CM

## 2021-01-01 DIAGNOSIS — M4807 Spinal stenosis, lumbosacral region: Secondary | ICD-10-CM | POA: Diagnosis not present

## 2021-01-01 DIAGNOSIS — I959 Hypotension, unspecified: Secondary | ICD-10-CM | POA: Diagnosis present

## 2021-01-01 DIAGNOSIS — R6889 Other general symptoms and signs: Secondary | ICD-10-CM | POA: Diagnosis not present

## 2021-01-01 DIAGNOSIS — S32039A Unspecified fracture of third lumbar vertebra, initial encounter for closed fracture: Secondary | ICD-10-CM | POA: Diagnosis present

## 2021-01-01 DIAGNOSIS — S72002A Fracture of unspecified part of neck of left femur, initial encounter for closed fracture: Secondary | ICD-10-CM | POA: Diagnosis not present

## 2021-01-01 DIAGNOSIS — E039 Hypothyroidism, unspecified: Secondary | ICD-10-CM | POA: Diagnosis present

## 2021-01-01 DIAGNOSIS — Z66 Do not resuscitate: Secondary | ICD-10-CM | POA: Diagnosis not present

## 2021-01-01 DIAGNOSIS — T1490XA Injury, unspecified, initial encounter: Secondary | ICD-10-CM

## 2021-01-01 DIAGNOSIS — K7200 Acute and subacute hepatic failure without coma: Secondary | ICD-10-CM | POA: Diagnosis present

## 2021-01-01 DIAGNOSIS — S72052A Unspecified fracture of head of left femur, initial encounter for closed fracture: Secondary | ICD-10-CM | POA: Diagnosis not present

## 2021-01-01 DIAGNOSIS — G9349 Other encephalopathy: Secondary | ICD-10-CM | POA: Diagnosis not present

## 2021-01-01 DIAGNOSIS — J323 Chronic sphenoidal sinusitis: Secondary | ICD-10-CM | POA: Diagnosis not present

## 2021-01-01 DIAGNOSIS — Z7901 Long term (current) use of anticoagulants: Secondary | ICD-10-CM

## 2021-01-01 DIAGNOSIS — R404 Transient alteration of awareness: Secondary | ICD-10-CM | POA: Diagnosis not present

## 2021-01-01 DIAGNOSIS — J9690 Respiratory failure, unspecified, unspecified whether with hypoxia or hypercapnia: Secondary | ICD-10-CM | POA: Diagnosis not present

## 2021-01-01 LAB — COMPREHENSIVE METABOLIC PANEL
ALT: 374 U/L — ABNORMAL HIGH (ref 0–44)
AST: 1022 U/L — ABNORMAL HIGH (ref 15–41)
Albumin: 2.4 g/dL — ABNORMAL LOW (ref 3.5–5.0)
Alkaline Phosphatase: 94 U/L (ref 38–126)
Anion gap: 27 — ABNORMAL HIGH (ref 5–15)
BUN: 18 mg/dL (ref 8–23)
CO2: 11 mmol/L — ABNORMAL LOW (ref 22–32)
Calcium: 9.1 mg/dL (ref 8.9–10.3)
Chloride: 100 mmol/L (ref 98–111)
Creatinine, Ser: 2.42 mg/dL — ABNORMAL HIGH (ref 0.61–1.24)
GFR, Estimated: 27 mL/min — ABNORMAL LOW (ref >60)
Glucose, Bld: 253 mg/dL — ABNORMAL HIGH (ref 70–99)
Potassium: 5.2 mmol/L — ABNORMAL HIGH (ref 3.5–5.1)
Sodium: 138 mmol/L (ref 135–145)
Total Bilirubin: 2.2 mg/dL — ABNORMAL HIGH (ref 0.3–1.2)
Total Protein: 6.5 g/dL (ref 6.5–8.1)

## 2021-01-01 LAB — I-STAT ARTERIAL BLOOD GAS, ED
Acid-base deficit: 15 mmol/L — ABNORMAL HIGH (ref 0.0–2.0)
Bicarbonate: 12.6 mmol/L — ABNORMAL LOW (ref 20.0–28.0)
Calcium, Ion: 1.12 mmol/L — ABNORMAL LOW (ref 1.15–1.40)
HCT: 28 % — ABNORMAL LOW (ref 39.0–52.0)
Hemoglobin: 9.5 g/dL — ABNORMAL LOW (ref 13.0–17.0)
O2 Saturation: 100 %
Patient temperature: 96.4
Potassium: 3.8 mmol/L (ref 3.5–5.1)
Sodium: 136 mmol/L (ref 135–145)
TCO2: 14 mmol/L — ABNORMAL LOW (ref 22–32)
pCO2 arterial: 33.1 mmHg (ref 32.0–48.0)
pH, Arterial: 7.183 — CL (ref 7.350–7.450)
pO2, Arterial: 411 mmHg — ABNORMAL HIGH (ref 83.0–108.0)

## 2021-01-01 LAB — CBC WITH DIFFERENTIAL/PLATELET
Abs Immature Granulocytes: 0 10*3/uL (ref 0.00–0.07)
Basophils Absolute: 0 10*3/uL (ref 0.0–0.1)
Basophils Relative: 0 %
Eosinophils Absolute: 0.8 10*3/uL — ABNORMAL HIGH (ref 0.0–0.5)
Eosinophils Relative: 5 %
HCT: 42.7 % (ref 39.0–52.0)
Hemoglobin: 12.9 g/dL — ABNORMAL LOW (ref 13.0–17.0)
Lymphocytes Relative: 43 %
Lymphs Abs: 7.1 10*3/uL — ABNORMAL HIGH (ref 0.7–4.0)
MCH: 33.2 pg (ref 26.0–34.0)
MCHC: 30.2 g/dL (ref 30.0–36.0)
MCV: 109.8 fL — ABNORMAL HIGH (ref 80.0–100.0)
Monocytes Absolute: 0.3 10*3/uL (ref 0.1–1.0)
Monocytes Relative: 2 %
Neutro Abs: 8.3 10*3/uL — ABNORMAL HIGH (ref 1.7–7.7)
Neutrophils Relative %: 50 %
Platelets: 424 10*3/uL — ABNORMAL HIGH (ref 150–400)
RBC: 3.89 MIL/uL — ABNORMAL LOW (ref 4.22–5.81)
RDW: 13.2 % (ref 11.5–15.5)
WBC: 16.5 10*3/uL — ABNORMAL HIGH (ref 4.0–10.5)
nRBC: 0 /100 WBC
nRBC: 0.2 % (ref 0.0–0.2)

## 2021-01-01 LAB — URINALYSIS, ROUTINE W REFLEX MICROSCOPIC
Bilirubin Urine: NEGATIVE
Glucose, UA: 150 mg/dL — AB
Ketones, ur: 5 mg/dL — AB
Leukocytes,Ua: NEGATIVE
Nitrite: NEGATIVE
Protein, ur: 100 mg/dL — AB
RBC / HPF: >50 RBC/hpf — ABNORMAL HIGH (ref 0–5)
Specific Gravity, Urine: 1.017 (ref 1.005–1.030)
pH: 7 (ref 5.0–8.0)

## 2021-01-01 LAB — TROPONIN I (HIGH SENSITIVITY)
Troponin I (High Sensitivity): 78 ng/L — ABNORMAL HIGH (ref <18)
Troponin I (High Sensitivity): 57 ng/L — ABNORMAL HIGH (ref <18)

## 2021-01-01 LAB — LACTIC ACID, PLASMA
Lactic Acid, Venous: >11 mmol/L (ref 0.5–1.9)
Lactic Acid, Venous: >11 mmol/L (ref 0.5–1.9)

## 2021-01-01 LAB — PROTIME-INR
INR: 1.6 — ABNORMAL HIGH (ref 0.8–1.2)
Prothrombin Time: 18.6 seconds — ABNORMAL HIGH (ref 11.4–15.2)

## 2021-01-01 LAB — CBG MONITORING, ED
Glucose-Capillary: 64 mg/dL — ABNORMAL LOW (ref 70–99)
Glucose-Capillary: 177 mg/dL — ABNORMAL HIGH (ref 70–99)

## 2021-01-01 LAB — RESP PANEL BY RT-PCR (FLU A&B, COVID) ARPGX2
Influenza A by PCR: NEGATIVE
Influenza B by PCR: NEGATIVE
SARS Coronavirus 2 by RT PCR: NEGATIVE

## 2021-01-01 MED ORDER — IOHEXOL 300 MG/ML  SOLN
100.0000 mL | Freq: Once | INTRAMUSCULAR | Status: AC | PRN
  Administered 2021-01-01: 100 mL via INTRAVENOUS

## 2021-01-01 MED ORDER — FENTANYL CITRATE (PF) 100 MCG/2ML IJ SOLN
INTRAMUSCULAR | Status: AC | PRN
  Administered 2021-01-01: 75 ug via INTRAVENOUS

## 2021-01-01 MED ORDER — LIDOCAINE HCL (PF) 1 % IJ SOLN
INTRAMUSCULAR | Status: AC
  Filled 2021-01-01: qty 5

## 2021-01-01 MED ORDER — FENTANYL CITRATE (PF) 100 MCG/2ML IJ SOLN
INTRAMUSCULAR | Status: AC
  Filled 2021-01-01: qty 2

## 2021-01-01 MED ORDER — SODIUM BICARBONATE 8.4 % IV SOLN
50.0000 meq | Freq: Once | INTRAVENOUS | Status: AC
  Administered 2021-01-01: 50 meq via INTRAVENOUS

## 2021-01-01 MED ORDER — SODIUM CHLORIDE 0.9 % IV SOLN
INTRAVENOUS | Status: AC | PRN
  Administered 2021-01-01 (×2): 1000 mL via INTRAVENOUS

## 2021-01-01 NOTE — ED Notes (Signed)
Urinary catheter attempted initially by NT with resistance. Reinsertion without resistance. Blood clot noted in catheter after insertion with pink/yellow urine

## 2021-01-01 NOTE — ED Notes (Signed)
Multiple attempts at NG/OG placement without success

## 2021-01-01 NOTE — H&P (Incomplete)
NAME:  Gary [name], MRN:  [id number], DOB:  1941-10-14, LOS: 0 ADMISSION DATE:  12/26/2020, CONSULTATION DATE: 12/22/2020 REFERRING MD: EDP at [facility], CHIEF COMPLAINT: PEA arrest unknown downtime  Brief History:  Pea arrest  History of Present Illness: History from EDP  80 year old male who apparently lives alone but is quite functional.  Unknown downtime but apparently son spoke to him 1 day before admission on 12/31/2020.  Neighbor saw door was locked at 6.54pm 12/10/2020 but an hour later 7.50pm door was open and was found slumped over a rocking chair unknown downtime but possibly max 60 minutes.  PEA upon EMS arrival.  CPR x3 with epi x3.  No shock.  Upon arrival into the ER was hypotensive.  Significant evidence of trauma to the sternum and bilateral ribs and also L3 and T8 spine with evidence of sinusitis and new left upper lobe mass 2.3 cm groundglass opacity and bruising in the area of the abdomen where he was slumped over.  No history of trauma or fall available.  His blood pressure improved upon EDP placing right-sided pigtail chest tube.  Largely unresponsive except did have a gag reflex when ER nurse placed OG tube.  CCM called for admission.  Son is aware of the admission and talking to family about CODE STATUS.  Labs on admission showed possible AKI and anemia and hyperkalemia  He appears to have a history of rheumatoid arthritis, radiation therapy, coronary artery disease with status post angioplasty in 2017 by Dr. 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   reports that he quit smoking about 37 years ago. His smoking use included cigarettes. He has a 27.00 pack-year smoking history. He has never used smokeless tobacco.  Past Surgical History:  Procedure Laterality Date  . CARDIAC CATHETERIZATION N/A 09/13/2016   Procedure: Left Heart Cath and Coronary Angiography;  Surgeon: Adrian Prows, MD;  Location: [place] [facility];  Service: Cardiovascular;  Laterality: N/A;  . CARDIAC CATHETERIZATION N/A 10/11/2016   Procedure: Coronary Stent Intervention;  Surgeon: Adrian Prows, MD;  Location: [place] [facility];  Service: Cardiovascular;  Laterality: N/A;  LAD & 1st Diag  . CATARACT EXTRACTION W/ INTRAOCULAR LENS  IMPLANT, BILATERAL Bilateral 2007  . CHOLECYSTECTOMY N/A 03/20/2019   Procedure: LAPAROSCOPIC CHOLECYSTECTOMY;  Surgeon: Clovis Riley, MD;  Location: [facility];  Service: General;  Laterality: N/A;  . CORONARY ANGIOPLASTY  09/13/2016  . CORONARY ANGIOPLASTY WITH STENT PLACEMENT  2002  . FRACTURE SURGERY    . HIP PINNING,CANNULATED Left 12/22/2012   Procedure: CANNULATED HIP PINNING;  Surgeon: Mcarthur Rossetti, MD;  Location: [facility];  Service: Orthopedics;  Laterality: Left;  [name] MULTIPLE TOOTH EXTRACTIONS  ~ 1962   Edentulous  . RADICAL NECK DISSECTION Right 01/22/2013   [facility]  . SKIN CANCER EXCISION     "face, ears"  . SKIN GRAFT Left ~ 1961   "grafted skin off my stomach & put it on left hand that I had caught in fan belt"  . TONSILLECTOMY      Allergies  Allergen Reactions  . Bee Venom Swelling  .  Cellcept [Mycophenolate Mofetil] Other (See Comments)    Reaction unknown  . Colcrys [Colchicine] Other (See Comments)    myalgias  . Erythromycin Other (See Comments)    Reaction unknown  . Methotrexate Derivatives Hives, Itching and Rash    Only in tablet form  . Neosporin [Neomycin-Bacitracin Zn-Polymyx] Rash  . Trazodone Palpitations     Weakness, loss of appetite  . Ultram [Tramadol] Rash    Immunization History  Administered Date(s) Administered  . Influenza Whole 08/07/2012  . Influenza, High Dose Seasonal PF 09/03/2013, 07/09/2019  . Influenza-Unspecified 09/07/2018  . Zoster Recombinat (Shingrix) 04/03/2019, 06/19/2019    Family History  Problem Relation Age of Onset  . Coronary artery disease Brother 38  . Heart attack Brother   . Coronary artery disease Father        stents in 70's  . Cancer Mother        bone     Current Facility-Administered Medications:  .  0.9 %  sodium chloride infusion, , Intravenous, Continuous PRN, Horton, Kristie M, DO, Stopped at 12/29/2020 2254 .  lidocaine (PF) (XYLOCAINE) 1 % injection, , , ,   Current Outpatient Medications:  .  acyclovir (ZOVIRAX) 400 MG tablet, Take 400 mg by mouth 2 (two) times daily. , Disp: , Rfl:  .  atorvastatin (LIPITOR) 40 MG tablet, Take 40 mg by mouth 2 (two) times daily. , Disp: , Rfl:  .  Calcium Carbonate-Vitamin D (CALCIUM-D) 600-400 MG-UNIT TABS, Take 1 tablet by mouth daily. , Disp: , Rfl:  .  Cholecalciferol (VITAMIN D3) 50 MCG (2000 UT) capsule, Take 2,000 Units by mouth daily., Disp: , Rfl:  .  clobetasol (TEMOVATE) 0.05 % external solution, Apply 1 application topically as needed. psoriasis on head, Disp: , Rfl:  .  diazepam (VALIUM) 5 MG tablet, Take 1 tablet (5 mg total) by mouth every 12 (twelve) hours as needed for anxiety. For anxiety (Patient taking differently: Take 5 mg by mouth at bedtime as needed (sleep). For anxiety), Disp: 30 tablet, Rfl: 0 .  EPINEPHrine 0.3 mg/0.3 mL IJ SOAJ injection, Inject 0.3 mg into the muscle as directed. , Disp: , Rfl:  .  ferrous sulfate 220 (44 Fe) MG/5ML solution, , Disp: , Rfl:  .  Glucosamine 500 MG CAPS, Take 1 capsule by mouth daily., Disp: , Rfl:  .  lactose free nutrition (BOOST) LIQD, Take 237 mLs by mouth 3 (three) times daily between meals. As needed, Disp: , Rfl:  .  levothyroxine  (SYNTHROID, LEVOTHROID) 112 MCG tablet, Take 112 mcg daily before breakfast by mouth., Disp: , Rfl:  .  lisinopril (ZESTRIL) 5 MG tablet, Take 5 mg by mouth daily., Disp: , Rfl:  .  magnesium oxide (MAG-OX) 400 MG tablet, Take 400 mg by mouth at bedtime., Disp: , Rfl:  .  meclizine (ANTIVERT) 25 MG tablet, Take 25 mg by mouth every 12 (twelve) hours as needed for dizziness. , Disp: , Rfl:  .  mirtazapine (REMERON) 15 MG tablet, Take 7.5 mg by mouth at bedtime. , Disp: , Rfl:  .  Multiple Vitamin (MULTIVITAMIN WITH MINERALS) TABS tablet, Take 1 tablet by mouth daily., Disp: , Rfl:  .  nitroGLYCERIN (NITROSTAT) 0.4 MG SL tablet, TAKE 1 TABLET SUBLINGUALLY EVERY 5 MINUTES AS NEEDED FOR CHEST PAIN UP TO 3 DOSES THEN CALL 911, Disp: , Rfl:  .  Omega-3 1000 MG CAPS, Take 2 capsules by mouth daily., Disp: , Rfl:  .  pantoprazole (PROTONIX) 40 MG tablet,  Take 40 mg by mouth 2 (two) times daily., Disp: , Rfl:  .  Polyethyl Glycol-Propyl Glycol (SYSTANE) 0.4-0.3 % GEL ophthalmic gel, Place 1 application into both eyes as needed (dry eyes)., Disp: , Rfl:  .  polyethylene glycol (MIRALAX / GLYCOLAX) 17 g packet, Take 17 g by mouth daily., Disp: , Rfl:  .  predniSONE (DELTASONE) 5 MG tablet, Take 5 mg by mouth at bedtime. , Disp: , Rfl:  .  predniSONE (DELTASONE) 5 MG tablet, Take 1 tablet by mouth daily., Disp: , Rfl:  .  propranolol (INDERAL) 10 MG tablet, Take 10 mg by mouth 2 (two) times daily., Disp: , Rfl:  .  sertraline (ZOLOFT) 100 MG tablet, Take 100 mg by mouth at bedtime. , Disp: , Rfl:  .  XARELTO 10 MG TABS tablet, Take 10 mg by mouth daily., Disp: , Rfl:    Significant Hospital Events:  12/23/2020 - admit   Consults:  x  Procedures:  12/09/2020 - ETT 12/13/2020  - left tibial intraosseous 12/24/2020  - rt pig tail chest tube by ER for ptx  Significant Diagnostic Tests:      CT Head Wo Contrast and CT C spine  Result Date: 12/20/2020  IMPRESSION: 1. No acute intracranial abnormality.  2. Findings consistent with age related atrophy and chronic small vessel ischemia 3.  No acute fracture or malalignment of the spine. 4. Chronic sinusitis with debris/masslike area within the right maxillary sinus and sphenoid sinus 5. Right apical pneumothorax, better evaluated on CT chest same day Electronically Signed   By: Prudencio Pair M.D.   On: 12/29/2020 22:08   CT CHEST ABDOMEN PELVIS W CONTRAST   IMPRESSION: 1. Moderate to large volume right pneumothorax.  2. Likely bilateral lower lobe aspiration pneumonia. 3. Interval development of a 1.5 cm subpleural and a 2.3 cm ground-glass left upper lobe pulmonary nodules. Adenocarcinoma not excluded. . 4. Associated acute minimally displaced (fifth rib is full shaft with displaced) right rib fractures involving the anterolateral third through eighth ribs. 5. Acute minimally displaced (sixth rib is displaced) left rib fractures involving the anterolateral third through ninth ribs. No definite left pneumothorax. 6. Acute sternal fracture. 7. Fast transition state of the large bowel. 8. Please see separately dictated CT thoracic and lumbar spine 12/29/2020. Electronically Signed   By: Iven Finn M.D.   On: 12/30/2020 22:23   CT T-SPINE NO CHARGE  Result Date: 12/15/2020  1. CT THORACIC SPINE IMPRESSION Acute inferior endplate fracture at T8 with loss of height of about 20%. No retropulsed bone. 2. CT LUMBAR SPINE IMPRESSION Acute superior endplate fracture at L3 with loss of height of 20%. Mild posterior bowing of the posterosuperior margin of L3 related to the fracture. No compressive encroachment upon the canal. No other lumbosacral injury identified, through S3. 3. Other lumbar degenerative changes as outlined above. Aortic Atherosclerosis (ICD10-I70.0). Electronically Signed   By: Nelson Chimes M.D.   On: 12/16/2020 22:05     Micro Data:  x  Antimicrobials:  Empiric unasyn   Interim History / Subjective:   12/20/2020 - seen in [facility]  hospital ER Trauma C  Objective   Blood pressure 98/62, pulse 94, temperature (!) 95 F (35 C), resp. rate 19, height 5\' 10"  (1.778 m), weight 74.8 kg, SpO2 99 %.    Vent Mode: PRVC FiO2 (%):  [50 %-100 %] 50 % Set Rate:  [15 bmp-20 bmp] 20 bmp Vt Set:  [580 mL] 580 mL PEEP:  [5  [place] Pressure:  [14 cmH20] 14 cmH20   Intake/Output Summary (Last 24 hours) at 12/13/2020 2312 Last data filed at 12/15/2020 2254 Gross per 24 hour  Intake 3000 ml  Output -  Net 3000 ml   Filed Weights   12/21/2020 2235  Weight: 74.8 kg    Examination: General: Elderly male on the ventilator HENT: ET tube and OG tube present Lungs: Right-sided chest tube just placed by the EDP.  Clear to auscultation bilaterally Cardiovascular: Regular rate and rhythm.  Normotensive at this point. Abdomen: Soft.  Has a mild red bruise running transversely with the inferior point towards the right iliac crest in the superior point towards the left upper quadrant.  Looks like a seatbelt but apparently this is where he was slouched over the chair Extremities: Intact Neuro: Unresponsive without any sedation gag present GU: Not examined  Resolved Hospital Problem list   x  Assessment & Plan:  ASSESSMENT / PLAN: Acute respiratory failure in the setting of PEA arrest 12/17/2020  Post CPR discovery of large right [tension] pneumothorax associated with bilateral rib fractures and sternal fractures -status post pigtail tube thoracostomy by EDP  Bilateral lower lobe consolidation upon arrival to the ER 12/08/2020 raising concern for infectious pneumonia or aspiration  Left upper lobe mass 1.2 cm groundglass opacity at admission 12/09/2020  P:   Full ventilator support Right-sided chest tube pigtail catheter  Management Ventilator associated pneumonia bundle OPD follow-up for the mass if he survives    A:   Comatose post cardiac arrest.  Initial CT head 12/31/2020 without evidence of anoxia  P:    Normothermia protocol Neuro prognosticate in 3-5 days  Check spot EEG 01/02/2021   A:   Post cardiac arrest circulatory shock resolved with fluids and right-sided chest tube  P:  Mean arterial pressure goal greater than 65-85   A: PEA arrest 12/14/2020 -primary indication for admission History of coronary artery disease status post angioplasty in 2017 by Dr. Einar Gip  P: Check troponin serial Check echo Depending on course might need cardiology consult   A: No evidence of V. fib/V. tach at admission.  Remains at high risk for cardiac arrhythmias   P: Telemetry monitoring   A:   Covid negative.  CT chest with bilateral lower lobe consolidation and CT sinus with evidence of sinusitis P:   Check procalcitonin Check lactic acid Check urine strep and urine Legionella Empiric Unasyn   A:  CKD with a baseline creatinine of 1.5/1.75 in the summer 2020 AKI creatinine 2.42 mg percent versus progressive CKD at admission 12/18/2020   P:  Fluid bolus and monitor  A: Severe metabolic acidosis at admission likely due to CPR  Plan -Repeat ABG and if bicarb is low -start bicarb infusion    A:  Hyperkalemia at admission resolved in the ED P: Monitor electrolytes closely Magnesium goal greater than 2 Potassium goal between 4-5 Phosphorus goal is normal    A:   At risk for stress ulcer bleed Transaminitis - likely shock liver  P:   PPI OG tube Track LFT   A:  Anemia admission that is gotten worse.  No clear-cut evidence of bleed   P:  - PRBC for hgb </= 6.9gm%    - exceptions are   -  if ACS susepcted/confirmed then transfuse for hgb </= 8.0gm%,  or    -  active bleeding with hemodynamic instability, then transfuse regardless of hemoglobin value   At at all times try  to transfuse 1 unit prbc as possible with exception of active hemorrhage    A Thrombocytosis at admission 12/17/2020 but is asked to risk for thrombocytopenia  P Hold off anticoagulation  given multiple polytrauma fractures   A:   At risk for hypo and hyperglycemia P:   SSI  A Hx of rheumatoid arthritis Sternal fracture, L3 fracture, T8 fracture and bilateral rib fracture -?  Due to CPR INtraosseous line placed 12/13/2020  Plan - Hard collar -Monitor - IO to be removed by 01/27/2021   Best practice (evaluated daily)  Diet: npo but will need TF Pain/Anxiety/Delirium protocol (if indicated): per protocol VAP protocol (if indicated): yes DVT prophylaxis: scd -> anticog once bleed risk subside GI prophylaxis: ppi Glucose control: ssi Mobility: bed rest Disposition:from ER to ICU  Goals of Care:   Multi-Disciplinary Goals of Care Discussion -pending   Family Updates:  Alfredo Spong [phone] updaetd 11:54 PM  - says patient would not want to be on life support. Son and his brother discussed -> partial code now but regardngn terminal wean v continued active care my advise was to see what neuro course looks like which is few to several days. Prognosis in very poor     ATTESTATION & SIGNATURE   The patient KHRISTIAN PHILLIPPI is critically ill with multiple organ systems failure and requires high complexity decision making for assessment and support, frequent evaluation and titration of therapies, application of advanced monitoring technologies and extensive interpretation of multiple databases.   Critical Care Time devoted to patient care services described in this note is  60  Minutes. This time reflects time of care of this signee Dr Brand Males. This critical care time does not reflect procedure time, or teaching time or supervisory time of PA/NP/Med student/Med Resident etc but could involve care discussion time     Dr. Brand Males, M.D., [facility].C.P Pulmonary and Critical Care Medicine Staff Physician [place] [facility] Pager: [phone], If no answer or between  15:00h - 7:00h: call [phone]   [phone]  12/10/2020 11:12 PM    LABS    PULMONARY Recent Labs  Lab 12/22/2020 2149  PHART 7.183*  PCO2ART 33.1  PO2ART 411*  HCO3 12.6*  TCO2 14*  O2SAT 100.0    CBC Recent Labs  Lab 12/20/2020 2104 12/21/2020 2149  HGB 12.9* 9.5*  HCT 42.7 28.0*  WBC 16.5*  --   PLT 424*  --     COAGULATION Recent Labs  Lab 12/25/2020 2104  INR 1.6*    CARDIAC  No results for input(s): TROPONINI in the last 168 hours. No results for input(s): PROBNP in the last 168 hours.   CHEMISTRY Recent Labs  Lab 12/12/2020 2104 12/14/2020 2149  NA 138 136  K 5.2* 3.8  CL 100  --   CO2 11*  --   GLUCOSE 253*  --   BUN 18  --   CREATININE 2.42*  --   CALCIUM 9.1  --    Estimated Creatinine Clearance: 25.6 mL/min (A) (by C-G formula based on SCr of 2.42 mg/dL (H)).   LIVER Recent Labs  Lab 12/27/2020 2104  AST 1,022*  ALT 374*  ALKPHOS 94  BILITOT 2.2*  PROT 6.5  ALBUMIN 2.4*  INR 1.6*     INFECTIOUS Recent Labs  Lab 12/25/2020 2104  LATICACIDVEN >11.0*     ENDOCRINE CBG (last 3)  Recent Labs    12/23/2020  2057 01/04/2021 2205  GLUCAP 64* 177*         IMAGING x48h  - image(s) personally visualized  -   highlighted in bold CT Head Wo Contrast  Result Date: 12/17/2020 CLINICAL DATA:  Change in mental status cardiac arrest EXAM: CT HEAD WITHOUT CONTRAST TECHNIQUE: Contiguous axial images were obtained from the base of the skull through the vertex without intravenous contrast. COMPARISON:  MRI October 04, 2017 FINDINGS: Brain: No evidence of acute territorial infarction, hemorrhage, hydrocephalus,extra-axial collection or mass lesion/mass effect. There is dilatation the ventricles and sulci consistent with age-related atrophy. Low-attenuation changes in the deep white matter consistent with small vessel ischemia. Vascular: No hyperdense vessel or unexpected calcification. Skull: The skull is intact. No fracture or focal lesion identified. Sinuses/Orbits: There is near  complete opacification of the right maxillary sinus and sphenoid sinus with remodeling the maxillary sinus with cortical breakthrough into the ethmoid air cells hyperdense rounded area of masslike secretions/debris as on prior MRI. The orbits and globes intact. Other: None Cervical spine: Alignment: Physiologic Skull base and vertebrae: Visualized skull base is intact. No atlanto-occipital dissociation. The vertebral body heights are well maintained. No fracture or pathologic osseous lesion seen. Soft tissues and spinal canal: The visualized paraspinal soft tissues are unremarkable. No prevertebral soft tissue swelling is seen. The spinal canal is grossly unremarkable, no large epidural collection or significant canal narrowing. Partially visualized ET tube is seen. There is also debris seen within the upper oropharynx. Disc levels: Multilevel cervical spine spondylosis seen with disc osteophyte complex and uncovertebral osteophytes most notable at C3-C4 and C4-C5 with mild narrowing of the central canal and right neural foraminal narrowing. Upper chest: A right apical pneumothorax is present. Subpleural bleb formation seen within the bilateral lung apices. Other: None IMPRESSION: 1. No acute intracranial abnormality. 2. Findings consistent with age related atrophy and chronic small vessel ischemia 3.  No acute fracture or malalignment of the spine. 4. Chronic sinusitis with debris/masslike area within the right maxillary sinus and sphenoid sinus 5. Right apical pneumothorax, better evaluated on CT chest same day Electronically Signed   By: Prudencio Pair M.D.   On: 12/24/2020 22:08   CT Cervical Spine Wo Contrast  Result Date: 12/19/2020 CLINICAL DATA:  Change in mental status cardiac arrest EXAM: CT HEAD WITHOUT CONTRAST TECHNIQUE: Contiguous axial images were obtained from the base of the skull through the vertex without intravenous contrast. COMPARISON:  MRI October 04, 2017 FINDINGS: Brain: No evidence of  acute territorial infarction, hemorrhage, hydrocephalus,extra-axial collection or mass lesion/mass effect. There is dilatation the ventricles and sulci consistent with age-related atrophy. Low-attenuation changes in the deep white matter consistent with small vessel ischemia. Vascular: No hyperdense vessel or unexpected calcification. Skull: The skull is intact. No fracture or focal lesion identified. Sinuses/Orbits: There is near complete opacification of the right maxillary sinus and sphenoid sinus with remodeling the maxillary sinus with cortical breakthrough into the ethmoid air cells hyperdense rounded area of masslike secretions/debris as on prior MRI. The orbits and globes intact. Other: None Cervical spine: Alignment: Physiologic Skull base and vertebrae: Visualized skull base is intact. No atlanto-occipital dissociation. The vertebral body heights are well maintained. No fracture or pathologic osseous lesion seen. Soft tissues and spinal canal: The visualized paraspinal soft tissues are unremarkable. No prevertebral soft tissue swelling is seen. The spinal canal is grossly unremarkable, no large epidural collection or significant canal narrowing. Partially visualized ET tube is seen. There is also debris seen within the upper oropharynx.  Disc levels: Multilevel cervical spine spondylosis seen with disc osteophyte complex and uncovertebral osteophytes most notable at C3-C4 and C4-C5 with mild narrowing of the central canal and right neural foraminal narrowing. Upper chest: A right apical pneumothorax is present. Subpleural bleb formation seen within the bilateral lung apices. Other: None IMPRESSION: 1. No acute intracranial abnormality. 2. Findings consistent with age related atrophy and chronic small vessel ischemia 3.  No acute fracture or malalignment of the spine. 4. Chronic sinusitis with debris/masslike area within the right maxillary sinus and sphenoid sinus 5. Right apical pneumothorax, better  evaluated on CT chest same day Electronically Signed   By: Prudencio Pair M.D.   On: 12/26/2020 22:08   DG Pelvis Portable  Result Date: 12/14/2020 CLINICAL DATA:  Post arrest cardiac. Found slumped over and rocking chair. EXAM: PORTABLE PELVIS 1-2 VIEWS COMPARISON:  X-ray hip 12/22/2012 FINDINGS: Similar appearance of 3 surgical screws status post fixation of a left femoral head fracture. There is no evidence of pelvic fracture or diastasis. No pelvic bone lesions are seen. Degenerative changes of the visualized lower lumbar spine. Limited evaluation of the sacrum due to overlying soft tissues and bowel. Similar-appearing slight sclerosis of the right sacroiliac joint. Foley temperature probe overlying the pelvis. IMPRESSION: Negative. Electronically Signed   By: Iven Finn M.D.   On: 12/15/2020 21:30   CT CHEST ABDOMEN PELVIS W CONTRAST  Result Date: 12/25/2020 CLINICAL DATA:  cardiac arrest. Pt lives alone, neighbor found slumped over a rocking chair EXAM: CT CHEST, ABDOMEN, AND PELVIS WITH CONTRAST TECHNIQUE: Multidetector CT imaging of the chest, abdomen and pelvis was performed following the standard protocol during bolus administration of intravenous contrast. CONTRAST:  166mL OMNIPAQUE IOHEXOL 300 MG/ML  SOLN COMPARISON:  CT angio chest, abdomen, pelvis 03/19/2019. FINDINGS: Ports and Devices: Endotracheal tube terminates 3.5 cm above the carina. Foley catheter terminates within the urinary bladder lumen with several associated intraluminal foci of gas likely due to instrumentation. CHEST: Lungs/airways: Bilateral centrilobular and and paraseptal emphysematous changes. Bilateral lower lobe dependent lower lobe consolidations/atelectasis. There is a poorly defined 2.3 cm ground-glass left upper lobe pulmonary nodule (5:65-69). A 1.6 cm left upper lobe subpleural ground-glass pulmonary nodule (5:44). Suggestion of an associated less conspicuous logical within the left upper lobe more superiorly  (5:37). There is a stable 0.8 x 0.4 cm pulmonary nodule within the right upper lobe (5:57). Other previously identified right upper lobe nodules are poorly visualized likely due to collapse the lung. Right lung calcified granuloma. No pulmonary laceration.  No pneumatocele formation. The central airways are patent. Pleura: No pleural effusion. Moderate to large volume right pneumothorax. No left pneumothorax. No hemothorax. Lymph Nodes: Calcified right hilar lymph nodes. No mediastinal, hilar, or axillary lymphadenopathy. Mediastinum: No pneumomediastinum. No aortic injury or mediastinal hematoma. The thoracic aorta is normal in caliber. Atherosclerotic plaque. Coronary artery calcifications. The heart is normal in size. No significant pericardial effusion. The esophagus is unremarkable. The thyroid is not visualized. Chest Wall / Breasts: No chest wall mass. Musculoskeletal: Nondisplaced acute fracture of the sternum in a couple different locations anteriorly (7:82). Acute minimally displaced (fifth rib is full shaft with displaced) right rib fractures involving the anterolateral third through eighth ribs. Acute minimally displaced (sixth rib is displaced) left rib fractures involving the anterolateral third through ninth ribs. Please see separately dictated CT thoracic spine 12/10/2020. ABDOMEN / PELVIS: Liver: Not enlarged. No focal lesion. No laceration or subcapsular hematoma. Biliary System: The gallbladder is otherwise unremarkable with no radio-opaque  gallstones. No biliary ductal dilatation. Pancreas: Normal pancreatic contour. No main pancreatic duct dilatation. Spleen: Not enlarged. No focal lesion. No laceration, subcapsular hematoma, or vascular injury. Adrenal Glands: No nodularity bilaterally. Kidneys: Bilateral kidneys enhance symmetrically. No hydronephrosis. No contusion, laceration, or subcapsular hematoma. No injury to the vascular structures or collecting systems. No hydroureter. The urinary  bladder is unremarkable. Bowel: Fluid within the lumen of the large bowel is noted. No pneumatosis. No small or large bowel wall thickening or dilatation. The appendix is unremarkable. Mesentery, Omentum, and Peritoneum: No simple free fluid ascites. No pneumoperitoneum. No hemoperitoneum. No mesenteric hematoma identified. No organized fluid collection. Pelvic Organs: Normal. Lymph Nodes: No abdominal, pelvic, inguinal lymphadenopathy. Vasculature: Atherosclerotic plaque. No abdominal aorta or iliac aneurysm. No active contrast extravasation or pseudoaneurysm. Musculoskeletal: No significant soft tissue hematoma. No acute pelvic fracture. Fixation of the left femoral neck with 3 screws. Please see separately dictated CT lumbar spine 12/22/2020. IMPRESSION: 1. Moderate to large volume right pneumothorax. Per chest x-ray 12/10/2020, findings reported to team at 9:30 p.m. 2. Likely bilateral lower lobe aspiration pneumonia. 3. Interval development of a 1.5 cm subpleural and a 2.3 cm ground-glass left upper lobe pulmonary nodules. Adenocarcinoma not excluded. Additional imaging evaluation or consultation with Pulmonology or Thoracic Surgery recommended. 4. Associated acute minimally displaced (fifth rib is full shaft with displaced) right rib fractures involving the anterolateral third through eighth ribs. 5. Acute minimally displaced (sixth rib is displaced) left rib fractures involving the anterolateral third through ninth ribs. No definite left pneumothorax. 6. Acute sternal fracture. 7. Fast transition state of the large bowel. 8. Please see separately dictated CT thoracic and lumbar spine 12/20/2020. Electronically Signed   By: Iven Finn M.D.   On: 12/14/2020 22:23   CT T-SPINE NO CHARGE  Result Date: 12/11/2020 CLINICAL DATA:  Possible fall with back trauma. EXAM: CT THORACIC AND LUMBAR SPINE WITHOUT CONTRAST TECHNIQUE: Multidetector CT imaging of the thoracic and lumbar spine was performed without  contrast. Multiplanar CT image reconstructions were also generated. COMPARISON:  CT chest 07/03/2019 FINDINGS: CT THORACIC SPINE FINDINGS Alignment: Mild scoliotic curvature convex to the right. No antero or retrolisthesis. Vertebrae: Acute inferior endplate fracture at T8 with loss of height of about 20%. No retropulsed bone. No other acute fracture in the region. Mild chronic loss of height at the inferior endplate regions of T6 and T7. Paraspinal and other soft tissues: See results of chest CT. Disc levels: No significant disc space pathology. No stenosis of the canal or foramina. Ordinary mild facet osteoarthritis. CT LUMBAR SPINE FINDINGS Segmentation: S1 is transitional, largely lumbarized. Alignment: Normal Vertebrae: Acute superior endplate fracture L3 with loss of height of 20%. Mild posterior bowing of the posterosuperior margin of the vertebral body. No compressive encroachment upon the canal. No other lumbosacral injury identified, through S3. Paraspinal and other soft tissues: Aortic atherosclerosis. No other retroperitoneal finding. Disc levels: L1-2: Minimal noncompressive disc bulge. L2-3: Mild bulging of the disc. Mild posterior bowing of the posterosuperior margin of L3 related to the fracture. No apparent compressive stenosis. L3-4: Mild bulging of the disc. Mild bilateral lateral recess narrowing. L4-5: Mild bulging of the disc. Mild bilateral lateral recess narrowing. L5-S1: Bulging of the disc. Facet hypertrophy. Mild to moderate stenosis of both lateral recesses. S1-2: Transitional level. Facet hypertrophy. Mild narrowing of the subarticular lateral recess on the right because of osteophytic encroachment. IMPRESSION: 1. CT THORACIC SPINE IMPRESSION Acute inferior endplate fracture at T8 with loss of height of about 20%.  No retropulsed bone. 2. CT LUMBAR SPINE IMPRESSION Acute superior endplate fracture at L3 with loss of height of 20%. Mild posterior bowing of the posterosuperior margin of L3  related to the fracture. No compressive encroachment upon the canal. No other lumbosacral injury identified, through S3. 3. Other lumbar degenerative changes as outlined above. Aortic Atherosclerosis (ICD10-I70.0). Electronically Signed   By: Nelson Chimes M.D.   On: 12/29/2020 22:05   CT L-SPINE NO CHARGE  Result Date: 12/16/2020 CLINICAL DATA:  Possible fall with back trauma. EXAM: CT THORACIC AND LUMBAR SPINE WITHOUT CONTRAST TECHNIQUE: Multidetector CT imaging of the thoracic and lumbar spine was performed without contrast. Multiplanar CT image reconstructions were also generated. COMPARISON:  CT chest 07/03/2019 FINDINGS: CT THORACIC SPINE FINDINGS Alignment: Mild scoliotic curvature convex to the right. No antero or retrolisthesis. Vertebrae: Acute inferior endplate fracture at T8 with loss of height of about 20%. No retropulsed bone. No other acute fracture in the region. Mild chronic loss of height at the inferior endplate regions of T6 and T7. Paraspinal and other soft tissues: See results of chest CT. Disc levels: No significant disc space pathology. No stenosis of the canal or foramina. Ordinary mild facet osteoarthritis. CT LUMBAR SPINE FINDINGS Segmentation: S1 is transitional, largely lumbarized. Alignment: Normal Vertebrae: Acute superior endplate fracture L3 with loss of height of 20%. Mild posterior bowing of the posterosuperior margin of the vertebral body. No compressive encroachment upon the canal. No other lumbosacral injury identified, through S3. Paraspinal and other soft tissues: Aortic atherosclerosis. No other retroperitoneal finding. Disc levels: L1-2: Minimal noncompressive disc bulge. L2-3: Mild bulging of the disc. Mild posterior bowing of the posterosuperior margin of L3 related to the fracture. No apparent compressive stenosis. L3-4: Mild bulging of the disc. Mild bilateral lateral recess narrowing. L4-5: Mild bulging of the disc. Mild bilateral lateral recess narrowing. L5-S1:  Bulging of the disc. Facet hypertrophy. Mild to moderate stenosis of both lateral recesses. S1-2: Transitional level. Facet hypertrophy. Mild narrowing of the subarticular lateral recess on the right because of osteophytic encroachment. IMPRESSION: 1. CT THORACIC SPINE IMPRESSION Acute inferior endplate fracture at T8 with loss of height of about 20%. No retropulsed bone. 2. CT LUMBAR SPINE IMPRESSION Acute superior endplate fracture at L3 with loss of height of 20%. Mild posterior bowing of the posterosuperior margin of L3 related to the fracture. No compressive encroachment upon the canal. No other lumbosacral injury identified, through S3. 3. Other lumbar degenerative changes as outlined above. Aortic Atherosclerosis (ICD10-I70.0). Electronically Signed   By: Nelson Chimes M.D.   On: 12/25/2020 22:05   DG Chest Port 1 View  Result Date: 12/14/2020 CLINICAL DATA:  Status post cardiac arrest. EXAM: PORTABLE CHEST 1 VIEW COMPARISON:  Mar 26, 2013. FINDINGS: The heart size and mediastinal contours are within normal limits. Endotracheal tube is in good position. Left lung is clear. Moderate size right apical and basilar pneumothorax is noted. No pleural effusion is noted. The visualized skeletal structures are unremarkable. IMPRESSION: Moderate size right apical and basilar pneumothorax. Critical Value/emergent results were called by telephone at the time of interpretation on 12/16/2020 at 9:30 pm to provider Eliott Nine, RN, who verbally acknowledged these results. Electronically Signed   By: Marijo Conception M.D.   On: 12/18/2020 21:30

## 2021-01-01 NOTE — Progress Notes (Signed)
RT NOTE:  Critical ABG reported to Dr. Dina Rich.

## 2021-01-01 NOTE — Progress Notes (Signed)
RT NOTE:  Pt transported to CT and back without event.

## 2021-01-01 NOTE — ED Triage Notes (Addendum)
Pt from home by EMS, cardiac arrest. Pt lives alone, neighbor found slumped over a rocking chair. Possibly ~30 min downtime, Initially PEA, 10-56min CPR then ROSC. Started on epi gtt with EMS for hypotension, d/c on arrival

## 2021-01-01 NOTE — H&P (Addendum)
NAME:  Gary [name], MRN:  [id number], DOB:  [birth year], LOS: 0 ADMISSION DATE:  12/29/2020, CONSULTATION DATE: 12/24/2020 REFERRING MD: EDP at [facility], CHIEF COMPLAINT: PEA arrest unknown downtime  Brief History:  Pea arrest  History of Present Illness: History from EDP  80 year old male who apparently lives alone but is quite functional.  Unknown downtime but apparently son spoke to him 1 day before admission on 12/31/2020.  Neighbor saw door was locked at 6.54pm 12/11/2020 but an hour later 7.50pm door was open and was found slumped over a rocking chair unknown downtime but possibly max 60 minutes.  PEA upon EMS arrival.  CPR x3 with epi x3.  No shock.  Upon arrival into the ER was hypotensive.  Significant evidence of trauma to the sternum and bilateral ribs and also L3 and T8 spine with evidence of sinusitis and new left upper lobe mass 2.3 cm groundglass opacity and bruising in the area of the abdomen where he was slumped over.  No history of trauma or fall available.  His blood pressure improved upon EDP placing right-sided pigtail chest tube.  Largely unresponsive except did have a gag reflex when ER nurse placed OG tube.  CCM called for admission.  Son is aware of the admission and talking to family about CODE STATUS.  Labs on admission showed possible AKI and anemia and hyperkalemia  He appears to have a history of rheumatoid arthritis, radiation therapy, coronary artery disease with status post angioplasty in 2017 by Dr. 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   reports that he quit smoking about 37 years ago. His smoking use included cigarettes. He has a 27.00 pack-year smoking history. He has never used smokeless tobacco.  Past Surgical History:  Procedure Laterality Date  . CARDIAC CATHETERIZATION N/A 09/13/2016   Procedure: Left Heart Cath and Coronary Angiography;  Surgeon: Adrian Prows, MD;  Location: [facility];  Service: Cardiovascular;  Laterality: N/A;  . CARDIAC CATHETERIZATION N/A 10/11/2016   Procedure: Coronary Stent Intervention;  Surgeon: Adrian Prows, MD;  Location: [facility];  Service: Cardiovascular;  Laterality: N/A;  LAD & 1st Diag  . CATARACT EXTRACTION W/ INTRAOCULAR LENS  IMPLANT, BILATERAL Bilateral 2007  . CHOLECYSTECTOMY N/A 03/20/2019   Procedure: LAPAROSCOPIC CHOLECYSTECTOMY;  Surgeon: Clovis Riley, MD;  Location: [facility];  Service: General;  Laterality: N/A;  . CORONARY ANGIOPLASTY  09/13/2016  . CORONARY ANGIOPLASTY WITH STENT PLACEMENT  2002  . FRACTURE SURGERY    . HIP PINNING,CANNULATED Left 12/22/2012   Procedure: CANNULATED HIP PINNING;  Surgeon: Mcarthur Rossetti, MD;  Location: [facility];  Service: Orthopedics;  Laterality: Left;  [name] MULTIPLE TOOTH EXTRACTIONS  ~ 1962   Edentulous  . RADICAL NECK DISSECTION Right 01/22/2013   [facility]  . SKIN CANCER EXCISION     "face, ears"  . SKIN GRAFT Left ~ 1961   "grafted skin off my stomach & put it on left hand that I had caught in fan belt"  . TONSILLECTOMY      Allergies  Allergen Reactions  . Bee Venom Swelling  .  Cellcept [Mycophenolate Mofetil] Other (See Comments)    Reaction unknown  . Colcrys [Colchicine] Other (See Comments)    myalgias  . Erythromycin Other (See Comments)    Reaction unknown  . Methotrexate Derivatives Hives, Itching and Rash    Only in tablet form  . Neosporin [Neomycin-Bacitracin Zn-Polymyx] Rash  . Trazodone Palpitations     Weakness, loss of appetite  . Ultram [Tramadol] Rash    Immunization History  Administered Date(s) Administered  . Influenza Whole 08/07/2012  . Influenza, High Dose Seasonal PF 09/03/2013, 07/09/2019  . Influenza-Unspecified 09/07/2018  . Zoster Recombinat (Shingrix) 04/03/2019, 06/19/2019    Family History  Problem Relation Age of Onset  . Coronary artery disease Brother 26  . Heart attack Brother   . Coronary artery disease Father        stents in 70's  . Cancer Mother        bone     Current Facility-Administered Medications:  .  0.9 %  sodium chloride infusion, , Intravenous, Continuous PRN, Horton, Kristie M, DO, Stopped at 12/31/2020 2254 .  lidocaine (PF) (XYLOCAINE) 1 % injection, , , ,   Current Outpatient Medications:  .  acyclovir (ZOVIRAX) 400 MG tablet, Take 400 mg by mouth 2 (two) times daily. , Disp: , Rfl:  .  atorvastatin (LIPITOR) 40 MG tablet, Take 40 mg by mouth 2 (two) times daily. , Disp: , Rfl:  .  Calcium Carbonate-Vitamin D (CALCIUM-D) 600-400 MG-UNIT TABS, Take 1 tablet by mouth daily. , Disp: , Rfl:  .  Cholecalciferol (VITAMIN D3) 50 MCG (2000 UT) capsule, Take 2,000 Units by mouth daily., Disp: , Rfl:  .  clobetasol (TEMOVATE) 0.05 % external solution, Apply 1 application topically as needed. psoriasis on head, Disp: , Rfl:  .  diazepam (VALIUM) 5 MG tablet, Take 1 tablet (5 mg total) by mouth every 12 (twelve) hours as needed for anxiety. For anxiety (Patient taking differently: Take 5 mg by mouth at bedtime as needed (sleep). For anxiety), Disp: 30 tablet, Rfl: 0 .  EPINEPHrine 0.3 mg/0.3 mL IJ SOAJ injection, Inject 0.3 mg into the muscle as directed. , Disp: , Rfl:  .  ferrous sulfate 220 (44 Fe) MG/5ML solution, , Disp: , Rfl:  .  Glucosamine 500 MG CAPS, Take 1 capsule by mouth daily., Disp: , Rfl:  .  lactose free nutrition (BOOST) LIQD, Take 237 mLs by mouth 3 (three) times daily between meals. As needed, Disp: , Rfl:  .  levothyroxine  (SYNTHROID, LEVOTHROID) 112 MCG tablet, Take 112 mcg daily before breakfast by mouth., Disp: , Rfl:  .  lisinopril (ZESTRIL) 5 MG tablet, Take 5 mg by mouth daily., Disp: , Rfl:  .  magnesium oxide (MAG-OX) 400 MG tablet, Take 400 mg by mouth at bedtime., Disp: , Rfl:  .  meclizine (ANTIVERT) 25 MG tablet, Take 25 mg by mouth every 12 (twelve) hours as needed for dizziness. , Disp: , Rfl:  .  mirtazapine (REMERON) 15 MG tablet, Take 7.5 mg by mouth at bedtime. , Disp: , Rfl:  .  Multiple Vitamin (MULTIVITAMIN WITH MINERALS) TABS tablet, Take 1 tablet by mouth daily., Disp: , Rfl:  .  nitroGLYCERIN (NITROSTAT) 0.4 MG SL tablet, TAKE 1 TABLET SUBLINGUALLY EVERY 5 MINUTES AS NEEDED FOR CHEST PAIN UP TO 3 DOSES THEN CALL 911, Disp: , Rfl:  .  Omega-3 1000 MG CAPS, Take 2 capsules by mouth daily., Disp: , Rfl:  .  pantoprazole (PROTONIX) 40 MG tablet,  Take 40 mg by mouth 2 (two) times daily., Disp: , Rfl:  .  Polyethyl Glycol-Propyl Glycol (SYSTANE) 0.4-0.3 % GEL ophthalmic gel, Place 1 application into both eyes as needed (dry eyes)., Disp: , Rfl:  .  polyethylene glycol (MIRALAX / GLYCOLAX) 17 g packet, Take 17 g by mouth daily., Disp: , Rfl:  .  predniSONE (DELTASONE) 5 MG tablet, Take 5 mg by mouth at bedtime. , Disp: , Rfl:  .  predniSONE (DELTASONE) 5 MG tablet, Take 1 tablet by mouth daily., Disp: , Rfl:  .  propranolol (INDERAL) 10 MG tablet, Take 10 mg by mouth 2 (two) times daily., Disp: , Rfl:  .  sertraline (ZOLOFT) 100 MG tablet, Take 100 mg by mouth at bedtime. , Disp: , Rfl:  .  XARELTO 10 MG TABS tablet, Take 10 mg by mouth daily., Disp: , Rfl:    Significant Hospital Events:  12/14/2020 - admit   Consults:  x  Procedures:  12/23/2020 - ETT 12/30/2020  - left tibial intraosseous 12/23/2020  - rt pig tail chest tube by ER for ptx  Significant Diagnostic Tests:      CT Head Wo Contrast and CT C spine  Result Date: 12/26/2020  IMPRESSION: 1. No acute intracranial abnormality.  2. Findings consistent with age related atrophy and chronic small vessel ischemia 3.  No acute fracture or malalignment of the spine. 4. Chronic sinusitis with debris/masslike area within the right maxillary sinus and sphenoid sinus 5. Right apical pneumothorax, better evaluated on CT chest same day Electronically Signed   By: Prudencio Pair M.D.   On: 12/25/2020 22:08   CT CHEST ABDOMEN PELVIS W CONTRAST   IMPRESSION: 1. Moderate to large volume right pneumothorax.  2. Likely bilateral lower lobe aspiration pneumonia. 3. Interval development of a 1.5 cm subpleural and a 2.3 cm ground-glass left upper lobe pulmonary nodules. Adenocarcinoma not excluded. . 4. Associated acute minimally displaced (fifth rib is full shaft with displaced) right rib fractures involving the anterolateral third through eighth ribs. 5. Acute minimally displaced (sixth rib is displaced) left rib fractures involving the anterolateral third through ninth ribs. No definite left pneumothorax. 6. Acute sternal fracture. 7. Fast transition state of the large bowel. 8. Please see separately dictated CT thoracic and lumbar spine 12/20/2020. Electronically Signed   By: Iven Finn M.D.   On: 12/11/2020 22:23   CT T-SPINE NO CHARGE  Result Date: 12/13/2020  1. CT THORACIC SPINE IMPRESSION Acute inferior endplate fracture at T8 with loss of height of about 20%. No retropulsed bone. 2. CT LUMBAR SPINE IMPRESSION Acute superior endplate fracture at L3 with loss of height of 20%. Mild posterior bowing of the posterosuperior margin of L3 related to the fracture. No compressive encroachment upon the canal. No other lumbosacral injury identified, through S3. 3. Other lumbar degenerative changes as outlined above. Aortic Atherosclerosis (ICD10-I70.0). Electronically Signed   By: Nelson Chimes M.D.   On: 12/13/2020 22:05     Micro Data:  x  Antimicrobials:  Empiric unasyn   Interim History / Subjective:   12/24/2020 - seen in [facility]  hospital ER Trauma C  Objective   Blood pressure 98/62, pulse 94, temperature (!) 95 F (35 C), resp. rate 19, height 5\' 10"  (1.778 m), weight 74.8 kg, SpO2 99 %.    Vent Mode: PRVC FiO2 (%):  [50 %-100 %] 50 % Set Rate:  [15 bmp-20 bmp] 20 bmp Vt Set:  [580 mL] 580 mL PEEP:  [5  [place] Pressure:  [14 cmH20] 14 cmH20   Intake/Output Summary (Last 24 hours) at 12/14/2020 2312 Last data filed at 12/19/2020 2254 Gross per 24 hour  Intake 3000 ml  Output --  Net 3000 ml   Filed Weights   12/15/2020 2235  Weight: 74.8 kg    Examination: General: Elderly male on the ventilator HENT: ET tube and OG tube present Lungs: Right-sided chest tube just placed by the EDP.  Clear to auscultation bilaterally Cardiovascular: Regular rate and rhythm.  Normotensive at this point. Abdomen: Soft.  Has a mild red bruise running transversely with the inferior point towards the right iliac crest in the superior point towards the left upper quadrant.  Looks like a seatbelt but apparently this is where he was slouched over the chair Extremities: Intact Neuro: Unresponsive without any sedation gag present GU: Not examined   ICD-10 prob list   Patient Active Problem List   Diagnosis Date Noted  . Limited code status, all resuscitation measures excluding chest compressions 01/02/2021  . PEA (Pulseless electrical activity) ([place]) 01/02/2021  . Poor prognosis 01/02/2021  . Goals of care, counseling/discussion 01/02/2021  . AKI (acute kidney injury) ([place]) 01/02/2021  . Acute respiratory failure ([place]) 01/02/2021  . Pneumothorax, right 01/02/2021  . Iron deficiency anemia 05/24/2019  . Acute cholecystitis 03/19/2019  . Post PTCA 09/13/2016  . Exposed mandible 05/20/2014  . . 03/26/2013  . COPD with acute exacerbation ([place]) 03/26/2013  . Pulmonary nodule 01/06/2013  . Diastolic dysfunction, with good LVF 2D Feb 2014 12/25/2012  . Chronic anticoagulation, new Feb 2014- Xarelto 12/25/2012   . PAF, new Feb 2014 with SSS component 12/24/2012  . 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 Hip fracture, left, S/P surgical repair 12/22/12 12/21/2012  . Coronary artery disease   . HTN (hypertension)   . Anxiety   . Sleep apnea   . Chronic kidney disease. stage 2   . GERD (gastroesophageal reflux disease)   . RA (rheumatoid arthritis) ([place])   . Gout   . Anemia   . Hepatitis   . Chronic back pain   . Dyslipidemia   . Hypothyroid   . CAD, LAD stent 2002       Resolved Hospital Problem list   x  Assessment & Plan:  ASSESSMENT / PLAN: Acute respiratory failure in the setting of PEA arrest 12/13/2020  Post CPR discovery of large right [tension] pneumothorax associated with bilateral rib fractures and sternal fractures -status post pigtail tube thoracostomy by EDP  Bilateral lower lobe consolidation upon arrival to the ER 12/31/2020 raising concern for infectious pneumonia or aspiration  Left upper lobe mass 1.2 cm groundglass opacity at admission 12/23/2020 - RA nodule v lung cancer  P:   Full ventilator support Right-sided chest tube pigtail catheter  Management Ventilator associated pneumonia bundle OPD follow-up for the mass if he survives    A:   Comatose post cardiac arrest.  Initial CT head 12/25/2020 without evidence of anoxia  P:   Normothermia protocol Neuro prognosticate in 3-5 days  Check spot EEG 01/02/2021   A:   Post cardiac arrest circulatory shock resolved with fluids and right-sided chest tube  P:  Mean arterial pressure goal greater than 65-85   A: PEA arrest 12/16/2020 -primary indication for admission History of coronary artery disease status post angioplasty in 2017 by Dr. Einar Gip  P: Check troponin serial Check echo Depending on course might need cardiology consult   A: No  evidence of V. fib/V. tach at admission.  Remains at high risk for cardiac arrhythmias   P: Telemetry monitoring   A:   Covid negative.  CT  chest with bilateral lower lobe consolidation and CT sinus with evidence of sinusitis P:   Check procalcitonin Check lactic acid Check urine strep and urine Legionella Empiric Unasyn   A:  Home ace inhibito r intake CKD with a baseline creatinine of 1.5/1.75 in the summer 2020 AKI creatinine 2.42 mg percent versus progressive CKD at admission 12/10/2020   P:  Fluid bolus and monitor No ace inhibigtor  A: Severe metabolic acidosis at admission likely due to CPR  Plan -Repeat ABG and if bicarb is low -start bicarb infusion    A:  Hyperkalemia at admission resolved in the ED P: Monitor electrolytes closely Magnesium goal greater than 2 Potassium goal between 4-5 Phosphorus goal is normal    A:   At risk for stress ulcer bleed Transaminitis - likely shock liver  P:   PPI OG tube Track LFT   A:  ? Xaretlo at home ? indication Anemia admission that is gotten worse.  No clear-cut evidence of bleed   P:  - PRBC for hgb </= 6.9gm%    - exceptions are   -  if ACS susepcted/confirmed then transfuse for hgb </= 8.0gm%,  or    -  active bleeding with hemodynamic instability, then transfuse regardless of hemoglobin value   At at all times try to transfuse 1 unit prbc as possible with exception of active hemorrhage    A Thrombocytosis at admission 12/11/2020 but is asked to risk for thrombocytopenia  P Hold off anticoagulation given multiple polytrauma fractures   A:   At risk for hypo and hyperglycemia ? Chronic steroids at home  P:   SSI Sress dose steroid if hypotension recurs  A Hx of rheumatoid arthritis - ? Chronic pred Hx of pseudogout Sternal fracture, L3 fracture, T8 fracture and bilateral rib fracture -?  Due to CPR INtraosseous line placed 12/13/2020  Plan - Hard collar -Monitor - IO to be removed by 01/29/2021   Best practice (evaluated daily)  Diet: npo but will need TF Pain/Anxiety/Delirium protocol (if indicated): per protocol VAP  protocol (if indicated): yes DVT prophylaxis: scd -> anticog once bleed risk subside GI prophylaxis: ppi Glucose control: ssi Mobility: bed rest Disposition:from ER to ICU  Goals of Care:   Multi-Disciplinary Goals of Care Discussion -pending   Family Updates:  -    Goals of care over phone Gary [name] [phone] updaetd 11:54 PM  - says patient would not want to be on life support. Son and his brother discussed -> partial code now but regardngn terminal wean v continued active care my advise was to see what neuro course looks like which is few to several days. Prognosis in very poor     ATTESTATION & SIGNATURE   The patient ASHVIK GRUNDMAN is critically ill with multiple organ systems failure and requires high complexity decision making for assessment and support, frequent evaluation and titration of therapies, application of advanced monitoring technologies and extensive interpretation of multiple databases.   Critical Care Time devoted to patient care services described in this note is  60  Minutes. This time reflects time of care of this signee Dr Brand Males. This critical care time does not reflect procedure time, or teaching time or supervisory time of PA/NP/Med student/Med Resident etc but could involve care discussion time  Dr. Brand Males, M.D., [facility].C.P Pulmonary and Critical Care Medicine Staff Physician [facility] Pager: [phone], If no answer or between  15:00h - 7:00h: call [phone]  12/27/2020 11:12 PM    LABS    PULMONARY Recent Labs  Lab 12/16/2020 2149  PHART 7.183*  PCO2ART 33.1  PO2ART 411*  HCO3 12.6*  TCO2 14*  O2SAT 100.0    CBC Recent Labs  Lab 12/15/2020 2104 01/02/2021 2149  HGB 12.9* 9.5*  HCT 42.7 28.0*  WBC 16.5*  --   PLT 424*  --     COAGULATION Recent Labs  Lab 12/28/2020 2104  INR 1.6*    CARDIAC  No results for input(s): TROPONINI in the last 168  hours. No results for input(s): PROBNP in the last 168 hours.   CHEMISTRY Recent Labs  Lab 12/18/2020 2104 12/26/2020 2149  NA 138 136  K 5.2* 3.8  CL 100  --   CO2 11*  --   GLUCOSE 253*  --   BUN 18  --   CREATININE 2.42*  --   CALCIUM 9.1  --    Estimated Creatinine Clearance: 25.6 mL/min (A) (by C-G formula based on SCr of 2.42 mg/dL (H)).   LIVER Recent Labs  Lab 12/18/2020 2104  AST 1,022*  ALT 374*  ALKPHOS 94  BILITOT 2.2*  PROT 6.5  ALBUMIN 2.4*  INR 1.6*     INFECTIOUS Recent Labs  Lab 01/04/2021 2104  LATICACIDVEN >11.0*     ENDOCRINE CBG (last 3)  Recent Labs    12/16/2020 2057 12/28/2020 2205  GLUCAP 64* 177*         IMAGING x48h  - image(s) personally visualized  -   highlighted in bold CT Head Wo Contrast  Result Date: 01/04/2021 CLINICAL DATA:  Change in mental status cardiac arrest EXAM: CT HEAD WITHOUT CONTRAST TECHNIQUE: Contiguous axial images were obtained from the base of the skull through the vertex without intravenous contrast. COMPARISON:  MRI October 04, 2017 FINDINGS: Brain: No evidence of acute territorial infarction, hemorrhage, hydrocephalus,extra-axial collection or mass lesion/mass effect. There is dilatation the ventricles and sulci consistent with age-related atrophy. Low-attenuation changes in the deep white matter consistent with small vessel ischemia. Vascular: No hyperdense vessel or unexpected calcification. Skull: The skull is intact. No fracture or focal lesion identified. Sinuses/Orbits: There is near complete opacification of the right maxillary sinus and sphenoid sinus with remodeling the maxillary sinus with cortical breakthrough into the ethmoid air cells hyperdense rounded area of masslike secretions/debris as on prior MRI. The orbits and globes intact. Other: None Cervical spine: Alignment: Physiologic Skull base and vertebrae: Visualized skull base is intact. No atlanto-occipital dissociation. The vertebral body heights  are well maintained. No fracture or pathologic osseous lesion seen. Soft tissues and spinal canal: The visualized paraspinal soft tissues are unremarkable. No prevertebral soft tissue swelling is seen. The spinal canal is grossly unremarkable, no large epidural collection or significant canal narrowing. Partially visualized ET tube is seen. There is also debris seen within the upper oropharynx. Disc levels: Multilevel cervical spine spondylosis seen with disc osteophyte complex and uncovertebral osteophytes most notable at C3-C4 and C4-C5 with mild narrowing of the central canal and right neural foraminal narrowing. Upper chest: A right apical pneumothorax is present. Subpleural bleb formation seen within the bilateral lung apices. Other: None IMPRESSION: 1. No acute intracranial abnormality. 2. Findings consistent with age related atrophy and chronic small vessel  ischemia 3.  No acute fracture or malalignment of the spine. 4. Chronic sinusitis with debris/masslike area within the right maxillary sinus and sphenoid sinus 5. Right apical pneumothorax, better evaluated on CT chest same day Electronically Signed   By: Prudencio Pair M.D.   On: 12/29/2020 22:08   CT Cervical Spine Wo Contrast  Result Date: 12/26/2020 CLINICAL DATA:  Change in mental status cardiac arrest EXAM: CT HEAD WITHOUT CONTRAST TECHNIQUE: Contiguous axial images were obtained from the base of the skull through the vertex without intravenous contrast. COMPARISON:  MRI October 04, 2017 FINDINGS: Brain: No evidence of acute territorial infarction, hemorrhage, hydrocephalus,extra-axial collection or mass lesion/mass effect. There is dilatation the ventricles and sulci consistent with age-related atrophy. Low-attenuation changes in the deep white matter consistent with small vessel ischemia. Vascular: No hyperdense vessel or unexpected calcification. Skull: The skull is intact. No fracture or focal lesion identified. Sinuses/Orbits: There is near  complete opacification of the right maxillary sinus and sphenoid sinus with remodeling the maxillary sinus with cortical breakthrough into the ethmoid air cells hyperdense rounded area of masslike secretions/debris as on prior MRI. The orbits and globes intact. Other: None Cervical spine: Alignment: Physiologic Skull base and vertebrae: Visualized skull base is intact. No atlanto-occipital dissociation. The vertebral body heights are well maintained. No fracture or pathologic osseous lesion seen. Soft tissues and spinal canal: The visualized paraspinal soft tissues are unremarkable. No prevertebral soft tissue swelling is seen. The spinal canal is grossly unremarkable, no large epidural collection or significant canal narrowing. Partially visualized ET tube is seen. There is also debris seen within the upper oropharynx. Disc levels: Multilevel cervical spine spondylosis seen with disc osteophyte complex and uncovertebral osteophytes most notable at C3-C4 and C4-C5 with mild narrowing of the central canal and right neural foraminal narrowing. Upper chest: A right apical pneumothorax is present. Subpleural bleb formation seen within the bilateral lung apices. Other: None IMPRESSION: 1. No acute intracranial abnormality. 2. Findings consistent with age related atrophy and chronic small vessel ischemia 3.  No acute fracture or malalignment of the spine. 4. Chronic sinusitis with debris/masslike area within the right maxillary sinus and sphenoid sinus 5. Right apical pneumothorax, better evaluated on CT chest same day Electronically Signed   By: Prudencio Pair M.D.   On: 12/28/2020 22:08   DG Pelvis Portable  Result Date: 12/26/2020 CLINICAL DATA:  Post arrest cardiac. Found slumped over and rocking chair. EXAM: PORTABLE PELVIS 1-2 VIEWS COMPARISON:  X-ray hip 12/22/2012 FINDINGS: Similar appearance of 3 surgical screws status post fixation of a left femoral head fracture. There is no evidence of pelvic fracture or  diastasis. No pelvic bone lesions are seen. Degenerative changes of the visualized lower lumbar spine. Limited evaluation of the sacrum due to overlying soft tissues and bowel. Similar-appearing slight sclerosis of the right sacroiliac joint. Foley temperature probe overlying the pelvis. IMPRESSION: Negative. Electronically Signed   By: Iven Finn M.D.   On: 12/15/2020 21:30   CT CHEST ABDOMEN PELVIS W CONTRAST  Result Date: 12/09/2020 CLINICAL DATA:  cardiac arrest. Pt lives alone, neighbor found slumped over a rocking chair EXAM: CT CHEST, ABDOMEN, AND PELVIS WITH CONTRAST TECHNIQUE: Multidetector CT imaging of the chest, abdomen and pelvis was performed following the standard protocol during bolus administration of intravenous contrast. CONTRAST:  151mL OMNIPAQUE IOHEXOL 300 MG/ML  SOLN COMPARISON:  CT angio chest, abdomen, pelvis 03/19/2019. FINDINGS: Ports and Devices: Endotracheal tube terminates 3.5 cm above the carina. Foley catheter terminates within the  urinary bladder lumen with several associated intraluminal foci of gas likely due to instrumentation. CHEST: Lungs/airways: Bilateral centrilobular and and paraseptal emphysematous changes. Bilateral lower lobe dependent lower lobe consolidations/atelectasis. There is a poorly defined 2.3 cm ground-glass left upper lobe pulmonary nodule (5:65-69). A 1.6 cm left upper lobe subpleural ground-glass pulmonary nodule (5:44). Suggestion of an associated less conspicuous logical within the left upper lobe more superiorly (5:37). There is a stable 0.8 x 0.4 cm pulmonary nodule within the right upper lobe (5:57). Other previously identified right upper lobe nodules are poorly visualized likely due to collapse the lung. Right lung calcified granuloma. No pulmonary laceration.  No pneumatocele formation. The central airways are patent. Pleura: No pleural effusion. Moderate to large volume right pneumothorax. No left pneumothorax. No hemothorax. Lymph Nodes:  Calcified right hilar lymph nodes. No mediastinal, hilar, or axillary lymphadenopathy. Mediastinum: No pneumomediastinum. No aortic injury or mediastinal hematoma. The thoracic aorta is normal in caliber. Atherosclerotic plaque. Coronary artery calcifications. The heart is normal in size. No significant pericardial effusion. The esophagus is unremarkable. The thyroid is not visualized. Chest Wall / Breasts: No chest wall mass. Musculoskeletal: Nondisplaced acute fracture of the sternum in a couple different locations anteriorly (7:82). Acute minimally displaced (fifth rib is full shaft with displaced) right rib fractures involving the anterolateral third through eighth ribs. Acute minimally displaced (sixth rib is displaced) left rib fractures involving the anterolateral third through ninth ribs. Please see separately dictated CT thoracic spine 12/28/2020. ABDOMEN / PELVIS: Liver: Not enlarged. No focal lesion. No laceration or subcapsular hematoma. Biliary System: The gallbladder is otherwise unremarkable with no radio-opaque gallstones. No biliary ductal dilatation. Pancreas: Normal pancreatic contour. No main pancreatic duct dilatation. Spleen: Not enlarged. No focal lesion. No laceration, subcapsular hematoma, or vascular injury. Adrenal Glands: No nodularity bilaterally. Kidneys: Bilateral kidneys enhance symmetrically. No hydronephrosis. No contusion, laceration, or subcapsular hematoma. No injury to the vascular structures or collecting systems. No hydroureter. The urinary bladder is unremarkable. Bowel: Fluid within the lumen of the large bowel is noted. No pneumatosis. No small or large bowel wall thickening or dilatation. The appendix is unremarkable. Mesentery, Omentum, and Peritoneum: No simple free fluid ascites. No pneumoperitoneum. No hemoperitoneum. No mesenteric hematoma identified. No organized fluid collection. Pelvic Organs: Normal. Lymph Nodes: No abdominal, pelvic, inguinal lymphadenopathy.  Vasculature: Atherosclerotic plaque. No abdominal aorta or iliac aneurysm. No active contrast extravasation or pseudoaneurysm. Musculoskeletal: No significant soft tissue hematoma. No acute pelvic fracture. Fixation of the left femoral neck with 3 screws. Please see separately dictated CT lumbar spine 01/02/2021. IMPRESSION: 1. Moderate to large volume right pneumothorax. Per chest x-ray 12/17/2020, findings reported to team at 9:30 p.m. 2. Likely bilateral lower lobe aspiration pneumonia. 3. Interval development of a 1.5 cm subpleural and a 2.3 cm ground-glass left upper lobe pulmonary nodules. Adenocarcinoma not excluded. Additional imaging evaluation or consultation with Pulmonology or Thoracic Surgery recommended. 4. Associated acute minimally displaced (fifth rib is full shaft with displaced) right rib fractures involving the anterolateral third through eighth ribs. 5. Acute minimally displaced (sixth rib is displaced) left rib fractures involving the anterolateral third through ninth ribs. No definite left pneumothorax. 6. Acute sternal fracture. 7. Fast transition state of the large bowel. 8. Please see separately dictated CT thoracic and lumbar spine 12/19/2020. Electronically Signed   By: Iven Finn M.D.   On: 12/28/2020 22:23   CT T-SPINE NO CHARGE  Result Date: 12/28/2020 CLINICAL DATA:  Possible fall with back trauma. EXAM: CT THORACIC AND  LUMBAR SPINE WITHOUT CONTRAST TECHNIQUE: Multidetector CT imaging of the thoracic and lumbar spine was performed without contrast. Multiplanar CT image reconstructions were also generated. COMPARISON:  CT chest 07/03/2019 FINDINGS: CT THORACIC SPINE FINDINGS Alignment: Mild scoliotic curvature convex to the right. No antero or retrolisthesis. Vertebrae: Acute inferior endplate fracture at T8 with loss of height of about 20%. No retropulsed bone. No other acute fracture in the region. Mild chronic loss of height at the inferior endplate regions of T6 and T7.  Paraspinal and other soft tissues: See results of chest CT. Disc levels: No significant disc space pathology. No stenosis of the canal or foramina. Ordinary mild facet osteoarthritis. CT LUMBAR SPINE FINDINGS Segmentation: S1 is transitional, largely lumbarized. Alignment: Normal Vertebrae: Acute superior endplate fracture L3 with loss of height of 20%. Mild posterior bowing of the posterosuperior margin of the vertebral body. No compressive encroachment upon the canal. No other lumbosacral injury identified, through S3. Paraspinal and other soft tissues: Aortic atherosclerosis. No other retroperitoneal finding. Disc levels: L1-2: Minimal noncompressive disc bulge. L2-3: Mild bulging of the disc. Mild posterior bowing of the posterosuperior margin of L3 related to the fracture. No apparent compressive stenosis. L3-4: Mild bulging of the disc. Mild bilateral lateral recess narrowing. L4-5: Mild bulging of the disc. Mild bilateral lateral recess narrowing. L5-S1: Bulging of the disc. Facet hypertrophy. Mild to moderate stenosis of both lateral recesses. S1-2: Transitional level. Facet hypertrophy. Mild narrowing of the subarticular lateral recess on the right because of osteophytic encroachment. IMPRESSION: 1. CT THORACIC SPINE IMPRESSION Acute inferior endplate fracture at T8 with loss of height of about 20%. No retropulsed bone. 2. CT LUMBAR SPINE IMPRESSION Acute superior endplate fracture at L3 with loss of height of 20%. Mild posterior bowing of the posterosuperior margin of L3 related to the fracture. No compressive encroachment upon the canal. No other lumbosacral injury identified, through S3. 3. Other lumbar degenerative changes as outlined above. Aortic Atherosclerosis (ICD10-I70.0). Electronically Signed   By: Nelson Chimes M.D.   On: 12/13/2020 22:05   CT L-SPINE NO CHARGE  Result Date: 12/29/2020 CLINICAL DATA:  Possible fall with back trauma. EXAM: CT THORACIC AND LUMBAR SPINE WITHOUT CONTRAST  TECHNIQUE: Multidetector CT imaging of the thoracic and lumbar spine was performed without contrast. Multiplanar CT image reconstructions were also generated. COMPARISON:  CT chest 07/03/2019 FINDINGS: CT THORACIC SPINE FINDINGS Alignment: Mild scoliotic curvature convex to the right. No antero or retrolisthesis. Vertebrae: Acute inferior endplate fracture at T8 with loss of height of about 20%. No retropulsed bone. No other acute fracture in the region. Mild chronic loss of height at the inferior endplate regions of T6 and T7. Paraspinal and other soft tissues: See results of chest CT. Disc levels: No significant disc space pathology. No stenosis of the canal or foramina. Ordinary mild facet osteoarthritis. CT LUMBAR SPINE FINDINGS Segmentation: S1 is transitional, largely lumbarized. Alignment: Normal Vertebrae: Acute superior endplate fracture L3 with loss of height of 20%. Mild posterior bowing of the posterosuperior margin of the vertebral body. No compressive encroachment upon the canal. No other lumbosacral injury identified, through S3. Paraspinal and other soft tissues: Aortic atherosclerosis. No other retroperitoneal finding. Disc levels: L1-2: Minimal noncompressive disc bulge. L2-3: Mild bulging of the disc. Mild posterior bowing of the posterosuperior margin of L3 related to the fracture. No apparent compressive stenosis. L3-4: Mild bulging of the disc. Mild bilateral lateral recess narrowing. L4-5: Mild bulging of the disc. Mild bilateral lateral recess narrowing. L5-S1: Bulging of the  disc. Facet hypertrophy. Mild to moderate stenosis of both lateral recesses. S1-2: Transitional level. Facet hypertrophy. Mild narrowing of the subarticular lateral recess on the right because of osteophytic encroachment. IMPRESSION: 1. CT THORACIC SPINE IMPRESSION Acute inferior endplate fracture at T8 with loss of height of about 20%. No retropulsed bone. 2. CT LUMBAR SPINE IMPRESSION Acute superior endplate fracture  at L3 with loss of height of 20%. Mild posterior bowing of the posterosuperior margin of L3 related to the fracture. No compressive encroachment upon the canal. No other lumbosacral injury identified, through S3. 3. Other lumbar degenerative changes as outlined above. Aortic Atherosclerosis (ICD10-I70.0). Electronically Signed   By: Nelson Chimes M.D.   On: 12/14/2020 22:05   DG Chest Port 1 View  Result Date: 12/31/2020 CLINICAL DATA:  Status post cardiac arrest. EXAM: PORTABLE CHEST 1 VIEW COMPARISON:  Mar 26, 2013. FINDINGS: The heart size and mediastinal contours are within normal limits. Endotracheal tube is in good position. Left lung is clear. Moderate size right apical and basilar pneumothorax is noted. No pleural effusion is noted. The visualized skeletal structures are unremarkable. IMPRESSION: Moderate size right apical and basilar pneumothorax. Critical Value/emergent results were called by telephone at the time of interpretation on 12/13/2020 at 9:30 pm to provider Eliott Nine, RN, who verbally acknowledged these results. Electronically Signed   By: Marijo Conception M.D.   On: 12/21/2020 21:30

## 2021-01-01 NOTE — ED Provider Notes (Signed)
[name] Provider Note   CSN: [id number] Arrival date & time: 12/08/2020  2053     History Chief Complaint  Patient presents with  . Cardiac Arrest    Gary [name] is a 80 y.o. male.  HPI   80 year old male presents the emergency department post cardiac arrest.  Report from EMS is that the patient was last seen yesterday.  No one was able to get a hold of the patient today so a neighbor was sent over who found the patient slumped over the side of a chair.  When EMS arrived the patient was pulseless, PEA.  He was intubated.  Got 3 rounds of CPR with epinephrine and ROSC was achieved.  He was hypotensive and arrived on epinephrine drip.  He arrives intubated, not sedated, GCS 3 T, tachycardic and hypertensive.  Past Medical History:  Diagnosis Date  . Anemia    "chronic anemic"  . Anginal pain ([place])   . Anxiety   . 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 Cancer ([place])    pre-cancerous lesions removed 11/22/12 and hx of  . Cancer ([place]) 10/01/12   R retromolar trigone, squamous cell  . Chronic back pain    hx of  . Chronic kidney disease    renal insufficiency, [place] [facility]  . Coronary artery disease    stent placement 2000, does not see cardiologist at this time  . Dyslipidemia   . Full dentures    Edentulous since age [age]  . GERD (gastroesophageal reflux disease)   . Gout    pseudo gout  . Hearing impairment    job related, hbilat hearing aids  . Hepatitis    hepatitis "C"  . History of radiation therapy 03/11/2013   thru -04/23/2013;  right retromolar trigone/54Gy/10fx  . Hypertension    sees Dr. Deland Pretty  . Hypertension   . Hypothyroid   . Insomnia    cpap  . Iron deficiency anemia 05/24/2019  . Rheumatoid arthritis ([place])    "all over" (09/13/2016)  . Sleep apnea    "tried mask 3 different times; couldn't tolerate it" (09/13/2016)  . Thyroid disease    Hypothyroidism    Patient  Active Problem List   Diagnosis Date Noted  . Iron deficiency anemia 05/24/2019  . Acute cholecystitis 03/19/2019  . Post PTCA 09/13/2016  . Exposed mandible 05/20/2014  . . 03/26/2013  . COPD with acute exacerbation ([place]) 03/26/2013  . Pulmonary nodule 01/06/2013  . Diastolic dysfunction, with good LVF 2D Feb 2014 12/25/2012  . Chronic anticoagulation, new Feb 2014- Xarelto 12/25/2012  . PAF, new Feb 2014 with SSS component 12/24/2012  . 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 Hip fracture, left, S/P surgical repair 12/22/12 12/21/2012  . Coronary artery disease   . HTN (hypertension)   . Anxiety   . Sleep apnea   . Chronic kidney disease. stage 2   . GERD (gastroesophageal reflux disease)   . RA (rheumatoid arthritis) ([place])   . Gout   . Anemia   . Hepatitis   . Chronic back pain   . Dyslipidemia   . Hypothyroid   . CAD, LAD stent 2002     Past Surgical History:  Procedure Laterality Date  . CARDIAC CATHETERIZATION N/A 09/13/2016   Procedure: Left Heart Cath and Coronary Angiography;  Surgeon: Adrian Prows, MD;  Location: [place] [facility];  Service: Cardiovascular;  Laterality: N/A;  .  CARDIAC CATHETERIZATION N/A 10/11/2016   Procedure: Coronary Stent Intervention;  Surgeon: Adrian Prows, MD;  Location: [place] [facility];  Service: Cardiovascular;  Laterality: N/A;  LAD & 1st Diag  . CATARACT EXTRACTION W/ INTRAOCULAR LENS  IMPLANT, BILATERAL Bilateral 2007  . CHOLECYSTECTOMY N/A 03/20/2019   Procedure: LAPAROSCOPIC CHOLECYSTECTOMY;  Surgeon: Clovis Riley, MD;  Location: [facility];  Service: General;  Laterality: N/A;  . CORONARY ANGIOPLASTY  09/13/2016  . CORONARY ANGIOPLASTY WITH STENT PLACEMENT  2002  . FRACTURE SURGERY    . HIP PINNING,CANNULATED Left 12/22/2012   Procedure: CANNULATED HIP PINNING;  Surgeon: Mcarthur Rossetti, MD;  Location: [place];  Service: Orthopedics;  Laterality: Left;  [name] MULTIPLE TOOTH EXTRACTIONS  ~ 1962   Edentulous  . RADICAL  NECK DISSECTION Right 01/22/2013   [facility]  . SKIN CANCER EXCISION     "face, ears"  . SKIN GRAFT Left ~ 1961   "grafted skin off my stomach & put it on left hand that I had caught in fan belt"  . TONSILLECTOMY         Family History  Problem Relation Age of Onset  . Coronary artery disease Brother 103  . Heart attack Brother   . Coronary artery disease Father        stents in 70's  . Cancer Mother        bone    Social History   Tobacco Use  . Smoking status: Former Smoker    Packs/day: 1.00    Years: 27.00    Pack years: 27.00    Types: Cigarettes    Quit date: 11/08/1983    Years since quitting: 37.1  . Smokeless tobacco: Never Used  Substance Use Topics  . Alcohol use: Yes    Comment: 09/13/2016 "nothing since 12/2012"  . Drug use: No    Home Medications Prior to Admission medications   Medication Sig Start Date End Date Taking? Authorizing Provider  acyclovir (ZOVIRAX) 400 MG tablet Take 400 mg by mouth 2 (two) times daily.  03/01/13   [provider]  atorvastatin (LIPITOR) 40 MG tablet Take 40 mg by mouth 2 (two) times daily.  09/09/16   [provider]  Calcium Carbonate-Vitamin D (CALCIUM-D) 600-400 MG-UNIT TABS Take 1 tablet by mouth daily.     [provider]  Cholecalciferol (VITAMIN D3) 50 MCG (2000 UT) capsule Take 2,000 Units by mouth daily.    [provider]  clobetasol (TEMOVATE) 0.05 % external solution Apply 1 application topically as needed. 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 For anxiety Patient taking differently: Take 5 mg by mouth at bedtime as needed (sleep). For anxiety 12/25/12   Barton Dubois, MD  EPINEPHrine 0.3 mg/0.3 mL IJ SOAJ injection Inject 0.3 mg into the muscle as directed.  05/01/14   [provider]  ferrous sulfate 220 (44 Fe) MG/5ML solution  06/10/19    [provider]  Glucosamine 500 MG CAPS Take 1 capsule by mouth daily.    [provider]  lactose free nutrition (BOOST) LIQD Take 237 mLs by mouth 3 (three) times daily between meals. As needed    [provider]  levothyroxine (SYNTHROID, LEVOTHROID) 112 MCG tablet Take 112 mcg daily before breakfast by mouth.    [provider]  lisinopril (ZESTRIL) 5 MG tablet Take 5  mg by mouth daily. 02/12/19   [provider]  magnesium oxide (MAG-OX) 400 MG tablet Take 400 mg by mouth at bedtime.    [provider]  meclizine (ANTIVERT) 25 MG tablet Take 25 mg by mouth every 12 (twelve) hours as needed for dizziness.     [provider]  mirtazapine (REMERON) 15 MG tablet Take 7.5 mg by mouth at bedtime.     [provider]  Multiple Vitamin (MULTIVITAMIN WITH MINERALS) TABS tablet Take 1 tablet by mouth daily.    [provider]  nitroGLYCERIN (NITROSTAT) 0.4 MG SL tablet TAKE 1 TABLET SUBLINGUALLY EVERY 5 MINUTES AS NEEDED FOR CHEST PAIN UP TO 3 DOSES THEN CALL 911 07/04/19   [provider]  Omega-3 1000 MG CAPS Take 2 capsules by mouth daily.    [provider]  pantoprazole (PROTONIX) 40 MG tablet Take 40 mg by mouth 2 (two) times daily. 01/14/19   [provider]  Polyethyl Glycol-Propyl Glycol (SYSTANE) 0.4-0.3 % GEL ophthalmic gel Place 1 application into both eyes as needed (dry eyes).    [provider]  polyethylene glycol (MIRALAX / GLYCOLAX) 17 g packet Take 17 g by mouth daily.    [provider]  predniSONE (DELTASONE) 5 MG tablet Take 5 mg by mouth at bedtime.     [provider]  predniSONE (DELTASONE) 5 MG tablet Take 1 tablet by mouth daily. 04/19/19   [provider]  propranolol (INDERAL) 10 MG tablet Take 10 mg by mouth 2 (two) times daily.    [provider]  sertraline (ZOLOFT) 100 MG tablet Take 100 mg by mouth at bedtime.  11/20/12    [provider]  XARELTO 10 MG TABS tablet Take 10 mg by mouth daily. 01/03/19   [provider]    Allergies    Bee venom, Cellcept [mycophenolate mofetil], Colcrys [colchicine], Erythromycin, Methotrexate derivatives, Neosporin [neomycin-bacitracin zn-polymyx], Trazodone, and Ultram [tramadol]  Review of Systems   Review of Systems  Unable to perform ROS: Intubated    Physical Exam Updated Vital Signs BP 98/62   Pulse 94   Temp (!) 95 F (35 C)   Resp 19   Ht 5\' 10"  (1.778 m)   Wt 74.8 kg   SpO2 99%   BMI 23.66 kg/m   Physical Exam Vitals and nursing note reviewed.  Constitutional:      Comments: Unresponsive and intubated  HENT:     Head: Normocephalic.     Right Ear: External ear normal.     Left Ear: External ear normal.     Nose: Nose normal.     Mouth/Throat:     Mouth: Mucous membranes are dry.  Eyes:     Comments: Pupils approximately 3 mm, minimally responsive  Cardiovascular:     Rate and Rhythm: Tachycardia present.  Pulmonary:     Comments: Intubated Abdominal:     General: There is no distension.     Palpations: Abdomen is soft.     Comments: Linear ecchymotic marks running across the abdomen  Musculoskeletal:        General: No deformity.     Comments: Superficial abrasions to the extremities  Skin:    Comments: Cool to the touch  Neurological:     Comments: GCS 3 T     ED Results / Procedures / Treatments   Labs (all labs ordered are listed, but only abnormal results are displayed) Labs Reviewed  COMPREHENSIVE METABOLIC PANEL - Abnormal;  Notable for the following components:      Result Value   Potassium 5.2 (*)    CO2 11 (*)    Glucose, Bld 253 (*)    Creatinine, Ser 2.42 (*)    Albumin 2.4 (*)    AST 1,022 (*)    ALT 374 (*)    Total Bilirubin 2.2 (*)    GFR, Estimated 27 (*)    Anion gap 27 (*)    All other components within normal limits  CBC WITH DIFFERENTIAL/PLATELET - Abnormal; Notable for the following  components:   WBC 16.5 (*)    RBC 3.89 (*)    Hemoglobin 12.9 (*)    MCV 109.8 (*)    Platelets 424 (*)    Neutro Abs 8.3 (*)    Lymphs Abs 7.1 (*)    Eosinophils Absolute 0.8 (*)    All other components within normal limits  URINALYSIS, ROUTINE W REFLEX MICROSCOPIC - Abnormal; Notable for the following components:   APPearance HAZY (*)    Glucose, UA 150 (*)    Hgb urine dipstick LARGE (*)    Ketones, ur 5 (*)    Protein, ur 100 (*)    RBC / HPF >50 (*)    Bacteria, UA RARE (*)    All other components within normal limits  LACTIC ACID, PLASMA - Abnormal; Notable for the following components:   Lactic Acid, Venous >11.0 (*)    All other components within normal limits  PROTIME-INR - Abnormal; Notable for the following components:   Prothrombin Time 18.6 (*)    INR 1.6 (*)    All other components within normal limits  CBG MONITORING, ED - Abnormal; Notable for the following components:   Glucose-Capillary 64 (*)    All other components within normal limits  I-STAT ARTERIAL BLOOD GAS, ED - Abnormal; Notable for the following components:   pH, Arterial 7.183 (*)    pO2, Arterial 411 (*)    Bicarbonate 12.6 (*)    TCO2 14 (*)    Acid-base deficit 15.0 (*)    Calcium, Ion 1.12 (*)    HCT 28.0 (*)    Hemoglobin 9.5 (*)    All other components within normal limits  CBG MONITORING, ED - Abnormal; Notable for the following components:   Glucose-Capillary 177 (*)    All other components within normal limits  TROPONIN I (HIGH SENSITIVITY) - Abnormal; Notable for the following components:   Troponin I (High Sensitivity) 78 (*)    All other components within normal limits  RESP PANEL BY RT-PCR (FLU A&B, COVID) ARPGX2  CULTURE, BLOOD (ROUTINE X 2)  CULTURE, BLOOD (ROUTINE X 2)  BLOOD GAS, ARTERIAL  LACTIC ACID, PLASMA  TROPONIN I (HIGH SENSITIVITY)    EKG EKG Interpretation  Date/Time:  Friday January 01 2021 21:01:02 EST Ventricular Rate:  112 PR Interval:    QRS  Duration: 122 QT Interval:  446 QTC Calculation: 609 R Axis:   96 Text Interpretation: Sinus tachycardia Multiple premature complexes, vent & supraven Aberrant complex Nonspecific intraventricular conduction delay Nonspecific repol abnormality, diffuse leads sinus tachycardia,  possible RBBB, changed from previous Confirmed by Lavenia Atlas [phone]) on 12/21/2020 9:03:17 PM   Radiology CT Head Wo Contrast  Result Date: 12/31/2020 CLINICAL DATA:  Change in mental status cardiac arrest EXAM: CT HEAD WITHOUT CONTRAST TECHNIQUE: Contiguous axial images were obtained from the base of the skull through the vertex without intravenous contrast. COMPARISON:  MRI October 04, 2017 FINDINGS: Brain: No  evidence of acute territorial infarction, hemorrhage, hydrocephalus,extra-axial collection or mass lesion/mass effect. There is dilatation the ventricles and sulci consistent with age-related atrophy. Low-attenuation changes in the deep white matter consistent with small vessel ischemia. Vascular: No hyperdense vessel or unexpected calcification. Skull: The skull is intact. No fracture or focal lesion identified. Sinuses/Orbits: There is near complete opacification of the right maxillary sinus and sphenoid sinus with remodeling the maxillary sinus with cortical breakthrough into the ethmoid air cells hyperdense rounded area of masslike secretions/debris as on prior MRI. The orbits and globes intact. Other: None Cervical spine: Alignment: Physiologic Skull base and vertebrae: Visualized skull base is intact. No atlanto-occipital dissociation. The vertebral body heights are well maintained. No fracture or pathologic osseous lesion seen. Soft tissues and spinal canal: The visualized paraspinal soft tissues are unremarkable. No prevertebral soft tissue swelling is seen. The spinal canal is grossly unremarkable, no large epidural collection or significant canal narrowing. Partially visualized ET tube is seen. There is also  debris seen within the upper oropharynx. Disc levels: Multilevel cervical spine spondylosis seen with disc osteophyte complex and uncovertebral osteophytes most notable at C3-C4 and C4-C5 with mild narrowing of the central canal and right neural foraminal narrowing. Upper chest: A right apical pneumothorax is present. Subpleural bleb formation seen within the bilateral lung apices. Other: None IMPRESSION: 1. No acute intracranial abnormality. 2. Findings consistent with age related atrophy and chronic small vessel ischemia 3.  No acute fracture or malalignment of the spine. 4. Chronic sinusitis with debris/masslike area within the right maxillary sinus and sphenoid sinus 5. Right apical pneumothorax, better evaluated on CT chest same day Electronically Signed   By: Prudencio Pair M.D.   On: 12/11/2020 22:08   CT Cervical Spine Wo Contrast  Result Date: 12/24/2020 CLINICAL DATA:  Change in mental status cardiac arrest EXAM: CT HEAD WITHOUT CONTRAST TECHNIQUE: Contiguous axial images were obtained from the base of the skull through the vertex without intravenous contrast. COMPARISON:  MRI October 04, 2017 FINDINGS: Brain: No evidence of acute territorial infarction, hemorrhage, hydrocephalus,extra-axial collection or mass lesion/mass effect. There is dilatation the ventricles and sulci consistent with age-related atrophy. Low-attenuation changes in the deep white matter consistent with small vessel ischemia. Vascular: No hyperdense vessel or unexpected calcification. Skull: The skull is intact. No fracture or focal lesion identified. Sinuses/Orbits: There is near complete opacification of the right maxillary sinus and sphenoid sinus with remodeling the maxillary sinus with cortical breakthrough into the ethmoid air cells hyperdense rounded area of masslike secretions/debris as on prior MRI. The orbits and globes intact. Other: None Cervical spine: Alignment: Physiologic Skull base and vertebrae: Visualized skull  base is intact. No atlanto-occipital dissociation. The vertebral body heights are well maintained. No fracture or pathologic osseous lesion seen. Soft tissues and spinal canal: The visualized paraspinal soft tissues are unremarkable. No prevertebral soft tissue swelling is seen. The spinal canal is grossly unremarkable, no large epidural collection or significant canal narrowing. Partially visualized ET tube is seen. There is also debris seen within the upper oropharynx. Disc levels: Multilevel cervical spine spondylosis seen with disc osteophyte complex and uncovertebral osteophytes most notable at C3-C4 and C4-C5 with mild narrowing of the central canal and right neural foraminal narrowing. Upper chest: A right apical pneumothorax is present. Subpleural bleb formation seen within the bilateral lung apices. Other: None IMPRESSION: 1. No acute intracranial abnormality. 2. Findings consistent with age related atrophy and chronic small vessel ischemia 3.  No acute fracture or malalignment of the  spine. 4. Chronic sinusitis with debris/masslike area within the right maxillary sinus and sphenoid sinus 5. Right apical pneumothorax, better evaluated on CT chest same day Electronically Signed   By: Prudencio Pair M.D.   On: 12/25/2020 22:08   DG Pelvis Portable  Result Date: 12/30/2020 CLINICAL DATA:  Post arrest cardiac. Found slumped over and rocking chair. EXAM: PORTABLE PELVIS 1-2 VIEWS COMPARISON:  X-ray hip 12/22/2012 FINDINGS: Similar appearance of 3 surgical screws status post fixation of a left femoral head fracture. There is no evidence of pelvic fracture or diastasis. No pelvic bone lesions are seen. Degenerative changes of the visualized lower lumbar spine. Limited evaluation of the sacrum due to overlying soft tissues and bowel. Similar-appearing slight sclerosis of the right sacroiliac joint. Foley temperature probe overlying the pelvis. IMPRESSION: Negative. Electronically Signed   By: Iven Finn M.D.    On: 12/22/2020 21:30   CT CHEST ABDOMEN PELVIS W CONTRAST  Result Date: 12/11/2020 CLINICAL DATA:  cardiac arrest. Pt lives alone, neighbor found slumped over a rocking chair EXAM: CT CHEST, ABDOMEN, AND PELVIS WITH CONTRAST TECHNIQUE: Multidetector CT imaging of the chest, abdomen and pelvis was performed following the standard protocol during bolus administration of intravenous contrast. CONTRAST:  174mL OMNIPAQUE IOHEXOL 300 MG/ML  SOLN COMPARISON:  CT angio chest, abdomen, pelvis 03/19/2019. FINDINGS: Ports and Devices: Endotracheal tube terminates 3.5 cm above the carina. Foley catheter terminates within the urinary bladder lumen with several associated intraluminal foci of gas likely due to instrumentation. CHEST: Lungs/airways: Bilateral centrilobular and and paraseptal emphysematous changes. Bilateral lower lobe dependent lower lobe consolidations/atelectasis. There is a poorly defined 2.3 cm ground-glass left upper lobe pulmonary nodule (5:65-69). A 1.6 cm left upper lobe subpleural ground-glass pulmonary nodule (5:44). Suggestion of an associated less conspicuous logical within the left upper lobe more superiorly (5:37). There is a stable 0.8 x 0.4 cm pulmonary nodule within the right upper lobe (5:57). Other previously identified right upper lobe nodules are poorly visualized likely due to collapse the lung. Right lung calcified granuloma. No pulmonary laceration.  No pneumatocele formation. The central airways are patent. Pleura: No pleural effusion. Moderate to large volume right pneumothorax. No left pneumothorax. No hemothorax. Lymph Nodes: Calcified right hilar lymph nodes. No mediastinal, hilar, or axillary lymphadenopathy. Mediastinum: No pneumomediastinum. No aortic injury or mediastinal hematoma. The thoracic aorta is normal in caliber. Atherosclerotic plaque. Coronary artery calcifications. The heart is normal in size. No significant pericardial effusion. The esophagus is unremarkable. The  thyroid is not visualized. Chest Wall / Breasts: No chest wall mass. Musculoskeletal: Nondisplaced acute fracture of the sternum in a couple different locations anteriorly (7:82). Acute minimally displaced (fifth rib is full shaft with displaced) right rib fractures involving the anterolateral third through eighth ribs. Acute minimally displaced (sixth rib is displaced) left rib fractures involving the anterolateral third through ninth ribs. Please see separately dictated CT thoracic spine 12/21/2020. ABDOMEN / PELVIS: Liver: Not enlarged. No focal lesion. No laceration or subcapsular hematoma. Biliary System: The gallbladder is otherwise unremarkable with no radio-opaque gallstones. No biliary ductal dilatation. Pancreas: Normal pancreatic contour. No main pancreatic duct dilatation. Spleen: Not enlarged. No focal lesion. No laceration, subcapsular hematoma, or vascular injury. Adrenal Glands: No nodularity bilaterally. Kidneys: Bilateral kidneys enhance symmetrically. No hydronephrosis. No contusion, laceration, or subcapsular hematoma. No injury to the vascular structures or collecting systems. No hydroureter. The urinary bladder is unremarkable. Bowel: Fluid within the lumen of the large bowel is noted. No pneumatosis. No small or large  bowel wall thickening or dilatation. The appendix is unremarkable. Mesentery, Omentum, and Peritoneum: No simple free fluid ascites. No pneumoperitoneum. No hemoperitoneum. No mesenteric hematoma identified. No organized fluid collection. Pelvic Organs: Normal. Lymph Nodes: No abdominal, pelvic, inguinal lymphadenopathy. Vasculature: Atherosclerotic plaque. No abdominal aorta or iliac aneurysm. No active contrast extravasation or pseudoaneurysm. Musculoskeletal: No significant soft tissue hematoma. No acute pelvic fracture. Fixation of the left femoral neck with 3 screws. Please see separately dictated CT lumbar spine 12/19/2020. IMPRESSION: 1. Moderate to large volume right  pneumothorax. Per chest x-ray 12/09/2020, findings reported to team at 9:30 p.m. 2. Likely bilateral lower lobe aspiration pneumonia. 3. Interval development of a 1.5 cm subpleural and a 2.3 cm ground-glass left upper lobe pulmonary nodules. Adenocarcinoma not excluded. Additional imaging evaluation or consultation with Pulmonology or Thoracic Surgery recommended. 4. Associated acute minimally displaced (fifth rib is full shaft with displaced) right rib fractures involving the anterolateral third through eighth ribs. 5. Acute minimally displaced (sixth rib is displaced) left rib fractures involving the anterolateral third through ninth ribs. No definite left pneumothorax. 6. Acute sternal fracture. 7. Fast transition state of the large bowel. 8. Please see separately dictated CT thoracic and lumbar spine 12/18/2020. Electronically Signed   By: Iven Finn M.D.   On: 12/09/2020 22:23   CT T-SPINE NO CHARGE  Result Date: 12/08/2020 CLINICAL DATA:  Possible fall with back trauma. EXAM: CT THORACIC AND LUMBAR SPINE WITHOUT CONTRAST TECHNIQUE: Multidetector CT imaging of the thoracic and lumbar spine was performed without contrast. Multiplanar CT image reconstructions were also generated. COMPARISON:  CT chest 07/03/2019 FINDINGS: CT THORACIC SPINE FINDINGS Alignment: Mild scoliotic curvature convex to the right. No antero or retrolisthesis. Vertebrae: Acute inferior endplate fracture at T8 with loss of height of about 20%. No retropulsed bone. No other acute fracture in the region. Mild chronic loss of height at the inferior endplate regions of T6 and T7. Paraspinal and other soft tissues: See results of chest CT. Disc levels: No significant disc space pathology. No stenosis of the canal or foramina. Ordinary mild facet osteoarthritis. CT LUMBAR SPINE FINDINGS Segmentation: S1 is transitional, largely lumbarized. Alignment: Normal Vertebrae: Acute superior endplate fracture L3 with loss of height of 20%. Mild  posterior bowing of the posterosuperior margin of the vertebral body. No compressive encroachment upon the canal. No other lumbosacral injury identified, through S3. Paraspinal and other soft tissues: Aortic atherosclerosis. No other retroperitoneal finding. Disc levels: L1-2: Minimal noncompressive disc bulge. L2-3: Mild bulging of the disc. Mild posterior bowing of the posterosuperior margin of L3 related to the fracture. No apparent compressive stenosis. L3-4: Mild bulging of the disc. Mild bilateral lateral recess narrowing. L4-5: Mild bulging of the disc. Mild bilateral lateral recess narrowing. L5-S1: Bulging of the disc. Facet hypertrophy. Mild to moderate stenosis of both lateral recesses. S1-2: Transitional level. Facet hypertrophy. Mild narrowing of the subarticular lateral recess on the right because of osteophytic encroachment. IMPRESSION: 1. CT THORACIC SPINE IMPRESSION Acute inferior endplate fracture at T8 with loss of height of about 20%. No retropulsed bone. 2. CT LUMBAR SPINE IMPRESSION Acute superior endplate fracture at L3 with loss of height of 20%. Mild posterior bowing of the posterosuperior margin of L3 related to the fracture. No compressive encroachment upon the canal. No other lumbosacral injury identified, through S3. 3. Other lumbar degenerative changes as outlined above. Aortic Atherosclerosis (ICD10-I70.0). Electronically Signed   By: Nelson Chimes M.D.   On: 12/09/2020 22:05   CT L-SPINE NO CHARGE  Result Date: 12/22/2020 CLINICAL DATA:  Possible fall with back trauma. EXAM: CT THORACIC AND LUMBAR SPINE WITHOUT CONTRAST TECHNIQUE: Multidetector CT imaging of the thoracic and lumbar spine was performed without contrast. Multiplanar CT image reconstructions were also generated. COMPARISON:  CT chest 07/03/2019 FINDINGS: CT THORACIC SPINE FINDINGS Alignment: Mild scoliotic curvature convex to the right. No antero or retrolisthesis. Vertebrae: Acute inferior endplate fracture at T8  with loss of height of about 20%. No retropulsed bone. No other acute fracture in the region. Mild chronic loss of height at the inferior endplate regions of T6 and T7. Paraspinal and other soft tissues: See results of chest CT. Disc levels: No significant disc space pathology. No stenosis of the canal or foramina. Ordinary mild facet osteoarthritis. CT LUMBAR SPINE FINDINGS Segmentation: S1 is transitional, largely lumbarized. Alignment: Normal Vertebrae: Acute superior endplate fracture L3 with loss of height of 20%. Mild posterior bowing of the posterosuperior margin of the vertebral body. No compressive encroachment upon the canal. No other lumbosacral injury identified, through S3. Paraspinal and other soft tissues: Aortic atherosclerosis. No other retroperitoneal finding. Disc levels: L1-2: Minimal noncompressive disc bulge. L2-3: Mild bulging of the disc. Mild posterior bowing of the posterosuperior margin of L3 related to the fracture. No apparent compressive stenosis. L3-4: Mild bulging of the disc. Mild bilateral lateral recess narrowing. L4-5: Mild bulging of the disc. Mild bilateral lateral recess narrowing. L5-S1: Bulging of the disc. Facet hypertrophy. Mild to moderate stenosis of both lateral recesses. S1-2: Transitional level. Facet hypertrophy. Mild narrowing of the subarticular lateral recess on the right because of osteophytic encroachment. IMPRESSION: 1. CT THORACIC SPINE IMPRESSION Acute inferior endplate fracture at T8 with loss of height of about 20%. No retropulsed bone. 2. CT LUMBAR SPINE IMPRESSION Acute superior endplate fracture at L3 with loss of height of 20%. Mild posterior bowing of the posterosuperior margin of L3 related to the fracture. No compressive encroachment upon the canal. No other lumbosacral injury identified, through S3. 3. Other lumbar degenerative changes as outlined above. Aortic Atherosclerosis (ICD10-I70.0). Electronically Signed   By: Nelson Chimes M.D.   On:  01/04/2021 22:05   DG Chest Portable 1 View  Result Date: 12/10/2020 CLINICAL DATA:  Post chest tube insertion EXAM: PORTABLE CHEST 1 VIEW COMPARISON:  12/16/2020, 03/19/2019 FINDINGS: Interval insertion of right-sided chest tube with pigtail over the medial mid right thorax. Decreased size of right pneumothorax with trace residual at the apex and probable residual pneumothorax at the right mid to lower lung. Suspected deep sulcus on the right consistent with residual pneumothorax. Endotracheal tube tip about 4.6 cm superior to the carina. Left lung grossly clear. Multiple right rib fractures. IMPRESSION: 1. Interval placement of right-sided chest tube with decreased size of right pneumothorax. Small residual pneumothorax at the right apex and right mid to lower lung and right CP angle. 2. Endotracheal tube tip about 4.6 cm superior to the carina. Electronically Signed   By: Donavan Foil M.D.   On: 12/30/2020 23:20   DG Chest Port 1 View  Result Date: 12/10/2020 CLINICAL DATA:  Status post cardiac arrest. EXAM: PORTABLE CHEST 1 VIEW COMPARISON:  Mar 26, 2013. FINDINGS: The heart size and mediastinal contours are within normal limits. Endotracheal tube is in good position. Left lung is clear. Moderate size right apical and basilar pneumothorax is noted. No pleural effusion is noted. The visualized skeletal structures are unremarkable. IMPRESSION: Moderate size right apical and basilar pneumothorax. Critical Value/emergent results were called by telephone at the time  of interpretation on 12/08/2020 at 9:30 pm to provider Eliott Nine, RN, who verbally acknowledged these results. Electronically Signed   By: Marijo Conception M.D.   On: 12/20/2020 21:30    Procedures .Critical Care Performed by: Lorelle Gibbs, DO Authorized by: Lorelle Gibbs, DO   Critical care provider statement:    Critical care time (minutes):  50   Critical care was time spent personally by me on the following activities:   Discussions with consultants, evaluation of patient's response to treatment, examination of patient, ordering and performing treatments and interventions, ordering and review of laboratory studies, ordering and review of radiographic studies, pulse oximetry, re-evaluation of patient's condition, obtaining history from patient or surrogate and review of old charts   I assumed direction of critical care for this patient from another provider in my specialty: no   CHEST TUBE INSERTION  Date/Time: 12/10/2020 11:29 PM Performed by: Lorelle Gibbs, DO Authorized by: Lorelle Gibbs, DO   Consent:    Consent obtained:  Emergent situation Universal protocol:    Patient identity confirmed:  Arm band Pre-procedure details:    Skin preparation:  Chlorhexidine Sedation:    Sedation type:  None Anesthesia:    Anesthesia method:  Local infiltration   Local anesthetic:  Lidocaine 1% w/o epi Procedure details:    Placement location:  R lateral   Scalpel size:  11   Tube size (Fr):  Minicatheter   Tube connected to:  Water seal   Suture material:  2-0 silk   Dressing:  4x4 sterile gauze, petrolatum-impregnated gauze and Xeroform gauze Post-procedure details:    Post-insertion x-ray findings: tube in good position     Procedure completion:  Tolerated Comments:     Resolved pneumothorax     Medications Ordered in ED Medications  lidocaine (PF) (XYLOCAINE) 1 % injection (has no administration in time range)  0.9 %  sodium chloride infusion ( Intravenous Stopped 12/27/2020 2254)  iohexol (OMNIPAQUE) 300 MG/ML solution 100 mL (100 mLs Intravenous Contrast Given 12/22/2020 2157)  sodium bicarbonate injection 50 mEq (50 mEq Intravenous Given 12/31/2020 2236)    ED Course  I have reviewed the triage vital signs and the nursing notes.  Pertinent labs & imaging results that were available during my care of the patient were reviewed by me and considered in my medical decision making (see chart for  details).    MDM Rules/Calculators/A&P                          80 year old male presents the emergency department post cardiac arrest.  He was intubated, arrived tachycardic and hypertensive.  Chest x-ray shows a right pneumothorax, pelvis is stable.  CT imaging shows no acute finding in the head or neck.  There are multiple fractures in the thoracic and lumbar.  CT the chest abdomen pelvis reiterates moderate right pneumothorax, right rib fracture, sternal fracture.  Right pigtail tube placed, repeat chest x-ray shows resolution of pneumothorax.  ICU consulted and will admit the patient.  The patient's son was contacted, as of right now he is full code.  He is available by the phone number listed in the chart.  Hypotension improved after pigtail placement.  Final Clinical Impression(s) / ED Diagnoses Final diagnoses:  Cardiac arrest ([place])  Pneumothorax, unspecified type    Rx / DC Orders ED Discharge Orders    None       Lorelle Gibbs, DO 12/11/2020 2348

## 2021-01-02 ENCOUNTER — Inpatient Hospital Stay (HOSPITAL_COMMUNITY): Payer: Medicare Other

## 2021-01-02 DIAGNOSIS — J939 Pneumothorax, unspecified: Secondary | ICD-10-CM

## 2021-01-02 DIAGNOSIS — G931 Anoxic brain damage, not elsewhere classified: Secondary | ICD-10-CM | POA: Diagnosis not present

## 2021-01-02 DIAGNOSIS — Y848 Other medical procedures as the cause of abnormal reaction of the patient, or of later complication, without mention of misadventure at the time of the procedure: Secondary | ICD-10-CM | POA: Diagnosis present

## 2021-01-02 DIAGNOSIS — Z7189 Other specified counseling: Secondary | ICD-10-CM | POA: Diagnosis not present

## 2021-01-02 DIAGNOSIS — S32039A Unspecified fracture of third lumbar vertebra, initial encounter for closed fracture: Secondary | ICD-10-CM | POA: Diagnosis present

## 2021-01-02 DIAGNOSIS — I499 Cardiac arrhythmia, unspecified: Secondary | ICD-10-CM | POA: Diagnosis not present

## 2021-01-02 DIAGNOSIS — C3412 Malignant neoplasm of upper lobe, left bronchus or lung: Secondary | ICD-10-CM | POA: Diagnosis present

## 2021-01-02 DIAGNOSIS — N182 Chronic kidney disease, stage 2 (mild): Secondary | ICD-10-CM | POA: Diagnosis present

## 2021-01-02 DIAGNOSIS — I480 Paroxysmal atrial fibrillation: Secondary | ICD-10-CM | POA: Diagnosis present

## 2021-01-02 DIAGNOSIS — Z515 Encounter for palliative care: Secondary | ICD-10-CM | POA: Diagnosis not present

## 2021-01-02 DIAGNOSIS — J9600 Acute respiratory failure, unspecified whether with hypoxia or hypercapnia: Secondary | ICD-10-CM | POA: Diagnosis present

## 2021-01-02 DIAGNOSIS — Z66 Do not resuscitate: Secondary | ICD-10-CM | POA: Diagnosis not present

## 2021-01-02 DIAGNOSIS — G40409 Other generalized epilepsy and epileptic syndromes, not intractable, without status epilepticus: Secondary | ICD-10-CM

## 2021-01-02 DIAGNOSIS — G9341 Metabolic encephalopathy: Secondary | ICD-10-CM | POA: Diagnosis present

## 2021-01-02 DIAGNOSIS — J9602 Acute respiratory failure with hypercapnia: Secondary | ICD-10-CM

## 2021-01-02 DIAGNOSIS — I462 Cardiac arrest due to underlying cardiac condition: Secondary | ICD-10-CM | POA: Diagnosis not present

## 2021-01-02 DIAGNOSIS — M9689 Other intraoperative and postprocedural complications and disorders of the musculoskeletal system: Secondary | ICD-10-CM | POA: Diagnosis present

## 2021-01-02 DIAGNOSIS — I2510 Atherosclerotic heart disease of native coronary artery without angina pectoris: Secondary | ICD-10-CM | POA: Diagnosis present

## 2021-01-02 DIAGNOSIS — J9601 Acute respiratory failure with hypoxia: Secondary | ICD-10-CM

## 2021-01-02 DIAGNOSIS — I469 Cardiac arrest, cause unspecified: Secondary | ICD-10-CM | POA: Diagnosis not present

## 2021-01-02 DIAGNOSIS — S22069A Unspecified fracture of T7-T8 vertebra, initial encounter for closed fracture: Secondary | ICD-10-CM | POA: Diagnosis present

## 2021-01-02 DIAGNOSIS — S2220XA Unspecified fracture of sternum, initial encounter for closed fracture: Secondary | ICD-10-CM | POA: Diagnosis present

## 2021-01-02 DIAGNOSIS — R578 Other shock: Secondary | ICD-10-CM | POA: Diagnosis present

## 2021-01-02 DIAGNOSIS — N179 Acute kidney failure, unspecified: Secondary | ICD-10-CM

## 2021-01-02 DIAGNOSIS — E872 Acidosis: Secondary | ICD-10-CM | POA: Diagnosis present

## 2021-01-02 DIAGNOSIS — G253 Myoclonus: Secondary | ICD-10-CM | POA: Diagnosis present

## 2021-01-02 DIAGNOSIS — Z789 Other specified health status: Secondary | ICD-10-CM

## 2021-01-02 DIAGNOSIS — G9349 Other encephalopathy: Secondary | ICD-10-CM | POA: Diagnosis not present

## 2021-01-02 DIAGNOSIS — J439 Emphysema, unspecified: Secondary | ICD-10-CM | POA: Diagnosis not present

## 2021-01-02 DIAGNOSIS — E875 Hyperkalemia: Secondary | ICD-10-CM | POA: Diagnosis present

## 2021-01-02 DIAGNOSIS — Z20822 Contact with and (suspected) exposure to covid-19: Secondary | ICD-10-CM | POA: Diagnosis present

## 2021-01-02 DIAGNOSIS — J9811 Atelectasis: Secondary | ICD-10-CM | POA: Diagnosis not present

## 2021-01-02 DIAGNOSIS — M069 Rheumatoid arthritis, unspecified: Secondary | ICD-10-CM | POA: Diagnosis present

## 2021-01-02 DIAGNOSIS — J449 Chronic obstructive pulmonary disease, unspecified: Secondary | ICD-10-CM | POA: Diagnosis present

## 2021-01-02 DIAGNOSIS — S270XXA Traumatic pneumothorax, initial encounter: Secondary | ICD-10-CM | POA: Diagnosis present

## 2021-01-02 DIAGNOSIS — I129 Hypertensive chronic kidney disease with stage 1 through stage 4 chronic kidney disease, or unspecified chronic kidney disease: Secondary | ICD-10-CM | POA: Diagnosis present

## 2021-01-02 DIAGNOSIS — J9690 Respiratory failure, unspecified, unspecified whether with hypoxia or hypercapnia: Secondary | ICD-10-CM | POA: Diagnosis not present

## 2021-01-02 LAB — POCT I-STAT 7, (LYTES, BLD GAS, ICA,H+H)
Acid-base deficit: 8 mmol/L — ABNORMAL HIGH (ref 0.0–2.0)
Bicarbonate: 15.6 mmol/L — ABNORMAL LOW (ref 20.0–28.0)
Calcium, Ion: 1.1 mmol/L — ABNORMAL LOW (ref 1.15–1.40)
HCT: 34 % — ABNORMAL LOW (ref 39.0–52.0)
Hemoglobin: 11.6 g/dL — ABNORMAL LOW (ref 13.0–17.0)
O2 Saturation: 98 %
Patient temperature: 34.3
Potassium: 2.7 mmol/L — CL (ref 3.5–5.1)
Sodium: 140 mmol/L (ref 135–145)
TCO2: 16 mmol/L — ABNORMAL LOW (ref 22–32)
pCO2 arterial: 24 mmHg — ABNORMAL LOW (ref 32.0–48.0)
pH, Arterial: 7.408 (ref 7.350–7.450)
pO2, Arterial: 95 mmHg (ref 83.0–108.0)

## 2021-01-02 LAB — BASIC METABOLIC PANEL
Anion gap: 22 — ABNORMAL HIGH (ref 5–15)
Anion gap: 21 — ABNORMAL HIGH (ref 5–15)
Anion gap: 17 — ABNORMAL HIGH (ref 5–15)
BUN: 17 mg/dL (ref 8–23)
BUN: 18 mg/dL (ref 8–23)
BUN: 19 mg/dL (ref 8–23)
CO2: 14 mmol/L — ABNORMAL LOW (ref 22–32)
CO2: 15 mmol/L — ABNORMAL LOW (ref 22–32)
CO2: 17 mmol/L — ABNORMAL LOW (ref 22–32)
Calcium: 8.3 mg/dL — ABNORMAL LOW (ref 8.9–10.3)
Calcium: 8.5 mg/dL — ABNORMAL LOW (ref 8.9–10.3)
Calcium: 8.7 mg/dL — ABNORMAL LOW (ref 8.9–10.3)
Chloride: 103 mmol/L (ref 98–111)
Chloride: 105 mmol/L (ref 98–111)
Chloride: 107 mmol/L (ref 98–111)
Creatinine, Ser: 2.05 mg/dL — ABNORMAL HIGH (ref 0.61–1.24)
Creatinine, Ser: 2.01 mg/dL — ABNORMAL HIGH (ref 0.61–1.24)
Creatinine, Ser: 2.06 mg/dL — ABNORMAL HIGH (ref 0.61–1.24)
GFR, Estimated: 32 mL/min — ABNORMAL LOW (ref >60)
GFR, Estimated: 33 mL/min — ABNORMAL LOW (ref >60)
GFR, Estimated: 32 mL/min — ABNORMAL LOW (ref >60)
Glucose, Bld: 218 mg/dL — ABNORMAL HIGH (ref 70–99)
Glucose, Bld: 222 mg/dL — ABNORMAL HIGH (ref 70–99)
Glucose, Bld: 179 mg/dL — ABNORMAL HIGH (ref 70–99)
Potassium: 3.2 mmol/L — ABNORMAL LOW (ref 3.5–5.1)
Potassium: 3 mmol/L — ABNORMAL LOW (ref 3.5–5.1)
Potassium: 3 mmol/L — ABNORMAL LOW (ref 3.5–5.1)
Sodium: 139 mmol/L (ref 135–145)
Sodium: 141 mmol/L (ref 135–145)
Sodium: 141 mmol/L (ref 135–145)

## 2021-01-02 LAB — CBC
HCT: 39.4 % (ref 39.0–52.0)
Hemoglobin: 12.9 g/dL — ABNORMAL LOW (ref 13.0–17.0)
MCH: 33.5 pg (ref 26.0–34.0)
MCHC: 32.7 g/dL (ref 30.0–36.0)
MCV: 102.3 fL — ABNORMAL HIGH (ref 80.0–100.0)
Platelets: 270 10*3/uL (ref 150–400)
RBC: 3.85 MIL/uL — ABNORMAL LOW (ref 4.22–5.81)
RDW: 13 % (ref 11.5–15.5)
WBC: 12.9 10*3/uL — ABNORMAL HIGH (ref 4.0–10.5)
nRBC: 0 % (ref 0.0–0.2)

## 2021-01-02 LAB — ECHOCARDIOGRAM COMPLETE
Height: 70 in
S' Lateral: 3 cm
Single Plane A4C EF: 41 %
Weight: 2567.92 oz

## 2021-01-02 LAB — GLUCOSE, CAPILLARY
Glucose-Capillary: 210 mg/dL — ABNORMAL HIGH (ref 70–99)
Glucose-Capillary: 251 mg/dL — ABNORMAL HIGH (ref 70–99)
Glucose-Capillary: 231 mg/dL — ABNORMAL HIGH (ref 70–99)
Glucose-Capillary: 231 mg/dL — ABNORMAL HIGH (ref 70–99)
Glucose-Capillary: 170 mg/dL — ABNORMAL HIGH (ref 70–99)
Glucose-Capillary: 154 mg/dL — ABNORMAL HIGH (ref 70–99)

## 2021-01-02 LAB — PHOSPHORUS: Phosphorus: 3.6 mg/dL (ref 2.5–4.6)

## 2021-01-02 LAB — TROPONIN I (HIGH SENSITIVITY)
Troponin I (High Sensitivity): 86 ng/L — ABNORMAL HIGH (ref <18)
Troponin I (High Sensitivity): 181 ng/L (ref <18)
Troponin I (High Sensitivity): 232 ng/L (ref <18)

## 2021-01-02 LAB — MAGNESIUM: Magnesium: 1.6 mg/dL — ABNORMAL LOW (ref 1.7–2.4)

## 2021-01-02 LAB — HEMOGLOBIN A1C
Hgb A1c MFr Bld: 5.6 % (ref 4.8–5.6)
Mean Plasma Glucose: 114.02 mg/dL

## 2021-01-02 LAB — MRSA PCR SCREENING: MRSA by PCR: NEGATIVE

## 2021-01-02 LAB — STREP PNEUMONIAE URINARY ANTIGEN: Strep Pneumo Urinary Antigen: NEGATIVE

## 2021-01-02 LAB — APTT: aPTT: 49 seconds — ABNORMAL HIGH (ref 24–36)

## 2021-01-02 LAB — PROTIME-INR
INR: 1.7 — ABNORMAL HIGH (ref 0.8–1.2)
Prothrombin Time: 18.9 seconds — ABNORMAL HIGH (ref 11.4–15.2)

## 2021-01-02 MED ORDER — POTASSIUM CHLORIDE 10 MEQ/100ML IV SOLN
10.0000 meq | INTRAVENOUS | Status: AC
  Administered 2021-01-02 (×4): 10 meq via INTRAVENOUS
  Filled 2021-01-02 (×4): qty 100

## 2021-01-02 MED ORDER — LORAZEPAM 2 MG/ML IJ SOLN
INTRAMUSCULAR | Status: AC
  Administered 2021-01-02: 4 mg via INTRAVENOUS
  Filled 2021-01-02: qty 2

## 2021-01-02 MED ORDER — LEVETIRACETAM IN NACL 1000 MG/100ML IV SOLN
1000.0000 mg | Freq: Two times a day (BID) | INTRAVENOUS | Status: DC
  Filled 2021-01-02: qty 100

## 2021-01-02 MED ORDER — MAGNESIUM SULFATE 2 GM/50ML IV SOLN
2.0000 g | Freq: Once | INTRAVENOUS | Status: AC
  Administered 2021-01-02: 2 g via INTRAVENOUS
  Filled 2021-01-02: qty 50

## 2021-01-02 MED ORDER — FENTANYL CITRATE (PF) 100 MCG/2ML IJ SOLN
50.0000 ug | Freq: Once | INTRAMUSCULAR | Status: AC
  Administered 2021-01-02: 50 ug via INTRAVENOUS
  Filled 2021-01-02: qty 2

## 2021-01-02 MED ORDER — INSULIN ASPART 100 UNIT/ML ~~LOC~~ SOLN
0.0000 [IU] | SUBCUTANEOUS | Status: DC
  Administered 2021-01-02: 2 [IU] via SUBCUTANEOUS
  Administered 2021-01-02 (×2): 3 [IU] via SUBCUTANEOUS
  Administered 2021-01-02: 2 [IU] via SUBCUTANEOUS

## 2021-01-02 MED ORDER — SODIUM CHLORIDE 0.9 % IV SOLN
750.0000 mg | Freq: Two times a day (BID) | INTRAVENOUS | Status: DC
  Filled 2021-01-02 (×3): qty 7.5

## 2021-01-02 MED ORDER — ORAL CARE MOUTH RINSE
15.0000 mL | OROMUCOSAL | Status: DC
  Administered 2021-01-02 – 2021-01-03 (×16): 15 mL via OROMUCOSAL

## 2021-01-02 MED ORDER — SODIUM CHLORIDE 0.9% FLUSH
10.0000 mL | Freq: Three times a day (TID) | INTRAVENOUS | Status: DC
  Administered 2021-01-02 – 2021-01-03 (×5): 10 mL

## 2021-01-02 MED ORDER — LEVETIRACETAM IN NACL 1500 MG/100ML IV SOLN
1500.0000 mg | Freq: Once | INTRAVENOUS | Status: AC
  Administered 2021-01-02: 1500 mg via INTRAVENOUS
  Filled 2021-01-02: qty 100

## 2021-01-02 MED ORDER — POLYVINYL ALCOHOL 1.4 % OP SOLN
1.0000 [drp] | OPHTHALMIC | Status: DC | PRN
  Filled 2021-01-02: qty 15

## 2021-01-02 MED ORDER — CHLORHEXIDINE GLUCONATE 0.12% ORAL RINSE (MEDLINE KIT)
15.0000 mL | Freq: Two times a day (BID) | OROMUCOSAL | Status: DC
  Administered 2021-01-02 – 2021-01-03 (×3): 15 mL via OROMUCOSAL

## 2021-01-02 MED ORDER — SODIUM CHLORIDE 0.9 % IV BOLUS
500.0000 mL | Freq: Once | INTRAVENOUS | Status: AC
  Administered 2021-01-02: 500 mL via INTRAVENOUS

## 2021-01-02 MED ORDER — PANTOPRAZOLE SODIUM 40 MG IV SOLR
40.0000 mg | Freq: Every day | INTRAVENOUS | Status: DC
  Administered 2021-01-02 (×2): 40 mg via INTRAVENOUS
  Filled 2021-01-02 (×2): qty 40

## 2021-01-02 MED ORDER — FENTANYL BOLUS VIA INFUSION
25.0000 ug | INTRAVENOUS | Status: DC | PRN
  Filled 2021-01-02: qty 25

## 2021-01-02 MED ORDER — CHLORHEXIDINE GLUCONATE CLOTH 2 % EX PADS
6.0000 | MEDICATED_PAD | Freq: Every day | CUTANEOUS | Status: DC
  Administered 2021-01-03: 6 via TOPICAL

## 2021-01-02 MED ORDER — NOREPINEPHRINE 4 MG/250ML-% IV SOLN
0.0000 ug/min | INTRAVENOUS | Status: DC
  Administered 2021-01-02 (×2): 4 ug/min via INTRAVENOUS
  Filled 2021-01-02: qty 250

## 2021-01-02 MED ORDER — LORAZEPAM 2 MG/ML IJ SOLN: 4.0000 mg | Freq: Once | INTRAMUSCULAR | Status: AC

## 2021-01-02 MED ORDER — POTASSIUM CHLORIDE 10 MEQ/100ML IV SOLN
10.0000 meq | INTRAVENOUS | Status: AC
  Administered 2021-01-02 – 2021-01-03 (×8): 10 meq via INTRAVENOUS
  Filled 2021-01-02 (×8): qty 100

## 2021-01-02 MED ORDER — PROPOFOL 1000 MG/100ML IV EMUL
25.0000 ug/kg/min | INTRAVENOUS | Status: DC
  Administered 2021-01-02: 25 ug/kg/min via INTRAVENOUS
  Administered 2021-01-02: 20 ug/kg/min via INTRAVENOUS
  Administered 2021-01-03 (×3): 40 ug/kg/min via INTRAVENOUS
  Filled 2021-01-02 (×7): qty 100

## 2021-01-02 MED ORDER — MIDAZOLAM 50MG/50ML (1MG/ML) PREMIX INFUSION
0.5000 mg/h | INTRAVENOUS | Status: DC
  Administered 2021-01-02: 0.5 mg/h via INTRAVENOUS
  Filled 2021-01-02: qty 50

## 2021-01-02 MED ORDER — SODIUM CHLORIDE 0.9 % IV BOLUS
1000.0000 mL | Freq: Once | INTRAVENOUS | Status: AC
  Administered 2021-01-02: 1000 mL via INTRAVENOUS

## 2021-01-02 MED ORDER — MIDAZOLAM 50MG/50ML (1MG/ML) PREMIX INFUSION: 0.5000 mg/h | INTRAVENOUS | Status: DC

## 2021-01-02 MED ORDER — LORAZEPAM 2 MG/ML IJ SOLN: 4.0000 mg | INTRAMUSCULAR | Status: DC | PRN

## 2021-01-02 MED ORDER — SODIUM CHLORIDE 0.9 % IV SOLN
3.0000 g | Freq: Two times a day (BID) | INTRAVENOUS | Status: DC
  Administered 2021-01-02 – 2021-01-03 (×4): 3 g via INTRAVENOUS
  Filled 2021-01-02 (×4): qty 8
  Filled 2021-01-02: qty 3
  Filled 2021-01-02 (×2): qty 8
  Filled 2021-01-02: qty 3

## 2021-01-02 NOTE — Progress Notes (Signed)
EEG complete - results pending 

## 2021-01-02 NOTE — Progress Notes (Signed)
Pharmacy Antibiotic Note  Gary [name] is a 80 y.o. male admitted on 12/30/2020 with cardiac arrest.  Pharmacy has been consulted for Unasyn dosing for aspiration pneumonia. WBC is elevated. Noted renal dysfunction. CT chest with likely aspiration.   Plan: Unasyn 3g IV q12h Trend WBC, temp, renal function  F/U infectious work-up   Height: 5\' 10"  (177.8 cm) Weight: 74.8 kg (164 lb 14.5 oz) IBW/kg (Calculated) : 73  Temp (24hrs), Avg:95.4 F (35.2 C), Min:94.3 F (34.6 C), Max:97.3 F (36.3 C)  Recent Labs  Lab 12/31/2020 2104 12/31/2020 2315  WBC 16.5*  --   CREATININE 2.42*  --   LATICACIDVEN >11.0* >11.0*    Estimated Creatinine Clearance: 25.6 mL/min (A) (by C-G formula based on SCr of 2.42 mg/dL (H)).    Allergies  Allergen Reactions  . Bee Venom Swelling  . Cellcept [Mycophenolate Mofetil] Other (See Comments)    Reaction unknown  . Colcrys [Colchicine] Other (See Comments)    myalgias  . Erythromycin Other (See Comments)    Reaction unknown  . Methotrexate Derivatives Hives, Itching and Rash    Only in tablet form  . Neosporin [Neomycin-Bacitracin Zn-Polymyx] Rash  . Trazodone Palpitations    Weakness, loss of appetite  . Ultram [Tramadol] Rash    Narda Bonds, PharmD, BCPS Clinical Pharmacist Phone: [phone]

## 2021-01-02 NOTE — Progress Notes (Signed)
[facility] Progress Note Patient Name: Gary [name] DOB: 1940/12/23 MRN: [id number]   Date of Service  01/02/2021  HPI/Events of Note  ABG on 40%/PRVC 20/TV 580/P 5 = 7.408/24.0/95.  eICU Interventions  Continue present ventilator management.      Intervention Category Major Interventions: Respiratory failure - evaluation and management  Charese Abundis Eugene 01/02/2021, 2:47 AM

## 2021-01-02 NOTE — Progress Notes (Signed)
[facility] Progress Note Patient Name: Gary [name] DOB: 04-Sep-1941 MRN: [id number]   Date of Service  01/02/2021  HPI/Events of Note  Multiple issues: 1. K+ = 3.0, Mg++ = 1.6 and Creatinine = 2.01 and 2. Hyperglycemia - blood glucose = 251.   eICU Interventions  Plan: 1. Will replace Mg++ and K+. 2. Q 4 hour sensitive Novolog SSI,     Intervention Category Major Interventions: Hyperglycemia - active titration of insulin therapy;Electrolyte abnormality - evaluation and management  Lounette Sloan Eugene 01/02/2021, 4:21 AM

## 2021-01-02 NOTE — Plan of Care (Signed)

## 2021-01-02 NOTE — Progress Notes (Signed)
PCCM Interval Progress Note  80 yo admitted 2/25 for PEA arrest unknown downtime. On LTM -- notified of frequent myoclonic seizure activity.  SE, myoclonic -unfortunately I suspect this may be reflective of anoxic injury in setting of cardiac arrest P -4mg  ativan IV now -add versed gtt to currently infusing propofol gtt -PRN ativan -Keppra 1g BID  -cont EEG    Eliseo Gum MSN, AGACNP-BC [place] [phone] If no answer, [phone] 01/02/2021, 6:51 PM

## 2021-01-02 NOTE — Consult Note (Signed)
Neurology Consultation Reason for Consult: Myoclonic seizure activity Requesting Physician: June Leap  CC: PEA arrest   History is obtained from: Chart review and primary team,   HPI: Gary [name] is a 80 y.o. 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 His son had last spoken to him the day prior to admission. Downtime was up to 60 minutes as never had seen the patient's daughter locked 1 hour prior to discovering him with the door open and patient slumped over. On EMS arrival he was in PEA arrest, received 3 rounds of CPR and 3 rounds of epinephrine.  On arrival work-up was notable for trauma to the sternum, ribs, L3 and T8 spine, c/b right pneumothroax as well as a new left upper lung mass, and he required placement of right-sided chest tube. He was noted to have a gag on OG tube placement but was otherwise unresponsive with a GCS of 3, for which he was intubated without requiring sedation. He is being treated with normothermia protocol and temperatures have ranged from 34.2-36.3. Heart rates have largely been in the 60s-80s with excursions to 120, blood pressures have been largely in the 110s/80s  Routine EEG demonstrated myoclonic seizure activity for which neurology was consulted, and the patient was given 4 mg of Ativan, Versed added to his propofol sedation, and started on Keppra 1 g twice daily. He was also started on continuous EEG.  ROS: Unable to  obtain due to altered mental status.   Past Medical History:  Diagnosis Date  . Anemia    "chronic anemic"  . Anginal pain ([place])   . Anxiety   . 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 Cancer ([place])    pre-cancerous lesions removed 11/22/12 and hx of  . Cancer ([place]) 10/01/12   R retromolar trigone, squamous cell  . Chronic back pain    hx of  . Chronic kidney disease    renal insufficiency, [place] kidney  . Coronary artery disease    stent placement 2000, does not see cardiologist at this time  . Dyslipidemia   . Full dentures    Edentulous since age [age]  . GERD (gastroesophageal reflux disease)   . Gout    pseudo gout  . Hearing impairment    job related, hbilat hearing aids  . Hepatitis    hepatitis "C"  . History of radiation therapy 03/11/2013   thru -04/23/2013;  right retromolar trigone/54Gy/2fx  . Hypertension    sees Dr. Deland Pretty  . Hypertension   . Hypothyroid   . Insomnia    cpap  . Iron deficiency anemia 05/24/2019  . Rheumatoid arthritis ([place])    "all over" (09/13/2016)  . Sleep apnea    "tried mask 3 different times; couldn't tolerate it" (09/13/2016)  . Thyroid disease    Hypothyroidism   Past Surgical History:  Procedure Laterality Date  . CARDIAC CATHETERIZATION N/A 09/13/2016   Procedure: Left Heart Cath and Coronary Angiography;  Surgeon: Adrian Prows, MD;  Location: [facility];  Service: Cardiovascular;  Laterality: N/A;  . CARDIAC CATHETERIZATION N/A 10/11/2016   Procedure: Coronary Stent Intervention;  Surgeon: Adrian Prows, MD;  Location: [facility];  Service: Cardiovascular;  Laterality: N/A;  LAD & 1st Diag  . CATARACT EXTRACTION W/ INTRAOCULAR LENS  IMPLANT, BILATERAL Bilateral 2007  . CHOLECYSTECTOMY N/A 03/20/2019   Procedure: LAPAROSCOPIC CHOLECYSTECTOMY;  Surgeon: Clovis Riley, MD;  Location: [facility];  Service: General;  Laterality: N/A;  . CORONARY ANGIOPLASTY  09/13/2016  . CORONARY  ANGIOPLASTY WITH STENT PLACEMENT  2002  . FRACTURE SURGERY    . HIP PINNING,CANNULATED Left 12/22/2012   Procedure: CANNULATED HIP PINNING;  Surgeon: Mcarthur Rossetti, MD;  Location: [facility];  Service: Orthopedics;  Laterality: Left;  [name] MULTIPLE TOOTH EXTRACTIONS  ~ 1962   Edentulous  . RADICAL NECK DISSECTION Right 01/22/2013   [facility]  . SKIN CANCER EXCISION     "face, ears"  . SKIN GRAFT Left ~ 1961   "grafted skin off my stomach & put it on left hand that I had caught in fan belt"  . TONSILLECTOMY     Current Outpatient Medications  Medication Instructions  . acyclovir (ZOVIRAX) 400 mg, Oral, 2 times daily  . amLODipine (NORVASC) 10 mg, Oral, Daily  . atorvastatin (LIPITOR) 40 mg, Oral, Daily  . Calcium Carbonate-Vitamin D (CALCIUM-D) 600-400 MG-UNIT TABS 1 tablet, Oral, Daily  . 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 diazepam (VALIUM) 5 mg, Oral, Every 12 hours PRN, For anxiety  . EPINEPHrine (EPI-PEN) 0.3 mg, Intramuscular, As directed  . ferrous sulfate 220 (44 Fe) MG/5ML solution No dose, route, or frequency recorded.  . Glucosamine 500 MG CAPS 1 capsule, Oral, Daily  . hydrocortisone 2.5 % cream 1 application, Topical, 2 times daily PRN  . lactose free nutrition (BOOST) LIQD 237 mLs, Oral, 3 times daily between meals, As needed   . levothyroxine (SYNTHROID) 100 mcg, Oral, Daily  . levothyroxine (SYNTHROID) 88 mcg, Oral, Every morning  . lisinopril (ZESTRIL) 5 mg, Oral, Daily  . magnesium oxide (MAG-OX) 400 mg, Oral, Daily at bedtime  . Meclizine HCl 25 mg, Oral, 3 times daily PRN  . mirtazapine (REMERON) 7.5 mg, Oral, Daily at bedtime  . Multiple Vitamin (MULTIVITAMIN WITH MINERALS) TABS tablet 1 tablet, Oral, Daily  . nitroGLYCERIN (NITROSTAT) 0.4 MG SL tablet TAKE 1 TABLET SUBLINGUALLY EVERY 5 MINUTES AS NEEDED FOR CHEST PAIN UP TO 3 DOSES THEN CALL 911  . Omega-3 1000 MG CAPS 2 capsules, Oral, Daily   . oxyCODONE (ROXICODONE) 15 mg, Oral, 4 times daily PRN  . pantoprazole (PROTONIX) 40 mg, Oral, 2 times daily  . Polyethyl Glycol-Propyl Glycol (SYSTANE) 0.4-0.3 % GEL ophthalmic gel 1 application, Both Eyes, As needed  . polyethylene glycol (MIRALAX / GLYCOLAX) 17 g, Oral, Daily  . predniSONE (DELTASONE) 5 mg, Oral, Daily at bedtime  . propranolol ER (INDERAL LA) 80 mg, Oral, Daily  . sertraline (ZOLOFT) 100 mg, Oral, Daily at bedtime  . Vitamin D3 2,000 Units, Oral, Daily  . Xarelto 15 mg, Oral, Daily     Family History  Problem Relation Age of Onset  . Coronary artery disease Brother 60  . Heart attack Brother   . Coronary artery disease Father        stents in 70's  . Cancer Mother        bone   Social History:  reports that he quit smoking about 37 years ago. His  smoking use included cigarettes. He has a 27.00 pack-year smoking history. He has never used smokeless tobacco. He reports current alcohol use. He reports that he does not use drugs.  Exam: Current vital signs: BP 110/82   Pulse 68   Temp (!) 96.6 F (35.9 C) (Bladder)   Resp 20   Ht 5\' 10"  (1.778 m)   Wt 72.8 kg   SpO2 99%   BMI 23.03 kg/m  Vital signs in last 24 hours: Temp:  [93.56 F (34.2 C)-97.3 F (36.3 C)] 96.6 F (35.9 C) (02/26 1800) Pulse Rate:  [25-126] 68 (02/26 1800) Resp:  [9-28] 20 (02/26 1800) BP: (84-140)/(60-102) 110/82 (02/26 1800) SpO2:  [96 %-100 %] 99 % (02/26 1800) FiO2 (%):  [40 %-100 %] 40 % (02/26 1600) Weight:  [72.8 kg-74.8 kg] 72.8 kg (02/26 0115)   Physical Exam  Constitutional: Appears pale and thin, actively on targeted temperature management to 36 Psych: Not interactive Eyes: Scleral edema is absent  HENT: ET tube in place, c-collar in place, EEG wires in place MSK: Bruising of the chest, chest tube in place, otherwise no obvious limb trauma Cardiovascular: Normal rate and regular rhythm.  Respiratory: Breathing comfortably on the ventilator, at the ventilator  set rate of 20  GI: Soft.  No distension. Skin: Warm dry and intact visible skin.  There is no significant edema  Neuro: Mental Status: Does not open eyes spontaneously, to voice or noxious stimulation Does not follow any commands Cranial Nerves: II: No blink to threat. Pupils are equal, round, and reactive to light, sluggishly and minimally (2-1.5) III,IV, VI/VIII: VOR not tested given c-collar in place and significant spinal trauma V/VII: Facial sensation is absent to corneal stimulation VIII: No response to voice X/XI: Absent cough/gag XII: Unable to assess tongue protrusion secondary to patient's mental status  Motor/Sensory: Tone is normal. Bulk is normal.  No movement to noxious stim in any of the extremities Deep Tendon Reflexes: 2+ bilateral biceps, absent in the patellars Plantars: Toes are mute bilaterally.  Cerebellar: Unable to assess secondary to patient's mental status    I have reviewed labs in epic and the results pertinent to this consultation are: Creatinine was 2.4 on admission increased from baseline of 1.4-1.75, now improved to 2.6, GFR currently 32  Leukocytosis to 16.5 on admission, hemoglobin 12.9 (macrocytic), platelets 424 (increased from baseline of 286) CBC most recently with WBC 12.9, hemoglobin 12.9, platelets 270  Troponin gradually rising from 57 on arrival to 232 as of 1638 on 2/26 INR elevated at 1.7 UA notable for large hemoglobin, and mild glucosuria, otherwise negative for infection  Hemoglobin A1c 5.6%  Strep pneumo antigen negative Blood cultures negative to date  Lactate greater than 11 on arrival  EEG demonstrated continuous generalized background suppression and bursts of generalized polyspikes lasting 2 seconds at a time happening approximately once per minute, with lack of reaction to noxious stimulation; EEG at this time has been completely suppressed.  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  Given his age, unknown downtime, prognosis is extremely guarded however cannot formally prognosticate until after rewarming.  To maximize chance of recovery at this time, neurology will follow along and help with seizure management.  Active problems -Myoclonic seizures -S/p cardiac arrest -GCS 3 on arrival -AKI on CKD -Atrial fibrillation -Baseline significant hearing loss  -Bilateral rib fractures, sternal fracture, L3 and T8 fractures  Recommendations: -Continue EEG monitoring -Continue Versed and propofol at this time to be titrated by neurology for burst suppression -Load with 1500 mg Keppra once, followed by 750 mg twice daily given creatinine clearance currently 30 Neurology will continue to dose adjust for renal function as needed Estimated Creatinine Clearance: 29.9 mL/min (A) (by C-G formula based on SCr of 2.06 mg/dL (H)).   CrCl 80 to 130 mL/minute/1.73 m2: 500 mg to 1.5 g every 12 hours.  CrCl 50 to <80 mL/minute/1.73 m2: 500 mg to 1 g every 12 hours.  CrCl 30 to <50 mL/minute/1.73 m2: 250 to 750 mg every 12 hours.  CrCl 15 to <30 mL/minute/1.73 m2: 250 to 500 mg every 12 hours.  CrCl <15  mL/minute/1.73 m2: 250 to 500 mg every 24 hours (expert opinion). -Once Keppra is loaded, will half current rate of versed and continue to monitor EEG  -Appreciate primary team's excellent management of his coexisting medical conditions, agree with targeted temperature management  Lesleigh Noe MD-PhD [facility] [phone] Available 7 PM to 7 AM, outside of these hours please call Neurologist on call as listed on Amion.  Total critical care time: 60 minutes   Critical care time was exclusive of separately billable procedures and treating other patients.   Critical care was necessary to treat or prevent imminent or life-threatening deterioration.   Critical care was time spent personally by me on the following activities: development of treatment plan with patient and/or surrogate as well as nursing, discussions with consultants/primary team, evaluation of patient's response to treatment, examination of patient, obtaining history from patient or surrogate, ordering and performing treatments and interventions, ordering and review of laboratory studies, ordering and review of radiographic studies, and re-evaluation of patient's condition as needed, as documented above.

## 2021-01-02 NOTE — Progress Notes (Signed)
[place] Progress Note Patient Name: Gary [name] DOB: Dec 12, 1940 MRN: [id number]   Date of Service  01/02/2021  HPI/Events of Note  Multiple liquid stools - Nursing request for Flexiseal.   eICU Interventions  Plan: 1. Place Flexiseal.     Intervention Category Major Interventions: Other:  Diezel Mazur Cornelia Copa 01/02/2021, 1:50 AM

## 2021-01-02 NOTE — Procedures (Addendum)
Patient Name: Gary [name]  MRN: [id number]  Epilepsy Attending: Lora Havens  Referring Physician/Provider: Dr Brand Males Date: 01/02/2021 Duration: 22.49 mins  Patient history: 80yo M s/p cardiac arrest. EEG to evaluate for seizure  Level of alertness:  comatose  AEDs during EEG study: Propofol  Technical aspects: This EEG study was done with scalp electrodes positioned according to the 10-20 International system of electrode placement. Electrical activity was acquired at a sampling rate of 500Hz  and reviewed with a high frequency filter of 70Hz  and a low frequency filter of 1Hz . EEG data were recorded continuously and digitally stored.   Description: EEG showed continuous generalized background suppression. Patient was also noted to have episode of sudden brief eye opening. Concomitant eeg showed burst of generalized sharply contoured polyspikes admixed with 3-5hz  theta-delta activity lasting about 2 seconds, avg 1/minute. EEG was not reactiev to noxious stimulation. Hyperventilation and photic stimulation were not performed.     ABNORMALITY -Myoclonic seizure, generalized - Background suppression, generalized  IMPRESSION: This study showed myoclonic seizure, avg 1/minute as well as profound diffuse encephalopathy. 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   Dr Valeta Harms was notified.  Gary [name] Gary [name]

## 2021-01-02 NOTE — Progress Notes (Signed)
Consult for removal of IO: Discussed with RN, recommend leaving IO in place for up to 24 hours as long as it is patent.

## 2021-01-02 NOTE — Progress Notes (Signed)
RT NOTE:  Pt transported to [place] without event. Report given to [facility], RT.

## 2021-01-02 NOTE — Progress Notes (Signed)
Gary [name], Hoang Pettingill, updated about seizure activity. Understands and appreciates the update. Will call in the morning for an update.

## 2021-01-02 NOTE — Progress Notes (Signed)
  Echocardiogram 2D Echocardiogram has been performed.  Johny Chess 01/02/2021, 2:56 PM

## 2021-01-02 NOTE — Progress Notes (Signed)
[place] Progress Note Patient Name: Gary [name] DOB: July 15, 1941 MRN: [id number]   Date of Service  01/02/2021  HPI/Events of Note  Oliguria - No CVL or CVP. Last Hgb = 12.9. LVEF = 55-60%.  eICU Interventions  Plan: 1. Bolus with 0.9 NaCl 500 mL IV over 30 minutes now.      Intervention Category Major Interventions: Other:  Lysle Dingwall 01/02/2021, 10:23 PM

## 2021-01-02 NOTE — Progress Notes (Signed)
NAME:  Gary [name], MRN:  [id number], DOB:  [birth year], LOS: 0 ADMISSION DATE:  12/16/2020, CONSULTATION DATE: 12/08/2020 REFERRING MD: EDP at [facility], CHIEF COMPLAINT: PEA arrest unknown downtime  Brief History:  Pea arrest  History of Present Illness: History from EDP  80 year old male who apparently lives alone but is quite functional.  Unknown downtime but apparently son spoke to him 1 day before admission on 12/31/2020.  Neighbor saw door was locked at 6.54pm 12/18/2020 but an hour later 7.50pm door was open and was found slumped over a rocking chair unknown downtime but possibly max 60 minutes.  PEA upon EMS arrival.  CPR x3 with epi x3.  No shock.  Upon arrival into the ER was hypotensive.  Significant evidence of trauma to the sternum and bilateral ribs and also L3 and T8 spine with evidence of sinusitis and new left upper lobe mass 2.3 cm groundglass opacity and bruising in the area of the abdomen where he was slumped over.  No history of trauma or fall available.  His blood pressure improved upon EDP placing right-sided pigtail chest tube.  Largely unresponsive except did have a gag reflex when ER nurse placed OG tube.  CCM called for admission.  Son is aware of the admission and talking to family about CODE STATUS.  Labs on admission showed possible AKI and anemia and hyperkalemia  He appears to have a history of rheumatoid arthritis, radiation therapy, coronary artery disease with status post angioplasty in 2017 by Dr. 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   reports that he quit smoking about 37 years ago. His smoking use included cigarettes. He has a 27.00 pack-year smoking history. He has never used smokeless tobacco.  Past Surgical History:  Procedure Laterality Date  . CARDIAC CATHETERIZATION N/A 09/13/2016   Procedure: Left Heart Cath and Coronary Angiography;  Surgeon: Adrian Prows, MD;  Location: [place] [facility];  Service: Cardiovascular;  Laterality: N/A;  . CARDIAC CATHETERIZATION N/A 10/11/2016   Procedure: Coronary Stent Intervention;  Surgeon: Adrian Prows, MD;  Location: [place] [facility];  Service: Cardiovascular;  Laterality: N/A;  LAD & 1st Diag  . CATARACT EXTRACTION W/ INTRAOCULAR LENS  IMPLANT, BILATERAL Bilateral 2007  . CHOLECYSTECTOMY N/A 03/20/2019   Procedure: LAPAROSCOPIC CHOLECYSTECTOMY;  Surgeon: Clovis Riley, MD;  Location: [facility];  Service: General;  Laterality: N/A;  . CORONARY ANGIOPLASTY  09/13/2016  . CORONARY ANGIOPLASTY WITH STENT PLACEMENT  2002  . FRACTURE SURGERY    . HIP PINNING,CANNULATED Left 12/22/2012   Procedure: CANNULATED HIP PINNING;  Surgeon: Mcarthur Rossetti, MD;  Location: [place];  Service: Orthopedics;  Laterality: Left;  [name] MULTIPLE TOOTH EXTRACTIONS  ~ 1962   Edentulous  . RADICAL NECK DISSECTION Right 01/22/2013   [facility]  . SKIN CANCER EXCISION     "face, ears"  . SKIN GRAFT Left ~ 1961   "grafted skin off my stomach & put it on left hand that I had caught in fan belt"  . TONSILLECTOMY      Allergies  Allergen Reactions  . Bee Venom Swelling  .  Cellcept [Mycophenolate Mofetil] Other (See Comments)    Reaction unknown  . Colcrys [Colchicine] Other (See Comments)    myalgias  . Erythromycin Other (See Comments)    Reaction unknown  . Lisinopril     Other reaction(s): Unknown  . Methotrexate Derivatives Hives, Itching and Rash    Only in tablet form  . Neosporin [Neomycin-Bacitracin  Zn-Polymyx] Rash  . Trazodone Palpitations    Weakness, loss of appetite  . Ultram [Tramadol] Rash    Immunization History  Administered Date(s) Administered  . Influenza Whole 08/07/2012  . Influenza, High Dose Seasonal PF 09/03/2013, 07/09/2019  . Influenza-Unspecified 09/07/2018  . Zoster Recombinat (Shingrix) 04/03/2019, 06/19/2019    Family History  Problem Relation Age of Onset  . Coronary artery disease Brother 2  . Heart attack Brother   . Coronary artery disease Father        stents in 70's  . Cancer Mother        bone     Current Facility-Administered Medications:  .  Ampicillin-Sulbactam (UNASYN) 3 g in sodium chloride 0.9 % 100 mL IVPB, 3 g, Intravenous, Q12H, Erenest Blank, RPH, Last Rate: 200 mL/hr at 01/02/21 1000, 3 g at 01/02/21 1000 .  chlorhexidine gluconate (MEDLINE KIT) (PERIDEX) 0.12 % solution 15 mL, 15 mL, Mouth Rinse, BID, Ramaswamy, Murali, MD, 15 mL at 01/02/21 0755 .  Chlorhexidine Gluconate Cloth 2 % PADS 6 each, 6 each, Topical, Daily, Ramaswamy, Murali, MD .  fentaNYL (SUBLIMAZE) bolus via infusion 25 mcg, 25 mcg, Intravenous, Q30 min PRN, Chase Caller, Murali, MD .  insulin aspart (novoLOG) injection 0-9 Units, 0-9 Units, Subcutaneous, Q4H, Anders Simmonds, MD, 3 Units at 01/02/21 [phone] .  MEDLINE mouth rinse, 15 mL, Mouth Rinse, 10 times per day, Brand Males, MD, 15 mL at 01/02/21 0935 .  norepinephrine (LEVOPHED) 15m in 2586mpremix infusion, 0-50 mcg/min, Intravenous, Titrated, RaBrand MalesMD, Held at 01/02/21 0036 .  pantoprazole (PROTONIX) injection 40 mg, 40 mg, Intravenous, QHS, Ramaswamy, Murali, MD, 40 mg at 01/02/21 0031 .  propofol (DIPRIVAN) 1000 MG/100ML infusion, 25-80 mcg/kg/min, Intravenous, Continuous, Ramaswamy, Murali, MD, Last Rate: 8.98 mL/hr at 01/02/21 1000, 20 mcg/kg/min at 01/02/21 1000 .  sodium chloride flush (NS) 0.9 % injection 10 mL, 10 mL, Intracatheter, Q8H, Ramaswamy, Murali, MD, 10 mL at 01/02/21  [facility]  [facility]   Consults:  x  Procedures:  12/11/2020 - ETT 12/11/2020  - left tibial intraosseous 12/10/2020  - rt pig tail chest tube by ER for ptx  Significant Diagnostic Tests:      CT Head Wo Contrast and CT C spine  Result Date: 12/20/2020  IMPRESSION: 1. No acute intracranial abnormality. 2. Findings consistent with age related atrophy and chronic small vessel ischemia 3.  No acute fracture or malalignment of the spine. 4. Chronic sinusitis with debris/masslike area within the right maxillary sinus and sphenoid sinus 5. Right apical pneumothorax, better evaluated on CT chest same day Electronically Signed   By: BiPrudencio Pair.D.   On: 12/13/2020 22:08   CT CHEST ABDOMEN PELVIS W CONTRAST   IMPRESSION: 1. Moderate to large volume right pneumothorax.  2. Likely bilateral lower lobe aspiration pneumonia. 3. Interval development of a 1.5 cm subpleural and a 2.3 cm ground-glass left upper lobe pulmonary nodules. Adenocarcinoma not excluded. . 4. Associated acute minimally displaced (fifth rib is full shaft with displaced) right rib fractures involving the anterolateral third through eighth ribs. 5. Acute minimally  displaced (sixth rib is displaced) left rib fractures involving the anterolateral third through ninth ribs. No definite left pneumothorax. 6. Acute sternal fracture. 7. Fast transition state of the large bowel. 8. Please see separately dictated CT thoracic and lumbar spine 12/11/2020. Electronically Signed   By: Iven Finn M.D.   On: 12/27/2020 22:23   CT T-SPINE NO CHARGE  Result Date: 12/23/2020  1. CT THORACIC SPINE IMPRESSION Acute inferior endplate fracture at T8 with loss of height of about 20%. No retropulsed bone. 2. CT LUMBAR SPINE IMPRESSION Acute superior endplate fracture at L3 with loss of height of 20%. Mild posterior bowing of the posterosuperior margin of L3 related to the fracture. No compressive encroachment upon  the canal. No other lumbosacral injury identified, through S3. 3. Other lumbar degenerative changes as outlined above. Aortic Atherosclerosis (ICD10-I70.0). Electronically Signed   By: Nelson Chimes M.D.   On: 12/14/2020 22:05     Micro Data:  x  Antimicrobials:  Empiric unasyn   Interim History / Subjective:   Critically ill, intubated on life support and cooling   Objective   Blood pressure 94/76, pulse 85, temperature (!) 96.4 F (35.8 C), temperature source Bladder, resp. rate 11, height _0  (1.778 m), weight 72.8 kg, SpO2 98 %.    Vent Mode: PRVC FiO2 (%):  [40 %-100 %] 40 % Set Rate:  [15 bmp-20 bmp] 20 bmp Vt Set:  [580 mL] 580 mL PEEP:  [5 cmH20] 5 cmH20 Plateau Pressure:  [11 cmH20-14 cmH20] 11 cmH20   Intake/Output Summary (Last 24 hours) at 01/02/2021 1118 Last data filed at 01/02/2021 1000 Gross per 24 hour  Intake 4584.18 ml  Output 1349 ml  Net 3235.18 ml   Filed Weights   12/09/2020 2235 01/02/21 0115  Weight: 74.8 kg 72.8 kg    Examination: General: eldlery male, intubated on life support, cooling pads  HENT: ETT in place  Lungs: right chest drain in place  Cardiovascular: RRR, s1 s2  Abdomen: soft, nt nd  Extremities: no edema  Neuro: unresponsive to voice, opens eyes to stimulation  GU: deferred   ICD-10 prob list   Patient Active Problem List   Diagnosis Date Noted  . Limited code status, all resuscitation measures excluding chest compressions 01/02/2021  . PEA (Pulseless electrical activity) ([place]) 01/02/2021  . Poor prognosis 01/02/2021  . Goals of care, counseling/discussion 01/02/2021  . AKI (acute kidney injury) ([place]) 01/02/2021  . Acute respiratory failure ([place]) 01/02/2021  . Pneumothorax, right 01/02/2021  . Cardiac arrest ([place]) 01/02/2021  . Iron deficiency anemia 05/24/2019  . Acute cholecystitis 03/19/2019  . Post PTCA 09/13/2016  . Exposed mandible 05/20/2014  . . 03/26/2013  . COPD with acute exacerbation ([place]) 03/26/2013   . Pulmonary nodule 01/06/2013  . Diastolic dysfunction, with good LVF 2D Feb 2014 12/25/2012  . Chronic anticoagulation, new Feb 2014- Xarelto 12/25/2012  . PAF, new Feb 2014 with SSS component 12/24/2012  . 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 Hip fracture, left, S/P surgical repair 12/22/12 12/21/2012  . Coronary artery disease   . HTN (hypertension)   . Anxiety   . Sleep apnea   . Chronic kidney disease. stage 2   . GERD (gastroesophageal reflux disease)   . RA (rheumatoid arthritis) ([place])   . Gout   . Anemia   . Hepatitis   . Chronic back pain   . Dyslipidemia   . Hypothyroid   . CAD, LAD stent 2002  Resolved Hospital Problem list   x  Assessment & Plan:    Acute respiratory failure in the setting of PEA arrest 12/31/2020  Post CPR discovery of large right tension pneumothorax associated with bilateral rib fractures and sternal fractures -status post pigtail tube thoracostomy by EDP  Bilateral lower lobe consolidation upon arrival to the ER 12/20/2020 raising concern for infectious pneumonia or aspiration  Left upper lobe mass 1.2 cm groundglass opacity at admission 01/02/2021 - RA nodule v lung cancer P:   Full vent support  Adult vent protocol  VAP ppx  PAD sedation   Acute metabolic encephalopathy secondary to above Comatose post cardiac arrest.   Initial CT head 12/31/2020 without evidence of anoxia P:   Normothermia protocol  EEG follow up pending    Post cardiac arrest circulatory shock resolved with fluids and right-sided chest tube P:  MAP goals >22mmHg   PEA arrest 12/19/2020 -primary indication for admission History of coronary artery disease status post angioplasty in 2017 by Dr. Einar Gip P: Follow troponins ECHO   Covid negative.  CT chest with bilateral lower lobe consolidation and CT sinus with evidence of sinusitis P:   Continue unasyn   Home ace inhibitor intake CKD with a baseline creatinine of 1.5/1.75 in the summer  2020 AKI creatinine 2.42 mg percent versus progressive CKD at admission 12/11/2020 P: Fluid bolus and monitor No ace inhibitor   Severe metabolic acidosis at admission likely due to CPR Plan Follow UOP   Hyperkalemia P: Follow electrolytes   At risk for stress ulcer bleed Transaminitis - likely shock liver PPI  Xaretlo at home ? indication Anemia admission that is gotten worse.  No clear-cut evidence of bleed P:  Transfuse as needed  At risk for hypo and hyperglycemia Chronic steroids at home P:   SSI Sress dose steroid if hypotension recurs  Hx of rheumatoid arthritis - Chronic pred Hx of pseudogout Sternal fracture, L3 fracture, T8 fracture and bilateral rib fracture -?  Due to CPR Intraosseous line placed 12/27/2020  Best practice (evaluated daily)  Diet: npo but will need TF Pain/Anxiety/Delirium protocol (if indicated): per protocol VAP protocol (if indicated): yes DVT prophylaxis: scd -> anticog once bleed risk subside GI prophylaxis: ppi Glucose control: ssi Mobility: bed rest Disposition:from ER to ICU  Goals of Care:   Multi-Disciplinary Goals of Care Discussion -pending  Family Updates:  -    Goals of care over phone Son Jeniel Slauson [phone] updaetd 11:18 AM  - says patient would not want to be on life support. Son and his brother discussed -> partial code now but regardngn terminal wean v continued active care my advise was to see what neuro course looks like which is few to several days. Prognosis in very poor   LABS    PULMONARY Recent Labs  Lab 12/16/2020 2149 01/02/21 0157  PHART 7.183* 7.408  PCO2ART 33.1 24.0*  PO2ART 411* 95  HCO3 12.6* 15.6*  TCO2 14* 16*  O2SAT 100.0 98.0    CBC Recent Labs  Lab 12/14/2020 2104 12/13/2020 2149 01/02/21 0157 01/02/21 0213  HGB 12.9* 9.5* 11.6* 12.9*  HCT 42.7 28.0* 34.0* 39.4  WBC 16.5*  --   --  12.9*  PLT 424*  --   --  270    COAGULATION Recent Labs  Lab 12/22/2020 2104 01/02/21 0041   INR 1.6* 1.7*    CARDIAC  No results for input(s): TROPONINI in the last 168 hours. No results for input(s): PROBNP in  the last 168 hours.   CHEMISTRY Recent Labs  Lab 12/24/2020 2104 01/02/2021 2149 01/02/21 0041 01/02/21 0157 01/02/21 0213  NA 138 136 139 140 141  K 5.2* 3.8 3.2* 2.7* 3.0*  CL 100  --  103  --  105  CO2 11*  --  14*  --  15*  GLUCOSE 253*  --  218*  --  222*  BUN 18  --  17  --  18  CREATININE 2.42*  --  2.05*  --  2.01*  CALCIUM 9.1  --  8.3*  --  8.5*  MG  --   --   --   --  1.6*  PHOS  --   --   --   --  3.6   Estimated Creatinine Clearance: 30.7 mL/min (A) (by C-G formula based on SCr of 2.01 mg/dL (H)).   LIVER Recent Labs  Lab 12/13/2020 2104 01/02/21 0041  AST 1,022*  --   ALT 374*  --   ALKPHOS 94  --   BILITOT 2.2*  --   PROT 6.5  --   ALBUMIN 2.4*  --   INR 1.6* 1.7*     INFECTIOUS Recent Labs  Lab 12/12/2020 2104 12/08/2020 2315  LATICACIDVEN >11.0* >11.0*     ENDOCRINE CBG (last 3)  Recent Labs    01/02/21 0559 01/02/21 0742 01/02/21 1106  GLUCAP 231* 231* 170*     This patient is critically ill with multiple organ system failure; which, requires frequent high complexity decision making, assessment, support, evaluation, and titration of therapies. This was completed through the application of advanced monitoring technologies and extensive interpretation of multiple databases. During this encounter critical care time was devoted to patient care services described in this note for 34 minutes.  [facility] 01/02/2021 5:37 PM

## 2021-01-02 NOTE — Progress Notes (Signed)
Patient with 251mL urine total for shift. MD ICard made aware at 1813. No new orders. Eliseo Gum, ACNP made aware when at bedside to evaluate patient for seizure activity at [phone].

## 2021-01-02 NOTE — Care Plan (Addendum)
EEG suppressed, with 1-2 second bursts every 1-2 minutes, no significant change after reducing versed from 1 to 0.5 just after keppra load completed.  Patient borderline hypotensive with very poor urine output.  Will stop versed (23:45 PM) as this may be contributing to hypotension and can accumulate in the setting of renal dysfunction and continue to monitor EEG.   Addendum 3:30 AM, EEG with more frequent activity more concerning for seizure, will bolus 2 mg versed and resume prior dose of 1 mg/hr  Addendum 6:45 AM notified by nursing that patient's IV had infiltrated therefore propofol had NOT been reaching the patient.  In this setting he was having increased seizure activity (clinical episodes of eyes opening and rolling back, and reviewed EEG does have marked increase of aberrant activity as well).  Instructed nursing to give another 2 mg bolus Versed as well as loading phenobarbital (20 mg/kg).  Plan is to check a post load phenobarbital level 1 hour post load, with maintenance phenobarbital to be ordered based on level  Lesleigh Noe MD-PhD [facility] [phone] Available 7 PM to 7 AM, outside of these hours please call Neurologist on call as listed on Amion.

## 2021-01-02 NOTE — Progress Notes (Signed)
LTM EEG hooked up and running - no initial skin breakdown - push button tested - neuro notified. [facility] monitored vent button tested.

## 2021-01-03 DIAGNOSIS — I499 Cardiac arrhythmia, unspecified: Secondary | ICD-10-CM

## 2021-01-03 DIAGNOSIS — G9349 Other encephalopathy: Secondary | ICD-10-CM

## 2021-01-03 DIAGNOSIS — Z515 Encounter for palliative care: Secondary | ICD-10-CM | POA: Diagnosis not present

## 2021-01-03 DIAGNOSIS — Z7189 Other specified counseling: Secondary | ICD-10-CM | POA: Diagnosis not present

## 2021-01-03 DIAGNOSIS — J939 Pneumothorax, unspecified: Secondary | ICD-10-CM | POA: Diagnosis not present

## 2021-01-03 DIAGNOSIS — G931 Anoxic brain damage, not elsewhere classified: Secondary | ICD-10-CM

## 2021-01-03 DIAGNOSIS — I469 Cardiac arrest, cause unspecified: Secondary | ICD-10-CM | POA: Diagnosis not present

## 2021-01-03 LAB — BASIC METABOLIC PANEL
Anion gap: 13 (ref 5–15)
BUN: 23 mg/dL (ref 8–23)
CO2: 17 mmol/L — ABNORMAL LOW (ref 22–32)
Calcium: 8.4 mg/dL — ABNORMAL LOW (ref 8.9–10.3)
Chloride: 111 mmol/L (ref 98–111)
Creatinine, Ser: 2.34 mg/dL — ABNORMAL HIGH (ref 0.61–1.24)
GFR, Estimated: 28 mL/min — ABNORMAL LOW (ref >60)
Glucose, Bld: 105 mg/dL — ABNORMAL HIGH (ref 70–99)
Potassium: 4.3 mmol/L (ref 3.5–5.1)
Sodium: 141 mmol/L (ref 135–145)

## 2021-01-03 LAB — CBC
HCT: 33 % — ABNORMAL LOW (ref 39.0–52.0)
Hemoglobin: 11.7 g/dL — ABNORMAL LOW (ref 13.0–17.0)
MCH: 34 pg (ref 26.0–34.0)
MCHC: 35.5 g/dL (ref 30.0–36.0)
MCV: 95.9 fL (ref 80.0–100.0)
Platelets: 204 10*3/uL (ref 150–400)
RBC: 3.44 MIL/uL — ABNORMAL LOW (ref 4.22–5.81)
RDW: 13.2 % (ref 11.5–15.5)
WBC: 9.8 10*3/uL (ref 4.0–10.5)
nRBC: 0 % (ref 0.0–0.2)

## 2021-01-03 LAB — GLUCOSE, CAPILLARY
Glucose-Capillary: 101 mg/dL — ABNORMAL HIGH (ref 70–99)
Glucose-Capillary: 105 mg/dL — ABNORMAL HIGH (ref 70–99)
Glucose-Capillary: 108 mg/dL — ABNORMAL HIGH (ref 70–99)
Glucose-Capillary: 79 mg/dL (ref 70–99)
Glucose-Capillary: 80 mg/dL (ref 70–99)

## 2021-01-03 LAB — MAGNESIUM: Magnesium: 1.8 mg/dL (ref 1.7–2.4)

## 2021-01-03 LAB — PHENOBARBITAL LEVEL: Phenobarbital: 20.9 ug/mL (ref 15.0–30.0)

## 2021-01-03 MED ORDER — SODIUM CHLORIDE 0.9 % IV BOLUS
500.0000 mL | Freq: Once | INTRAVENOUS | Status: AC
  Administered 2021-01-03: 500 mL via INTRAVENOUS

## 2021-01-03 MED ORDER — POLYVINYL ALCOHOL 1.4 % OP SOLN
1.0000 [drp] | Freq: Four times a day (QID) | OPHTHALMIC | Status: DC | PRN
  Filled 2021-01-03: qty 15

## 2021-01-03 MED ORDER — PHENOBARBITAL SODIUM 130 MG/ML IJ SOLN: 20.0000 mg/kg | Freq: Once | INTRAMUSCULAR | Status: DC

## 2021-01-03 MED ORDER — DIPHENHYDRAMINE HCL 50 MG/ML IJ SOLN: 25.0000 mg | INTRAMUSCULAR | Status: DC | PRN

## 2021-01-03 MED ORDER — GLYCOPYRROLATE 0.2 MG/ML IJ SOLN: 0.4000 mg | Freq: Four times a day (QID) | INTRAMUSCULAR | Status: DC

## 2021-01-03 MED ORDER — HYDROMORPHONE HCL 1 MG/ML IJ SOLN
1.0000 mg | INTRAMUSCULAR | Status: DC | PRN
  Administered 2021-01-03: 1 mg via INTRAVENOUS
  Filled 2021-01-03: qty 1

## 2021-01-03 MED ORDER — MIDAZOLAM BOLUS VIA INFUSION
2.0000 mg | INTRAVENOUS | Status: DC | PRN
  Administered 2021-01-03 (×2): 2 mg via INTRAVENOUS
  Filled 2021-01-03: qty 2

## 2021-01-03 MED ORDER — SODIUM CHLORIDE 0.9 % IV SOLN
1400.0000 mg | Freq: Once | INTRAVENOUS | Status: AC
  Administered 2021-01-03: 1400 mg via INTRAVENOUS
  Filled 2021-01-03: qty 10.77

## 2021-01-04 LAB — LEGIONELLA PNEUMOPHILA SEROGP 1 UR AG: L. pneumophila Serogp 1 Ur Ag: NEGATIVE

## 2021-01-05 NOTE — Procedures (Addendum)
Patient Name: DEO MEHRINGER  MRN: [id number]  Epilepsy Attending: Lora Havens  Referring Physician/Provider: Dr Zeb Comfort Duration: 01/02/2021 1854 to 01/18/21 1645  Patient history: 80yo M s/p cardiac arrest. EEG to evaluate for seizure  Level of alertness:  comatose  AEDs during EEG study: Propofol, versed, LEV, PB  Technical aspects: This EEG study was done with scalp electrodes positioned according to the 10-20 International system of electrode placement. Electrical activity was acquired at a sampling rate of 500Hz  and reviewed with a high frequency filter of 70Hz  and a low frequency filter of 1Hz . EEG data were recorded continuously and digitally stored.   Description: EEG showed showed bursts suppression pattern with burst of generalized sharply contoured 3-5hz  theta-delta slowing admixed with polyspikes, every 2-4 minutes, lasting 1-2 seconds. At times patient was noted to have brief sudden eye opening during bursts consistent with myoclonic seizure. After around 0800 on 2021/01/18, EEG showed predominantly generalized background suppression. EEG was not reactive to noxious stimulation.   ABNORMALITY -Myoclonic seizure, generalized - Burst suppression, generalized  IMPRESSION: This study showed myoclonic seizure, avg 1 every 2-4 minutes as well as profound diffuse encephalopathy. 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   Benno Brensinger Barbra Sarks

## 2021-01-05 NOTE — Progress Notes (Addendum)
Neurology Progress Note   S:// Seen and examined this morning Hooked up to continuous EEG yesterday after the routine EEG showed myoclonic seizures and generalized background suppression. Was started on Versed and propofol drips and also loaded with Keppra, due to concern for ongoing myoclonic activity, loaded with phenobarbital. EEG became worse with lowering sedation-and also probably because of the line with propofol infiltrating, hence the sedation was increased. Currently on propofol and Versed drips Also being slowly rewarmed at this time.  O:// Current vital signs: BP 91/71   Pulse 76   Temp (!) 96.1 F (35.6 C)   Resp 20   Ht 5' 10"  (1.778 m)   Wt 72.8 kg   SpO2 97%   BMI 23.03 kg/m  Vital signs in last 24 hours: Temp:  [96.1 F (35.6 C)-97 F (36.1 C)] 96.1 F (35.6 C) (02/27 0500) Pulse Rate:  [68-90] 76 (02/27 0615) Resp:  [9-28] 20 (02/27 0615) BP: (84-155)/(62-105) 91/71 (02/27 0615) SpO2:  [96 %-99 %] 97 % (02/27 0615) FiO2 (%):  [40 %] 40 % (02/27 0426) General: Sedated intubated HEENT: ET tube in place, c-collar in place CVS: Regular rate rhythm Respiratory: Vented, rate set at 20 breathing at 20-21. Skin and extremities: Bruising of the chest, chest tube in place, some bruising on the arms. Abdomen soft nondistended nontender Neurological exam Sedated intubated Does not open eyes spontaneously Does not open eyes to commands Does not follow any commands Pupils are pinpoint and sluggishly reactive to light bilaterally.  Gaze is mildly disconjugate Breathing with the ventilator essentially No response to noxious stimulation Absent cough and gag    Medications  Current Facility-Administered Medications:  .  Ampicillin-Sulbactam (UNASYN) 3 g in sodium chloride 0.9 % 100 mL IVPB, 3 g, Intravenous, Q12H, Erenest Blank, RPH, Stopped at 01/02/21 2214 .  chlorhexidine gluconate (MEDLINE KIT) (PERIDEX) 0.12 % solution 15 mL, 15 mL, Mouth Rinse, BID,  Ramaswamy, Murali, MD, 15 mL at 01/02/21 2010 .  Chlorhexidine Gluconate Cloth 2 % PADS 6 each, 6 each, Topical, Daily, Ramaswamy, Murali, MD .  fentaNYL (SUBLIMAZE) bolus via infusion 25 mcg, 25 mcg, Intravenous, Q30 min PRN, Chase Caller, Murali, MD .  insulin aspart (novoLOG) injection 0-9 Units, 0-9 Units, Subcutaneous, Q4H, Anders Simmonds, MD, 2 Units at 01/02/21 1526 .  levETIRAcetam (KEPPRA) 750 mg in sodium chloride 0.9 % 100 mL IVPB, 750 mg, Intravenous, Q12H, Bhagat, Srishti L, MD .  MEDLINE mouth rinse, 15 mL, Mouth Rinse, 10 times per day, Brand Males, MD, 15 mL at 01-12-21 0412 .  midazolam (VERSED) 50 mg/50 mL (1 mg/mL) premix infusion, 0.5-10 mg/hr, Intravenous, Continuous, Bhagat, Srishti L, MD, Last Rate: 1 mL/hr at 12-Jan-2021 0350, 1 mg/hr at 2021-01-12 0350 .  midazolam (VERSED) bolus via infusion 2 mg, 2 mg, Intravenous, Q30 min PRN, Bhagat, Srishti L, MD, 2 mg at 2021/01/12 0650 .  norepinephrine (LEVOPHED) 23m in 2546mpremix infusion, 0-50 mcg/min, Intravenous, Titrated, RaBrand MalesMD, Stopped at 01/02/21 1955 .  pantoprazole (PROTONIX) injection 40 mg, 40 mg, Intravenous, QHS, Ramaswamy, Murali, MD, 40 mg at 01/02/21 2143 .  PHENObarbital (LUMINAL) 1,400 mg in sodium chloride 0.9 % 100 mL IVPB, 1,400 mg, Intravenous, Once, Icard, Bradley L, DO .  polyvinyl alcohol (LIQUIFILM TEARS) 1.4 % ophthalmic solution 1 drop, 1 drop, Both Eyes, PRN, Icard, Bradley L, DO .  propofol (DIPRIVAN) 1000 MG/100ML infusion, 25-80 mcg/kg/min, Intravenous, Continuous, Bhagat, Srishti L, MD, Last Rate: 17.95 mL/hr at 0203-08-2022646, 40 mcg/kg/min at 02Mar 08, 2022  2831 .  sodium chloride flush (NS) 0.9 % injection 10 mL, 10 mL, Intracatheter, Q8H, Ramaswamy, Murali, MD, 10 mL at 01/02/21 2351 Labs CBC    Component Value Date/Time   WBC 9.8 2021-01-25 0228   RBC 3.44 (L) 01-25-21 0228   HGB 11.7 (L) 01/25/21 0228   HGB 10.2 (L) 05/03/2019 1451   HCT 33.0 (L) 01/25/21 0228   PLT 204  01-25-21 0228   PLT 286 05/03/2019 1451   MCV 95.9 25-Jan-2021 0228   MCH 34.0 2021-01-25 0228   MCHC 35.5 01/25/21 0228   RDW 13.2 01-25-2021 0228   LYMPHSABS 7.1 (H) 01/02/2021 2104   MONOABS 0.3 12/18/2020 2104   EOSABS 0.8 (H) 01/02/2021 2104   BASOSABS 0.0 12/28/2020 2104    CMP     Component Value Date/Time   NA 141 01-25-21 0228   K 4.3 25-Jan-2021 0228   CL 111 2021-01-25 0228   CO2 17 (L) January 25, 2021 0228   GLUCOSE 105 (H) 2021-01-25 0228   BUN 23 01-25-21 0228   BUN 32.3 (H) 09/17/2013 1328   CREATININE 2.34 (H) 2021/01/25 0228   CREATININE 1.75 (H) 05/03/2019 1451   CREATININE 1.5 (H) 09/17/2013 1328   CALCIUM 8.4 (L) 01-25-2021 0228   PROT 6.5 12/26/2020 2104   ALBUMIN 2.4 (L) 12/25/2020 2104   AST 1,022 (H) 12/17/2020 2104   AST 26 05/03/2019 1451   ALT 374 (H) 12/27/2020 2104   ALT 12 05/03/2019 1451   ALKPHOS 94 12/10/2020 2104   BILITOT 2.2 (H) 12/16/2020 2104   BILITOT 0.5 05/03/2019 1451   GFRNONAA 28 (L) 01/25/21 0228   GFRNONAA 36 (L) 05/03/2019 1451   GFRAA 42 (L) 05/03/2019 1451    Imaging I have reviewed images in epic and the results pertinent to this consultation are: Well CT-scan of the brain with no acute changes and age-related atrophy and chronic small vessel ischemia. CT C-spine with multilevel DJD as well as the chest showing a right apical pneumothorax and subpleural bleb formation seen within the bilateral lung apices. CT chest abdomen pelvis with moderate to large volume right pneumothorax and likely bilateral lower lobe aspiration pneumonia Interval development of subpleural and groundglass left upper lobe pulmonary nodules-cannot rule out malignancy. Acute minimally displaced right rib fractures involving the anterolateral third through the eighth ribs.  Acute minimally displaced sixth rib fracture on the left with fractures involving the anterolateral third through ninth ribs on the left. CT thoracic and lumbar spine with  inferior endplate fracture at T8 and acute superior endplate fracture at L3.  Assessment: 80 year old with past medical history of multiple vascular risk factors including atrial fibrillation, hypertension, hyperlipidemia, coronary artery disease, former tobacco use presented with cardiac arrest complicated by significant trauma to the spine and ribs-unknown downtime.  Given the unknown downtime, presence of myoclonic seizures/status epilepticus so soon after the cardiac arrest, and EEG pattern with generalized background suppression although that might be iatrogenic -the first 2 portend a poor prognosis for neurologically meaningful recovery. For prognostication unfortunately, and patient is being cooled would not be prudent till about 72 hours after rewarming. In addition to suspected devastating anoxic brain injury, he also has multiple other issues including pneumothorax, multiple rib fractures and T8 and L3 spine fractures as well.  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  Recommendations: He is on Versed at 1 mg an hour.  Continue the  same rate for now but I am not sure if that is really going to help much given the low dose. He is also on propofol 40 MCG per KG per minute-continue for now. Phenobarbital load was given-check level Continue Keppra 750 twice daily Okay to hold anticoagulation given potential trauma and risk for bleeding at this time. We will follow up on the continuous EEG reading from overnight We will try to talk to family at some point during the day to discuss the above assessment and findings and to get a sense of how aggressive they want to be in his care.  Given his overall clinical picture, prognosis for survival appears bad but prognosis for meaningful neurological recovery, if he survives, would also be extremely poor. If  he continues to be full scope of treatment, an MRI in a day or so would be helpful to assess for the structural insult of the anoxic brain injury diagnosis. Discussed with Dr. Valeta Harms   -- Amie Portland, MD Neurologist [facility] Pager: [phone]   Addendum Spoke with the son earlier this morning-Rick Schoenbeck He understood the gravity of the situation and how sick his father is at this time and he said that he and his family are aware that he probably did not get enough oxygenation to his brain and are concerned about his neurological recovery. I told him that although formal prognostication may not be possible because of the hypothermia, given the prolonged downtime, his advanced age and comorbidities, his neurological meaningful recovery chances are bleak. He told me that his father would not want to live on artificial life support. Palliative care team has been involved in his care. I appreciate their help. I will be available for in person meeting if needed. For now, I will leave the LTM EEG on because it continues to show myoclonic seizures every few minutes along with burst suppression that is generalized. I am not sure if treating this more aggressively is of any use but we will follow along for now until goals of care discussion meeting has happened.  Addendum 4 PM Family meeting with PCCM and palliative care-patient now comfort measures only.  We will be available as needed.  CRITICAL CARE ATTESTATION Performed by: Amie Portland, MD Total critical care time: 50 minutes Critical care time was exclusive of separately billable procedures and treating other patients and/or supervising APPs/Residents/Students Critical care was necessary to treat or prevent imminent or life-threatening deterioration due to anoxic brain injury, myoclonic status epilepticus This patient is critically ill and at significant risk for neurological worsening and/or death and care requires  constant monitoring. Critical care was time spent personally by me on the following activities: development of treatment plan with patient and/or surrogate as well as nursing, discussions with consultants, evaluation of patient's response to treatment, examination of patient, obtaining history from patient or surrogate, ordering and performing treatments and interventions, ordering and review of laboratory studies, ordering and review of radiographic studies, pulse oximetry, re-evaluation of patient's condition, participation in multidisciplinary rounds and medical decision making of high complexity in the care of this patient.
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
## 2021-01-05 NOTE — Progress Notes (Signed)
NAME:  Gary [name], MRN:  [id number], DOB:  Dec 22, 1940, LOS: 1 ADMISSION DATE:  12/27/2020, CONSULTATION DATE: 12/12/2020 REFERRING MD: EDP at [facility], CHIEF COMPLAINT: PEA arrest unknown downtime  Brief History:  Pea arrest  History of Present Illness: History from EDP  80 year old male who apparently lives alone but is quite functional.  Unknown downtime but apparently son spoke to him 1 day before admission on 12/31/2020.  Neighbor saw door was locked at 6.54pm 12/31/2020 but an hour later 7.50pm door was open and was found slumped over a rocking chair unknown downtime but possibly max 60 minutes.  PEA upon EMS arrival.  CPR x3 with epi x3.  No shock.  Upon arrival into the ER was hypotensive.  Significant evidence of trauma to the sternum and bilateral ribs and also L3 and T8 spine with evidence of sinusitis and new left upper lobe mass 2.3 cm groundglass opacity and bruising in the area of the abdomen where he was slumped over.  No history of trauma or fall available.  His blood pressure improved upon EDP placing right-sided pigtail chest tube.  Largely unresponsive except did have a gag reflex when ER nurse placed OG tube.  CCM called for admission.  Son is aware of the admission and talking to family about CODE STATUS.  Labs on admission showed possible AKI and anemia and hyperkalemia  He appears to have a history of rheumatoid arthritis, radiation therapy, coronary artery disease with status post angioplasty in 2017 by Dr. 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   reports that he quit smoking about 37 years ago. His smoking use included cigarettes. He has a 27.00 pack-year smoking history. He has never used smokeless tobacco.  Past Surgical History:  Procedure Laterality Date  . CARDIAC CATHETERIZATION N/A 09/13/2016   Procedure: Left Heart Cath and Coronary Angiography;  Surgeon: Adrian Prows, MD;  Location: [facility];  Service: Cardiovascular;  Laterality: N/A;  . CARDIAC CATHETERIZATION N/A 10/11/2016   Procedure: Coronary Stent Intervention;  Surgeon: Adrian Prows, MD;  Location: [facility];  Service: Cardiovascular;  Laterality: N/A;  LAD & 1st Diag  . CATARACT EXTRACTION W/ INTRAOCULAR LENS  IMPLANT, BILATERAL Bilateral 2007  . CHOLECYSTECTOMY N/A 03/20/2019   Procedure: LAPAROSCOPIC CHOLECYSTECTOMY;  Surgeon: Clovis Riley, MD;  Location: [facility];  Service: General;  Laterality: N/A;  . CORONARY ANGIOPLASTY  09/13/2016  . CORONARY ANGIOPLASTY WITH STENT PLACEMENT  2002  . FRACTURE SURGERY    . HIP PINNING,CANNULATED Left 12/22/2012   Procedure: CANNULATED HIP PINNING;  Surgeon: Mcarthur Rossetti, MD;  Location: [facility];  Service: Orthopedics;  Laterality: Left;  [name] MULTIPLE TOOTH EXTRACTIONS  ~ 1962   Edentulous  . RADICAL NECK DISSECTION Right 01/22/2013   [facility]  . SKIN CANCER EXCISION     "face, ears"  . SKIN GRAFT Left ~ 1961   "grafted skin off my stomach & put it on left hand that I had caught in fan belt"  . TONSILLECTOMY      Allergies  Allergen Reactions  . Bee Venom Swelling  .  Cellcept [Mycophenolate Mofetil] Other (See Comments)    Reaction unknown  . Colcrys [Colchicine] Other (See Comments)    myalgias  . Erythromycin Other (See Comments)    Reaction unknown  . Lisinopril     Other reaction(s): Unknown  . Methotrexate Derivatives Hives, Itching and Rash    Only in tablet form  . Neosporin [Neomycin-Bacitracin  Zn-Polymyx] Rash  . Trazodone Palpitations    Weakness, loss of appetite  . Ultram [Tramadol] Rash    Immunization History  Administered Date(s) Administered  . Influenza Whole 08/07/2012  . Influenza, High Dose Seasonal PF 09/03/2013, 07/09/2019  . Influenza-Unspecified 09/07/2018  . Zoster Recombinat (Shingrix) 04/03/2019, 06/19/2019    Family History  Problem Relation Age of Onset  . Coronary artery disease Brother 56  . Heart attack Brother   . Coronary artery disease Father        stents in 70's  . Cancer Mother        bone     Current Facility-Administered Medications:  .  Ampicillin-Sulbactam (UNASYN) 3 g in sodium chloride 0.9 % 100 mL IVPB, 3 g, Intravenous, Q12H, Erenest Blank, RPH, Stopped at 01/02/21 2214 .  chlorhexidine gluconate (MEDLINE KIT) (PERIDEX) 0.12 % solution 15 mL, 15 mL, Mouth Rinse, BID, Ramaswamy, Murali, MD, 15 mL at January 14, 2021 0817 .  Chlorhexidine Gluconate Cloth 2 % PADS 6 each, 6 each, Topical, Daily, Brand Males, MD, 6 each at 14-Jan-2021 0830 .  fentaNYL (SUBLIMAZE) bolus via infusion 25 mcg, 25 mcg, Intravenous, Q30 min PRN, Ramaswamy, Murali, MD .  insulin aspart (novoLOG) injection 0-9 Units, 0-9 Units, Subcutaneous, Q4H, Anders Simmonds, MD, 2 Units at 01/02/21 1526 .  levETIRAcetam (KEPPRA) 750 mg in sodium chloride 0.9 % 100 mL IVPB, 750 mg, Intravenous, Q12H, Bhagat, Srishti L, MD .  MEDLINE mouth rinse, 15 mL, Mouth Rinse, 10 times per day, Brand Males, MD, 15 mL at 01/14/21 0600 .  midazolam (VERSED) 50 mg/50 mL (1 mg/mL) premix infusion, 0.5-10 mg/hr, Intravenous, Continuous, Bhagat, Srishti L, MD, Last Rate: 1 mL/hr at 2021/01/14 0700, 1 mg/hr at Jan 14, 2021 0700 .  midazolam (VERSED) bolus via infusion 2 mg, 2 mg, Intravenous, Q30 min PRN, Bhagat, Srishti L, MD, 2 mg at January 14, 2021 0650 .  norepinephrine (LEVOPHED) 27m in 2529mpremix infusion, 0-50 mcg/min, Intravenous, Titrated, RaBrand MalesMD, Stopped at 01/02/21 1955 .   pantoprazole (PROTONIX) injection 40 mg, 40 mg, Intravenous, QHS, Ramaswamy, Murali, MD, 40 mg at 01/02/21 2143 .  PHENObarbital (LUMINAL) 1,400 mg in sodium chloride 0.9 % 100 mL IVPB, 1,400 mg, Intravenous, Once, Jadd Gasior L, DO, Last Rate: 200 mL/hr at 0203-10-2022827, 1,400 mg at 022022/03/10827 .  polyvinyl alcohol (LIQUIFILM TEARS) 1.4 % ophthalmic solution 1 drop, 1 drop, Both Eyes, PRN, Joselin Crandell L, DO .  propofol (DIPRIVAN) 1000 MG/100ML infusion, 25-80 mcg/kg/min, Intravenous, Continuous, Bhagat, Srishti L, MD, Last Rate: 17.95 mL/hr at 0203/10/2022700, 40 mcg/kg/min at 0203/10/22700 .  sodium chloride flush (NS) 0.9 % injection 10 mL, 10 mL, Intracatheter, Q8H, Ramaswamy, Murali, MD, 10 mL at [id number]   Significant Hospital Events:  12/23/2020 - admit  Consults:  Neurology  Procedures:  12/11/2020 - ETT 12/22/2020  - left tibial intraosseous 12/30/2020  - rt pig tail chest tube by ER for ptx  Significant Diagnostic Tests:    CT Head Wo Contrast and CT C spine  Result Date: 12/08/2020  IMPRESSION: 1. No acute intracranial abnormality. 2. Findings consistent with age related atrophy  and chronic small vessel ischemia 3.  No acute fracture or malalignment of the spine. 4. Chronic sinusitis with debris/masslike area within the right maxillary sinus and sphenoid sinus 5. Right apical pneumothorax, better evaluated on CT chest same day Electronically Signed   By: Prudencio Pair M.D.   On: 12/12/2020 22:08   CT CHEST ABDOMEN PELVIS W CONTRAST   IMPRESSION: 1. Moderate to large volume right pneumothorax.  2. Likely bilateral lower lobe aspiration pneumonia. 3. Interval development of a 1.5 cm subpleural and a 2.3 cm ground-glass left upper lobe pulmonary nodules. Adenocarcinoma not excluded. . 4. Associated acute minimally displaced (fifth rib is full shaft with displaced) right rib fractures involving the anterolateral third through eighth ribs. 5. Acute minimally displaced (sixth  rib is displaced) left rib fractures involving the anterolateral third through ninth ribs. No definite left pneumothorax. 6. Acute sternal fracture. 7. Fast transition state of the large bowel. 8. Please see separately dictated CT thoracic and lumbar spine 12/15/2020. Electronically Signed   By: Iven Finn M.D.   On: 12/18/2020 22:23   CT T-SPINE NO CHARGE  Result Date: 12/16/2020  1. CT THORACIC SPINE IMPRESSION Acute inferior endplate fracture at T8 with loss of height of about 20%. No retropulsed bone. 2. CT LUMBAR SPINE IMPRESSION Acute superior endplate fracture at L3 with loss of height of 20%. Mild posterior bowing of the posterosuperior margin of L3 related to the fracture. No compressive encroachment upon the canal. No other lumbosacral injury identified, through S3. 3. Other lumbar degenerative changes as outlined above. Aortic Atherosclerosis (ICD10-I70.0). Electronically Signed   By: Nelson Chimes M.D.   On: 12/08/2020 22:05   EEG: 01/02/2021: Myoclonic seizure with diffuse encephalopathy concerning for anoxic brain injury.  Micro Data:  x  Antimicrobials:  Empiric unasyn   Interim History / Subjective:   Critically ill intubated on mechanical life support.  Ongoing myoclonic seizure despite benzodiazepine infusion.  Additional boluses of antiepileptics given by neurology overnight.  Patient on long-term video EEG.  Objective   Blood pressure 91/71, pulse 80, temperature (!) 97 F (36.1 C), temperature source Bladder, resp. rate 20, height 5' 10"  (1.778 m), weight 72.8 kg, SpO2 96 %.    Vent Mode: PRVC FiO2 (%):  [40 %] 40 % Set Rate:  [20 bmp] 20 bmp Vt Set:  [580 mL] 580 mL PEEP:  [5 cmH20] 5 cmH20 Plateau Pressure:  [11 cmH20-12 cmH20] 12 cmH20   Intake/Output Summary (Last 24 hours) at 29-Jan-2021 3016 Last data filed at Jan 29, 2021 0700 Gross per 24 hour  Intake 2157.54 ml  Output 450 ml  Net 1707.54 ml   Filed Weights   12/20/2020 2235 01/02/21 0115  Weight: 74.8  kg 72.8 kg    Examination: General: eldlery male, intubated on life support, cooling pads  HENT: ETT in place  Lungs: right chest drain in place  Cardiovascular: RRR, s1 s2  Abdomen: soft, nt nd  Extremities: no edema  Neuro: unresponsive to voice, opens eyes to stimulation  GU: deferred   ICD-10 prob list   Patient Active Problem List   Diagnosis Date Noted  . Limited code status, all resuscitation measures excluding chest compressions 01/02/2021  . PEA (Pulseless electrical activity) ([place]) 01/02/2021  . Poor prognosis 01/02/2021  . Goals of care, counseling/discussion 01/02/2021  . AKI (acute kidney injury) ([place]) 01/02/2021  . Acute respiratory failure ([place]) 01/02/2021  . Pneumothorax, right 01/02/2021  . Cardiac arrest ([place]) 01/02/2021  . Iron deficiency anemia 05/24/2019  .  Acute cholecystitis 03/19/2019  . Post PTCA 09/13/2016  . Exposed mandible 05/20/2014  . . 03/26/2013  . COPD with acute exacerbation ([place]) 03/26/2013  . Pulmonary nodule 01/06/2013  . Diastolic dysfunction, with good LVF 2D Feb 2014 12/25/2012  . Chronic anticoagulation, new Feb 2014- Xarelto 12/25/2012  . PAF, new Feb 2014 with SSS component 12/24/2012  . 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 Hip fracture, left, S/P surgical repair 12/22/12 12/21/2012  . Coronary artery disease   . HTN (hypertension)   . Anxiety   . Sleep apnea   . Chronic kidney disease. stage 2   . GERD (gastroesophageal reflux disease)   . RA (rheumatoid arthritis) ([place])   . Gout   . Anemia   . Hepatitis   . Chronic back pain   . Dyslipidemia   . Hypothyroid   . CAD, LAD stent 2002       Resolved Hospital Problem list   x  Assessment & Plan:    Acute respiratory failure in the setting of PEA arrest 12/11/2020  Post CPR discovery of large right tension pneumothorax associated with bilateral rib fractures and sternal fractures -status post pigtail tube thoracostomy by EDP  Bilateral lower lobe  consolidation upon arrival to the ER 01/04/2021 raising concern for infectious pneumonia or aspiration  Left upper lobe mass 1.2 cm groundglass opacity at admission 12/29/2020 - RA nodule v lung cancer P:   Remains on full vent support Adult mechanical vent protocol VAP prophylaxis PAD sedation guidelines Can on continuous benzodiazepines due to status epilepticus  Acute metabolic encephalopathy secondary to above Status epilepticus, myoclonic seizures Concern for anoxic brain injury postarrest P:   Continuous LTV EEG per neurology AEDs per neurology On continuous benzodiazepines and loaded with phenobarbital. Overall prognosis postarrest with these kind of findings is exceptionally poor. I have discussed with neurology this morning. Be helpful if they would reach out to patient's family to discuss findings.  Post cardiac arrest circulatory shock resolved with fluids and right-sided chest tube P:  Mean arterial pressure greater than 65 Pressors as needed Higher likelihood of developing hypotension post phenobarbital loading  PEA arrest 12/22/2020 -primary indication for admission History of coronary artery disease status post angioplasty in 2017 by Dr. Einar Gip P: Troponins Echo pending   Covid negative.  CT chest with bilateral lower lobe consolidation and CT sinus with evidence of sinusitis P:   Continue Unasyn  Home ace inhibitor intake CKD with a baseline creatinine of 1.5/1.75 in the summer 2020 AKI creatinine 2.42 mg percent versus progressive CKD at admission 12/23/2020 P: Holding ACE inhibitor Follow urine output Was given a fluid bolus last night  Severe metabolic acidosis at admission likely due to CPR Plan Follow urine output   Hyperkalemia P: Correct electrolytes as needed  At risk for stress ulcer bleed Transaminitis - likely shock liver PPI  On home Xarelto Anemia on admission P:  Conservative transfusion threshold for hemoglobin less than 7  At  risk for hypo and hyperglycemia Chronic steroids at home P:   CBG with SSI  Hx of rheumatoid arthritis - Chronic pred Hx of pseudogout Sternal fracture, L3 fracture, T8 fracture and bilateral rib fracture -?  Due to CPR Intraosseous line placed 12/14/2020  Best practice (evaluated daily)  Diet: npo but will need TF Pain/Anxiety/Delirium protocol (if indicated): per protocol VAP protocol (if indicated): yes DVT prophylaxis: scd -> anticog once bleed risk subside GI prophylaxis: ppi Glucose control: ssi Mobility: bed rest Disposition:from ER  to ICU  Goals of Care:   Multi-Disciplinary Goals of Care Discussion  Family Updates:  -    Goals of care over phone [place]  Lab 12/28/2020 2149 01/02/21 0157  PHART 7.183* 7.408  PCO2ART 33.1 24.0*  PO2ART 411* 95  HCO3 12.6* 15.6*  TCO2 14* 16*  O2SAT 100.0 98.0    CBC Recent Labs  Lab 12/22/2020 2104 12/14/2020 2149 01/02/21 0157 01/02/21 0213 18-Jan-2021 0228  HGB 12.9*   < > 11.6* 12.9* 11.7*  HCT 42.7   < > 34.0* 39.4 33.0*  WBC 16.5*  --   --  12.9* 9.8  PLT 424*  --   --  270 204   < > = values in this interval not displayed.    COAGULATION Recent Labs  Lab 12/18/2020 2104 01/02/21 0041  INR 1.6* 1.7*    CARDIAC  No results for input(s): TROPONINI in the last 168 hours. No results for input(s): PROBNP in the last 168 hours.   CHEMISTRY Recent Labs  Lab 12/09/2020 2104 12/08/2020 2149 01/02/21 0041 01/02/21 0157 01/02/21 0213 01/02/21 1215 Jan 18, 2021 0228  NA 138   < > 139 140 141 141 141  K 5.2*   < > 3.2* 2.7* 3.0* 3.0* 4.3  CL 100  --  103  --  105 107 111  CO2 11*  --  14*  --  15* 17* 17*  GLUCOSE 253*  --  218*  --  222* 179* 105*  BUN 18  --  17  --  18 19 23   CREATININE 2.42*  --  2.05*  --  2.01* 2.06* 2.34*  CALCIUM 9.1  --  8.3*  --  8.5* 8.7* 8.4*  MG  --   --   --   --  1.6*  --  1.8  PHOS  --   --   --   --  3.6  --   --    < > =  values in this interval not displayed.   Estimated Creatinine Clearance: 26.4 mL/min (A) (by C-G formula based on SCr of 2.34 mg/dL (H)).   LIVER Recent Labs  Lab 12/25/2020 2104 01/02/21 0041  AST 1,022*  --   ALT 374*  --   ALKPHOS 94  --   BILITOT 2.2*  --   PROT 6.5  --   ALBUMIN 2.4*  --   INR 1.6* 1.7*     INFECTIOUS Recent Labs  Lab 12/16/2020 2104 12/10/2020 2315  LATICACIDVEN >11.0* >11.0*     ENDOCRINE CBG (last 3)  Recent Labs    18-Jan-2021 0002 2021/01/18 0353 01-18-21 0739  GLUCAP 101* 105* 79     This patient is critically ill with multiple organ system failure; which, requires frequent high complexity decision making, assessment, support, evaluation, and titration of therapies. This was completed through the application of advanced monitoring technologies and extensive interpretation of multiple databases. During this encounter critical care time was devoted to patient care services described in this note for 33 minutes.  Garner Nash, DO [facility] 01/18/2021 8:42 AM

## 2021-01-05 NOTE — Progress Notes (Signed)
Pt time of death 1742. Family at bedside.

## 2021-01-05 NOTE — Progress Notes (Signed)
[address] notified of TOD. Previous referral # [phone]

## 2021-01-05 NOTE — Progress Notes (Signed)
eLink Physician-Brief Progress Note Patient Name: Gary [name] DOB: 1941/02/16 MRN: [id number]   Date of Service  01/25/21  HPI/Events of Note  Oliguria - Persists post fluid bolus.   eICU Interventions  Plan: 1.Bolus with 0.9 NaCl 500 mL IV over 30 minutes now.      Intervention Category Major Interventions: Other:  Lysle Dingwall Jan 25, 2021, 3:36 AM

## 2021-01-05 NOTE — Plan of Care (Signed)
PCCM Goals of Care Discussion and Advanced Care Planning:   Date: 02-02-21   Present Parties: son at bedside   What was discussed: We discussed his current medical condition and ongoing severe neurologic injury. The patient son brought the patients legal documents and end of life wishes to the hospital. It states that he would not want this or not want prolonged support in the setting of a terminal injury.  Outcome:  DNR Plans for comfort care withdrawal Palliative care consultation for symptom management   18 mins of time was spent discussing the goals of care, advanced care planning options such as code status as well as do not resuscitate forms. [phone])

## 2021-01-05 NOTE — Progress Notes (Signed)
Unhooked EEG by nurses.

## 2021-01-05 NOTE — Procedures (Signed)
Extubation Procedure Note  Patient Details:   Name: Gary [name] DOB: 07/13/1941 MRN: [id number]   Airway Documentation:    Vent end date: 01/11/2021 Vent end time: 1700   Evaluation  Patient extubated per comfort care measures. RN at bedside.  Herbie Saxon Depriest 01-11-2021, 5:17 PM

## 2021-01-05 NOTE — Death Summary Note (Signed)
DEATH SUMMARY   Patient Details  Name: Gary [name] MRN: [id number] DOB: 1940-12-27  Admission/Discharge Information   Admit Date:  01-06-2021  Date of Death: Date of Death: 2021/01/08  Time of Death: Time of Death: January 19, 1741  Length of Stay: 1  Referring Physician: Deland Pretty, MD   Reason(s) for Hospitalization  80 year old male who apparently lives alone but is quite functional.  Unknown downtime but apparently son spoke to him 1 day before admission on 12/31/2020.  Neighbor saw door was locked at 6.54pm 01-06-2021 but an hour later 7.50pm door was open and was found slumped over a rocking chair unknown downtime but possibly max 60 minutes.  PEA upon EMS arrival.  CPR x3 with epi x3.  No shock.  Upon arrival into the ER was hypotensive.  Significant evidence of trauma to the sternum and bilateral ribs and also L3 and T8 spine with evidence of sinusitis and new left upper lobe mass 2.3 cm groundglass opacity and bruising in the area of the abdomen where he was slumped over.  No history of trauma or fall available.  His blood pressure improved upon EDP placing right-sided pigtail chest tube.  Largely unresponsive except did have a gag reflex when ER nurse placed OG tube.  CCM called for admission.  Son is aware of the admission and talking to family about CODE STATUS.  Labs on admission showed possible AKI and anemia and hyperkalemia  Diagnoses  Preliminary cause of death: Cardiac arrest ([place]) Secondary Diagnoses (including complications and co-morbidities):  Principal Problem:   PEA (Pulseless electrical activity) ([place]) Active Problems:   Limited code status, all resuscitation measures excluding chest compressions   Poor prognosis   Goals of care, counseling/discussion   AKI (acute kidney injury) ([place])   Acute respiratory failure ([facility])   Pneumothorax, right   Cardiac arrest [facility])   Brief Hospital Course (including significant findings, care, treatment, and services provided and events  leading to death)  Gary [name] is a 80 y.o. year old male who apparently lives alone but is quite functional.  Unknown downtime but apparently son spoke to him 1 day before admission on 12/31/2020.  Neighbor saw door was locked at 6.54pm 2021-01-06 but an hour later 7.50pm door was open and was found slumped over a rocking chair unknown downtime but possibly max 60 minutes.  PEA upon EMS arrival.  CPR x3 with epi x3.  No shock.  Upon arrival into the ER was hypotensive.  Significant evidence of trauma to the sternum and bilateral ribs and also L3 and T8 spine with evidence of sinusitis and new left upper lobe mass 2.3 cm groundglass opacity and bruising in the area of the abdomen where he was slumped over.  No history of trauma or fall available.  His blood pressure improved upon EDP placing right-sided pigtail chest tube.  Largely unresponsive except did have a gag reflex when ER nurse placed OG tube.  CCM called for admission.  Son is aware of the admission and talking to family about CODE STATUS.  Labs on admission showed possible AKI and anemia and hyperkalemia  He appears to have a history of rheumatoid arthritis, radiation therapy, coronary artery disease with status post angioplasty in Jan 20, 2016 by Dr. Einar Gip  Acute respiratory failure in the setting of PEA arrest 01/06/21  Post CPR discovery of large right tension pneumothorax associated with bilateral rib fractures and sternal fractures -status post pigtail tube thoracostomy by EDP  Bilateral lower lobe consolidation upon arrival to the  ER 12/25/2020 raising concern for infectious pneumonia or aspiration  Left upper lobe mass 1.2 cm groundglass opacity at admission 12/31/2020 - RA nodule v lung cancer P:   Remains on full vent support Adult mechanical vent protocol VAP prophylaxis PAD sedation guidelines Can on continuous benzodiazepines due to status epilepticus  Acute metabolic encephalopathy secondary to above Status epilepticus,  myoclonic seizures Concern for anoxic brain injury postarrest P:   Continuous LTV EEG per neurology AEDs per neurology On continuous benzodiazepines and loaded with phenobarbital. Overall prognosis postarrest with these kind of findings is exceptionally poor. I have discussed with neurology this morning. Be helpful if they would reach out to patient's family to discuss findings.  Post cardiac arrest circulatory shock resolved with fluids and right-sided chest tube P:  Mean arterial pressure greater than 65 Pressors as needed Higher likelihood of developing hypotension post phenobarbital loading  PEA arrest 12/22/2020 -primary indication for admission History of coronary artery disease status post angioplasty in 2017 by Dr. Einar Gip P: Troponins Echo pending   Covid negative.  CT chest with bilateral lower lobe consolidation and CT sinus with evidence of sinusitis P:   Continue Unasyn  Home ace inhibitor intake CKD with a baseline creatinine of 1.5/1.75 in the summer 2020 AKI creatinine 2.42 mg percent versus progressive CKD at admission 12/11/2020 P: Holding ACE inhibitor Follow urine output Was given a fluid bolus last night  Severe metabolic acidosis at admission likely due to CPR Plan Follow urine output   Hyperkalemia P: Correct electrolytes as needed  At risk for stress ulcer bleed Transaminitis - likely shock liver PPI  On home Xarelto Anemia on admission P:  Conservative transfusion threshold for hemoglobin less than 7  At risk for hypo and hyperglycemia Chronic steroids at home P:   CBG with SSI  Hx of rheumatoid arthritis - Chronic pred Hx of pseudogout Sternal fracture, L3 fracture, T8 fracture and bilateral rib fracture -?  Due to CPR   Patient was found to be in status epilepticus with anoxic brain injury. The family decided to withdraw care. The patient was transitioned to comfort measures.    Pertinent Labs and Studies  Significant  Diagnostic Studies CT Head Wo Contrast  Result Date: 12/17/2020 CLINICAL DATA:  Change in mental status cardiac arrest EXAM: CT HEAD WITHOUT CONTRAST TECHNIQUE: Contiguous axial images were obtained from the base of the skull through the vertex without intravenous contrast. COMPARISON:  MRI October 04, 2017 FINDINGS: Brain: No evidence of acute territorial infarction, hemorrhage, hydrocephalus,extra-axial collection or mass lesion/mass effect. There is dilatation the ventricles and sulci consistent with age-related atrophy. Low-attenuation changes in the deep white matter consistent with small vessel ischemia. Vascular: No hyperdense vessel or unexpected calcification. Skull: The skull is intact. No fracture or focal lesion identified. Sinuses/Orbits: There is near complete opacification of the right maxillary sinus and sphenoid sinus with remodeling the maxillary sinus with cortical breakthrough into the ethmoid air cells hyperdense rounded area of masslike secretions/debris as on prior MRI. The orbits and globes intact. Other: None Cervical spine: Alignment: Physiologic Skull base and vertebrae: Visualized skull base is intact. No atlanto-occipital dissociation. The vertebral body heights are well maintained. No fracture or pathologic osseous lesion seen. Soft tissues and spinal canal: The visualized paraspinal soft tissues are unremarkable. No prevertebral soft tissue swelling is seen. The spinal canal is grossly unremarkable, no large epidural collection or significant canal narrowing. Partially visualized ET tube is seen. There is also debris seen within the upper oropharynx. Disc  levels: Multilevel cervical spine spondylosis seen with disc osteophyte complex and uncovertebral osteophytes most notable at C3-C4 and C4-C5 with mild narrowing of the central canal and right neural foraminal narrowing. Upper chest: A right apical pneumothorax is present. Subpleural bleb formation seen within the bilateral lung  apices. Other: None IMPRESSION: 1. No acute intracranial abnormality. 2. Findings consistent with age related atrophy and chronic small vessel ischemia 3.  No acute fracture or malalignment of the spine. 4. Chronic sinusitis with debris/masslike area within the right maxillary sinus and sphenoid sinus 5. Right apical pneumothorax, better evaluated on CT chest same day Electronically Signed   By: Prudencio Pair M.D.   On: 12/20/2020 22:08   CT Cervical Spine Wo Contrast  Result Date: 12/14/2020 CLINICAL DATA:  Change in mental status cardiac arrest EXAM: CT HEAD WITHOUT CONTRAST TECHNIQUE: Contiguous axial images were obtained from the base of the skull through the vertex without intravenous contrast. COMPARISON:  MRI October 04, 2017 FINDINGS: Brain: No evidence of acute territorial infarction, hemorrhage, hydrocephalus,extra-axial collection or mass lesion/mass effect. There is dilatation the ventricles and sulci consistent with age-related atrophy. Low-attenuation changes in the deep white matter consistent with small vessel ischemia. Vascular: No hyperdense vessel or unexpected calcification. Skull: The skull is intact. No fracture or focal lesion identified. Sinuses/Orbits: There is near complete opacification of the right maxillary sinus and sphenoid sinus with remodeling the maxillary sinus with cortical breakthrough into the ethmoid air cells hyperdense rounded area of masslike secretions/debris as on prior MRI. The orbits and globes intact. Other: None Cervical spine: Alignment: Physiologic Skull base and vertebrae: Visualized skull base is intact. No atlanto-occipital dissociation. The vertebral body heights are well maintained. No fracture or pathologic osseous lesion seen. Soft tissues and spinal canal: The visualized paraspinal soft tissues are unremarkable. No prevertebral soft tissue swelling is seen. The spinal canal is grossly unremarkable, no large epidural collection or significant canal  narrowing. Partially visualized ET tube is seen. There is also debris seen within the upper oropharynx. Disc levels: Multilevel cervical spine spondylosis seen with disc osteophyte complex and uncovertebral osteophytes most notable at C3-C4 and C4-C5 with mild narrowing of the central canal and right neural foraminal narrowing. Upper chest: A right apical pneumothorax is present. Subpleural bleb formation seen within the bilateral lung apices. Other: None IMPRESSION: 1. No acute intracranial abnormality. 2. Findings consistent with age related atrophy and chronic small vessel ischemia 3.  No acute fracture or malalignment of the spine. 4. Chronic sinusitis with debris/masslike area within the right maxillary sinus and sphenoid sinus 5. Right apical pneumothorax, better evaluated on CT chest same day Electronically Signed   By: Prudencio Pair M.D.   On: 12/28/2020 22:08   DG Pelvis Portable  Result Date: 12/21/2020 CLINICAL DATA:  Post arrest cardiac. Found slumped over and rocking chair. EXAM: PORTABLE PELVIS 1-2 VIEWS COMPARISON:  X-ray hip 12/22/2012 FINDINGS: Similar appearance of 3 surgical screws status post fixation of a left femoral head fracture. There is no evidence of pelvic fracture or diastasis. No pelvic bone lesions are seen. Degenerative changes of the visualized lower lumbar spine. Limited evaluation of the sacrum due to overlying soft tissues and bowel. Similar-appearing slight sclerosis of the right sacroiliac joint. Foley temperature probe overlying the pelvis. IMPRESSION: Negative. Electronically Signed   By: Iven Finn M.D.   On: 12/22/2020 21:30   CT CHEST ABDOMEN PELVIS W CONTRAST  Result Date: 12/18/2020 CLINICAL DATA:  cardiac arrest. Pt lives alone, neighbor found slumped over  a rocking chair EXAM: CT CHEST, ABDOMEN, AND PELVIS WITH CONTRAST TECHNIQUE: Multidetector CT imaging of the chest, abdomen and pelvis was performed following the standard protocol during bolus  administration of intravenous contrast. CONTRAST:  164mL OMNIPAQUE IOHEXOL 300 MG/ML  SOLN COMPARISON:  CT angio chest, abdomen, pelvis 03/19/2019. FINDINGS: Ports and Devices: Endotracheal tube terminates 3.5 cm above the carina. Foley catheter terminates within the urinary bladder lumen with several associated intraluminal foci of gas likely due to instrumentation. CHEST: Lungs/airways: Bilateral centrilobular and and paraseptal emphysematous changes. Bilateral lower lobe dependent lower lobe consolidations/atelectasis. There is a poorly defined 2.3 cm ground-glass left upper lobe pulmonary nodule (5:65-69). A 1.6 cm left upper lobe subpleural ground-glass pulmonary nodule (5:44). Suggestion of an associated less conspicuous logical within the left upper lobe more superiorly (5:37). There is a stable 0.8 x 0.4 cm pulmonary nodule within the right upper lobe (5:57). Other previously identified right upper lobe nodules are poorly visualized likely due to collapse the lung. Right lung calcified granuloma. No pulmonary laceration.  No pneumatocele formation. The central airways are patent. Pleura: No pleural effusion. Moderate to large volume right pneumothorax. No left pneumothorax. No hemothorax. Lymph Nodes: Calcified right hilar lymph nodes. No mediastinal, hilar, or axillary lymphadenopathy. Mediastinum: No pneumomediastinum. No aortic injury or mediastinal hematoma. The thoracic aorta is normal in caliber. Atherosclerotic plaque. Coronary artery calcifications. The heart is normal in size. No significant pericardial effusion. The esophagus is unremarkable. The thyroid is not visualized. Chest Wall / Breasts: No chest wall mass. Musculoskeletal: Nondisplaced acute fracture of the sternum in a couple different locations anteriorly (7:82). Acute minimally displaced (fifth rib is full shaft with displaced) right rib fractures involving the anterolateral third through eighth ribs. Acute minimally displaced (sixth rib  is displaced) left rib fractures involving the anterolateral third through ninth ribs. Please see separately dictated CT thoracic spine 12/29/2020. ABDOMEN / PELVIS: Liver: Not enlarged. No focal lesion. No laceration or subcapsular hematoma. Biliary System: The gallbladder is otherwise unremarkable with no radio-opaque gallstones. No biliary ductal dilatation. Pancreas: Normal pancreatic contour. No main pancreatic duct dilatation. Spleen: Not enlarged. No focal lesion. No laceration, subcapsular hematoma, or vascular injury. Adrenal Glands: No nodularity bilaterally. Kidneys: Bilateral kidneys enhance symmetrically. No hydronephrosis. No contusion, laceration, or subcapsular hematoma. No injury to the vascular structures or collecting systems. No hydroureter. The urinary bladder is unremarkable. Bowel: Fluid within the lumen of the large bowel is noted. No pneumatosis. No small or large bowel wall thickening or dilatation. The appendix is unremarkable. Mesentery, Omentum, and Peritoneum: No simple free fluid ascites. No pneumoperitoneum. No hemoperitoneum. No mesenteric hematoma identified. No organized fluid collection. Pelvic Organs: Normal. Lymph Nodes: No abdominal, pelvic, inguinal lymphadenopathy. Vasculature: Atherosclerotic plaque. No abdominal aorta or iliac aneurysm. No active contrast extravasation or pseudoaneurysm. Musculoskeletal: No significant soft tissue hematoma. No acute pelvic fracture. Fixation of the left femoral neck with 3 screws. Please see separately dictated CT lumbar spine 12/20/2020. IMPRESSION: 1. Moderate to large volume right pneumothorax. Per chest x-ray 12/26/2020, findings reported to team at 9:30 p.m. 2. Likely bilateral lower lobe aspiration pneumonia. 3. Interval development of a 1.5 cm subpleural and a 2.3 cm ground-glass left upper lobe pulmonary nodules. Adenocarcinoma not excluded. Additional imaging evaluation or consultation with Pulmonology or Thoracic Surgery  recommended. 4. Associated acute minimally displaced (fifth rib is full shaft with displaced) right rib fractures involving the anterolateral third through eighth ribs. 5. Acute minimally displaced (sixth rib is displaced) left rib fractures involving the  anterolateral third through ninth ribs. No definite left pneumothorax. 6. Acute sternal fracture. 7. Fast transition state of the large bowel. 8. Please see separately dictated CT thoracic and lumbar spine 12/23/2020. Electronically Signed   By: Iven Finn M.D.   On: 12/27/2020 22:23   CT T-SPINE NO CHARGE  Result Date: 12/31/2020 CLINICAL DATA:  Possible fall with back trauma. EXAM: CT THORACIC AND LUMBAR SPINE WITHOUT CONTRAST TECHNIQUE: Multidetector CT imaging of the thoracic and lumbar spine was performed without contrast. Multiplanar CT image reconstructions were also generated. COMPARISON:  CT chest 07/03/2019 FINDINGS: CT THORACIC SPINE FINDINGS Alignment: Mild scoliotic curvature convex to the right. No antero or retrolisthesis. Vertebrae: Acute inferior endplate fracture at T8 with loss of height of about 20%. No retropulsed bone. No other acute fracture in the region. Mild chronic loss of height at the inferior endplate regions of T6 and T7. Paraspinal and other soft tissues: See results of chest CT. Disc levels: No significant disc space pathology. No stenosis of the canal or foramina. Ordinary mild facet osteoarthritis. CT LUMBAR SPINE FINDINGS Segmentation: S1 is transitional, largely lumbarized. Alignment: Normal Vertebrae: Acute superior endplate fracture L3 with loss of height of 20%. Mild posterior bowing of the posterosuperior margin of the vertebral body. No compressive encroachment upon the canal. No other lumbosacral injury identified, through S3. Paraspinal and other soft tissues: Aortic atherosclerosis. No other retroperitoneal finding. Disc levels: L1-2: Minimal noncompressive disc bulge. L2-3: Mild bulging of the disc. Mild  posterior bowing of the posterosuperior margin of L3 related to the fracture. No apparent compressive stenosis. L3-4: Mild bulging of the disc. Mild bilateral lateral recess narrowing. L4-5: Mild bulging of the disc. Mild bilateral lateral recess narrowing. L5-S1: Bulging of the disc. Facet hypertrophy. Mild to moderate stenosis of both lateral recesses. S1-2: Transitional level. Facet hypertrophy. Mild narrowing of the subarticular lateral recess on the right because of osteophytic encroachment. IMPRESSION: 1. CT THORACIC SPINE IMPRESSION Acute inferior endplate fracture at T8 with loss of height of about 20%. No retropulsed bone. 2. CT LUMBAR SPINE IMPRESSION Acute superior endplate fracture at L3 with loss of height of 20%. Mild posterior bowing of the posterosuperior margin of L3 related to the fracture. No compressive encroachment upon the canal. No other lumbosacral injury identified, through S3. 3. Other lumbar degenerative changes as outlined above. Aortic Atherosclerosis (ICD10-I70.0). Electronically Signed   By: Nelson Chimes M.D.   On: 12/21/2020 22:05   CT L-SPINE NO CHARGE  Result Date: 12/25/2020 CLINICAL DATA:  Possible fall with back trauma. EXAM: CT THORACIC AND LUMBAR SPINE WITHOUT CONTRAST TECHNIQUE: Multidetector CT imaging of the thoracic and lumbar spine was performed without contrast. Multiplanar CT image reconstructions were also generated. COMPARISON:  CT chest 07/03/2019 FINDINGS: CT THORACIC SPINE FINDINGS Alignment: Mild scoliotic curvature convex to the right. No antero or retrolisthesis. Vertebrae: Acute inferior endplate fracture at T8 with loss of height of about 20%. No retropulsed bone. No other acute fracture in the region. Mild chronic loss of height at the inferior endplate regions of T6 and T7. Paraspinal and other soft tissues: See results of chest CT. Disc levels: No significant disc space pathology. No stenosis of the canal or foramina. Ordinary mild facet osteoarthritis.  CT LUMBAR SPINE FINDINGS Segmentation: S1 is transitional, largely lumbarized. Alignment: Normal Vertebrae: Acute superior endplate fracture L3 with loss of height of 20%. Mild posterior bowing of the posterosuperior margin of the vertebral body. No compressive encroachment upon the canal. No other lumbosacral injury identified, through  S3. Paraspinal and other soft tissues: Aortic atherosclerosis. No other retroperitoneal finding. Disc levels: L1-2: Minimal noncompressive disc bulge. L2-3: Mild bulging of the disc. Mild posterior bowing of the posterosuperior margin of L3 related to the fracture. No apparent compressive stenosis. L3-4: Mild bulging of the disc. Mild bilateral lateral recess narrowing. L4-5: Mild bulging of the disc. Mild bilateral lateral recess narrowing. L5-S1: Bulging of the disc. Facet hypertrophy. Mild to moderate stenosis of both lateral recesses. S1-2: Transitional level. Facet hypertrophy. Mild narrowing of the subarticular lateral recess on the right because of osteophytic encroachment. IMPRESSION: 1. CT THORACIC SPINE IMPRESSION Acute inferior endplate fracture at T8 with loss of height of about 20%. No retropulsed bone. 2. CT LUMBAR SPINE IMPRESSION Acute superior endplate fracture at L3 with loss of height of 20%. Mild posterior bowing of the posterosuperior margin of L3 related to the fracture. No compressive encroachment upon the canal. No other lumbosacral injury identified, through S3. 3. Other lumbar degenerative changes as outlined above. Aortic Atherosclerosis (ICD10-I70.0). Electronically Signed   By: Nelson Chimes M.D.   On: 12/15/2020 22:05   DG Chest Port 1 View  Result Date: 01/02/2021 CLINICAL DATA:  80 year old male with history of acute on chronic respiratory failure. EXAM: PORTABLE CHEST 1 VIEW COMPARISON:  Chest x-ray 12/08/2020. FINDINGS: An endotracheal tube is in place with tip 5.0 cm above the carina. Right sided small bore pigtail drainage catheter with tip  currently positioned over the upper right hemithorax. Trace amount of residual right pneumothorax most evident in the base of the right hemithorax (less than 5% of the volume of the right hemithorax), decreased compared to the prior examination. Trace amount of gas in the deep soft tissues of the right chest wall. Emphysematous changes are noted in the lungs. Mild linear scarring or subsegmental atelectasis at the left lung base. Lungs are otherwise clear. No pleural effusions. No evidence of pulmonary edema. Heart size is borderline enlarged. The patient is rotated to the left on today's exam, resulting in distortion of the mediastinal contours and reduced diagnostic sensitivity and specificity for mediastinal pathology. IMPRESSION: 1. Support apparatus, as above. 2. Decreased size of right-sided pneumothorax, as above. 3. Emphysema. Electronically Signed   By: Vinnie Langton M.D.   On: 01/02/2021 08:07   DG Chest Portable 1 View  Result Date: 12/23/2020 CLINICAL DATA:  Post chest tube insertion EXAM: PORTABLE CHEST 1 VIEW COMPARISON:  12/17/2020, 03/19/2019 FINDINGS: Interval insertion of right-sided chest tube with pigtail over the medial mid right thorax. Decreased size of right pneumothorax with trace residual at the apex and probable residual pneumothorax at the right mid to lower lung. Suspected deep sulcus on the right consistent with residual pneumothorax. Endotracheal tube tip about 4.6 cm superior to the carina. Left lung grossly clear. Multiple right rib fractures. IMPRESSION: 1. Interval placement of right-sided chest tube with decreased size of right pneumothorax. Small residual pneumothorax at the right apex and right mid to lower lung and right CP angle. 2. Endotracheal tube tip about 4.6 cm superior to the carina. Electronically Signed   By: Donavan Foil M.D.   On: 12/15/2020 23:20   DG Chest Port 1 View  Result Date: 12/25/2020 CLINICAL DATA:  Status post cardiac arrest. EXAM: PORTABLE  CHEST 1 VIEW COMPARISON:  Mar 26, 2013. FINDINGS: The heart size and mediastinal contours are within normal limits. Endotracheal tube is in good position. Left lung is clear. Moderate size right apical and basilar pneumothorax is noted. No pleural effusion is  noted. The visualized skeletal structures are unremarkable. IMPRESSION: Moderate size right apical and basilar pneumothorax. Critical Value/emergent results were called by telephone at the time of interpretation on 12/28/2020 at 9:30 pm to provider Eliott Nine, RN, who verbally acknowledged these results. Electronically Signed   By: Marijo Conception M.D.   On: 12/09/2020 21:30   Overnight EEG with video  Result Date: Jan 08, 2021 Lora Havens, MD     01/04/2021  8:24 AM Patient Name: Gary [name] MRN: [id number] Epilepsy Attending: Lora Havens Referring Physician/Provider: Dr Zeb Comfort Duration: 01/02/2021 1854 to 01-08-21 1645  Patient history: 80yo M s/p cardiac arrest. EEG to evaluate for seizure  Level of alertness:  comatose  AEDs during EEG study: Propofol, versed, LEV, PB  Technical aspects: This EEG study was done with scalp electrodes positioned according to the 10-20 International system of electrode placement. Electrical activity was acquired at a sampling rate of 500Hz  and reviewed with a high frequency filter of 70Hz  and a low frequency filter of 1Hz . EEG data were recorded continuously and digitally stored.  Description: EEG showed showed bursts suppression pattern with burst of generalized sharply contoured 3-5hz  theta-delta slowing admixed with polyspikes, every 2-4 minutes, lasting 1-2 seconds. At times patient was noted to have brief sudden eye opening during bursts consistent with myoclonic seizure. After around 0800 on 2021-01-08, EEG showed predominantly generalized background suppression. EEG was not reactive to noxious stimulation.  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  Lora Havens   ECHOCARDIOGRAM COMPLETE  Result Date: 01/02/2021    ECHOCARDIOGRAM REPORT   Patient Name:   Gary [name] Date of Exam: 01/02/2021 Medical Rec #:  [id number]      Height:       70.0 in Accession #:    [id number]     Weight:       160.5 lb Date of Birth:  09/09/1941       BSA:          1.901 m Patient Age:    69 years       BP:           113/80 mmHg Patient Gender: M              HR:           78 bpm. Exam Location:  Inpatient Procedure: 2D Echo Indications:    cardiac arrest  History:        Patient has prior history of Echocardiogram examinations, most                 recent 12/24/2012. CAD, COPD and chronic kidney disease; Risk                 Factors:Dyslipidemia and Sleep Apnea.  Sonographer:    Johny Chess Referring Phys: 50 MURALI RAMASWAMY  Sonographer Comments: Echo performed with patient supine and on artificial respirator. IMPRESSIONS  1. Left ventricular ejection fraction, by estimation, is 55 to 60%. The left ventricle has normal function. The left ventricle has no regional wall motion abnormalities. Left ventricular diastolic parameters are indeterminate.  2. Right ventricular systolic function is normal. The right ventricular size is normal.  3. The mitral valve is normal in structure. Trivial mitral valve regurgitation. No evidence of mitral stenosis.  4. The aortic valve is tricuspid. Aortic valve regurgitation is not visualized. No aortic stenosis is present.  5. The inferior vena cava is normal in size with greater than 50% respiratory variability, suggesting right atrial pressure of 3 mmHg. FINDINGS  Left Ventricle: Left ventricular ejection fraction, by estimation, is 55 to 60%. The left ventricle has normal function. The left ventricle has no regional wall motion  abnormalities. The left ventricular internal cavity size was normal in size. There is  no left ventricular hypertrophy. Left ventricular diastolic parameters are indeterminate. Right Ventricle: The right ventricular size is normal. Right ventricular systolic function is normal. Left Atrium: Left atrial size was normal in size. Right Atrium: Right atrial size was normal in size. Pericardium: There is no evidence of pericardial effusion. Mitral Valve: The mitral valve is normal in structure. Trivial mitral valve regurgitation. No evidence of mitral valve stenosis. Tricuspid Valve: The tricuspid valve is normal in structure. Tricuspid valve regurgitation is trivial. No evidence of tricuspid stenosis. Aortic Valve: The aortic valve is tricuspid. Aortic valve regurgitation is not visualized. No aortic stenosis is present. Pulmonic Valve: The pulmonic valve was not well visualized. Pulmonic valve regurgitation is not visualized. No evidence of pulmonic stenosis. Aorta: The aortic root is normal in size and structure. Venous: The inferior vena cava is normal in size with greater than 50% respiratory variability, suggesting right atrial pressure of 3 mmHg.  LEFT VENTRICLE PLAX 2D LVIDd:         3.60 cm     Diastology LVIDs:         3.00 cm     LV e' medial:  7.94 cm/s LV PW:         1.10 cm     LV e' lateral: 10.00 cm/s LV IVS:        0.90 cm LVOT diam:     1.80 cm LV SV:         32 LV SV Index:   17 LVOT Area:     2.54 cm  LV Volumes (MOD) LV vol d, MOD A4C: 43.2 ml LV vol s, MOD A4C: 25.5 ml LV SV MOD A4C:     43.2 ml RIGHT VENTRICLE             IVC RV S prime:     10.20 cm/s  IVC diam: 1.40 cm TAPSE (M-mode): 2.0 cm LEFT ATRIUM           Index       RIGHT ATRIUM           Index LA diam:      3.30 cm 1.74 cm/m  RA Area:     15.20 cm LA Vol (A2C): 37.5 ml 19.73 ml/m RA Volume:   35.50 ml  18.67 ml/m LA Vol (A4C): 44.1 ml 23.20 ml/m  AORTIC VALVE LVOT Vmax:   84.20 cm/s LVOT Vmean:  50.200 cm/s LVOT VTI:    0.125 m   AORTA Ao Root diam: 3.40 cm  SHUNTS Systemic VTI:  0.12 m Systemic Diam: 1.80 cm Kirk Ruths MD Electronically signed by Kirk Ruths MD Signature Date/Time: 01/02/2021/2:56:55 PM    Final     Microbiology Recent Results (from the past 240 hour(s))  Resp Panel by RT-PCR (Flu A&B, Covid) Nasopharyngeal Swab     Status: None   Collection Time: 12/19/2020  9:07 PM   Specimen: Nasopharyngeal Swab; Nasopharyngeal(NP) swabs in vial transport medium  Result Value Ref Range Status   SARS Coronavirus 2 by  RT PCR NEGATIVE NEGATIVE Final    Comment: (NOTE) SARS-CoV-2 target nucleic acids are NOT DETECTED.  The SARS-CoV-2 RNA is generally detectable in upper respiratory specimens during the acute phase of infection. The lowest concentration of SARS-CoV-2 viral copies this assay can detect is 138 copies/mL. A negative result does not preclude SARS-Cov-2 infection and should not be used as the sole basis for treatment or other patient management decisions. A negative result may occur with  improper specimen collection/handling, submission of specimen other than nasopharyngeal swab, presence of viral mutation(s) within the areas targeted by this assay, and inadequate number of viral copies(<138 copies/mL). A negative result must be combined with clinical observations, patient history, and epidemiological information. The expected result is Negative.  Fact Sheet for Patients:  EntrepreneurPulse.com.au  Fact Sheet for Healthcare Providers:  IncredibleEmployment.be  This test is no t yet approved or cleared by the [place] FDA and  has been authorized for detection and/or diagnosis of SARS-CoV-2 by FDA under an Emergency Use Authorization (EUA). This EUA will remain  in effect (meaning this test can be used) for the duration of the COVID-19 declaration under Section 564(b)(1) of the Act, 21 U.S.C.section 360bbb-3(b)(1), unless the authorization is  terminated  or revoked sooner.       Influenza A by PCR NEGATIVE NEGATIVE Final   Influenza B by PCR NEGATIVE NEGATIVE Final    Comment: (NOTE) The Xpert Xpress SARS-CoV-2/FLU/RSV plus assay is intended as an aid in the diagnosis of influenza from Nasopharyngeal swab specimens and should not be used as a sole basis for treatment. Nasal washings and aspirates are unacceptable for Xpert Xpress SARS-CoV-2/FLU/RSV testing.  Fact Sheet for Patients: EntrepreneurPulse.com.au  Fact Sheet for Healthcare Providers: IncredibleEmployment.be  This test is not yet approved or cleared by the [place] FDA and has been authorized for detection and/or diagnosis of SARS-CoV-2 by FDA under an Emergency Use Authorization (EUA). This EUA will remain in effect (meaning this test can be used) for the duration of the COVID-19 declaration under Section 564(b)(1) of the Act, 21 U.S.C. section 360bbb-3(b)(1), unless the authorization is terminated or revoked.  Performed at [facility], [place] [address], [place], [place] [address]   Culture, blood (routine x 2)     Status: None (Preliminary result)   Collection Time: 12/24/2020  9:08 PM   Specimen: BLOOD RIGHT WRIST  Result Value Ref Range Status   Specimen Description BLOOD RIGHT WRIST  Final   Special Requests   Final    BOTTLES DRAWN AEROBIC AND ANAEROBIC Blood Culture adequate volume   Culture   Final    NO GROWTH 3 DAYS Performed at [facility], [place] [address], [place], [place] [address]    Report Status PENDING  Incomplete  Culture, blood (routine x 2)     Status: None (Preliminary result)   Collection Time: 12/22/2020  9:50 PM   Specimen: BLOOD  Result Value Ref Range Status   Specimen Description BLOOD RIGHT ANTECUBITAL  Final   Special Requests   Final    BOTTLES DRAWN AEROBIC AND ANAEROBIC Blood Culture adequate volume   Culture   Final    NO GROWTH 3 DAYS Performed at [facility], [place] [address], [place],  [address]    Report Status PENDING  Incomplete  MRSA PCR Screening     Status: None   Collection Time: 01/02/21  1:11 AM   Specimen: Nasopharyngeal  Result Value Ref Range Status  MRSA by PCR NEGATIVE NEGATIVE Final    Comment:        The GeneXpert MRSA Assay (FDA approved for NASAL specimens only), is one component of a comprehensive MRSA colonization surveillance program. It is not intended to diagnose MRSA infection nor to guide or monitor treatment for MRSA infections. Performed at [facility], [place] [address] [place], [place] [address]     Lab Basic Metabolic Panel: Recent Labs  Lab 12/08/2020 2104 12/15/2020 2149 01/02/21 0041 01/02/21 0157 01/02/21 0213 01/02/21 1215 2021/01/12 0228  NA 138   < > 139 140 141 141 141  K 5.2*   < > 3.2* 2.7* 3.0* 3.0* 4.3  CL 100  --  103  --  105 107 111  CO2 11*  --  14*  --  15* 17* 17*  GLUCOSE 253*  --  218*  --  222* 179* 105*  BUN 18  --  17  --  18 19 23   CREATININE 2.42*  --  2.05*  --  2.01* 2.06* 2.34*  CALCIUM 9.1  --  8.3*  --  8.5* 8.7* 8.4*  MG  --   --   --   --  1.6*  --  1.8  PHOS  --   --   --   --  3.6  --   --    < > = values in this interval not displayed.   Liver Function Tests: Recent Labs  Lab 12/16/2020 2104  AST 1,022*  ALT 374*  ALKPHOS 94  BILITOT 2.2*  PROT 6.5  ALBUMIN 2.4*   No results for input(s): LIPASE, AMYLASE in the last 168 hours. No results for input(s): AMMONIA in the last 168 hours. CBC: Recent Labs  Lab 12/29/2020 2104 12/19/2020 2149 01/02/21 0157 01/02/21 0213 January 12, 2021 0228  WBC 16.5*  --   --  12.9* 9.8  NEUTROABS 8.3*  --   --   --   --   HGB 12.9* 9.5* 11.6* 12.9* 11.7*  HCT 42.7 28.0* 34.0* 39.4 33.0*  MCV 109.8*  --   --  102.3* 95.9  PLT 424*  --   --  270 204   Cardiac Enzymes: No results for input(s): CKTOTAL, CKMB, CKMBINDEX, TROPONINI in the last 168 hours. Sepsis Labs: Recent Labs  Lab 12/22/2020 2104  12/18/2020 2315 01/02/21 0213 01/12/2021 0228  WBC 16.5*  --  12.9* 9.8  LATICACIDVEN >11.0* >11.0*  --   --     Procedures/Operations  TTM    Nel Stoneking L Janellie Tennison 01/04/2021, 6:23 PM

## 2021-01-05 DEATH — deceased

## 2021-01-06 LAB — CULTURE, BLOOD (ROUTINE X 2)
Culture: NO GROWTH
Culture: NO GROWTH
Special Requests: ADEQUATE
Special Requests: ADEQUATE

## 2024-02-20 ENCOUNTER — Telehealth: Payer: Self-pay | Admitting: Cardiology
